# Patient Record
Sex: Female | Born: 1965
Health system: Southern US, Community
[De-identification: ages and names within clinical notes are randomized; demographics above are authoritative.]

## PROBLEM LIST (undated history)

## (undated) DIAGNOSIS — F419 Anxiety disorder, unspecified: Secondary | ICD-10-CM

## (undated) DIAGNOSIS — G4710 Hypersomnia, unspecified: Secondary | ICD-10-CM

## (undated) DIAGNOSIS — M1990 Unspecified osteoarthritis, unspecified site: Secondary | ICD-10-CM

## (undated) DIAGNOSIS — G4733 Obstructive sleep apnea (adult) (pediatric): Secondary | ICD-10-CM

## (undated) DIAGNOSIS — A6920 Lyme disease, unspecified: Secondary | ICD-10-CM

## (undated) DIAGNOSIS — E66813 Obesity, class 3: Secondary | ICD-10-CM

## (undated) DIAGNOSIS — G43909 Migraine, unspecified, not intractable, without status migrainosus: Secondary | ICD-10-CM

## (undated) DIAGNOSIS — I10 Essential (primary) hypertension: Secondary | ICD-10-CM

## (undated) DIAGNOSIS — N289 Disorder of kidney and ureter, unspecified: Secondary | ICD-10-CM

## (undated) HISTORY — DX: Lyme disease, unspecified: A69.20

## (undated) HISTORY — DX: Obstructive sleep apnea (adult) (pediatric): G47.33

## (undated) HISTORY — PX: CHOLECYSTECTOMY: SHX55

## (undated) HISTORY — PX: KNEE SURGERY: SHX244

## (undated) HISTORY — PX: ABLATION ON ENDOMETRIOSIS: SHX5787

## (undated) HISTORY — DX: Hypersomnia, unspecified: G47.10

## (undated) HISTORY — DX: Morbid (severe) obesity due to excess calories: E66.01

## (undated) HISTORY — DX: Unspecified osteoarthritis, unspecified site: M19.90

## (undated) HISTORY — PX: TUBAL LIGATION: SHX77

## (undated) HISTORY — PX: KNEE ARTHROCENTESIS: SUR44

## (undated) HISTORY — DX: Anxiety disorder, unspecified: F41.9

## (undated) HISTORY — DX: Obesity, class 3: E66.813

---

## 2000-01-18 ENCOUNTER — Encounter: Payer: Self-pay | Admitting: Emergency Medicine

## 2000-01-18 ENCOUNTER — Encounter: Payer: Self-pay | Admitting: General Surgery

## 2000-01-18 ENCOUNTER — Encounter (INDEPENDENT_AMBULATORY_CARE_PROVIDER_SITE_OTHER): Payer: Self-pay

## 2000-01-18 ENCOUNTER — Observation Stay (HOSPITAL_COMMUNITY): Admission: EM | Admit: 2000-01-18 | Discharge: 2000-01-19 | Payer: Self-pay | Admitting: Emergency Medicine

## 2000-02-19 ENCOUNTER — Encounter: Payer: Self-pay | Admitting: Emergency Medicine

## 2000-02-19 ENCOUNTER — Emergency Department (HOSPITAL_COMMUNITY): Admission: EM | Admit: 2000-02-19 | Discharge: 2000-02-19 | Payer: Self-pay | Admitting: Emergency Medicine

## 2000-02-24 ENCOUNTER — Encounter: Payer: Self-pay | Admitting: *Deleted

## 2000-02-24 ENCOUNTER — Ambulatory Visit (HOSPITAL_COMMUNITY): Admission: RE | Admit: 2000-02-24 | Discharge: 2000-02-24 | Payer: Self-pay | Admitting: *Deleted

## 2000-07-22 ENCOUNTER — Encounter: Payer: Self-pay | Admitting: Family Medicine

## 2000-07-22 ENCOUNTER — Ambulatory Visit (HOSPITAL_COMMUNITY): Admission: RE | Admit: 2000-07-22 | Discharge: 2000-07-22 | Payer: Self-pay | Admitting: Family Medicine

## 2000-08-17 ENCOUNTER — Emergency Department (HOSPITAL_COMMUNITY): Admission: EM | Admit: 2000-08-17 | Discharge: 2000-08-18 | Payer: Self-pay | Admitting: Emergency Medicine

## 2000-10-21 ENCOUNTER — Encounter: Payer: Self-pay | Admitting: Family Medicine

## 2000-10-21 ENCOUNTER — Ambulatory Visit (HOSPITAL_COMMUNITY): Admission: RE | Admit: 2000-10-21 | Discharge: 2000-10-21 | Payer: Self-pay | Admitting: Family Medicine

## 2001-10-27 ENCOUNTER — Emergency Department (HOSPITAL_COMMUNITY): Admission: EM | Admit: 2001-10-27 | Discharge: 2001-10-28 | Payer: Self-pay | Admitting: Emergency Medicine

## 2002-04-17 ENCOUNTER — Encounter: Payer: Self-pay | Admitting: Emergency Medicine

## 2002-04-17 ENCOUNTER — Emergency Department (HOSPITAL_COMMUNITY): Admission: EM | Admit: 2002-04-17 | Discharge: 2002-04-17 | Payer: Self-pay | Admitting: Emergency Medicine

## 2002-04-24 ENCOUNTER — Encounter: Payer: Self-pay | Admitting: Emergency Medicine

## 2002-04-24 ENCOUNTER — Inpatient Hospital Stay (HOSPITAL_COMMUNITY): Admission: EM | Admit: 2002-04-24 | Discharge: 2002-04-27 | Payer: Self-pay | Admitting: Emergency Medicine

## 2005-12-22 ENCOUNTER — Encounter: Admission: RE | Admit: 2005-12-22 | Discharge: 2005-12-22 | Payer: Self-pay | Admitting: Family Medicine

## 2006-06-18 ENCOUNTER — Emergency Department (HOSPITAL_COMMUNITY): Admission: EM | Admit: 2006-06-18 | Discharge: 2006-06-18 | Payer: Self-pay | Admitting: Emergency Medicine

## 2006-07-11 ENCOUNTER — Ambulatory Visit (HOSPITAL_COMMUNITY): Admission: EM | Admit: 2006-07-11 | Discharge: 2006-07-12 | Payer: Self-pay | Admitting: Obstetrics and Gynecology

## 2007-02-02 ENCOUNTER — Emergency Department (HOSPITAL_COMMUNITY): Admission: EM | Admit: 2007-02-02 | Discharge: 2007-02-02 | Payer: Self-pay | Admitting: Family Medicine

## 2007-02-13 ENCOUNTER — Emergency Department (HOSPITAL_COMMUNITY): Admission: EM | Admit: 2007-02-13 | Discharge: 2007-02-13 | Payer: Self-pay | Admitting: Family Medicine

## 2007-09-24 ENCOUNTER — Emergency Department (HOSPITAL_COMMUNITY): Admission: EM | Admit: 2007-09-24 | Discharge: 2007-09-24 | Payer: Self-pay | Admitting: Emergency Medicine

## 2007-11-04 ENCOUNTER — Emergency Department (HOSPITAL_COMMUNITY): Admission: EM | Admit: 2007-11-04 | Discharge: 2007-11-04 | Payer: Self-pay | Admitting: Family Medicine

## 2007-12-28 ENCOUNTER — Emergency Department (HOSPITAL_COMMUNITY): Admission: EM | Admit: 2007-12-28 | Discharge: 2007-12-28 | Payer: Self-pay | Admitting: Emergency Medicine

## 2008-03-06 ENCOUNTER — Emergency Department (HOSPITAL_COMMUNITY): Admission: EM | Admit: 2008-03-06 | Discharge: 2008-03-06 | Payer: Self-pay | Admitting: Emergency Medicine

## 2008-05-08 ENCOUNTER — Emergency Department (HOSPITAL_COMMUNITY): Admission: EM | Admit: 2008-05-08 | Discharge: 2008-05-08 | Payer: Self-pay | Admitting: Emergency Medicine

## 2008-07-01 ENCOUNTER — Emergency Department (HOSPITAL_COMMUNITY): Admission: EM | Admit: 2008-07-01 | Discharge: 2008-07-01 | Payer: Self-pay | Admitting: Emergency Medicine

## 2008-10-13 ENCOUNTER — Emergency Department (HOSPITAL_COMMUNITY): Admission: EM | Admit: 2008-10-13 | Discharge: 2008-10-13 | Payer: Self-pay | Admitting: Emergency Medicine

## 2008-12-14 ENCOUNTER — Emergency Department (HOSPITAL_COMMUNITY): Admission: EM | Admit: 2008-12-14 | Discharge: 2008-12-14 | Payer: Self-pay | Admitting: Emergency Medicine

## 2009-01-05 ENCOUNTER — Emergency Department (HOSPITAL_COMMUNITY): Admission: EM | Admit: 2009-01-05 | Discharge: 2009-01-06 | Payer: Self-pay | Admitting: Emergency Medicine

## 2009-06-03 ENCOUNTER — Emergency Department (HOSPITAL_BASED_OUTPATIENT_CLINIC_OR_DEPARTMENT_OTHER): Admission: EM | Admit: 2009-06-03 | Discharge: 2009-06-03 | Payer: Self-pay | Admitting: Emergency Medicine

## 2009-06-03 ENCOUNTER — Ambulatory Visit: Payer: Self-pay | Admitting: Diagnostic Radiology

## 2009-07-19 ENCOUNTER — Emergency Department (HOSPITAL_BASED_OUTPATIENT_CLINIC_OR_DEPARTMENT_OTHER): Admission: EM | Admit: 2009-07-19 | Discharge: 2009-07-19 | Payer: Self-pay | Admitting: Emergency Medicine

## 2009-08-05 ENCOUNTER — Emergency Department (HOSPITAL_COMMUNITY): Admission: EM | Admit: 2009-08-05 | Discharge: 2009-08-05 | Payer: Self-pay | Admitting: Emergency Medicine

## 2009-11-05 ENCOUNTER — Emergency Department (HOSPITAL_COMMUNITY): Admission: EM | Admit: 2009-11-05 | Discharge: 2009-11-05 | Payer: Self-pay | Admitting: Emergency Medicine

## 2010-03-28 ENCOUNTER — Emergency Department (HOSPITAL_COMMUNITY): Admission: EM | Admit: 2010-03-28 | Discharge: 2010-03-28 | Payer: Self-pay | Admitting: Emergency Medicine

## 2010-09-01 ENCOUNTER — Emergency Department (HOSPITAL_COMMUNITY)
Admission: EM | Admit: 2010-09-01 | Discharge: 2010-09-01 | Payer: Self-pay | Source: Home / Self Care | Admitting: Family Medicine

## 2010-10-18 ENCOUNTER — Encounter: Payer: Self-pay | Admitting: Family Medicine

## 2010-11-10 ENCOUNTER — Inpatient Hospital Stay (INDEPENDENT_AMBULATORY_CARE_PROVIDER_SITE_OTHER)
Admission: RE | Admit: 2010-11-10 | Discharge: 2010-11-10 | Disposition: A | Discharge status: Home / Self Care | Payer: Self-pay | Source: Ambulatory Visit | Attending: Emergency Medicine | Admitting: Emergency Medicine

## 2010-11-10 DIAGNOSIS — J039 Acute tonsillitis, unspecified: Secondary | ICD-10-CM

## 2010-11-13 ENCOUNTER — Emergency Department (HOSPITAL_COMMUNITY): Payer: Self-pay

## 2010-11-13 ENCOUNTER — Emergency Department (HOSPITAL_COMMUNITY)
Admission: EM | Admit: 2010-11-13 | Discharge: 2010-11-13 | Disposition: A | Discharge status: Home / Self Care | Payer: Self-pay | Attending: Emergency Medicine | Admitting: Emergency Medicine

## 2010-11-13 DIAGNOSIS — R599 Enlarged lymph nodes, unspecified: Secondary | ICD-10-CM | POA: Insufficient documentation

## 2010-11-13 DIAGNOSIS — F3289 Other specified depressive episodes: Secondary | ICD-10-CM | POA: Insufficient documentation

## 2010-11-13 DIAGNOSIS — R1310 Dysphagia, unspecified: Secondary | ICD-10-CM | POA: Insufficient documentation

## 2010-11-13 DIAGNOSIS — J45909 Unspecified asthma, uncomplicated: Secondary | ICD-10-CM | POA: Insufficient documentation

## 2010-11-13 DIAGNOSIS — Z8639 Personal history of other endocrine, nutritional and metabolic disease: Secondary | ICD-10-CM | POA: Insufficient documentation

## 2010-11-13 DIAGNOSIS — E119 Type 2 diabetes mellitus without complications: Secondary | ICD-10-CM | POA: Insufficient documentation

## 2010-11-13 DIAGNOSIS — Z79899 Other long term (current) drug therapy: Secondary | ICD-10-CM | POA: Insufficient documentation

## 2010-11-13 DIAGNOSIS — Z862 Personal history of diseases of the blood and blood-forming organs and certain disorders involving the immune mechanism: Secondary | ICD-10-CM | POA: Insufficient documentation

## 2010-11-13 DIAGNOSIS — M542 Cervicalgia: Secondary | ICD-10-CM | POA: Insufficient documentation

## 2010-11-13 DIAGNOSIS — J029 Acute pharyngitis, unspecified: Secondary | ICD-10-CM | POA: Insufficient documentation

## 2010-11-13 DIAGNOSIS — Z9889 Other specified postprocedural states: Secondary | ICD-10-CM | POA: Insufficient documentation

## 2010-11-13 DIAGNOSIS — I10 Essential (primary) hypertension: Secondary | ICD-10-CM | POA: Insufficient documentation

## 2010-11-13 DIAGNOSIS — F329 Major depressive disorder, single episode, unspecified: Secondary | ICD-10-CM | POA: Insufficient documentation

## 2010-11-13 LAB — BASIC METABOLIC PANEL
CO2: 26 mEq/L (ref 19–32)
Calcium: 9.3 mg/dL (ref 8.4–10.5)
Chloride: 102 mEq/L (ref 96–112)
Creatinine, Ser: 0.81 mg/dL (ref 0.4–1.2)
GFR calc Af Amer: >60 mL/min (ref >60)
Glucose, Bld: 156 mg/dL — ABNORMAL HIGH (ref 70–99)

## 2010-11-13 LAB — DIFFERENTIAL
Basophils Relative: 0 % (ref 0–1)
Lymphocytes Relative: 25 % (ref 12–46)
Lymphs Abs: 3.3 10*3/uL (ref 0.7–4.0)
Monocytes Absolute: 0.9 10*3/uL (ref 0.1–1.0)
Monocytes Relative: 7 % (ref 3–12)
Neutro Abs: 8.4 10*3/uL — ABNORMAL HIGH (ref 1.7–7.7)
Neutrophils Relative %: 65 % (ref 43–77)

## 2010-11-13 LAB — CBC
HCT: 41.6 % (ref 36.0–46.0)
Hemoglobin: 13.7 g/dL (ref 12.0–15.0)
MCH: 29.8 pg (ref 26.0–34.0)
MCHC: 32.9 g/dL (ref 30.0–36.0)
MCV: 90.4 fL (ref 78.0–100.0)
RBC: 4.6 MIL/uL (ref 3.87–5.11)

## 2010-11-13 MED ORDER — IOHEXOL 300 MG/ML  SOLN
100.0000 mL | Freq: Once | INTRAMUSCULAR | Status: AC | PRN
  Administered 2010-11-13: 100 mL via INTRAVENOUS

## 2010-12-11 ENCOUNTER — Inpatient Hospital Stay (INDEPENDENT_AMBULATORY_CARE_PROVIDER_SITE_OTHER)
Admission: RE | Admit: 2010-12-11 | Discharge: 2010-12-11 | Disposition: A | Discharge status: Home / Self Care | Payer: Self-pay | Source: Ambulatory Visit | Attending: Family Medicine | Admitting: Family Medicine

## 2010-12-11 DIAGNOSIS — E119 Type 2 diabetes mellitus without complications: Secondary | ICD-10-CM

## 2010-12-11 DIAGNOSIS — J309 Allergic rhinitis, unspecified: Secondary | ICD-10-CM

## 2010-12-11 LAB — GLUCOSE, CAPILLARY

## 2010-12-13 LAB — POCT I-STAT, CHEM 8
BUN: 14 mg/dL (ref 6–23)
Creatinine, Ser: 0.9 mg/dL (ref 0.4–1.2)
Glucose, Bld: 108 mg/dL — ABNORMAL HIGH (ref 70–99)
Potassium: 4 mEq/L (ref 3.5–5.1)
Sodium: 140 mEq/L (ref 135–145)

## 2010-12-13 LAB — POCT URINALYSIS DIP (DEVICE)
Hgb urine dipstick: NEGATIVE
Ketones, ur: NEGATIVE mg/dL
Protein, ur: NEGATIVE mg/dL
Specific Gravity, Urine: 1.02 (ref 1.005–1.030)
Urobilinogen, UA: 0.2 mg/dL (ref 0.0–1.0)

## 2011-01-01 LAB — CBC
Hemoglobin: 13.2 g/dL (ref 12.0–15.0)
RBC: 4.21 MIL/uL (ref 3.87–5.11)
WBC: 12.9 10*3/uL — ABNORMAL HIGH (ref 4.0–10.5)

## 2011-01-01 LAB — DIFFERENTIAL
Lymphs Abs: 3.7 10*3/uL (ref 0.7–4.0)
Monocytes Relative: 5 % (ref 3–12)
Neutro Abs: 7.8 10*3/uL — ABNORMAL HIGH (ref 1.7–7.7)
Neutrophils Relative %: 61 % (ref 43–77)

## 2011-01-01 LAB — URINALYSIS, ROUTINE W REFLEX MICROSCOPIC
Bilirubin Urine: NEGATIVE
Glucose, UA: NEGATIVE mg/dL
Hgb urine dipstick: NEGATIVE
Specific Gravity, Urine: 1.025 (ref 1.005–1.030)

## 2011-01-01 LAB — BASIC METABOLIC PANEL
CO2: 28 mEq/L (ref 19–32)
Chloride: 103 mEq/L (ref 96–112)
GFR calc Af Amer: >60 mL/min (ref >60)
Potassium: 3.6 mEq/L (ref 3.5–5.1)
Sodium: 140 mEq/L (ref 135–145)

## 2011-01-01 LAB — PREGNANCY, URINE: Preg Test, Ur: NEGATIVE

## 2011-01-11 LAB — GLUCOSE, CAPILLARY: Glucose-Capillary: 134 mg/dL — ABNORMAL HIGH (ref 70–99)

## 2011-02-12 NOTE — Consult Note (Signed)
[name], [name]                    ACCOUNT NO.:  [id number]   MEDICAL RECORD NO.:  [id number]                   PATIENT TYPE:  OUT   LOCATION:  CATH                                 FACILITY:  [facility]   PHYSICIAN:  Clovis Pu. Patty Sermons, M.D.           DATE OF BIRTH:  [birth year]   DATE OF CONSULTATION:  04/25/2002  DATE OF DISCHARGE:                              CARDIOLOGY CONSULTATION   HISTORY:  This is a pleasant 45 year old married Caucasian woman, who is  admitted with left chest pain radiating down the left arm.  She came into  the emergency room  yesterday afternoon.  She does not have any known  coronary disease, but does have multiple risk factors.  She has been on high  blood pressure medicine from Meredith Staggers, M.D. and Velna Hatchet,  M.D.  At the present time, she is on atenolol 50 mg daily, Lasix 20 mg  daily, K-Dur 10 mEq daily, and is also on Prozac 20 mg b.i.d.  A week ago on  July 25 initially seen in the emergency room for chest pain and her  electrocardiogram was normal and she was discharged home.  She felt too bad  to return to work and has remained resting at home this past week.  She came  back yesterday because of worsening chest pain and left arm pain.  She had  mild diaphoresis and nausea, but no vomiting.  Pain radiates to the left  arm.  In the emergency room, she was given a GI cocktail with no response,  but she states, that when given a sublingual nitroglycerine, there was  partial improvement of the pain.   FAMILY HISTORY:  Strongly positive for premature coronary disease.  She has  a sister, who had her first bypass at age 63 and a redo at age 71.  Her  father had a heart attack at age 32 and died at 36.  A brother had a MI and  coronary bypass grafting at age 79.  There is diabetes on both sides of the  family.   PAST MEDICAL HISTORY:  Reveals a history of chronic obesity and she has lost  from 250 to 220 pounds through dieting and  increased exercise.   SOCIAL HISTORY:  Revealed that she has worked at [employer]  for more than 20 years.  She washes cars and moves cars around, and also  does occasional sales work.  She smokes about three cigarettes a day.  She  rarely drinks any alcohol and she is married.   REVIEW OF SYSTEMS:  Negative, except for present illness and as above.   PHYSICAL EXAMINATION:  VITAL SIGNS:  Blood pressure is 126/80, pulse is 50  and regular, respirations are normal.  Weight is 219.  HEENT:  Pupils equal, round, and reactive to light and accommodation.  Funduscopic not examined. Jugular venous pressure normal, carotids normal.  SKIN:  Clear.  LUNGS:  Clear.  HEART:  No murmur, gallop, rub or click.  No chest wall tenderness.  ABDOMEN:  Soft, nontender, no mass.  EXTREMITIES:  Reveal good peripheral pulses, no edema.   CHEST X-RAY:  Normal.   ELECTROCARDIOGRAM:  Normal.  Cardiac enzymes are negative x 3.   IMPRESSION:  1. Chest pain suggestive of angina pectoris without any evidence for     myocardial damage yet.  2. Multiple risk factors include hypertension, strong family history of     premature coronary disease, questionable elevated lipid status, smoking,     and obesity.   RECOMMENDATIONS:  I do not believe that noninvasive imaging, such as  Cardiolite would be as accurate in her situation because of her body  habitus.  I have recommended pursuing with cardiac catheterization and will  try to get her on the catheterization schedule tomorrow with Dr. Swaziland or  Tresa Endo.                                               Thomas A. Patty Sermons, M.D.    TAB/MEDQ  D:  04/26/2002  T:  04/27/2002  Job:  [id number]   cc:   Gracelyn Nurse, M.D.

## 2011-02-12 NOTE — H&P (Signed)
[name], [name]              ACCOUNT NO.:  [id number]   MEDICAL RECORD NO.:  [id number]          PATIENT TYPE:  INP   LOCATION:  1404                         FACILITY:  [facility]   PHYSICIAN:  Corinna L. Lendell Caprice, MDDATE OF BIRTH:  March 05, 1966   DATE OF ADMISSION:  07/11/2006  DATE OF DISCHARGE:  07/12/2006                                HISTORY & PHYSICAL   CHIEF COMPLAINT:  Right flank and right lower quadrant pain.   HISTORY OF PRESENT ILLNESS:  [name] is a 45 year old unassigned white  female who presents to the emergency room earlier this morning with  complaints of waking up with severe right-sided back, flank and right lower  quadrant pain.  She reports having had a kidney stone in the past.  She had  a noncontrast CT of the abdomen and pelvis and a subsequent contrast CT of  the abdomen and pelvis which were normal, but continues to have severe right  lower quadrant pain.  Her back pain is improved.  She has since developed  nausea, vomiting and a migraine headache.  She has received multiple doses  of Dilaudid here with minimal relief.  She reports no history of previous  similar episodes.  Prior to getting the Dilaudid, she had no problems with  nausea, but she reports hallucinations and/or nightmares with morphine.   PAST MEDICAL HISTORY:  1. Cervical cancer.  2. Endometriosis.  3. Morbid obesity.  4. History of migraines.  5. Depression.  6. Panic attacks.   MEDICATIONS:  1. Lasix 20 mg a day.  2. KCl 20 mEq a day.  3. Ativan as needed for panic attacks.  4. Prozac 20 mg a day.  5. Vicodin as needed for migraine.   ALLERGIES:  DARVOCET.   SOCIAL HISTORY:  She does not drink, smoke or use drugs.   FAMILY HISTORY:  Her sister had early coronary artery disease, and her  father died in his 24s of an MI.  Her sister has ovarian cancer.   REVIEW OF SYSTEMS:  Otherwise negative.   PHYSICAL EXAMINATION:  VITAL SIGNS:  Her temperature is 97.4, blood  pressure  147/90, pulse 82, respiratory rate 22, oxygen saturation 97% on room air.  GENERAL:  The patient is an uncomfortable-appearing white female with a cold  compress over her face in a darkened room.  HEENT:  Pupils equal, round, reactive to light.  Moist mucous membranes.  NECK:  Supple.  No thyromegaly.  LUNGS:  Clear to auscultation bilaterally without wheezes, rhonchi or rales.  ABDOMEN:  Soft, minimal lower abdominal tenderness.  No rebound tenderness.  BACK:  She has no CVA tenderness, no point tenderness along her spine.  EXTREMITIES:  No clubbing, cyanosis or edema.  SKIN:  No rash.  PSYCHIATRIC:  The patient is comfortable but calm and cooperative.  NEUROLOGIC:  Alert and oriented.  Cranial nerves and sensorimotor exam are  intact.   LABORATORY DATA:  Her white blood cell count is 11.9.  CBC otherwise  unremarkable.  Complete metabolic panel significant for a glucose of 168;  otherwise unremarkable.  Lipase 34.  Urine pregnancy  negative.  UA shows  negative blood, negative nitrite, negative leukocyte esterase.  CT of the  abdomen and pelvis showed diffuse fatty infiltration of the liver, small  umbilical hernia containing fat, bilateral ovarian cysts.  Left ovarian cyst  is likely hemorrhagic.  Approximately 2 cm right urine fundal fibroid.   ASSESSMENT AND PLAN:  1. Right back and flank pain.  The etiology is uncertain.  This may be a      ruptured cyst or musculoskeletal.  She will be placed on observation      for pain control.  She will get Dilaudid as needed.  2. Nausea, most likely secondary to pain medication.  3. Migraine.  The patient reports that triptans have not helped in the      past.  For right now, she will just get supportive care.      Corinna L. Lendell Caprice, MD  Electronically Signed     CLS/MEDQ  D:  07/11/2006  T:  07/12/2006  Job:  [id number]

## 2011-02-12 NOTE — Cardiovascular Report (Signed)
   NAMELORENNA, [name] NO.:  [id number]   MEDICAL RECORD NO.:  [id number]                    PATIENT TYPE:   LOCATION:                                       FACILITY:   PHYSICIAN:  Peter M. Swaziland, M.D.               DATE OF BIRTH:   DATE OF PROCEDURE:  DATE OF DISCHARGE:                              CARDIAC CATHETERIZATION   INDICATIONS FOR PROCEDURE:  The patient is a 45 year old white female with a  history of morbid obesity, tobacco abuse, hypertension, and hyperlipidemia.  She presents with symptoms of angina pectoris.   ACCESS:  Access was via the right femoral artery using standard Seldinger  technique.   EQUIPMENT:  6 French 4 cm right and left Judkins catheters, 6 French pigtail  catheter, and 6 French arterial sheath.   MEDICATIONS:  Local anesthesia with 1% Xylocaine.   CONTRAST:  120 cc of Omnipaque.   HEMODYNAMIC DATA:  The aortic pressure was 113/77 with a mean of 93.  The  left ventricular pressure was 113 with an EDP of 23 mmHg.   ANGIOGRAPHIC DATA:  The left coronary artery arises and distributes  normally.  The left main coronary artery is normal.   The left anterior descending artery is normal.  It gives rise to a large  first diagonal branch which is also normal.   The left circumflex coronary artery gives rise to a very large branching  marginal vessel and is normal.   The right coronary artery arises and distributes normally and is a normal  vessel.   Left ventricular angiography performed in the RAO view demonstrates normal  left ventricular size and contractility with normal systolic function.  The  ejection fraction is estimated at 65-70%.  There is a small left ventricular  diverticulum on the anterior wall.  There is no mitral regurgitation or  prolapse.    FINAL INTERPRETATION:  1. Normal coronary anatomy.  2. Normal left ventricular function.                                               Peter M.  Swaziland, M.D.    PMJ/MEDQ  D:  04/29/2002  T:  05/04/2002  Job:  [id number]   cc:   Thomas A. Patty Sermons, M.D.   Gracelyn Nurse, M.D.

## 2011-02-12 NOTE — Discharge Summary (Signed)
[name], [name]                    ACCOUNT NO.:  [id number]   MEDICAL RECORD NO.:  [id number]                   PATIENT TYPE:  INP   LOCATION:  0356                                 [facility]ACILITY[facility]   PHYSICIAN:  Jackie Plum, M.D.             DATE OF BIRTH:  26-Sep-1966   DATE OF ADMISSION:  04/24/2002  DATE OF DISCHARGE:  04/27/2002                                 DISCHARGE SUMMARY   DISCHARGE DIAGNOSES:  1. Chest pain, resolved.  No evidence of cardiac etiology.  Probably     gastrointestinal in etiology.     A. Cardiac catheterization done by Dr. Swaziland on 04/26/02, notable for        normal coronaries with normal left ventricular function.     B. Chest CT done on 04/17/02, notable for absence of any pulmonary        embolus, no evidence of axillary, mediastinal, or hilar        lymphadenopathy.  Heart size within normal limits.  No evidence of        pericardial or pleural effusions.  Lung windows revealed no evidence        for focal consolidation, parenchyma, mass, or empyema.  2. History of hypertension.  3. History of depression.  4. Hypercholesterolemia.     A. Total cholesterol 272 with decreased HDL of 31, and LDL increased at        160.  5. Obesity.  6. History of cigarette smoking.  7. Status post bilateral tubal ligation and appendectomy.  8. History of noncompliance to office appointments leading to discharge from     all [facility] practices.  9. Drug abuse.     A. Drug screen was positive for cannabinoids.   DISCHARGE MEDICATIONS:  The patient is to resume all her preadmission  medications, which include Lasix 20 mg p.o. q.d., K-Dur 10 mEq p.o. q.d.,  Ambien p.r.n., Prozac 20 mg b.i.d.  The following adjustments have been made  in the patient's medications:  Atenolol has been decreased from 15 mg p.o.  q.d. to 25 mg p.o. q.d. on account of bradycardia during hospitalization.  The following medications are new:   Zocor 20 mg p.o. q.d., Protonix 40 mg  p.o. q.d., and Vicodin one tablet q.6 h.  p.r.n. for pain.   ACTIVITY:  As tolerated.   DIET:  Low-salt, low-fat diet.   DISCHARGE INSTRUCTIONS:  The patient is advised to stop smoking.  She is to  report to M.D. or emergency room if she experiences any chest pain,  shortness of breath, or dizziness.   FOLLOWUP:  The patient is to register for further evaluation and followup at  [facility].  She is to go there with her pay stub and a picture  ID as soon as possible within two weeks.   CONSULTATIONS:  Peter M. Swaziland, M.D. of cardiology.   PROCEDURE:  Cardiac catheterization on 04/26/02,  with results as noted above.   CONDITION ON DISCHARGE:  Improved and stable.   LABORATORY DATA:  WBC count 8.2, hemoglobin 12.2, hematocrit 34.3, MCV 27.6,  platelet count 290.  Sodium 139, potassium 3.9, chloride 106, CO2 30,  glucose 102, BUN 30, creatinine 1.1, calcium 8.7.  Drug abuse screen was  positive for cannabinoids by amino acids.   HISTORY OF PRESENT ILLNESS:  The patient was admitted on 04/24/02, after  presenting with left-sided chest pain radiating down her left arm.  The  patient had presented to the ED about one week prior to this admission with  similar complaints, for which a chest CT was done and was negative for PE or  any acute thoracic or pulmonary process.  The patient's chest pain did not  improve with GI cocktail, but did improve with sublingual nitroglycerin.  The patient was hemodynamically stable on admission, and her cardiopulmonary  exam was unremarkable.  There was no reproducible pain on palpation of the  chest.  On account of this, the patient was admitted to the hospitalist  service for further evaluation.   HOSPITAL COURSE:  Problem #1 - Chest pain:  The patient was admitted to a  telemetry bed, and myocardial infarction was ruled out by serial cardiac  enzymes and EKGs.  Telemetry monitoring was unremarkable  except for  bradycardia in the 40s, which we believe was due to her medication  (atenolol), which was subsequently decreased to half the dose of 25 mg p.o.  q.d.  Lipid studies were obtained, and the results are as noted above.  On  account of a strong family history and cardiac risk factors, cardiology was  consulted for further evaluation.  The patient gives a history of premature  CAD in family.  Her sister had CABG at age 34, her father had MI at age 76  and died at age 62, brother had MI and CABG at age 83, and there is a strong  family history of diabetes on both sides of the family.  On account of her  chest pain which was suggestive of angina pectoris, and multiple risk  factors for CAD, including hypertension, strong family history, morbid  obesity, and hypercholesterolemia, it was thought prudent to perform cardiac  cath in view of noninvasive imaging (Cardiolite, which may be less accurate  in view of her body habitus).  Cardiac cath results were unremarkable, as  noted above, the patient is currently chest pain free.  She has been started  on Zocor in view of her low HDL with hypercholesterolemia, and Protonix for  probable GI source of her symptomatology.   The patient has had cardiac cath as mentioned above.  Chest CT was  unremarkable.  The cause of the patient's chest pain is clearly noncardiac.  The patient experienced significant chest pains, noncardiac causes including  GI should be looked at and will not need any further cardiac evaluation.   Problem #2 - Hypercholesterolemia:  The patient's lipid panel is as noted  above.  We treated her with Zocor in view of her low HDL, and  hypercholesterolemia with a LDL of 160.  The patient will need outpatient  checks of her total CPK and liver function.   Problem #3 - Hypertension:  The patient was taking atenolol 50 mg p.o. q.d. for hypertension.  During hospitalization her heart rates were in the 40s  and, therefore, we  decreased the dose to 25 mg p.o. q.d.  Blood pressures,  however, still were  controlled on discharge.   Problem #4 - Cigarette smoking and Marijuana smoking:  The patient denies  history of using Marijuana; however, her drug screen was positive for  cannabinoids.  We have counseled her strongly regarding this drug use, and  she expresses understanding.  We will commend outpatient reinforce  counseling in this regard.   As noted above, the patient is noncompliant to her office appointments, and  has been discharged from all [facility] practices.  She  is, therefore, going to follow up with [facility].                                               Jackie Plum, M.D.    GO/MEDQ  D:  04/27/2002  T:  05/03/2002  Job:  [id number]   cc:   [facility]   Dr. [facility]

## 2011-05-24 ENCOUNTER — Inpatient Hospital Stay (INDEPENDENT_AMBULATORY_CARE_PROVIDER_SITE_OTHER)
Admission: RE | Admit: 2011-05-24 | Discharge: 2011-05-24 | Disposition: A | Discharge status: Home / Self Care | Payer: Self-pay | Source: Ambulatory Visit | Attending: Family Medicine | Admitting: Family Medicine

## 2011-05-24 DIAGNOSIS — D179 Benign lipomatous neoplasm, unspecified: Secondary | ICD-10-CM

## 2011-05-24 DIAGNOSIS — R51 Headache: Secondary | ICD-10-CM

## 2011-05-24 DIAGNOSIS — L723 Sebaceous cyst: Secondary | ICD-10-CM

## 2011-05-27 ENCOUNTER — Emergency Department (HOSPITAL_COMMUNITY)
Admission: EM | Admit: 2011-05-27 | Discharge: 2011-05-27 | Disposition: A | Discharge status: Home / Self Care | Payer: Self-pay | Attending: Emergency Medicine | Admitting: Emergency Medicine

## 2011-05-27 DIAGNOSIS — L0211 Cutaneous abscess of neck: Secondary | ICD-10-CM | POA: Insufficient documentation

## 2011-05-27 DIAGNOSIS — R509 Fever, unspecified: Secondary | ICD-10-CM | POA: Insufficient documentation

## 2011-05-27 DIAGNOSIS — I10 Essential (primary) hypertension: Secondary | ICD-10-CM | POA: Insufficient documentation

## 2011-05-27 DIAGNOSIS — E119 Type 2 diabetes mellitus without complications: Secondary | ICD-10-CM | POA: Insufficient documentation

## 2011-05-27 DIAGNOSIS — R229 Localized swelling, mass and lump, unspecified: Secondary | ICD-10-CM | POA: Insufficient documentation

## 2011-05-29 ENCOUNTER — Encounter: Payer: Self-pay | Admitting: *Deleted

## 2011-05-29 ENCOUNTER — Emergency Department (HOSPITAL_BASED_OUTPATIENT_CLINIC_OR_DEPARTMENT_OTHER)
Admission: EM | Admit: 2011-05-29 | Discharge: 2011-05-29 | Disposition: A | Discharge status: Home / Self Care | Payer: Self-pay | Attending: Emergency Medicine | Admitting: Emergency Medicine

## 2011-05-29 DIAGNOSIS — E119 Type 2 diabetes mellitus without complications: Secondary | ICD-10-CM | POA: Insufficient documentation

## 2011-05-29 DIAGNOSIS — Z4800 Encounter for change or removal of nonsurgical wound dressing: Secondary | ICD-10-CM | POA: Insufficient documentation

## 2011-05-29 DIAGNOSIS — L0291 Cutaneous abscess, unspecified: Secondary | ICD-10-CM

## 2011-05-29 DIAGNOSIS — F17200 Nicotine dependence, unspecified, uncomplicated: Secondary | ICD-10-CM | POA: Insufficient documentation

## 2011-05-29 DIAGNOSIS — L0390 Cellulitis, unspecified: Secondary | ICD-10-CM

## 2011-05-29 LAB — URINALYSIS, ROUTINE W REFLEX MICROSCOPIC
Leukocytes, UA: NEGATIVE
Nitrite: NEGATIVE
Specific Gravity, Urine: 1.017 (ref 1.005–1.030)
Urobilinogen, UA: 1 mg/dL (ref 0.0–1.0)

## 2011-05-29 MED ORDER — SULFAMETHOXAZOLE-TRIMETHOPRIM 800-160 MG PO TABS: 1.0000 | ORAL_TABLET | Freq: Two times a day (BID) | ORAL | Status: AC

## 2011-05-29 MED ORDER — HYDROCODONE-ACETAMINOPHEN 5-500 MG PO TABS: 1.0000 | ORAL_TABLET | Freq: Four times a day (QID) | ORAL | Status: DC | PRN

## 2011-05-29 NOTE — ED Notes (Signed)
Pt states she was seen at [facility] on Thursday. Here for repacking today. Still having pain. No mention of MRSA

## 2011-05-29 NOTE — ED Provider Notes (Signed)
Scribed for Dr. Judd Lien, the patient was seen in room 08. This chart was scribed by Hillery Hunter. This patient's care was started at 17:40.   History   CSN: [id number] Arrival date & time: 05/29/2011  5:25 PM  Chief Complaint  Patient presents with  . Wound Check   The history is provided by the patient.    [name] is a 45 y.o. female who presents to the Emergency Department for I&D packing removal / wound recheck. She reports that she has had a cyst at her posterior neck for about one year that became inflamed and painful about one week ago. She went to an urgent care at that time and was given Doxycycline which she continues to take as prescribed. Two days ago she went to [place] and had the abscess drained. She is following up now for scheduled 48 hour packing removal. She also reports some right low back pain and a U/A was ordered in triage.    Past Medical History  Diagnosis Date  . Diabetes mellitus   . Endometriosis     Past Surgical History  Procedure Date  . Cholecystectomy   . Knee surgery     History reviewed. No pertinent family history.  History  Substance Use Topics  . Smoking status: Current Everyday Smoker  . Smokeless tobacco: Not on file  . Alcohol Use: Yes    Review of Systems  Constitutional: Negative for fever.  Respiratory: Negative for shortness of breath.   Cardiovascular: Negative for chest pain.  Genitourinary: Negative for dysuria and frequency.  Musculoskeletal: Positive for back pain.  All other systems reviewed and are negative.    Physical Exam  BP 171/99  Pulse 92  Temp(Src) 98.5 F (36.9 C) (Oral)  Resp 18  Ht 5' 1.5" (1.562 m)  Wt 275 lb (124.739 kg)  BMI 51.12 kg/m2  SpO2 99%  LMP 04/28/2011  Physical Exam  Nursing note and vitals reviewed. Constitutional: She is oriented to person, place, and time. She appears well-developed and well-nourished.  HENT:  Head: Normocephalic.  Eyes: EOM are normal.  Neck:  Neck supple.       Posterior neck: Small 0.5cm incision with packing in place with mild purulent drainage on packing, no significant cellulitis of the surrounding skin  Pulmonary/Chest: Effort normal.  Musculoskeletal: Normal range of motion.  Neurological: She is alert and oriented to person, place, and time.  Skin: Skin is warm and dry.  Psychiatric: She has a normal mood and affect.    ED Course  Procedures  OTHER DATA REVIEWED: Nursing notes, vital signs, and past medical records reviewed.   DIAGNOSTIC STUDIES: Oxygen Saturation is 99% on room air, normal by my interpretation.    LABS / RADIOLOGY: Results for orders placed during the hospital encounter of 05/29/11  URINALYSIS, ROUTINE W REFLEX MICROSCOPIC      Component Value Range   Color, Urine YELLOW  YELLOW    Appearance CLEAR  CLEAR    Specific Gravity, Urine 1.017  1.005 - 1.030    pH 7.5  5.0 - 8.0    Glucose, UA NEGATIVE  NEGATIVE (mg/dL)   Hgb urine dipstick NEGATIVE  NEGATIVE    Bilirubin Urine NEGATIVE  NEGATIVE    Ketones, ur NEGATIVE  NEGATIVE (mg/dL)   Protein, ur NEGATIVE  NEGATIVE (mg/dL)   Urobilinogen, UA 1.0  0.0 - 1.0 (mg/dL)   Nitrite NEGATIVE  NEGATIVE    Leukocytes, UA NEGATIVE  NEGATIVE      ED  COURSE / COORDINATION OF CARE: 17:50. Packing removed, patient tolerated well. Added Bactrim to ABX and discharged patient.  MDM: Will add coverage for mrsa.  Needs more pain meds.  IMPRESSION: Diagnoses that have been ruled out:  Diagnoses that are still under consideration:  Final diagnoses:  Cellulitis and abscess of unspecified site    PLAN:  Discharge home The patient is to return the emergency department if there is any worsening of symptoms. I have reviewed the discharge instructions with the patient  CONDITION ON DISCHARGE: Stable  DISCHARGE MEDICATIONS: New Prescriptions   HYDROCODONE-ACETAMINOPHEN (VICODIN) 5-500 MG PER TABLET    Take 1-2 tablets by mouth every 6 (six) hours as  needed for pain.   SULFAMETHOXAZOLE-TRIMETHOPRIM (SEPTRA DS) 800-160 MG PER TABLET    Take 1 tablet by mouth 2 (two) times daily.    SCRIBE ATTESTATION: I personally performed the services described in this documentation, which was scribed in my presence. The recorded information has been reviewed and considered. No att. providers found      Geoffery Lyons, MD 05/29/11 2013

## 2011-05-29 NOTE — ED Notes (Signed)
Pt has appropraite wound healing to back of neck drsg intact with wicking/also reports flank pain x 1 week

## 2011-06-03 ENCOUNTER — Ambulatory Visit (INDEPENDENT_AMBULATORY_CARE_PROVIDER_SITE_OTHER): Payer: Self-pay | Admitting: Surgery

## 2011-06-22 LAB — BASIC METABOLIC PANEL
BUN: 20
Chloride: 108
Potassium: 3.6

## 2011-06-22 LAB — URINALYSIS, ROUTINE W REFLEX MICROSCOPIC
Bilirubin Urine: NEGATIVE
Glucose, UA: NEGATIVE
Ketones, ur: NEGATIVE
Leukocytes, UA: NEGATIVE
Nitrite: NEGATIVE
Protein, ur: NEGATIVE
Specific Gravity, Urine: 1.036 — ABNORMAL HIGH
Urobilinogen, UA: 0.2
pH: 6

## 2011-06-22 LAB — URINE MICROSCOPIC-ADD ON

## 2011-06-22 LAB — PREGNANCY, URINE: Preg Test, Ur: NEGATIVE

## 2011-06-24 LAB — D-DIMER, QUANTITATIVE: D-Dimer, Quant: 0.36

## 2011-06-25 LAB — POCT URINALYSIS DIP (DEVICE)
Glucose, UA: NEGATIVE
Hgb urine dipstick: NEGATIVE
Nitrite: NEGATIVE

## 2011-06-28 LAB — POCT I-STAT, CHEM 8
BUN: 14
Calcium, Ion: 1.27
Chloride: 106
Creatinine, Ser: 0.9
Glucose, Bld: 117 — ABNORMAL HIGH
HCT: 41
Hemoglobin: 13.9
Potassium: 3.7
Sodium: 141
TCO2: 28

## 2011-06-28 LAB — URINALYSIS, ROUTINE W REFLEX MICROSCOPIC
Nitrite: NEGATIVE
Specific Gravity, Urine: 1.023
Urobilinogen, UA: 1

## 2011-07-02 LAB — I-STAT 8, (EC8 V) (CONVERTED LAB)
Acid-Base Excess: 3 — ABNORMAL HIGH
Bicarbonate: 26 — ABNORMAL HIGH
Hemoglobin: 15.3 — ABNORMAL HIGH
Potassium: 3.9
Sodium: 136
TCO2: 27

## 2011-11-14 ENCOUNTER — Emergency Department (HOSPITAL_BASED_OUTPATIENT_CLINIC_OR_DEPARTMENT_OTHER)
Admission: EM | Admit: 2011-11-14 | Discharge: 2011-11-14 | Disposition: A | Discharge status: Home / Self Care | Payer: Self-pay | Attending: Emergency Medicine | Admitting: Emergency Medicine

## 2011-11-14 ENCOUNTER — Encounter (HOSPITAL_BASED_OUTPATIENT_CLINIC_OR_DEPARTMENT_OTHER): Payer: Self-pay | Admitting: *Deleted

## 2011-11-14 DIAGNOSIS — I10 Essential (primary) hypertension: Secondary | ICD-10-CM | POA: Insufficient documentation

## 2011-11-14 DIAGNOSIS — J019 Acute sinusitis, unspecified: Secondary | ICD-10-CM | POA: Insufficient documentation

## 2011-11-14 DIAGNOSIS — E119 Type 2 diabetes mellitus without complications: Secondary | ICD-10-CM | POA: Insufficient documentation

## 2011-11-14 DIAGNOSIS — R51 Headache: Secondary | ICD-10-CM | POA: Insufficient documentation

## 2011-11-14 HISTORY — DX: Essential (primary) hypertension: I10

## 2011-11-14 MED ORDER — FEXOFENADINE-PSEUDOEPHED ER 60-120 MG PO TB12: 1.0000 | ORAL_TABLET | Freq: Two times a day (BID) | ORAL | Status: DC

## 2011-11-14 MED ORDER — AZITHROMYCIN 250 MG PO TABS: 250.0000 mg | ORAL_TABLET | Freq: Every day | ORAL | Status: AC

## 2011-11-14 MED ORDER — OXYCODONE-ACETAMINOPHEN 5-325 MG PO TABS: 1.0000 | ORAL_TABLET | ORAL | Status: AC | PRN

## 2011-11-14 MED ORDER — HYDROMORPHONE HCL PF 1 MG/ML IJ SOLN
1.0000 mg | Freq: Once | INTRAMUSCULAR | Status: AC
  Administered 2011-11-14: 1 mg via INTRAMUSCULAR
  Filled 2011-11-14: qty 1

## 2011-11-14 MED ORDER — IBUPROFEN 400 MG PO TABS
400.0000 mg | ORAL_TABLET | Freq: Once | ORAL | Status: AC
  Administered 2011-11-14: 400 mg via ORAL
  Filled 2011-11-14: qty 1

## 2011-11-14 NOTE — ED Notes (Signed)
Pt states she has problems with her sinuses and this feels like one. Draining down throat. Coughing at times. Taking Zyrtec and Alka-Seltzer Plus without relief.

## 2011-11-14 NOTE — Discharge Instructions (Signed)
Sinusitis Sinuses are air pockets within the bones of your face. The growth of bacteria within a sinus leads to infection. The infection prevents the sinuses from draining. This infection is called sinusitis. SYMPTOMS  There will be different areas of pain depending on which sinuses have become infected.  The maxillary sinuses often produce pain beneath the eyes.   Frontal sinusitis may cause pain in the middle of the forehead and above the eyes.  Other problems (symptoms) include:  Toothaches.   Colored, pus-like (purulent) drainage from the nose.   Swelling, warmth, and tenderness over the sinus areas may be signs of infection.  TREATMENT  Sinusitis is most often determined by an exam.X-rays may be taken. If x-rays have been taken, make sure you obtain your results or find out how you are to obtain them. Your caregiver may give you medications (antibiotics). These are medications that will help kill the bacteria causing the infection. You may also be given a medication (decongestant) that helps to reduce sinus swelling.  HOME CARE INSTRUCTIONS   Only take over-the-counter or prescription medicines for pain, discomfort, or fever as directed by your caregiver.   Drink extra fluids. Fluids help thin the mucus so your sinuses can drain more easily.   Applying either moist heat or ice packs to the sinus areas may help relieve discomfort.   Use saline nasal sprays to help moisten your sinuses. The sprays can be found at your local drugstore.  SEEK IMMEDIATE MEDICAL CARE IF:  You have a fever.   You have increasing pain, severe headaches, or toothache.   You have nausea, vomiting, or drowsiness.   You develop unusual swelling around the face or trouble seeing.  MAKE SURE YOU:   Understand these instructions.   Will watch your condition.   Will get help right away if you are not doing well or get worse.  Document Released: 09/13/2005 Document Revised: 05/26/2011 Document Reviewed:  04/12/2007 [facility] Patient Information 2012 [place], [place].  RESOURCE GUIDE  Dental Problems  Patients with Medicaid: [facility] [phone] [address]                                           [phone] [address] Phone:  [phone]                                                  Phone:  [phone]  If unable to pay or uninsured, contact:  [facility] or [facility]. to become qualified for the adult dental clinic.  Chronic Pain Problems Contact [facility]  [phone] Patients need to be referred by their primary care doctor.  Insufficient Money for Medicine Contact [employer]:  call "211" or [facility] [phone].  No Primary Care Doctor Call [facility]  [phone] Other agencies that provide inexpensive medical care    [facility]  [phone]    [facility]  [phone]    [facility]  [phone]    [facility]  [phone]    [facility]  [phone]  [facility]  [phone]  Psychological Services [facility]  [phone] [facility]  [phone] [facility]   [phone] (emergency services [phone])  Substance Abuse Resources [facility]  [phone] [facility] [phone] [facility] [phone] Floydene Flock [phone] Residential & Outpatient Substance Abuse Program  [phone]  Abuse/Neglect [facility] Child Abuse Hotline [phone] [facility] Child Abuse Hotline [phone] (After Hours)  Emergency Shelter [facility] [phone]  Maternity Homes Room at the [place] [facility] [phone] [facility] [phone]  MRSA Hotline #:   (870)376-9387    [facility] Resources  [facility]     [employer]                          [facility]  Dept. [address] [place]                       [address]      [address]  Blondell Reveal Phone:  [phone]                                   Phone:  [phone]                 Phone:  [phone]  [facility] Phone:  [phone]  [facility] Child Abuse Hotline [phone] [phone] (After Hours)

## 2011-11-21 NOTE — ED Provider Notes (Signed)
History    46 year old female with frontal headache and facial pain. Symptom onset days ago. Gradual onset. Symptoms are constant. Feels congested the 67 drainage of her throat. Has been taking meds over the counter without much relief. No fevers or chills. No neck pain next at this. No acute visual complaints. Denies trauma. CSN: [id number]  Arrival date & time 11/14/11  1309   First MD Initiated Contact with Patient 11/14/11 1353      Chief Complaint  Patient presents with  . Facial Pain    (Consider location/radiation/quality/duration/timing/severity/associated sxs/prior treatment) HPI  Past Medical History  Diagnosis Date  . Diabetes mellitus   . Endometriosis   . Hypertension     Past Surgical History  Procedure Date  . Cholecystectomy   . Knee surgery     History reviewed. No pertinent family history.  History  Substance Use Topics  . Smoking status: Current Everyday Smoker  . Smokeless tobacco: Not on file  . Alcohol Use: Yes    OB History    Grav Para Term Preterm Abortions TAB SAB Ect Mult Living                  Review of Systems   Review of symptoms negative unless otherwise noted in HPI.   Allergies  Darvocet; Morphine and related; and Wellbutrin  Home Medications   Current Outpatient Rx  Name Route Sig Dispense Refill  . CELECOXIB 200 MG PO CAPS Oral Take 200 mg by mouth daily.    Marland Kitchen FEXOFENADINE-PSEUDOEPHED ER 60-120 MG PO TB12 Oral Take 1 tablet by mouth every 12 (twelve) hours. 30 tablet 0  . FLUOXETINE HCL 20 MG PO CAPS Oral Take 20 mg by mouth daily.      . FUROSEMIDE 40 MG PO TABS Oral Take 40 mg by mouth daily.      Marland Kitchen GLIPIZIDE 10 MG PO TABS Oral Take 10 mg by mouth 2 (two) times daily before a meal.      . HYDROCODONE-ACETAMINOPHEN 5-325 MG PO TABS Oral Take 1-2 tablets by mouth every 6 (six) hours as needed. pain     . MELOXICAM 15 MG PO TABS Oral Take 15 mg by mouth daily.      . OXYCODONE-ACETAMINOPHEN 5-325 MG PO TABS Oral Take 1  tablet by mouth every 4 (four) hours as needed for pain. 10 tablet 0  . POTASSIUM CHLORIDE CRYS ER 20 MEQ PO TBCR Oral Take 20 mEq by mouth daily.        BP 115/95  Pulse 100  Temp(Src) 98.7 F (37.1 C) (Oral)  Resp 20  Ht 5\' 1"  (1.549 m)  Wt 267 lb (121.11 kg)  BMI 50.45 kg/m2  SpO2 96%  LMP 10/14/2011  Physical Exam  Nursing note and vitals reviewed. Constitutional: She is oriented to person, place, and time. She appears well-developed and well-nourished. No distress.  HENT:  Head: Normocephalic and atraumatic.  Right Ear: External ear normal.  Left Ear: External ear normal.       Significant tenderness over the frontal maxillary sinuses. Boggy nasal mucosa. Posterior pharynx is grossly normal. Uvula is midline. Patient is handling her secretions. There is no midline spinal tenderness. Neck is supple and without adenopathy.  Eyes: Conjunctivae are normal. Pupils are equal, round, and reactive to light. Right eye exhibits no discharge. Left eye exhibits no discharge.  Neck: Normal range of motion. Neck supple.  Cardiovascular: Normal rate, regular rhythm and normal heart sounds.  Exam reveals no gallop and  no friction rub.   No murmur heard. Pulmonary/Chest: Effort normal and breath sounds normal. No respiratory distress.  Abdominal: Soft. She exhibits no distension. There is no tenderness.  Musculoskeletal: She exhibits no edema and no tenderness.  Lymphadenopathy:    She has no cervical adenopathy.  Neurological: She is alert and oriented to person, place, and time. No cranial nerve deficit. She exhibits normal muscle tone. Coordination normal.  Skin: Skin is warm and dry.  Psychiatric: She has a normal mood and affect. Her behavior is normal. Thought content normal.    ED Course  Procedures (including critical care time)  Labs Reviewed - No data to display No results found.   1. Sinusitis acute       MDM  46 year old female with facial pain and headache. Exam is  consistent with sinusitis. Will give course of antibiotics and pain medication as needed. Return precautions discussed. Outpatient followup as needed.        Raeford Razor, MD 11/21/11 [phone]

## 2011-12-29 ENCOUNTER — Encounter (HOSPITAL_BASED_OUTPATIENT_CLINIC_OR_DEPARTMENT_OTHER): Payer: Self-pay | Admitting: *Deleted

## 2011-12-29 ENCOUNTER — Emergency Department (HOSPITAL_BASED_OUTPATIENT_CLINIC_OR_DEPARTMENT_OTHER)
Admission: EM | Admit: 2011-12-29 | Discharge: 2011-12-30 | Disposition: A | Discharge status: Home / Self Care | Payer: Self-pay | Attending: Emergency Medicine | Admitting: Emergency Medicine

## 2011-12-29 DIAGNOSIS — M545 Low back pain, unspecified: Secondary | ICD-10-CM | POA: Insufficient documentation

## 2011-12-29 DIAGNOSIS — M79609 Pain in unspecified limb: Secondary | ICD-10-CM | POA: Insufficient documentation

## 2011-12-29 DIAGNOSIS — E119 Type 2 diabetes mellitus without complications: Secondary | ICD-10-CM | POA: Insufficient documentation

## 2011-12-29 DIAGNOSIS — M5431 Sciatica, right side: Secondary | ICD-10-CM

## 2011-12-29 DIAGNOSIS — M5430 Sciatica, unspecified side: Secondary | ICD-10-CM | POA: Insufficient documentation

## 2011-12-29 DIAGNOSIS — I10 Essential (primary) hypertension: Secondary | ICD-10-CM | POA: Insufficient documentation

## 2011-12-29 DIAGNOSIS — F17200 Nicotine dependence, unspecified, uncomplicated: Secondary | ICD-10-CM | POA: Insufficient documentation

## 2011-12-29 MED ORDER — HYDROMORPHONE HCL PF 1 MG/ML IJ SOLN
1.0000 mg | Freq: Once | INTRAMUSCULAR | Status: AC
  Administered 2011-12-29: 1 mg via INTRAVENOUS
  Filled 2011-12-29: qty 1

## 2011-12-29 MED ORDER — ONDANSETRON HCL 4 MG/2ML IJ SOLN
4.0000 mg | Freq: Once | INTRAMUSCULAR | Status: AC
  Administered 2011-12-29: 4 mg via INTRAVENOUS
  Filled 2011-12-29: qty 2

## 2011-12-29 NOTE — ED Notes (Signed)
Pt c/o lower back pain which radiates into right hip, also c/o h/a

## 2011-12-30 MED ORDER — HYDROCODONE-ACETAMINOPHEN 5-325 MG PO TABS: 1.0000 | ORAL_TABLET | Freq: Four times a day (QID) | ORAL | Status: AC | PRN

## 2011-12-30 MED ORDER — CYCLOBENZAPRINE HCL 10 MG PO TABS: 10.0000 mg | ORAL_TABLET | Freq: Two times a day (BID) | ORAL | Status: AC | PRN

## 2011-12-30 MED ORDER — HYDROMORPHONE HCL PF 1 MG/ML IJ SOLN
1.0000 mg | Freq: Once | INTRAMUSCULAR | Status: AC
  Administered 2011-12-30: 1 mg via INTRAVENOUS
  Filled 2011-12-30: qty 1

## 2011-12-30 NOTE — ED Provider Notes (Signed)
History     CSN: [id number]  Arrival date & time 12/29/11  2213   First MD Initiated Contact with Patient 12/29/11 2301      Chief Complaint  Patient presents with  . Back Pain    (Consider location/radiation/quality/duration/timing/severity/associated sxs/prior treatment) Patient is a 46 y.o. female presenting with back pain.  Back Pain  This is a new problem. The current episode started 2 days ago. The problem occurs constantly. The problem has not changed since onset.The pain is associated with no known injury. The pain is present in the lumbar spine. The quality of the pain is described as stabbing, shooting and aching. The pain radiates to the right thigh. The pain is at a severity of 10/10. The pain is moderate. The symptoms are aggravated by bending and twisting. Associated symptoms include leg pain. Pertinent negatives include no chest pain, no fever, no numbness, no abdominal pain, no bowel incontinence, no perianal numbness, no bladder incontinence, no dysuria, no pelvic pain, no paresthesias, no paresis, no tingling and no weakness. She has tried NSAIDs for the symptoms. The treatment provided mild relief.    Past Medical History  Diagnosis Date  . Diabetes mellitus   . Endometriosis   . Hypertension     Past Surgical History  Procedure Date  . Cholecystectomy   . Knee surgery     History reviewed. No pertinent family history.  History  Substance Use Topics  . Smoking status: Current Everyday Smoker  . Smokeless tobacco: Not on file  . Alcohol Use: Yes    OB History    Grav Para Term Preterm Abortions TAB SAB Ect Mult Living                  Review of Systems  Constitutional: Negative for fever.  HENT: Negative for congestion, neck pain and neck stiffness.   Eyes: Negative for visual disturbance.  Respiratory: Negative for shortness of breath.   Cardiovascular: Negative for chest pain.  Gastrointestinal: Negative for abdominal pain and bowel  incontinence.  Genitourinary: Negative for bladder incontinence, dysuria and pelvic pain.  Musculoskeletal: Positive for back pain.  Skin: Negative for rash.  Neurological: Negative for tingling, weakness, numbness and paresthesias.  Hematological: Does not bruise/bleed easily.    Allergies  Darvocet; Morphine and related; Percocet; and Wellbutrin  Home Medications   Current Outpatient Rx  Name Route Sig Dispense Refill  . CELECOXIB 200 MG PO CAPS Oral Take 200 mg by mouth daily.    Marland Kitchen ZYRTEC PO Oral Take 1 tablet by mouth daily.    Marland Kitchen FLUOXETINE HCL 20 MG PO CAPS Oral Take 20 mg by mouth daily.      . FUROSEMIDE 40 MG PO TABS Oral Take 40 mg by mouth daily.      Marland Kitchen GLIPIZIDE 10 MG PO TABS Oral Take 10 mg by mouth 2 (two) times daily before a meal.      . POTASSIUM CHLORIDE CRYS ER 20 MEQ PO TBCR Oral Take 20 mEq by mouth daily.      Marland Kitchen ZANTAC PO Oral Take 1 tablet by mouth 2 (two) times daily. Patient uses this medication for heartburn.    . CYCLOBENZAPRINE HCL 10 MG PO TABS Oral Take 1 tablet (10 mg total) by mouth 2 (two) times daily as needed for muscle spasms. 20 tablet 0  . HYDROCODONE-ACETAMINOPHEN 5-325 MG PO TABS Oral Take 1-2 tablets by mouth every 6 (six) hours as needed. pain     . HYDROCODONE-ACETAMINOPHEN 5-325  MG PO TABS Oral Take 1-2 tablets by mouth every 6 (six) hours as needed for pain. 14 tablet 0    BP 166/93  Pulse 87  Temp(Src) 99 F (37.2 C) (Oral)  Resp 16  Ht 5\' 1"  (1.549 m)  Wt 278 lb (126.1 kg)  BMI 52.53 kg/m2  SpO2 98%  LMP 11/26/2011  Physical Exam  Nursing note and vitals reviewed. Constitutional: She is oriented to person, place, and time. She appears well-developed and well-nourished.  HENT:  Head: Normocephalic and atraumatic.  Eyes: Conjunctivae and EOM are normal. Pupils are equal, round, and reactive to light.  Neck: Normal range of motion. Neck supple.  Cardiovascular: Normal rate, regular rhythm and normal heart sounds.   No murmur  heard. Pulmonary/Chest: Effort normal and breath sounds normal. No respiratory distress.  Abdominal: Soft. Bowel sounds are normal. There is no tenderness.  Musculoskeletal: Normal range of motion. She exhibits no edema and no tenderness.       No tenderness to palpation to lumbar spine or paraspinous muscle tenderness. Right lower extremity distally 2+ dorsalis pedis pulse motor strength and sensory is intact no focal deficit.  Neurological: She is alert and oriented to person, place, and time. No cranial nerve deficit. Coordination normal.  Skin: Skin is warm. No rash noted.    ED Course  Procedures (including critical care time)  Labs Reviewed - No data to display No results found.   1. Sciatica of right side       MDM  Patient without injury to the low back onset of low back pain which radiates into the right hip and right leg no focal neuro deficit to the right lower extremity. As stated above no injury suggestive of back fracture. Patient has had back problems in the past but this time it's worse. Symptoms started 2 days ago. Not improving with Motrin. In emergency department given IV Dilaudid 2 mg total and Zofran. With improvement he was patient able to move better right lower trimming without any neuro deficit all. We'll send patient home on the anti-inflammatory Flexeril and hydrocortisone. If not improving by Monday will return for followup.        Shelda Jakes, MD 12/30/11 [phone]

## 2011-12-30 NOTE — Discharge Instructions (Signed)
Take Flexeril as directed take hydrocodone as needed for more severe pain recommend he continue to take Motrin Maxivate 100 mg every 8 hours for the next 7 days. Work note given for the next several days. By Monday if not read return to work return here for evaluation return here for new worse symptoms.

## 2012-04-10 ENCOUNTER — Encounter (HOSPITAL_BASED_OUTPATIENT_CLINIC_OR_DEPARTMENT_OTHER): Payer: Self-pay | Admitting: Emergency Medicine

## 2012-04-10 ENCOUNTER — Emergency Department (HOSPITAL_BASED_OUTPATIENT_CLINIC_OR_DEPARTMENT_OTHER)
Admission: EM | Admit: 2012-04-10 | Discharge: 2012-04-11 | Disposition: A | Discharge status: Home / Self Care | Payer: Self-pay | Attending: Emergency Medicine | Admitting: Emergency Medicine

## 2012-04-10 DIAGNOSIS — Z79899 Other long term (current) drug therapy: Secondary | ICD-10-CM | POA: Insufficient documentation

## 2012-04-10 DIAGNOSIS — R52 Pain, unspecified: Secondary | ICD-10-CM | POA: Insufficient documentation

## 2012-04-10 DIAGNOSIS — E119 Type 2 diabetes mellitus without complications: Secondary | ICD-10-CM | POA: Insufficient documentation

## 2012-04-10 DIAGNOSIS — I10 Essential (primary) hypertension: Secondary | ICD-10-CM | POA: Insufficient documentation

## 2012-04-10 DIAGNOSIS — Z9089 Acquired absence of other organs: Secondary | ICD-10-CM | POA: Insufficient documentation

## 2012-04-10 DIAGNOSIS — F17200 Nicotine dependence, unspecified, uncomplicated: Secondary | ICD-10-CM | POA: Insufficient documentation

## 2012-04-10 DIAGNOSIS — R350 Frequency of micturition: Secondary | ICD-10-CM | POA: Insufficient documentation

## 2012-04-10 HISTORY — DX: Morbid (severe) obesity due to excess calories: E66.01

## 2012-04-10 LAB — COMPREHENSIVE METABOLIC PANEL
Albumin: 3.8 g/dL (ref 3.5–5.2)
BUN: 20 mg/dL (ref 6–23)
Calcium: 9.8 mg/dL (ref 8.4–10.5)
Creatinine, Ser: 0.7 mg/dL (ref 0.50–1.10)
Potassium: 3.4 mEq/L — ABNORMAL LOW (ref 3.5–5.1)
Total Protein: 6.9 g/dL (ref 6.0–8.3)

## 2012-04-10 LAB — CBC WITH DIFFERENTIAL/PLATELET
Basophils Relative: 1 % (ref 0–1)
Eosinophils Absolute: 0.2 10*3/uL (ref 0.0–0.7)
Eosinophils Relative: 2 % (ref 0–5)
Hemoglobin: 13.3 g/dL (ref 12.0–15.0)
MCH: 30.6 pg (ref 26.0–34.0)
MCHC: 35.1 g/dL (ref 30.0–36.0)
Monocytes Absolute: 0.7 10*3/uL (ref 0.1–1.0)
Monocytes Relative: 7 % (ref 3–12)
Neutrophils Relative %: 55 % (ref 43–77)

## 2012-04-10 LAB — URINALYSIS, ROUTINE W REFLEX MICROSCOPIC
Ketones, ur: NEGATIVE mg/dL
Leukocytes, UA: NEGATIVE
Nitrite: NEGATIVE
Specific Gravity, Urine: 1.025 (ref 1.005–1.030)
pH: 6 (ref 5.0–8.0)

## 2012-04-10 LAB — CK: Total CK: 81 U/L (ref 7–177)

## 2012-04-10 LAB — URINE MICROSCOPIC-ADD ON

## 2012-04-10 MED ORDER — FENTANYL CITRATE 0.05 MG/ML IJ SOLN
100.0000 ug | Freq: Once | INTRAMUSCULAR | Status: AC
  Administered 2012-04-10: 100 ug via INTRAVENOUS
  Filled 2012-04-10: qty 2

## 2012-04-10 MED ORDER — SODIUM CHLORIDE 0.9 % IV SOLN
INTRAVENOUS | Status: DC
  Administered 2012-04-10: via INTRAVENOUS

## 2012-04-10 NOTE — ED Notes (Signed)
MD at bedside. 

## 2012-04-10 NOTE — ED Notes (Signed)
Pt co generalized body aches and headache x 4 days. Pt reports urinary urgency.

## 2012-04-10 NOTE — ED Provider Notes (Signed)
History  This chart was scribed for Hanley Seamen, MD by Bennett Scrape. This patient was seen in room MH10/MH10 and the patient's care was started at 11:11PM.  CSN: [id number]  Arrival date & time 04/10/12  2224   First MD Initiated Contact with Patient 04/10/12 2311      Chief Complaint  Patient presents with  . Generalized Body Aches     The history is provided by the patient. No language interpreter was used.    [name] is a 46 y.o. female who presents to the Emergency Department complaining of 4 days of gradual onset, gradually worsening, constant entire body myalgias with associated urgency and frquency. She experiences elevated pain when moving joints. She denies taking OTC medications at home to improve symptoms. She denies having prior episodes of similar symptoms. She states that she recently started working at a new location with increased mosquito activity and believes that this could be the cause. She denies any recent foreign traveling or strenuous activity. She denies dysuria, hematuria, nausea, vomiting, chest pain, SOB and diarrhea as associated symptoms. She has a h/o DM, HTN, and endometriosis. She reports alcohol use and is a current everyday tobacco user.  Past Medical History  Diagnosis Date  . Diabetes mellitus   . Endometriosis   . Hypertension   . Morbid obesity     Past Surgical History  Procedure Date  . Cholecystectomy   . Knee surgery     No family history on file.  History  Substance Use Topics  . Smoking status: Current Everyday Smoker  . Smokeless tobacco: Not on file  . Alcohol Use: Yes    No OB history provided.  Review of Systems  Constitutional: Negative for fever and chills.  Respiratory: Negative for shortness of breath and wheezing.   Cardiovascular: Negative for chest pain.  Gastrointestinal: Negative for nausea, vomiting, abdominal pain and diarrhea.  Genitourinary: Positive for urgency and frequency. Negative for  dysuria and hematuria.  Musculoskeletal: Positive for myalgias. Negative for back pain.  Neurological: Negative for dizziness and headaches.    Allergies  Darvocet; Morphine and related; Percocet; and Wellbutrin  Home Medications   Current Outpatient Rx  Name Route Sig Dispense Refill  . CELECOXIB 200 MG PO CAPS Oral Take 200 mg by mouth daily.    Marland Kitchen ZYRTEC PO Oral Take 1 tablet by mouth daily.    Marland Kitchen FLUOXETINE HCL 20 MG PO CAPS Oral Take 20 mg by mouth daily.      . FUROSEMIDE 40 MG PO TABS Oral Take 40 mg by mouth daily.      Marland Kitchen GLIPIZIDE 10 MG PO TABS Oral Take 10 mg by mouth 2 (two) times daily before a meal.      . POTASSIUM CHLORIDE CRYS ER 20 MEQ PO TBCR Oral Take 20 mEq by mouth daily.      Marland Kitchen ZANTAC PO Oral Take 1 tablet by mouth 2 (two) times daily. Patient uses this medication for heartburn.      Triage vitals: BP 175/106  Pulse 93  Temp 97.8 F (36.6 C) (Oral)  Resp 18  Ht 5\' 3"  (1.6 m)  Wt 278 lb (126.1 kg)  BMI 49.25 kg/m2  SpO2 98%  Physical Exam  Nursing note and vitals reviewed. Constitutional: She is oriented to person, place, and time. She appears well-developed and well-nourished. No distress.  HENT:  Head: Normocephalic and atraumatic.  Eyes: Conjunctivae and EOM are normal. Pupils are equal, round, and reactive to light.  Neck: Normal range of motion. Neck supple. No tracheal deviation present.  Cardiovascular: Normal rate, regular rhythm and normal heart sounds.   Pulmonary/Chest: Effort normal and breath sounds normal. No respiratory distress. She has no wheezes. She has no rales. She exhibits no tenderness.  Abdominal: Soft. Bowel sounds are normal. There is no tenderness.  Musculoskeletal: Normal range of motion. She exhibits tenderness.       Tenderness to palpation of upper extremities, pain in passive movements of joints, no tenderness to palpation of lower extremities, pain in passive movements of joints  Neurological: She is alert and oriented to  person, place, and time.  Skin: Skin is warm and dry.  Psychiatric: She has a normal mood and affect. Her behavior is normal.    ED Course  Procedures (including critical care time)  DIAGNOSTIC STUDIES: Oxygen Saturation is 98% on room air, normal by my interpretation.    COORDINATION OF CARE: 11:18PM- Informed pt that blood work was necessary to determine cause of symptoms.     MDM   Nursing notes and vitals signs, including pulse oximetry, reviewed.  Summary of this visit's results, reviewed by myself:  Labs:  Results for orders placed during the hospital encounter of 04/10/12  URINALYSIS, ROUTINE W REFLEX MICROSCOPIC      Component Value Range   Color, Urine YELLOW  YELLOW   APPearance CLEAR  CLEAR   Specific Gravity, Urine 1.025  1.005 - 1.030   pH 6.0  5.0 - 8.0   Glucose, UA NEGATIVE  NEGATIVE mg/dL   Hgb urine dipstick TRACE (*) NEGATIVE   Bilirubin Urine NEGATIVE  NEGATIVE   Ketones, ur NEGATIVE  NEGATIVE mg/dL   Protein, ur NEGATIVE  NEGATIVE mg/dL   Urobilinogen, UA 0.2  0.0 - 1.0 mg/dL   Nitrite NEGATIVE  NEGATIVE   Leukocytes, UA NEGATIVE  NEGATIVE  PREGNANCY, URINE      Component Value Range   Preg Test, Ur NEGATIVE  NEGATIVE  URINE MICROSCOPIC-ADD ON      Component Value Range   Squamous Epithelial / LPF RARE  RARE   RBC / HPF 0-2  <3 RBC/hpf   Bacteria, UA RARE  RARE  CBC WITH DIFFERENTIAL      Component Value Range   WBC 10.5  4.0 - 10.5 K/uL   RBC 4.34  3.87 - 5.11 MIL/uL   Hemoglobin 13.3  12.0 - 15.0 g/dL   HCT 16.1  09.6 - 04.5 %   MCV 87.3  78.0 - 100.0 fL   MCH 30.6  26.0 - 34.0 pg   MCHC 35.1  30.0 - 36.0 g/dL   RDW 40.9  81.1 - 91.4 %   Platelets 261  150 - 400 K/uL   Neutrophils Relative 55  43 - 77 %   Neutro Abs 5.8  1.7 - 7.7 K/uL   Lymphocytes Relative 36  12 - 46 %   Lymphs Abs 3.8  0.7 - 4.0 K/uL   Monocytes Relative 7  3 - 12 %   Monocytes Absolute 0.7  0.1 - 1.0 K/uL   Eosinophils Relative 2  0 - 5 %   Eosinophils  Absolute 0.2  0.0 - 0.7 K/uL   Basophils Relative 1  0 - 1 %   Basophils Absolute 0.1  0.0 - 0.1 K/uL  COMPREHENSIVE METABOLIC PANEL      Component Value Range   Sodium 137  135 - 145 mEq/L   Potassium 3.4 (*) 3.5 - 5.1 mEq/L  Chloride 100  96 - 112 mEq/L   CO2 25  19 - 32 mEq/L   Glucose, Bld 145 (*) 70 - 99 mg/dL   BUN 20  6 - 23 mg/dL   Creatinine, Ser 6.44  0.50 - 1.10 mg/dL   Calcium 9.8  8.4 - 03.4 mg/dL   Total Protein 6.9  6.0 - 8.3 g/dL   Albumin 3.8  3.5 - 5.2 g/dL   AST 14  0 - 37 U/L   ALT 21  0 - 35 U/L   Alkaline Phosphatase 94  39 - 117 U/L   Total Bilirubin 0.2 (*) 0.3 - 1.2 mg/dL   GFR calc non Af Amer >90  >90 mL/min   GFR calc Af Amer >90  >90 mL/min  SEDIMENTATION RATE      Component Value Range   Sed Rate 5  0 - 22 mm/hr  CK      Component Value Range   Total CK 81  7 - 177 U/L    1:15 AM Patient advised of lab findings are non-specific nature of diagnosis. She was advised of the need for followup.      I personally performed the services described in this documentation, which was scribed in my presence.  The recorded information has been reviewed and considered.    Hanley Seamen, MD 04/11/12 [phone]

## 2012-04-11 LAB — SEDIMENTATION RATE: Sed Rate: 5 mm/hr (ref 0–22)

## 2012-04-11 MED ORDER — NAPROXEN SODIUM 220 MG PO TABS: ORAL_TABLET | ORAL | Status: DC

## 2012-04-11 MED ORDER — FENTANYL CITRATE 0.05 MG/ML IJ SOLN
100.0000 ug | Freq: Once | INTRAMUSCULAR | Status: AC
  Administered 2012-04-11: 100 ug via INTRAVENOUS
  Filled 2012-04-11: qty 2

## 2012-04-11 MED ORDER — HYDROCODONE-ACETAMINOPHEN 5-325 MG PO TABS: 1.0000 | ORAL_TABLET | Freq: Four times a day (QID) | ORAL | Status: AC | PRN

## 2012-04-11 NOTE — ED Notes (Signed)
MD at bedside. 

## 2012-07-25 ENCOUNTER — Emergency Department (HOSPITAL_BASED_OUTPATIENT_CLINIC_OR_DEPARTMENT_OTHER)
Admission: EM | Admit: 2012-07-25 | Discharge: 2012-07-25 | Disposition: A | Discharge status: Home / Self Care | Payer: Self-pay | Attending: Emergency Medicine | Admitting: Emergency Medicine

## 2012-07-25 ENCOUNTER — Emergency Department (HOSPITAL_BASED_OUTPATIENT_CLINIC_OR_DEPARTMENT_OTHER): Payer: Self-pay

## 2012-07-25 ENCOUNTER — Encounter (HOSPITAL_BASED_OUTPATIENT_CLINIC_OR_DEPARTMENT_OTHER): Payer: Self-pay | Admitting: *Deleted

## 2012-07-25 DIAGNOSIS — E119 Type 2 diabetes mellitus without complications: Secondary | ICD-10-CM | POA: Insufficient documentation

## 2012-07-25 DIAGNOSIS — M25579 Pain in unspecified ankle and joints of unspecified foot: Secondary | ICD-10-CM | POA: Insufficient documentation

## 2012-07-25 DIAGNOSIS — N809 Endometriosis, unspecified: Secondary | ICD-10-CM | POA: Insufficient documentation

## 2012-07-25 DIAGNOSIS — F17200 Nicotine dependence, unspecified, uncomplicated: Secondary | ICD-10-CM | POA: Insufficient documentation

## 2012-07-25 DIAGNOSIS — Z79899 Other long term (current) drug therapy: Secondary | ICD-10-CM | POA: Insufficient documentation

## 2012-07-25 DIAGNOSIS — M25571 Pain in right ankle and joints of right foot: Secondary | ICD-10-CM

## 2012-07-25 DIAGNOSIS — I10 Essential (primary) hypertension: Secondary | ICD-10-CM | POA: Insufficient documentation

## 2012-07-25 MED ORDER — HYDROCODONE-ACETAMINOPHEN 5-325 MG PO TABS
1.0000 | ORAL_TABLET | Freq: Once | ORAL | Status: AC
  Administered 2012-07-25: 1 via ORAL
  Filled 2012-07-25 (×2): qty 1

## 2012-07-25 MED ORDER — HYDROCODONE-ACETAMINOPHEN 5-325 MG PO TABS: 1.0000 | ORAL_TABLET | ORAL | Status: DC | PRN

## 2012-07-25 NOTE — ED Notes (Signed)
Pt c/o of right foot and ankle pain that began 2 days ago. She sts she can bear weight but it is very painful. She sts she has had this pain before and was treated with a cortisone shot.

## 2012-07-25 NOTE — ED Provider Notes (Signed)
History     CSN: [id number]  Arrival date & time 07/25/12  1914   First MD Initiated Contact with Patient 07/25/12 2012      Chief Complaint  Patient presents with  . Foot Pain    (Consider location/radiation/quality/duration/timing/severity/associated sxs/prior treatment) HPI  Patient complaining of right heel pain that began on Sunday. This began while she was walking on it but she denies any acute injury. She states that she did fall several months ago. She has had pain some level since that time. This pain began acutely however on Sunday without known direct trauma. She has not noted any swelling, discoloration, or crepitus. She is diabetic but has not noted any foot ulcers.  Past Medical History  Diagnosis Date  . Diabetes mellitus   . Endometriosis   . Hypertension   . Morbid obesity     Past Surgical History  Procedure Date  . Cholecystectomy   . Knee surgery     No family history on file.  History  Substance Use Topics  . Smoking status: Current Every Day Smoker  . Smokeless tobacco: Not on file  . Alcohol Use: Yes    OB History    Grav Para Term Preterm Abortions TAB SAB Ect Mult Living                  Review of Systems  Constitutional: Negative for fever, chills, activity change, appetite change and unexpected weight change.  HENT: Negative for sore throat, rhinorrhea, neck pain, neck stiffness and sinus pressure.   Eyes: Negative for visual disturbance.  Respiratory: Negative for cough and shortness of breath.   Cardiovascular: Negative for chest pain and leg swelling.  Gastrointestinal: Negative for vomiting, abdominal pain, diarrhea and blood in stool.  Genitourinary: Negative for dysuria, urgency, frequency, vaginal discharge and difficulty urinating.  Musculoskeletal: Positive for arthralgias and gait problem. Negative for myalgias.  Skin: Negative for color change and rash.  Neurological: Negative for weakness, light-headedness and  headaches.  Hematological: Does not bruise/bleed easily.  Psychiatric/Behavioral: Negative for dysphoric mood.    Allergies  Darvocet; Morphine and related; Percocet; and Wellbutrin  Home Medications   Current Outpatient Rx  Name Route Sig Dispense Refill  . CELECOXIB 200 MG PO CAPS Oral Take 200 mg by mouth daily.    Marland Kitchen ZYRTEC PO Oral Take 1 tablet by mouth daily.    Marland Kitchen FLUOXETINE HCL 20 MG PO CAPS Oral Take 20 mg by mouth daily.      . FUROSEMIDE 40 MG PO TABS Oral Take 40 mg by mouth daily.      Marland Kitchen GLIPIZIDE 10 MG PO TABS Oral Take 10 mg by mouth 2 (two) times daily before a meal.      . NAPROXEN SODIUM 220 MG PO TABS  Take 2 tablets twice daily as needed for pain. Best taken on a full stomach.    Marland Kitchen POTASSIUM CHLORIDE CRYS ER 20 MEQ PO TBCR Oral Take 20 mEq by mouth daily.      Marland Kitchen ZANTAC PO Oral Take 1 tablet by mouth 2 (two) times daily. Patient uses this medication for heartburn.      BP 151/82  Pulse 88  Temp 98.5 F (36.9 C) (Oral)  Resp 18  SpO2 100%  LMP 07/18/2012  Physical Exam  Vitals reviewed. Constitutional: She is oriented to person, place, and time. She appears well-developed.       Morbidly obese female  HENT:  Head: Normocephalic and atraumatic.  Eyes: Pupils  are equal, round, and reactive to light.  Cardiovascular: Normal rate.   Pulmonary/Chest: Effort normal.  Musculoskeletal:       Diffuse tenderness posterior lateral aspect of right ankle. No swelling is noted. No wounds or ulcers are noted. Dorsal pedalis pulse is intact and equal to left side. Toes are pink capillary refill is less than 2 seconds.  Neurological: She is alert and oriented to person, place, and time.  Skin: Skin is warm and dry.  Psychiatric: She has a normal mood and affect.    ED Course  Procedures (including critical care time)  Labs Reviewed - No data to display Dg Ankle Complete Right  07/25/2012  *RADIOLOGY REPORT*  Clinical Data: Right ankle pain.  RIGHT ANKLE - COMPLETE  3+ VIEW  Comparison: Right ankle films 03/06/2008.  Findings: The ankle is located.  No acute bone or soft tissue abnormalities present.  There is no significant joint effusion.  IMPRESSION: Negative right ankle.   Original Report Authenticated By: Jamesetta Orleans. MATTERN, M.D.    Dg Foot Complete Right  07/25/2012  *RADIOLOGY REPORT*  Clinical Data: Right foot pain.  RIGHT FOOT COMPLETE - 3+ VIEW  Comparison: None.  Findings: No acute bone or soft tissue abnormalities are present. The MCP joints are extended.  The hind foot is unremarkable.  IMPRESSION: No acute abnormality.   Original Report Authenticated By: Jamesetta Orleans. MATTERN, M.D.      No diagnosis found.    MDM  No acute abnormality is seen on x-Renley Banwart. Patient is placed in CAM walker and referred for orthopedic followup.        Hilario Quarry, MD 07/25/12 2131

## 2012-07-30 ENCOUNTER — Ambulatory Visit (INDEPENDENT_AMBULATORY_CARE_PROVIDER_SITE_OTHER): Payer: Self-pay | Admitting: Family Medicine

## 2012-07-30 VITALS — BP 162/122 | HR 122 | Temp 97.9°F | Resp 18 | Ht 62.75 in | Wt 272.0 lb

## 2012-07-30 DIAGNOSIS — G4700 Insomnia, unspecified: Secondary | ICD-10-CM

## 2012-07-30 DIAGNOSIS — I10 Essential (primary) hypertension: Secondary | ICD-10-CM

## 2012-07-30 DIAGNOSIS — M6610 Rupture of synovium, unspecified joint: Secondary | ICD-10-CM

## 2012-07-30 DIAGNOSIS — F419 Anxiety disorder, unspecified: Secondary | ICD-10-CM

## 2012-07-30 DIAGNOSIS — M79673 Pain in unspecified foot: Secondary | ICD-10-CM

## 2012-07-30 DIAGNOSIS — E119 Type 2 diabetes mellitus without complications: Secondary | ICD-10-CM

## 2012-07-30 DIAGNOSIS — F411 Generalized anxiety disorder: Secondary | ICD-10-CM

## 2012-07-30 LAB — COMPREHENSIVE METABOLIC PANEL
Albumin: 4 g/dL (ref 3.5–5.2)
Alkaline Phosphatase: 91 U/L (ref 39–117)
BUN: 15 mg/dL (ref 6–23)
CO2: 24 mEq/L (ref 19–32)
Calcium: 9.6 mg/dL (ref 8.4–10.5)
Glucose, Bld: 142 mg/dL — ABNORMAL HIGH (ref 70–99)
Potassium: 4 mEq/L (ref 3.5–5.3)
Sodium: 136 mEq/L (ref 135–145)
Total Protein: 6.6 g/dL (ref 6.0–8.3)

## 2012-07-30 LAB — POCT URINALYSIS DIPSTICK
Bilirubin, UA: NEGATIVE
Glucose, UA: NEGATIVE
Nitrite, UA: NEGATIVE
Spec Grav, UA: 1.02

## 2012-07-30 LAB — POCT UA - MICROSCOPIC ONLY: Crystals, Ur, HPF, POC: NEGATIVE

## 2012-07-30 LAB — POCT CBC
HCT, POC: 46.8 % (ref 37.7–47.9)
Hemoglobin: 14.5 g/dL (ref 12.2–16.2)
Lymph, poc: 2.8 (ref 0.6–3.4)
MCHC: 31 g/dL — AB (ref 31.8–35.4)
MCV: 93.1 fL (ref 80–97)
POC LYMPH PERCENT: 34.9 %L (ref 10–50)
RDW, POC: 14.8 %
WBC: 8 10*3/uL (ref 4.6–10.2)

## 2012-07-30 MED ORDER — HYDROCHLOROTHIAZIDE 12.5 MG PO TABS: ORAL_TABLET | ORAL | Status: DC

## 2012-07-30 MED ORDER — GLIPIZIDE 10 MG PO TABS: 10.0000 mg | ORAL_TABLET | Freq: Two times a day (BID) | ORAL | Status: DC

## 2012-07-30 MED ORDER — POTASSIUM CHLORIDE CRYS ER 20 MEQ PO TBCR: 20.0000 meq | EXTENDED_RELEASE_TABLET | Freq: Every day | ORAL | Status: DC

## 2012-07-30 MED ORDER — FLUOXETINE HCL 20 MG PO CAPS: 20.0000 mg | ORAL_CAPSULE | Freq: Every day | ORAL | Status: DC

## 2012-07-30 MED ORDER — HYDROCODONE-ACETAMINOPHEN 5-325 MG PO TABS: 1.0000 | ORAL_TABLET | Freq: Four times a day (QID) | ORAL | Status: DC | PRN

## 2012-07-30 MED ORDER — METOPROLOL TARTRATE 25 MG PO TABS: 25.0000 mg | ORAL_TABLET | Freq: Two times a day (BID) | ORAL | Status: DC

## 2012-07-30 MED ORDER — MELOXICAM 15 MG PO TABS: 15.0000 mg | ORAL_TABLET | Freq: Every day | ORAL | Status: DC

## 2012-07-30 NOTE — Progress Notes (Signed)
[address]   [place], [place]  [address]   [phone]  Subjective:    Patient ID: [name], female    DOB: 09-29-1965, 46 y.o.   MRN: [id number]  HPIThis 46 y.o. female presents for evaluation of the following:  1.  R foot pain:  History of plantar fasciitis; s/p injections in past with improvement.  Evaluated by [facility]; s/p xrays of foot and ankle negative; placed in CAM walker .  Appointment with Dr. Selena Batten on Monday but unable to get off tomorrow.  Onset of foot pain one week ago.  No injury.  Able to ambulate with CAM walker.  Pain hurts in R heel and radiates into posterior ankle/Achilles region.  Slept in CAM walker last night with mild improvement.  Before CAM walker, unable to walk hardly.  Taking Celebrex but so expensive.  Has taken Mobic 15mg  in past.  Icing ankle/foot.  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    2.  DMII:  Onset since 2008; previously [facility].  Works in [place]; would like to establish her.  Initially started on Metformin which caused chest pain, arm pain.  Tolerates Glipizide well.  Sugars running 150-300.  Last labs one year ago.  No flu vaccine.  Pneumovax in 2008.  Last eye exam 2012; +glasses.    3.  HTN:  Years; previously took BP medication; stopped medication because reading was always good. Previously took Micardis but expensive.  Denies CP/palp/SOB/leg swelling.  4.  Anxiety: previously took Prozac; no Prozac in six months.  Sister with paranoid schizophrenia and not doing well.  Major stressors.  Tearful.  Not sleeping well due to stressors; would like something for sleep.  5.  Arthritis: B knees.  Intermittent B Knee pain with swelling.   Review of Systems  Constitutional: Negative for fever, chills, diaphoresis and fatigue.  Respiratory: Negative for shortness of breath, wheezing and stridor.   Cardiovascular: Negative for chest pain, palpitations and leg swelling.  Musculoskeletal:  Positive for joint swelling, arthralgias and gait problem.  Neurological: Negative for dizziness, tremors, seizures, syncope, facial asymmetry, speech difficulty, weakness, light-headedness, numbness and headaches.  Psychiatric/Behavioral: Positive for sleep disturbance. Negative for suicidal ideas, self-injury and dysphoric mood. The patient is nervous/anxious.         Past Medical History  Diagnosis Date  . Diabetes mellitus   . Endometriosis   . Hypertension   . Morbid obesity   . Arthritis   . Anxiety   . Obstructive sleep apnea     CPAP NIGHTLY; sleep study 2007.    Past Surgical History  Procedure Date  . Cholecystectomy   . Knee surgery   . Knee arthrocentesis   . Tubal ligation     Prior to Admission medications   Medication Sig Start Date End Date Taking? Authorizing Provider  celecoxib (CELEBREX) 200 MG capsule Take 200 mg by mouth daily.   Yes Historical Provider, MD  Cetirizine HCl (ZYRTEC PO) Take 1 tablet by mouth daily.   Yes Historical Provider, MD  FLUoxetine (PROZAC) 20 MG capsule Take 1 capsule (20 mg total) by mouth daily. 07/30/12  Yes Ethelda Chick, MD  furosemide (LASIX) 40 MG tablet Take 40 mg by mouth daily.     Yes Historical Provider, MD  glipiZIDE (GLUCOTROL) 10 MG tablet Take 1 tablet (10 mg total) by mouth 2 (two) times daily before a meal. 07/30/12  Yes Ethelda Chick, MD  meloxicam [facility]) 15  MG tablet Take 1 tablet (15 mg total) by mouth daily. 07/30/12  Yes Ethelda Chick, MD  potassium chloride SA (K-DUR,KLOR-CON) 20 MEQ tablet Take 1 tablet (20 mEq total) by mouth daily. 07/30/12  Yes Ethelda Chick, MD  Ranitidine HCl (ZANTAC PO) Take 1 tablet by mouth 2 (two) times daily. Patient uses this medication for heartburn.   Yes Historical Provider, MD  azithromycin (ZITHROMAX) 250 MG tablet Take 1 tablet (250 mg total) by mouth daily. Take first 2 tablets together, then 1 every day until finished. 10/30/12   Loren Racer, MD  benzonatate (TESSALON)  100 MG capsule Take 1 capsule (100 mg total) by mouth every 8 (eight) hours. 10/30/12   Loren Racer, MD  fluticasone (FLONASE) 50 MCG/ACT nasal spray Place 2 sprays into the nose daily. 10/30/12   Loren Racer, MD  hydrochlorothiazide (HYDRODIURIL) 25 MG tablet Take 1 tablet (25 mg total) by mouth daily. 10/08/12   Chelle Tessa Lerner, PA-C  HYDROcodone-acetaminophen (NORCO/VICODIN) 5-325 MG per tablet Take 1 tablet by mouth every 6 (six) hours as needed for pain. 10/06/12   Ethelda Chick, MD  ibuprofen (ADVIL,MOTRIN) 600 MG tablet Take 1 tablet (600 mg total) by mouth every 6 (six) hours as needed for pain. 10/30/12   Loren Racer, MD  metoprolol tartrate (LOPRESSOR) 25 MG tablet Take 1 tablet (25 mg total) by mouth 2 (two) times daily. 10/08/12   Chelle S Jeffery, PA-C  naproxen sodium (ALEVE) 220 MG tablet Take 2 tablets twice daily as needed for pain. Best taken on a full stomach. 04/11/12   Carlisle Beers Molpus, MD  oxymetazoline (AFRIN NASAL SPRAY) 0.05 % nasal spray Place 2 sprays into the nose 2 (two) times daily. 10/30/12   Loren Racer, MD    Allergies  Allergen Reactions  . Darvocet (Propoxyphene-Acetaminophen) Hives and Itching  . Lisinopril Cough  . Morphine And Related Nausea And Vomiting  . Percocet (Oxycodone-Acetaminophen) Itching    And headache  . Wellbutrin (Bupropion Hcl)     Hears voices    History   Social History  . Marital Status: Married    Spouse Name: N/A    Number of Children: N/A  . Years of Education: N/A   Occupational History  . Not on file.   Social History Main Topics  . Smoking status: Current Every Day Smoker -- 0.5 packs/day for 15 years    Types: Cigarettes  . Smokeless tobacco: Never Used  . Alcohol Use: Yes  . Drug Use: No  . Sexually Active: Yes   Other Topics Concern  . Not on file   Social History Narrative   Marital status: married x 25 years.   Children:  One son (30), no grandchildren, 6 dogs   Lives: with husband, son.   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     Tobacco: 1/2 ppd x 15 years.   Alcohol: socially; special ocassions.   Drugs: none   Exercise: none    Family History  Problem Relation Age of Onset  . Diabetes Mother   . Thyroid disease Mother   . Depression Mother     with anxiety  . Stroke Mother   . Diabetes Father   . Hypertension Father   . Diabetes Sister   . Hypertension Sister   . Hypertension Brother     Objective:   Physical Exam  Nursing note and vitals reviewed. Constitutional: She is oriented to person, place, and time. She appears well-developed and well-nourished. No distress.  HENT:  Mouth/Throat: Oropharynx is clear and moist.  Eyes: Conjunctivae normal and EOM are normal. Pupils are equal, round, and reactive to light.  Neck: Normal range of motion. Neck supple. No JVD present. No thyromegaly present.  Cardiovascular: Normal rate, regular rhythm, normal heart sounds and intact distal pulses.  Exam reveals no gallop and no friction rub.   No murmur heard. Pulmonary/Chest: Effort normal and breath sounds normal. She has no wheezes. She has no rales.  Abdominal: Soft. Bowel sounds are normal. There is no tenderness. There is no rebound and no guarding.  Musculoskeletal:       Right ankle: Normal.       Right foot: She exhibits tenderness and bony tenderness. She exhibits normal range of motion, no swelling, normal capillary refill, no crepitus, no deformity and no laceration.       R foot:  No swelling; +TTP heel of R foot; non-tender along metatarsals.  Lymphadenopathy:    She has no cervical adenopathy.  Neurological: She is alert and oriented to person, place, and time. No cranial nerve deficit. She exhibits normal muscle tone. Coordination normal.  Skin: Skin is warm and dry. No rash noted. She is not diaphoretic. No erythema. No pallor.  Psychiatric: She has a normal mood and affect. Her behavior is normal. Judgment and thought content normal.          Results for  orders placed in visit on 07/30/12  POCT CBC      Component Value Range   WBC 8.0  4.6 - 10.2 K/uL   Lymph, poc 2.8  0.6 - 3.4   POC LYMPH PERCENT 34.9  10 - 50 %L   MID (cbc) 0.6  0 - 0.9   POC MID % 7.8  0 - 12 %M   POC Granulocyte 4.6  2 - 6.9   Granulocyte percent 57.3  37 - 80 %G   RBC 5.03  4.04 - 5.48 M/uL   Hemoglobin 14.5  12.2 - 16.2 g/dL   HCT, POC 78.2  95.6 - 47.9 %   MCV 93.1  80 - 97 fL   MCH, POC 28.8  27 - 31.2 pg   MCHC 31.0 (*) 31.8 - 35.4 g/dL   RDW, POC 21.3     Platelet Count, POC 413  142 - 424 K/uL   MPV 8.0  0 - 99.8 fL  GLUCOSE, POCT (MANUAL RESULT ENTRY)      Component Value Range   POC Glucose 130 (*) 70 - 99 mg/dl  POCT GLYCOSYLATED HEMOGLOBIN (HGB A1C)      Component Value Range   Hemoglobin A1C 8.9    POCT URINALYSIS DIPSTICK      Component Value Range   Color, UA yellow     Clarity, UA clear     Glucose, UA neg     Bilirubin, UA neg     Ketones, UA trace     Spec Grav, UA 1.020     Blood, UA trace     pH, UA 6.0     Protein, UA neg     Urobilinogen, UA 0.2     Nitrite, UA neg     Leukocytes, UA Negative     EKG: SINUS TACHYCARDIA AT 102. Assessment & Plan:   1. Type II or unspecified type diabetes mellitus without mention of complication, not stated as uncontrolled  POCT glucose (manual entry), POCT glycosylated hemoglobin (Hb A1C), POCT urinalysis dipstick, POCT UA - Microscopic Only  2. Essential  hypertension, benign  POCT CBC, Comprehensive metabolic panel, EKG 12-Lead, potassium chloride SA (K-DUR,KLOR-CON) 20 MEQ tablet  3. Anxiety    4. Insomnia    5. Pain, heel      1.  DMII: uncontrolled; obtain labs; rx provided.  Follow up in three months.   2.  HTN: uncontrolled; rx provided; obtain labs.  To call in one month with BP readings.   3.  Anxiety: uncontrolled; rx provided.  4.  Insomnia: uncontrolled; secondary to uncontrolled anxiety. 5.  Heel Pain R: new.  Refused to administer heel injection; recommend conservative  treatment with Mobic, Vicodin, CAM walker. If no improvement in 2-4 weeks, follow-up with podiatry. Meds ordered this encounter  Medications  . DISCONTD: meloxicam (MOBIC) 15 MG tablet    Sig: Take 15 mg by mouth daily.  Marland Kitchen DISCONTD: metoprolol tartrate (LOPRESSOR) 25 MG tablet    Sig: Take 1 tablet (25 mg total) by mouth 2 (two) times daily.    Dispense:  60 tablet    Refill:  3  . DISCONTD: hydrochlorothiazide (HYDRODIURIL) 12.5 MG tablet    Sig: 1/2 tablet daily    Dispense:  30 tablet    Refill:  5  . meloxicam (MOBIC) 15 MG tablet    Sig: Take 1 tablet (15 mg total) by mouth daily.    Dispense:  30 tablet    Refill:  5  . FLUoxetine (PROZAC) 20 MG capsule    Sig: Take 1 capsule (20 mg total) by mouth daily.    Dispense:  30 capsule    Refill:  5  . glipiZIDE (GLUCOTROL) 10 MG tablet    Sig: Take 1 tablet (10 mg total) by mouth 2 (two) times daily before a meal.    Dispense:  60 tablet    Refill:  5  . DISCONTD: HYDROcodone-acetaminophen (NORCO/VICODIN) 5-325 MG per tablet    Sig: Take 1 tablet by mouth every 6 (six) hours as needed for pain.    Dispense:  30 tablet    Refill:  0  . potassium chloride SA (K-DUR,KLOR-CON) 20 MEQ tablet    Sig: Take 1 tablet (20 mEq total) by mouth daily.    Dispense:  30 tablet    Refill:  5

## 2012-07-30 NOTE — Patient Instructions (Addendum)
1. Type II or unspecified type diabetes mellitus without mention of complication, not stated as uncontrolled  POCT glucose (manual entry), POCT glycosylated hemoglobin (Hb A1C), POCT urinalysis dipstick, POCT UA - Microscopic Only  2. Essential hypertension, benign  POCT CBC, Comprehensive metabolic panel, EKG 12-Lead  3. Anxiety    4. Insomnia    5. Pain, heel

## 2012-08-11 ENCOUNTER — Telehealth: Payer: Self-pay

## 2012-08-11 MED ORDER — HYDROCHLOROTHIAZIDE 25 MG PO TABS: 25.0000 mg | ORAL_TABLET | Freq: Every day | ORAL | Status: DC

## 2012-08-11 NOTE — Telephone Encounter (Signed)
Pt agreed to new dosages of medications. She stated she has enough metoprolol to take 1 1/2 BID for a while and will CB if it is working and needs a new Rx for new dose. Pt DOES need a new Rx for the HCTZ 25 mg. Sending in a Rx to [facility] w/readback to Maralyn Sago.

## 2012-08-11 NOTE — Telephone Encounter (Signed)
PT SAW DR. Katrinka Blazing AND WAS PLACED ON NEW BP MEDICINE.  SAYS HER BP IS STILL HIGH.  SAYS SHE LOADED AN APP ON HER PHONE SO THAT SHE COULD MONITOR IT. WANTS DR. Katrinka Blazing TO PLEASE CALL [phone]

## 2012-08-11 NOTE — Telephone Encounter (Signed)
Dr. Katrinka Blazing, Please advise. Reply back to the clinical message pool. Thanks!

## 2012-08-11 NOTE — Telephone Encounter (Signed)
Called patient to check what her readings have been this am it was 169/97 , has been this or higher. Sunday was 187/97. Pulse rate between 70 and 80  Taking metoprolol 25 bid and HCTZ 12.5, states okay to leave mssg if we call back.

## 2012-08-11 NOTE — Telephone Encounter (Signed)
Please call back ---- have her increase her blood pressure medications to the following:  1.  Metoprolol 25mg    1 1/2 tablets twice daily.  2.  HCTZ 25mg  total daily (does she have 12.5mg  tablets or 25mg  tablets?)  KMS

## 2012-08-27 ENCOUNTER — Other Ambulatory Visit: Payer: Self-pay | Admitting: Physician Assistant

## 2012-10-06 ENCOUNTER — Telehealth: Payer: Self-pay

## 2012-10-06 MED ORDER — HYDROCODONE-ACETAMINOPHEN 5-325 MG PO TABS: 1.0000 | ORAL_TABLET | Freq: Four times a day (QID) | ORAL | Status: DC | PRN

## 2012-10-06 NOTE — Telephone Encounter (Signed)
Called pt and she is requesting Hydrocodone please advise.

## 2012-10-06 NOTE — Telephone Encounter (Signed)
OK to refill hydrocodone one time only.  Confirm that patient also taking Meloxicam/Mobic for heel pain.  KMS

## 2012-10-06 NOTE — Telephone Encounter (Signed)
Pt is experiencing the same problem with her foot as before and would like to know if more pain meds can be called in   Best number [phone]

## 2012-10-08 MED ORDER — METOPROLOL TARTRATE 25 MG PO TABS: 25.0000 mg | ORAL_TABLET | Freq: Two times a day (BID) | ORAL | Status: DC

## 2012-10-08 MED ORDER — HYDROCHLOROTHIAZIDE 25 MG PO TABS: 25.0000 mg | ORAL_TABLET | Freq: Every day | ORAL | Status: DC

## 2012-10-08 NOTE — Telephone Encounter (Signed)
Pt notified and she is still taking meloxicam for heel pain

## 2012-10-30 ENCOUNTER — Encounter (HOSPITAL_BASED_OUTPATIENT_CLINIC_OR_DEPARTMENT_OTHER): Payer: Self-pay | Admitting: *Deleted

## 2012-10-30 ENCOUNTER — Emergency Department (HOSPITAL_BASED_OUTPATIENT_CLINIC_OR_DEPARTMENT_OTHER)
Admission: EM | Admit: 2012-10-30 | Discharge: 2012-10-30 | Disposition: A | Discharge status: Home / Self Care | Payer: Self-pay | Attending: Emergency Medicine | Admitting: Emergency Medicine

## 2012-10-30 ENCOUNTER — Emergency Department (HOSPITAL_BASED_OUTPATIENT_CLINIC_OR_DEPARTMENT_OTHER): Payer: Self-pay

## 2012-10-30 DIAGNOSIS — E119 Type 2 diabetes mellitus without complications: Secondary | ICD-10-CM | POA: Insufficient documentation

## 2012-10-30 DIAGNOSIS — Z791 Long term (current) use of non-steroidal anti-inflammatories (NSAID): Secondary | ICD-10-CM | POA: Insufficient documentation

## 2012-10-30 DIAGNOSIS — F411 Generalized anxiety disorder: Secondary | ICD-10-CM | POA: Insufficient documentation

## 2012-10-30 DIAGNOSIS — J329 Chronic sinusitis, unspecified: Secondary | ICD-10-CM | POA: Insufficient documentation

## 2012-10-30 DIAGNOSIS — J209 Acute bronchitis, unspecified: Secondary | ICD-10-CM | POA: Insufficient documentation

## 2012-10-30 DIAGNOSIS — J40 Bronchitis, not specified as acute or chronic: Secondary | ICD-10-CM

## 2012-10-30 DIAGNOSIS — M129 Arthropathy, unspecified: Secondary | ICD-10-CM | POA: Insufficient documentation

## 2012-10-30 DIAGNOSIS — G4733 Obstructive sleep apnea (adult) (pediatric): Secondary | ICD-10-CM | POA: Insufficient documentation

## 2012-10-30 DIAGNOSIS — J3489 Other specified disorders of nose and nasal sinuses: Secondary | ICD-10-CM | POA: Insufficient documentation

## 2012-10-30 DIAGNOSIS — I10 Essential (primary) hypertension: Secondary | ICD-10-CM | POA: Insufficient documentation

## 2012-10-30 DIAGNOSIS — Z79899 Other long term (current) drug therapy: Secondary | ICD-10-CM | POA: Insufficient documentation

## 2012-10-30 DIAGNOSIS — R0982 Postnasal drip: Secondary | ICD-10-CM | POA: Insufficient documentation

## 2012-10-30 DIAGNOSIS — IMO0002 Reserved for concepts with insufficient information to code with codable children: Secondary | ICD-10-CM | POA: Insufficient documentation

## 2012-10-30 DIAGNOSIS — R51 Headache: Secondary | ICD-10-CM | POA: Insufficient documentation

## 2012-10-30 DIAGNOSIS — F17200 Nicotine dependence, unspecified, uncomplicated: Secondary | ICD-10-CM | POA: Insufficient documentation

## 2012-10-30 DIAGNOSIS — Z8742 Personal history of other diseases of the female genital tract: Secondary | ICD-10-CM | POA: Insufficient documentation

## 2012-10-30 DIAGNOSIS — R509 Fever, unspecified: Secondary | ICD-10-CM | POA: Insufficient documentation

## 2012-10-30 MED ORDER — IBUPROFEN 400 MG PO TABS
600.0000 mg | ORAL_TABLET | Freq: Once | ORAL | Status: AC
  Administered 2012-10-30: 600 mg via ORAL
  Filled 2012-10-30: qty 1

## 2012-10-30 MED ORDER — IBUPROFEN 600 MG PO TABS: 600.0000 mg | ORAL_TABLET | Freq: Four times a day (QID) | ORAL | Status: DC | PRN

## 2012-10-30 MED ORDER — BENZONATATE 100 MG PO CAPS: 100.0000 mg | ORAL_CAPSULE | Freq: Three times a day (TID) | ORAL | Status: DC

## 2012-10-30 MED ORDER — AZITHROMYCIN 250 MG PO TABS: 250.0000 mg | ORAL_TABLET | Freq: Every day | ORAL | Status: DC

## 2012-10-30 MED ORDER — OXYMETAZOLINE HCL 0.05 % NA SOLN: 2.0000 | Freq: Two times a day (BID) | NASAL | Status: DC

## 2012-10-30 MED ORDER — FLUTICASONE PROPIONATE 50 MCG/ACT NA SUSP: 2.0000 | Freq: Every day | NASAL | Status: DC

## 2012-10-30 MED ORDER — BENZONATATE 100 MG PO CAPS
100.0000 mg | ORAL_CAPSULE | Freq: Once | ORAL | Status: AC
  Administered 2012-10-30: 100 mg via ORAL
  Filled 2012-10-30: qty 1

## 2012-10-30 NOTE — ED Notes (Signed)
Family at bedside. 

## 2012-10-30 NOTE — ED Notes (Signed)
Pt. Has noted dry cough with reports of headache.  Pt. Reports this started yesterday.

## 2012-10-30 NOTE — ED Notes (Signed)
Cough, fever, headache and post nasal drainage since yesterday.

## 2012-10-30 NOTE — ED Notes (Signed)
Encouraged Pt. To leave her mask on during assessment and when she coughs.  Pt. Reports to RN she feels like she has a sinus infection.

## 2012-10-30 NOTE — ED Notes (Signed)
Pt was given discharge instructions and explained how to use all of her prescribed medications.  Pt voiced understanding and was directed to the discharge window.

## 2012-10-30 NOTE — ED Provider Notes (Signed)
History  This chart was scribed for Jamie Racer, MD by Shari Heritage, ED Scribe. The patient was seen in room MH01/MH01. Patient's care was started at 2007.  CSN: [id number]  Arrival date & time 10/30/12  1845   First MD Initiated Contact with Patient 10/30/12 2007      Chief Complaint  Patient presents with  . Cough    Patient is a 47 y.o. female presenting with cough and headaches. The history is provided by the patient. No language interpreter was used.  Cough This is a new problem. The current episode started yesterday. The problem occurs constantly. The problem has not changed since onset.The maximum temperature recorded prior to her arrival was 100 to 100.9 F. Associated symptoms include headaches. Pertinent negatives include no ear congestion and no ear pain. Her past medical history is significant for bronchitis.  Headache  This is a new problem. The problem occurs constantly. The problem has not changed since onset.The quality of the pain is described as dull. The pain is moderate. Associated symptoms include a fever.     HPI Comments: [name] is a 47 y.o. female who presents to the Emergency Department complaining of cough and frontal-maxillary facial pain onset yesterday. Patient states that facial pain is worse on the right. There is associated nasal congestion, post nasal drip and fever. Patient denies ear pain or pressure, Patient states that she was diagnosed with bronchitis a few weeks ago and that cough seemed to resolve for a few days until yesterday. She also had a history of sinus infections. Other medical history includes diabetes and HTN.  Past Medical History  Diagnosis Date  . Diabetes mellitus   . Endometriosis   . Hypertension   . Morbid obesity   . Arthritis   . Anxiety   . Obstructive sleep apnea     CPAP NIGHTLY; sleep study 2007.    Past Surgical History  Procedure Date  . Cholecystectomy   . Knee surgery   . Knee arthrocentesis   . Tubal  ligation     Family History  Problem Relation Age of Onset  . Diabetes Mother   . Thyroid disease Mother   . Depression Mother     with anxiety  . Stroke Mother   . Diabetes Father   . Hypertension Father   . Diabetes Sister   . Hypertension Sister   . Hypertension Brother     History  Substance Use Topics  . Smoking status: Current Every Day Smoker -- 0.5 packs/day for 15 years    Types: Cigarettes  . Smokeless tobacco: Never Used  . Alcohol Use: Yes    OB History    Grav Para Term Preterm Abortions TAB SAB Ect Mult Living                  Review of Systems  Constitutional: Positive for fever.  HENT: Positive for congestion and postnasal drip. Negative for ear pain.   Respiratory: Positive for cough.   Neurological: Positive for headaches.  All other systems reviewed and are negative.    Allergies  Darvocet; Lisinopril; Morphine and related; Percocet; and Wellbutrin  Home Medications   Current Outpatient Rx  Name  Route  Sig  Dispense  Refill  . AZITHROMYCIN 250 MG PO TABS   Oral   Take 1 tablet (250 mg total) by mouth daily. Take first 2 tablets together, then 1 every day until finished.   6 tablet   0   . BENZONATATE  100 MG PO CAPS   Oral   Take 1 capsule (100 mg total) by mouth every 8 (eight) hours.   21 capsule   0   . CELECOXIB 200 MG PO CAPS   Oral   Take 200 mg by mouth daily.         Marland Kitchen ZYRTEC PO   Oral   Take 1 tablet by mouth daily.         Marland Kitchen FLUOXETINE HCL 20 MG PO CAPS   Oral   Take 1 capsule (20 mg total) by mouth daily.   30 capsule   5   . FLUTICASONE PROPIONATE 50 MCG/ACT NA SUSP   Nasal   Place 2 sprays into the nose daily.   16 g   0   . FUROSEMIDE 40 MG PO TABS   Oral   Take 40 mg by mouth daily.           Marland Kitchen GLIPIZIDE 10 MG PO TABS   Oral   Take 1 tablet (10 mg total) by mouth 2 (two) times daily before a meal.   60 tablet   5   . HYDROCHLOROTHIAZIDE 25 MG PO TABS   Oral   Take 1 tablet (25 mg total)  by mouth daily.   30 tablet   0   . HYDROCODONE-ACETAMINOPHEN 5-325 MG PO TABS   Oral   Take 1 tablet by mouth every 6 (six) hours as needed for pain.   30 tablet   0   . IBUPROFEN 600 MG PO TABS   Oral   Take 1 tablet (600 mg total) by mouth every 6 (six) hours as needed for pain.   30 tablet   0   . MELOXICAM 15 MG PO TABS   Oral   Take 1 tablet (15 mg total) by mouth daily.   30 tablet   5   . METOPROLOL TARTRATE 25 MG PO TABS   Oral   Take 1 tablet (25 mg total) by mouth 2 (two) times daily.   60 tablet   0   . NAPROXEN SODIUM 220 MG PO TABS      Take 2 tablets twice daily as needed for pain. Best taken on a full stomach.         . OXYMETAZOLINE HCL 0.05 % NA SOLN   Nasal   Place 2 sprays into the nose 2 (two) times daily.   30 mL   0   . POTASSIUM CHLORIDE CRYS ER 20 MEQ PO TBCR   Oral   Take 1 tablet (20 mEq total) by mouth daily.   30 tablet   5   . ZANTAC PO   Oral   Take 1 tablet by mouth 2 (two) times daily. Patient uses this medication for heartburn.           Triage Vitals: BP 150/83  Pulse 109  Temp 100.1 F (37.8 C) (Oral)  Resp 20  SpO2 98%  Physical Exam  Constitutional: She is oriented to person, place, and time. She appears well-developed and well-nourished. No distress.  HENT:  Head: Normocephalic and atraumatic.  Nose: Mucosal edema present. Right sinus exhibits maxillary sinus tenderness.  Mouth/Throat: Oropharynx is clear and moist.       Right maxillary tenderness to percussion. Bilateral nasal mucosa swelling.  Eyes: Conjunctivae normal and EOM are normal. Pupils are equal, round, and reactive to light.  Neck: Normal range of motion. Neck supple.       No  meningismus.  Cardiovascular: Normal rate, regular rhythm and normal heart sounds.   Pulmonary/Chest: Effort normal and breath sounds normal. No respiratory distress. She has no wheezes. She has no rales.  Abdominal: Soft. Bowel sounds are normal.  Musculoskeletal:  Normal range of motion.  Neurological: She is alert and oriented to person, place, and time.       No focal neurological deficits.  Skin: Skin is warm and dry.  Psychiatric: She has a normal mood and affect. Her behavior is normal.    ED Course  Procedures (including critical care time) DIAGNOSTIC STUDIES: Oxygen Saturation is 98% on room air, normal by my interpretation.    COORDINATION OF CARE: 8:23 PM- X-ray shows bronchitis. Symptoms and exam are also consistent with sinusitis. Will give Tessalon 100 mg and ibuprofen 600 mg in the ED. Will prescribe Tessalon, Afrin, Flonase and Zithromax for home treatment. Patient informed of current plan for treatment and agrees with plan at this time.    Dg Chest 2 View  10/30/2012  *RADIOLOGY REPORT*  Clinical Data: Cough  CHEST - 2 VIEW  Comparison: 11/05/2009  Findings: Upper-normal size of cardiac silhouette. Mediastinal contours and pulmonary vascularity normal. Mild chronic peribronchial thickening. No pulmonary infiltrate, pleural effusion or pneumothorax. Bones unremarkable.  IMPRESSION: Mild chronic bronchitic changes.   Original Report Authenticated By: Ulyses Southward, M.D.      1. Sinusitis   2. Bronchitis       MDM  I personally performed the services described in this documentation, which was scribed in my presence. The recorded information has been reviewed and is accurate.    Jamie Racer, MD 10/30/12 [phone]

## 2012-10-30 NOTE — ED Notes (Signed)
MD at bedside. 

## 2012-12-09 ENCOUNTER — Emergency Department (HOSPITAL_COMMUNITY)
Admission: EM | Admit: 2012-12-09 | Discharge: 2012-12-10 | Disposition: A | Discharge status: Home / Self Care | Payer: Self-pay | Attending: Emergency Medicine | Admitting: Emergency Medicine

## 2012-12-09 ENCOUNTER — Encounter (HOSPITAL_COMMUNITY): Payer: Self-pay | Admitting: Emergency Medicine

## 2012-12-09 ENCOUNTER — Emergency Department (HOSPITAL_COMMUNITY): Payer: Self-pay

## 2012-12-09 DIAGNOSIS — Z9851 Tubal ligation status: Secondary | ICD-10-CM | POA: Insufficient documentation

## 2012-12-09 DIAGNOSIS — Z8742 Personal history of other diseases of the female genital tract: Secondary | ICD-10-CM | POA: Insufficient documentation

## 2012-12-09 DIAGNOSIS — F17200 Nicotine dependence, unspecified, uncomplicated: Secondary | ICD-10-CM | POA: Insufficient documentation

## 2012-12-09 DIAGNOSIS — M129 Arthropathy, unspecified: Secondary | ICD-10-CM | POA: Insufficient documentation

## 2012-12-09 DIAGNOSIS — Z87442 Personal history of urinary calculi: Secondary | ICD-10-CM | POA: Insufficient documentation

## 2012-12-09 DIAGNOSIS — I10 Essential (primary) hypertension: Secondary | ICD-10-CM | POA: Insufficient documentation

## 2012-12-09 DIAGNOSIS — Z9089 Acquired absence of other organs: Secondary | ICD-10-CM | POA: Insufficient documentation

## 2012-12-09 DIAGNOSIS — G4733 Obstructive sleep apnea (adult) (pediatric): Secondary | ICD-10-CM | POA: Insufficient documentation

## 2012-12-09 DIAGNOSIS — E119 Type 2 diabetes mellitus without complications: Secondary | ICD-10-CM | POA: Insufficient documentation

## 2012-12-09 DIAGNOSIS — N12 Tubulo-interstitial nephritis, not specified as acute or chronic: Secondary | ICD-10-CM | POA: Insufficient documentation

## 2012-12-09 DIAGNOSIS — F411 Generalized anxiety disorder: Secondary | ICD-10-CM | POA: Insufficient documentation

## 2012-12-09 DIAGNOSIS — R3915 Urgency of urination: Secondary | ICD-10-CM | POA: Insufficient documentation

## 2012-12-09 DIAGNOSIS — Z79899 Other long term (current) drug therapy: Secondary | ICD-10-CM | POA: Insufficient documentation

## 2012-12-09 DIAGNOSIS — R300 Dysuria: Secondary | ICD-10-CM | POA: Insufficient documentation

## 2012-12-09 DIAGNOSIS — R509 Fever, unspecified: Secondary | ICD-10-CM | POA: Insufficient documentation

## 2012-12-09 DIAGNOSIS — R110 Nausea: Secondary | ICD-10-CM | POA: Insufficient documentation

## 2012-12-09 HISTORY — DX: Disorder of kidney and ureter, unspecified: N28.9

## 2012-12-09 LAB — COMPREHENSIVE METABOLIC PANEL
ALT: 23 U/L (ref 0–35)
Alkaline Phosphatase: 99 U/L (ref 39–117)
CO2: 25 mEq/L (ref 19–32)
Calcium: 10 mg/dL (ref 8.4–10.5)
Chloride: 99 mEq/L (ref 96–112)
GFR calc Af Amer: 78 mL/min — ABNORMAL LOW (ref >90)
GFR calc non Af Amer: 67 mL/min — ABNORMAL LOW (ref >90)
Glucose, Bld: 176 mg/dL — ABNORMAL HIGH (ref 70–99)
Sodium: 135 mEq/L (ref 135–145)
Total Bilirubin: 0.3 mg/dL (ref 0.3–1.2)

## 2012-12-09 LAB — URINE MICROSCOPIC-ADD ON

## 2012-12-09 LAB — CBC WITH DIFFERENTIAL/PLATELET
Eosinophils Relative: 1 % (ref 0–5)
HCT: 42.6 % (ref 36.0–46.0)
Lymphocytes Relative: 29 % (ref 12–46)
Lymphs Abs: 4.4 10*3/uL — ABNORMAL HIGH (ref 0.7–4.0)
MCV: 90.4 fL (ref 78.0–100.0)
Platelets: 325 10*3/uL (ref 150–400)
RBC: 4.71 MIL/uL (ref 3.87–5.11)
WBC: 15.4 10*3/uL — ABNORMAL HIGH (ref 4.0–10.5)

## 2012-12-09 LAB — URINALYSIS, ROUTINE W REFLEX MICROSCOPIC
Bilirubin Urine: NEGATIVE
Glucose, UA: NEGATIVE mg/dL
Specific Gravity, Urine: 1.018 (ref 1.005–1.030)
pH: 6.5 (ref 5.0–8.0)

## 2012-12-09 MED ORDER — HYDROMORPHONE HCL PF 1 MG/ML IJ SOLN
1.0000 mg | Freq: Once | INTRAMUSCULAR | Status: AC
  Administered 2012-12-10: 1 mg via INTRAVENOUS
  Filled 2012-12-09: qty 1

## 2012-12-09 MED ORDER — ONDANSETRON HCL 4 MG/2ML IJ SOLN
4.0000 mg | Freq: Once | INTRAMUSCULAR | Status: AC
  Administered 2012-12-10: 4 mg via INTRAVENOUS
  Filled 2012-12-09: qty 2

## 2012-12-09 MED ORDER — DEXTROSE 5 % IV SOLN
1.0000 g | Freq: Once | INTRAVENOUS | Status: AC
  Administered 2012-12-10: 1 g via INTRAVENOUS
  Filled 2012-12-09: qty 10

## 2012-12-09 MED ORDER — KETOROLAC TROMETHAMINE 30 MG/ML IJ SOLN
30.0000 mg | Freq: Once | INTRAMUSCULAR | Status: AC
  Administered 2012-12-10: 30 mg via INTRAVENOUS
  Filled 2012-12-09: qty 1

## 2012-12-09 NOTE — ED Notes (Signed)
Pt states she was dx with a kidney stone several months ago, which has never passed. Today began having severe flank pain. Also painful and frequent urination. Believes she may also have a UTI.

## 2012-12-10 MED ORDER — HYDROCODONE-ACETAMINOPHEN 5-325 MG PO TABS
2.0000 | ORAL_TABLET | Freq: Once | ORAL | Status: AC
  Administered 2012-12-10: 2 via ORAL
  Filled 2012-12-10: qty 2

## 2012-12-10 MED ORDER — CEPHALEXIN 500 MG PO CAPS: 1000.0000 mg | ORAL_CAPSULE | Freq: Two times a day (BID) | ORAL | Status: DC

## 2012-12-10 MED ORDER — HYDROCODONE-ACETAMINOPHEN 5-325 MG PO TABS: 1.0000 | ORAL_TABLET | ORAL | Status: DC | PRN

## 2012-12-10 MED ORDER — PROMETHAZINE HCL 25 MG PO TABS: 25.0000 mg | ORAL_TABLET | Freq: Four times a day (QID) | ORAL | Status: DC | PRN

## 2012-12-10 NOTE — ED Provider Notes (Signed)
History     CSN: [id number]  Arrival date & time 12/09/12  2124   First MD Initiated Contact with Patient 12/09/12 2219      Chief Complaint  Patient presents with  . Urinary Tract Infection  . Flank Pain    (Consider location/radiation/quality/duration/timing/severity/associated sxs/prior treatment) HPI History provided by pt.   Pt presents w/ 2 days of severe R flank pain that radiates to right abd and groin.  Associated w/ low grade fever, max temp 100.6, as well as nausea, dysuria and urgency.   Denies change in bowels and other GU sx.  No relief w/ OTC Azo.  H/o kidney stones and this pain is typical.  Denies recent trauma and heavy lifting.  Does not have a urologist.   Past Medical History  Diagnosis Date  . Diabetes mellitus   . Endometriosis   . Hypertension   . Morbid obesity   . Arthritis   . Anxiety   . Obstructive sleep apnea     CPAP NIGHTLY; sleep study 2007.  Marland Kitchen Renal disorder     kidney stone    Past Surgical History  Procedure Laterality Date  . Cholecystectomy    . Knee surgery    . Knee arthrocentesis    . Tubal ligation      Family History  Problem Relation Age of Onset  . Diabetes Mother   . Thyroid disease Mother   . Depression Mother     with anxiety  . Stroke Mother   . Diabetes Father   . Hypertension Father   . Diabetes Sister   . Hypertension Sister   . Hypertension Brother     History  Substance Use Topics  . Smoking status: Current Every Day Smoker -- 0.50 packs/day for 15 years    Types: Cigarettes  . Smokeless tobacco: Never Used  . Alcohol Use: No    OB History   Grav Para Term Preterm Abortions TAB SAB Ect Mult Living                  Review of Systems  All other systems reviewed and are negative.    Allergies  Darvocet; Lisinopril; Morphine and related; Percocet; and Wellbutrin  Home Medications   Current Outpatient Rx  Name  Route  Sig  Dispense  Refill  . FLUoxetine (PROZAC) 20 MG capsule   Oral  Take 1 capsule (20 mg total) by mouth daily.   30 capsule   5   . glipiZIDE (GLUCOTROL) 10 MG tablet   Oral   Take 1 tablet (10 mg total) by mouth 2 (two) times daily before a meal.   60 tablet   5   . hydrochlorothiazide (HYDRODIURIL) 25 MG tablet   Oral   Take 1 tablet (25 mg total) by mouth daily.   30 tablet   0   . ibuprofen (ADVIL,MOTRIN) 600 MG tablet   Oral   Take 1 tablet (600 mg total) by mouth every 6 (six) hours as needed for pain.   30 tablet   0   . meloxicam (MOBIC) 15 MG tablet   Oral   Take 1 tablet (15 mg total) by mouth daily.   30 tablet   5   . metoprolol tartrate (LOPRESSOR) 25 MG tablet   Oral   Take 1 tablet (25 mg total) by mouth 2 (two) times daily.   60 tablet   0   . oxymetazoline (AFRIN NASAL SPRAY) 0.05 % nasal spray   Nasal  Place 2 sprays into the nose 2 (two) times daily.   30 mL   0   . potassium chloride SA (K-DUR,KLOR-CON) 20 MEQ tablet   Oral   Take 1 tablet (20 mEq total) by mouth daily.   30 tablet   5   . Ranitidine HCl (ZANTAC PO)   Oral   Take 1 tablet by mouth 2 (two) times daily. Patient uses this medication for heartburn.           BP 166/104  Pulse 94  Temp(Src) 100.2 F (37.9 C) (Oral)  Resp 16  SpO2 98%  LMP 11/30/2012  Physical Exam  Nursing note and vitals reviewed. Constitutional: She is oriented to person, place, and time. She appears well-developed and well-nourished. No distress.  Pt sitting on edge of bed; appears uncomfortable.  Morbidly obese.   HENT:  Head: Normocephalic and atraumatic.  Eyes:  Normal appearance  Neck: Normal range of motion.  Cardiovascular: Normal rate and regular rhythm.   Pulmonary/Chest: Effort normal and breath sounds normal. No respiratory distress.  Abdominal: Soft. Bowel sounds are normal. She exhibits no distension and no mass. There is no tenderness. There is no rebound and no guarding.  Genitourinary:  R CVA tenderness  Musculoskeletal: Normal range of  motion.  Neurological: She is alert and oriented to person, place, and time.  Skin: Skin is warm and dry. No rash noted.  Psychiatric: She has a normal mood and affect. Her behavior is normal.    ED Course  Procedures (including critical care time)  Labs Reviewed  URINALYSIS, ROUTINE W REFLEX MICROSCOPIC - Abnormal; Notable for the following:    APPearance CLOUDY (*)    Hgb urine dipstick LARGE (*)    Protein, ur 100 (*)    Nitrite POSITIVE (*)    Leukocytes, UA LARGE (*)    All other components within normal limits  URINE MICROSCOPIC-ADD ON - Abnormal; Notable for the following:    Bacteria, UA FEW (*)    All other components within normal limits  CBC WITH DIFFERENTIAL - Abnormal; Notable for the following:    WBC 15.4 (*)    Neutro Abs 9.6 (*)    Lymphs Abs 4.4 (*)    Monocytes Absolute 1.2 (*)    All other components within normal limits  COMPREHENSIVE METABOLIC PANEL - Abnormal; Notable for the following:    Glucose, Bld 176 (*)    GFR calc non Af Amer 67 (*)    GFR calc Af Amer 78 (*)    All other components within normal limits  LIPASE, BLOOD - Abnormal; Notable for the following:    Lipase 69 (*)    All other components within normal limits  URINE CULTURE   US Renal  12/10/2012  *RADIOLOGY REPORT*  Clinical Data: Bilateral lower back pain.  Pain is worse on the right.  History of kidney stones and cervical cancer.  RENAL/URINARY TRACT ULTRASOUND COMPLETE  Comparison:  CT of the abdomen and pelvis on 06/03/2009  Findings:  Right Kidney:  11.7 cm in length.  No focal mass or hydronephrosis. No definite stones.  Left Kidney:  11.4 cm in length.  No focal mass or hydronephrosis. No definite stones.  Bladder:  Incompletely distended but otherwise normal.  IMPRESSION: Normal exam.   Original Report Authenticated By: Norva Pavlov, M.D.      1. Pyelonephritis       MDM  [phone] F w/ h/o nephrolithiasis presents w/ 2 days of severe, non-traumatic R flank  pain.  Pain  typical of past stones and associated w/ fever, nausea, dysuria and urgency.  On exam, NAD, elevated temp, abd benign, R CVA ttp.  U/A positive for infection and labs otherwise sig for elevated lipase.  No prior history of pancreatitis. Pt is concerned about cost of CT so renal US ordered to look for evidence of kidney stone.  Pt received IVF, rocephin, dilaudid, toradol and zofran.  12:18 AM   Nml renal US.  Results discussed w/ pt.  Pain has improved minimally but nausea resolved and she is tolerating pos.  On re-examination of abdomen, mild suprapubic but no LUQ ttp and abd otherwise benign.  Will give her two vicodin and d/c home w/ same + promethazine and keflex.  Recommended clear fluids x 24 hours in case of mild pancreatitis.  Return precautions discussed.         Otilio Miu, PA-C 12/10/12 [phone]

## 2012-12-10 NOTE — ED Notes (Signed)
Patient is alert and oriented x3.  She was given DC instructions and follow up visit instructions.  Patient gave verbal understanding. She was DC ambulatory under her own power to home.  V/S stable.  He was not showing any signs of distress on DC 

## 2012-12-11 NOTE — ED Provider Notes (Signed)
Medical screening examination/treatment/procedure(s) were performed by non-physician practitioner and as supervising physician I was immediately available for consultation/collaboration.  Lyanne Co, MD 12/11/12 [phone]

## 2012-12-12 LAB — URINE CULTURE

## 2012-12-13 NOTE — ED Notes (Signed)
+   Urine Patient treated with Keflex-sensitive to same-chart appended per protocol MD. 

## 2013-02-03 ENCOUNTER — Ambulatory Visit (INDEPENDENT_AMBULATORY_CARE_PROVIDER_SITE_OTHER): Payer: BC Managed Care – PPO | Admitting: Physician Assistant

## 2013-02-03 VITALS — BP 149/84 | HR 71 | Temp 97.9°F | Resp 18 | Ht 61.25 in | Wt 271.0 lb

## 2013-02-03 DIAGNOSIS — R109 Unspecified abdominal pain: Secondary | ICD-10-CM

## 2013-02-03 DIAGNOSIS — Z760 Encounter for issue of repeat prescription: Secondary | ICD-10-CM

## 2013-02-03 DIAGNOSIS — G4733 Obstructive sleep apnea (adult) (pediatric): Secondary | ICD-10-CM

## 2013-02-03 DIAGNOSIS — N390 Urinary tract infection, site not specified: Secondary | ICD-10-CM

## 2013-02-03 DIAGNOSIS — I10 Essential (primary) hypertension: Secondary | ICD-10-CM

## 2013-02-03 DIAGNOSIS — E119 Type 2 diabetes mellitus without complications: Secondary | ICD-10-CM

## 2013-02-03 DIAGNOSIS — N920 Excessive and frequent menstruation with regular cycle: Secondary | ICD-10-CM

## 2013-02-03 DIAGNOSIS — F411 Generalized anxiety disorder: Secondary | ICD-10-CM

## 2013-02-03 DIAGNOSIS — N946 Dysmenorrhea, unspecified: Secondary | ICD-10-CM

## 2013-02-03 LAB — POCT UA - MICROSCOPIC ONLY

## 2013-02-03 LAB — POCT URINALYSIS DIPSTICK
Protein, UA: >=300
Urobilinogen, UA: 2

## 2013-02-03 MED ORDER — GLIPIZIDE 10 MG PO TABS: 10.0000 mg | ORAL_TABLET | Freq: Two times a day (BID) | ORAL | Status: DC

## 2013-02-03 MED ORDER — KETOROLAC TROMETHAMINE 60 MG/2ML IM SOLN
60.0000 mg | Freq: Once | INTRAMUSCULAR | Status: AC
  Administered 2013-02-03: 60 mg via INTRAMUSCULAR

## 2013-02-03 MED ORDER — MELOXICAM 15 MG PO TABS: 15.0000 mg | ORAL_TABLET | Freq: Every day | ORAL | Status: DC

## 2013-02-03 MED ORDER — HYDROCHLOROTHIAZIDE 25 MG PO TABS: 25.0000 mg | ORAL_TABLET | Freq: Every day | ORAL | Status: DC

## 2013-02-03 MED ORDER — HYDROCODONE-ACETAMINOPHEN 5-325 MG PO TABS: 1.0000 | ORAL_TABLET | ORAL | Status: DC | PRN

## 2013-02-03 MED ORDER — METOPROLOL TARTRATE 25 MG PO TABS: 25.0000 mg | ORAL_TABLET | Freq: Two times a day (BID) | ORAL | Status: DC

## 2013-02-03 MED ORDER — FLUOXETINE HCL 20 MG PO CAPS: 20.0000 mg | ORAL_CAPSULE | Freq: Every day | ORAL | Status: DC

## 2013-02-03 NOTE — Progress Notes (Signed)
[address], [place] [address]   Phone [phone]  Subjective:    Patient ID: [name], female    DOB: 1966/07/17, 47 y.o.   MRN: [id number]  HPI Pt presents to clinic with severe menstrual cramps.  She has h/o dysmenorrhea but her periods have starting to get irregular - mainly she is missing menses then the next month it is much heavier and cramps are much worse.  She has h/o endometriosis so her cramps are normally bad but when she misses a period the pain is so much worse.  She takes either Mobic or Motrin but neither is helping with her current cramping -- She is also having a very heavy flow for the last 2 days passing large clots -- she went through 21 kotex last pm (they were soaked). She is ok with the heavy flow and clots but the pain is why she is here today.  She had to leave work because of the pain.  She smokes about 4 cigs per day.  She has no h/o strokes.  She wants to wait out the bleeding but really cannot deal with the pain.  She knows that her bleeding is worse because she missed it last month.  Her CPAP broke and she would like to have another sleep study.  Before her CPAP broke she felt like she was not rested in the am and she wonders if she needs a titration.  Her sleep study was 2007.    She needs to recheck with Dr Katrinka Blazing and needs a refill of her meds until she can get in with Dr Katrinka Blazing.   Review of Systems  Genitourinary: Positive for vaginal bleeding, menstrual problem and pelvic pain.       Objective:   Physical Exam  Vitals reviewed. Constitutional: She is oriented to person, place, and time. She appears well-developed and well-nourished.  Pt appears uncomfortable has periods of time with grimacing due to cramping.  HENT:  Head: Normocephalic and atraumatic.  Right Ear: External ear normal.  Left Ear: External ear normal.  Pulmonary/Chest: Effort normal.  Neurological: She is alert and oriented to person, place, and time.  Skin: Skin is warm  and dry.  Psychiatric: She has a normal mood and affect. Her behavior is normal. Judgment and thought content normal.       Assessment & Plan:  Dysmenorrhea - Due to her age and significant bleeding will send to gyn.  I d/w her options of -- BCP but due to smoking probably only consider progesterone only pills, ablation or hyst.  Will treat her pain for now but she does not want to stop the bleeding because she knows that "it has to come out"  Plan: HYDROcodone-acetaminophen (NORCO/VICODIN) 5-325 MG per tablet, Ambulatory referral to Gynecology, ketorolac (TORADOL) injection 60 mg, meloxicam (MOBIC) 15 MG tablet  Menorrhagia - Plan: Ambulatory referral to Gynecology  Obstructive sleep apnea - Plan: Ambulatory referral to Sleep Studies  Flank pain - urine micro is impossible to read due to blood so will send for culture because pt is concerned she might have a UTI - Plan: POCT UA - Microscopic Only, POCT urinalysis dipstick  HTN (hypertension) - significant improvement will refill meds and pt will make an appt with Dr Katrinka Blazing for f/u- Plan: metoprolol tartrate (LOPRESSOR) 25 MG tablet, hydrochlorothiazide (HYDRODIURIL) 25 MG tablet  DM (diabetes mellitus), type 2- pt will make an appt with Dr Katrinka Blazing for f/u - Plan: glipiZIDE (GLUCOTROL) 10 MG tablet  Anxiety  state, unspecified - Plan: FLUoxetine (PROZAC) 20 MG capsule   Benny Lennert PA-C 02/03/2013 2:06 PM

## 2013-02-05 LAB — URINE CULTURE

## 2013-02-05 MED ORDER — NITROFURANTOIN MONOHYD MACRO 100 MG PO CAPS: 100.0000 mg | ORAL_CAPSULE | Freq: Two times a day (BID) | ORAL | Status: DC

## 2013-02-05 NOTE — Addendum Note (Signed)
Addended by: Morrell Riddle on: 02/05/2013 09:10 PM   Modules accepted: Orders

## 2013-02-08 ENCOUNTER — Ambulatory Visit: Payer: BC Managed Care – PPO | Admitting: Women's Health

## 2013-02-15 ENCOUNTER — Encounter: Payer: Self-pay | Admitting: Neurology

## 2013-02-15 ENCOUNTER — Institutional Professional Consult (permissible substitution): Payer: BC Managed Care – PPO | Admitting: Neurology

## 2013-02-15 ENCOUNTER — Ambulatory Visit (INDEPENDENT_AMBULATORY_CARE_PROVIDER_SITE_OTHER): Payer: BC Managed Care – PPO | Admitting: Neurology

## 2013-02-15 VITALS — BP 143/85 | HR 73 | Temp 98.3°F | Ht 61.5 in | Wt 258.0 lb

## 2013-02-15 DIAGNOSIS — G4710 Hypersomnia, unspecified: Secondary | ICD-10-CM | POA: Insufficient documentation

## 2013-02-15 DIAGNOSIS — G4733 Obstructive sleep apnea (adult) (pediatric): Secondary | ICD-10-CM

## 2013-02-15 DIAGNOSIS — R519 Headache, unspecified: Secondary | ICD-10-CM

## 2013-02-15 DIAGNOSIS — R51 Headache: Secondary | ICD-10-CM

## 2013-02-15 NOTE — Progress Notes (Signed)
[facility]  Provider:  Dr Vickey Huger Referring Provider: No ref. provider found Primary Care Physician:  Allean Found, MD  Chief Complaint  Patient presents with  . New Evaluation    [place],  [facility],rm 11    HPI:  [name] is a 47 y.o. female here as a referral from Dr. Benny Lennert PA at urgent care. This patient reports that she was diagnosed with sleep apnea probably in the year 2006, and titrated to a CPAP machine. She still on cecal regional machine but it seems not longer to work for her. The patient reports that problems with her machine have been evident for about a year, that she has been unable to use it regularly, as well as gaining weight in that period and developing excessive daytime sleepiness. Today she endorsed the Epworth score at 22 points, and she reports her blood pressure has been increased since she cannot use CPAP. She's not sure about which pressure was set at the machine it maybe around 15 cm water. She used a fullface mask. She recalls vividly how well she slept with CPAP the very first night when she had her test and titration.   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 She falls asleep very frequently and easily, even at work, which has caused problems. She  falls asleep during meals, during conversation and cannot recall when she last watched a whole movie. She began snoring in  Childhood. She cannot really name a specific time she goes to the bedroom as her sleep time, since she takes all many involuntary naps. She has woken herself snoring, she has noted that when she wakes up at times her limbs are shaking she is trembling. She has also noticed that her heart beats out of rhythm, has noticed that her soft palate especially the uvula swells up from her heavy snoring .She has frequent  Nocturia. She wakes up with headaches in the morning but these resolve  over the day. Her family has noted  thunderous snoring, often not allowing another family member to watch a movie in the living room when she snores in her bedroom. The couple sleeps in separate rooms now. She has been told that his morning, groaning and her sleep or even talking gibberish and this has happened even doing naps  And not just a nocturnal sleep time.   The patient also reports that since her  sleep study ( originally in 2006), she had  Gained  30 pounds weight .  Patient's father was allowed snoring with apnea and 2 of her sisters as well. She also reports one brother with sleep apnea.        Review of Systems: Out of a complete 14 system review, the patient complains of only the following symptoms, and all other reviewed systems are negative. Snoring EDS witnessed apnea,   History   Social History  . Marital Status: Married    Spouse Name: N/A    Number of Children: 1  . Years of Education: 12   Occupational History  . general Administrator, [employer]   Social History Main Topics  . Smoking status: Current Every Day Smoker -- 0.50 packs/day for 15 years    Types: Cigarettes  . Smokeless tobacco: Never Used  . Alcohol Use: No  . Drug Use: No  . Sexually Active: Yes   Other Topics Concern  . Not on file  Social History Narrative   Marital status: married x 25 years.      Children:  One son (4), no grandchildren, 6 dogs      Lives: with husband, son.      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        Tobacco: 1/2 ppd x 15 years.      Alcohol: socially; special ocassions.      Drugs: none      Exercise: none    Family History  Problem Relation Age of Onset  . Diabetes Mother   . Thyroid disease Mother   . Depression Mother     with anxiety  . Stroke Mother   . Diabetes Father   . Hypertension Father   . Diabetes Sister   . Hypertension Sister   . Hypertension Brother     Past Medical History  Diagnosis Date  . Diabetes mellitus   . Endometriosis   . Hypertension   . Morbid  obesity   . Arthritis   . Anxiety   . Obstructive sleep apnea     CPAP NIGHTLY; sleep study 2007.  Marland Kitchen Renal disorder     kidney stone  . Hypersomnia, persistent     epworth 16     Past Surgical History  Procedure Laterality Date  . Cholecystectomy    . Knee surgery    . Knee arthrocentesis    . Tubal ligation    . Ablation on endometriosis    . Ablation on endometriosis      10 years ago    Current Outpatient Prescriptions  Medication Sig Dispense Refill  . FLUoxetine (PROZAC) 20 MG capsule Take 1 capsule (20 mg total) by mouth daily.  30 capsule  0  . glipiZIDE (GLUCOTROL) 10 MG tablet Take 1 tablet (10 mg total) by mouth 2 (two) times daily before a meal.  60 tablet  0  . hydrochlorothiazide (HYDRODIURIL) 25 MG tablet Take 1 tablet (25 mg total) by mouth daily.  30 tablet  0  . HYDROcodone-acetaminophen (NORCO/VICODIN) 5-325 MG per tablet Take 1 tablet by mouth every 4 (four) hours as needed for pain.  20 tablet  0  . meloxicam (MOBIC) 15 MG tablet Take 1 tablet (15 mg total) by mouth daily.  30 tablet  0  . metoprolol tartrate (LOPRESSOR) 25 MG tablet Take 1 tablet (25 mg total) by mouth 2 (two) times daily.  60 tablet  0  . potassium chloride SA (K-DUR,KLOR-CON) 20 MEQ tablet Take 1 tablet (20 mEq total) by mouth daily.  30 tablet  5  . Ranitidine HCl (ZANTAC PO) Take 1 tablet by mouth 2 (two) times daily. Patient uses this medication for heartburn.       No current facility-administered medications for this visit.    Allergies as of 02/15/2013 - Review Complete 02/03/2013  Allergen Reaction Noted  . Darvocet (propoxyphene-acetaminophen) Hives and Itching 05/29/2011  . Lisinopril Cough 07/30/2012  . Morphine and related Nausea And Vomiting 05/29/2011  . Percocet (oxycodone-acetaminophen) Itching 12/29/2011  . Wellbutrin (bupropion hcl)  05/29/2011  . Zofran (ondansetron hcl) Itching 02/03/2013    Vitals: BP 143/85  Pulse 73  Temp(Src) 98.3 F (36.8 C) (Oral)  Ht  5' 1.5" (1.562 m)  Wt 258 lb (117.028 kg)  BMI 47.97 kg/m2  LMP 02/02/2013 Last Weight:  Wt Readings from Last 1 Encounters:  02/15/13 258 lb (117.028 kg)   Last Height:   Ht Readings from Last 1 Encounters:  02/15/13 5' 1.5" (1.562  m)    Physical exam:  General: The patient is awake, alert and appears not in acute distress. The patient is well groomed. Head: Normocephalic, atraumatic. Neck is supple. Mallampati3 neck circumference:20 inches, no nasal deviation,  mild retrognathia. Cardiovascular:  Regular rate and rhythm at 16 /min. , without  murmurs or carotid bruit, and without distended neck veins. Respiratory: Lungs are clear to auscultation. Skin:  Without evidence of edema, or rash Trunk: BMI is 50 plus *morbidly obese.   Neurologic exam : The patient is awake and alert, oriented to place and time.  Memory subjective  described as intact. There is a normal attention span & concentration ability. Speech is fluent without  dysarthria, dysphonia or aphasia. Mood and affect are appropriate.  Cranial nerves: Pupils are equal and briskly reactive to light. Funduscopic exam without  evidence of pallor or edema. Extraocular movements  in vertical and horizontal planes intact and without nystagmus. Visual fields by finger perimetry are intact. Hearing to finger rub intact.  Facial sensation intact to fine touch. Facial motor strength is symmetric and tongue and uvula move midline.  Motor exam: Normal tone and normal muscle bulk and symmetric normal strength in all extremities.  Sensory:  Fine touch, pinprick and vibration were tested in all extremities. Proprioception is tested in the upper extremities only. This was  normal.  Coordination: Rapid alternating movements in the fingers/hands is tested and normal. Finger-to-nose maneuver tested and normal without evidence of ataxia, dysmetria or tremor.  Gait and station: Patient walks without assistive device and is able and assisted  stool climb up to the exam table. Strength within normal limits.     Mrs. Payer presents as an excessively daytime sleepy patient. Epworth score is 22 points. Her main risk factor is her body mass index, however she also has retrognathia which may have caused apnea and loud snoring even in childhood. We have no access to her previous sleep study, but her clinical symptomatic and her physical findings make an urgent split study necessary. This patient needs to be diagnosed with the degree of apnea she currently presents with, as well as titrated as quickly as possible to initiate home therapy again.  Mrs. Louischarles core morbidity such as high blood pressure, morning headaches and diabetes may all benefit from control of obstructive sleep apnea. We discussed the test and therapy as well as the risks of apnea in detail - I have printed some educational material that the patient can take home today I would like her to have her sleep study ASAP.

## 2013-02-15 NOTE — Patient Instructions (Signed)
Exercise to Lose Weight Exercise and a healthy diet may help you lose weight. Your doctor may suggest specific exercises. EXERCISE IDEAS AND TIPS  Choose low-cost things you enjoy doing, such as walking, bicycling, or exercising to workout videos.  Take stairs instead of the elevator.  Walk during your lunch break.  Park your car further away from work or school.  Go to a gym or an exercise class.  Start with 5 to 10 minutes of exercise each day. Build up to 30 minutes of exercise 4 to 6 days a week.  Wear shoes with good support and comfortable clothes.  Stretch before and after working out.  Work out until you breathe harder and your heart beats faster.  Drink extra water when you exercise.  Do not do so much that you hurt yourself, feel dizzy, or get very short of breath. Exercises that burn about 150 calories:  Running 1  miles in 15 minutes.  Playing volleyball for 45 to 60 minutes.  Washing and waxing a car for 45 to 60 minutes.  Playing touch football for 45 minutes.  Walking 1  miles in 35 minutes.  Pushing a stroller 1  miles in 30 minutes.  Playing basketball for 30 minutes.  Raking leaves for 30 minutes.  Bicycling 5 miles in 30 minutes.  Walking 2 miles in 30 minutes.  Dancing for 30 minutes.  Shoveling snow for 15 minutes.  Swimming laps for 20 minutes.  Walking up stairs for 15 minutes.  Bicycling 4 miles in 15 minutes.  Gardening for 30 to 45 minutes.  Jumping rope for 15 minutes.  Washing windows or floors for 45 to 60 minutes. Document Released: 10/16/2010 Document Revised: 12/06/2011 Document Reviewed: 10/16/2010 [facility] Patient Information 2014 [place], [place]. CPAP and BIPAP CPAP and BIPAP are methods of helping you breathe. CPAP stands for "continuous positive airway pressure." BIPAP stands for "bi-level positive airway pressure." Both CPAP and BIPAP are provided by a small machine with a flexible plastic tube that  attaches to a plastic mask that goes over your nose or mouth. Air is blown into your air passages through your nose or mouth. This helps to keep your airways open and helps to keep you breathing well. The amount of pressure that is used to blow the air into your air passages can be set on the machine. The pressure setting is based on your needs. With CPAP, the amount of pressure stays the same while you breathe in and out. With BIPAP, the amount of pressure changes when you inhale and exhale. Your caregiver will recommend whether CPAP or BIPAP would be more helpful for you.  CPAP and BIPAP can be helpful for both adults and children with:  Sleep apnea.  Chronic Obstructive Pulmonary Disease (COPD), a condition like emphysema.  Diseases which weaken the muscles of the chest such as muscular dystrophy or neurological diseases.  Other problems that cause breathing to be weak or difficult. USE OF CPAP OR BIPAP The respiratory therapist or technician will help you get used to wearing the mask. Some people feel claustrophobic (a trapped or closed in feeling) at first, because the mask needs to be fairly snug on your face.   It may help you to get used to the mask gradually, by first holding the mask loosely over your nose or mouth using a low pressure setting on the machine. Gradually the mask can be applied more snugly with increased pressure. You can also gradually increase the amount  of time the mask is used.  People with sleep apnea will use the mask and machine at night when they are sleeping. Others, like those with ALS or other breathing difficulties, may need the CPAP or BIPAP all the time.  If the first mask you try does not fit well, or is uncomfortable, there are other types and sizes that can be tried.  If you tend to breathe through your mouth, a chin strap may be applied to help keep your mouth closed (if you are using a nasal mask).  The CPAP and BIPAP machines have alarms that may  sound if the mask comes off or develops a leak.  You should not eat or drink while the CPAP or BIPAP is on. Food or fluids could get pushed into your lungs by the pressure of the CPAP or BIPAP. Sometimes CPAP or BIPAP machines are ordered for home use. If you are going to use the CPAP or BIPAP machine at home, follow these instructions  CPAP or BIPAP machines can be rented or purchased through home health care companies. There are many different brands of machines available. If you rent a machine before purchasing you may find which particular machine works well for you.  Ask questions if there is something you do not understand when picking out your machine.  Place your CPAP or BIPAP machine on a secure table or stand near an electrical outlet.  Know where the On/Off switch is.  Follow your doctor's instructions for how to set the pressure on your machine and when you should use it.  Do not smoke! Tobacco smoke residue can damage the machine. SEEK IMMEDIATE MEDICAL CARE IF:   You have redness or open areas around your nose or mouth.  You have trouble operating the CPAP or BIPAP machine.  You cannot tolerate wearing the CPAP or BIPAP mask.  You have any questions or concerns. Document Released: 06/11/2004 Document Revised: 12/06/2011 Document Reviewed: 09/10/2008 [employer] Patient Information 2014 [place], [place]. Sleep Apnea  Sleep apnea is a sleep disorder characterized by abnormal pauses in breathing while you sleep. When your breathing pauses, the level of oxygen in your blood decreases. This causes you to move out of deep sleep and into light sleep. As a result, your quality of sleep is poor, and the system that carries your blood throughout your body (cardiovascular system) experiences stress. If sleep apnea remains untreated, the following conditions can develop:  High blood pressure (hypertension).  Coronary artery disease.  Inability to achieve or maintain an erection  (impotence).  Impairment of your thought process (cognitive dysfunction). There are three types of sleep apnea: 1. Obstructive sleep apnea Pauses in breathing during sleep because of a blocked airway. 2. Central sleep apnea Pauses in breathing during sleep because the area of the brain that controls your breathing does not send the correct signals to the muscles that control breathing. 3. Mixed sleep apnea A combination of both obstructive and central sleep apnea. RISK FACTORS The following risk factors can increase your risk of developing sleep apnea:  Being overweight.  Smoking.  Having narrow passages in your nose and throat.  Being of older age.  Being female.  Alcohol use.  Sedative and tranquilizer use.  Ethnicity. Among individuals younger than 35 years, African Americans are at increased risk of sleep apnea. SYMPTOMS   Difficulty staying asleep.  Daytime sleepiness and fatigue.  Loss of energy.  Irritability.  Loud, heavy snoring.  Morning headaches.  Trouble concentrating.  Forgetfulness.  Decreased interest in sex. DIAGNOSIS  In order to diagnose sleep apnea, your caregiver will perform a physical examination. Your caregiver may suggest that you take a home sleep test. Your caregiver may also recommend that you spend the night in a sleep lab. In the sleep lab, several monitors record information about your heart, lungs, and brain while you sleep. Your leg and arm movements and blood oxygen level are also recorded. TREATMENT The following actions may help to resolve mild sleep apnea:  Sleeping on your side.   Using a decongestant if you have nasal congestion.   Avoiding the use of depressants, including alcohol, sedatives, and narcotics.   Losing weight and modifying your diet if you are overweight. There also are devices and treatments to help open your airway:  Oral appliances. These are custom-made mouthpieces that shift your lower jaw forward  and slightly open your bite. This opens your airway.  Devices that create positive airway pressure. This positive pressure "splints" your airway open to help you breathe better during sleep. The following devices create positive airway pressure:  Continuous positive airway pressure (CPAP) device. The CPAP device creates a continuous level of air pressure with an air pump. The air is delivered to your airway through a mask while you sleep. This continuous pressure keeps your airway open.  Nasal expiratory positive airway pressure (EPAP) device. The EPAP device creates positive air pressure as you exhale. The device consists of single-use valves, which are inserted into each nostril and held in place by adhesive. The valves create very little resistance when you inhale but create much more resistance when you exhale. That increased resistance creates the positive airway pressure. This positive pressure while you exhale keeps your airway open, making it easier to breath when you inhale again.  Bilevel positive airway pressure (BPAP) device. The BPAP device is used mainly in patients with central sleep apnea. This device is similar to the CPAP device because it also uses an air pump to deliver continuous air pressure through a mask. However, with the BPAP machine, the pressure is set at two different levels. The pressure when you exhale is lower than the pressure when you inhale.  Surgery. Typically, surgery is only done if you cannot comply with less invasive treatments or if the less invasive treatments do not improve your condition. Surgery involves removing excess tissue in your airway to create a wider passage way. Document Released: 09/03/2002 Document Revised: 03/14/2012 Document Reviewed: 01/20/2012 [facility] Patient Information 2014 [place], [place].

## 2013-02-20 ENCOUNTER — Ambulatory Visit: Payer: BC Managed Care – PPO | Admitting: Gynecology

## 2013-03-07 ENCOUNTER — Ambulatory Visit (INDEPENDENT_AMBULATORY_CARE_PROVIDER_SITE_OTHER): Payer: BC Managed Care – PPO | Admitting: Neurology

## 2013-03-07 DIAGNOSIS — G4733 Obstructive sleep apnea (adult) (pediatric): Secondary | ICD-10-CM

## 2013-03-07 DIAGNOSIS — R519 Headache, unspecified: Secondary | ICD-10-CM

## 2013-03-09 ENCOUNTER — Telehealth: Payer: Self-pay | Admitting: Neurology

## 2013-03-09 NOTE — Telephone Encounter (Signed)
States she had a sleep study on Wed. Night and slept amazing with the mask.   Also felt great on Thursday.   Wants to see if there could be a rush on getting her.

## 2013-03-14 ENCOUNTER — Telehealth: Payer: Self-pay | Admitting: *Deleted

## 2013-03-14 DIAGNOSIS — G4733 Obstructive sleep apnea (adult) (pediatric): Secondary | ICD-10-CM

## 2013-03-14 NOTE — Telephone Encounter (Signed)
Called patient to discuss sleep study results from  03/07/2013 .  Discussed findings, recommendations and follow up care.  Patient understood well and all questions were answered.  SHE FELT GREAT THE NEXT DAY AND WANTS TO GET STARTED RIGHT AWAY!  Orders will be placed to [facility] first thing tomorrow morning with STAT instructions.  Follow up letter and copy of report will be mailed to patient.  Copy or results will be mailed to referring MD and PCP.

## 2013-03-15 ENCOUNTER — Encounter: Payer: Self-pay | Admitting: *Deleted

## 2013-03-15 NOTE — Progress Notes (Signed)
See media tab for full report  

## 2013-03-20 ENCOUNTER — Ambulatory Visit: Payer: BC Managed Care – PPO | Admitting: Gynecology

## 2013-03-21 ENCOUNTER — Telehealth: Payer: Self-pay

## 2013-03-21 DIAGNOSIS — N946 Dysmenorrhea, unspecified: Secondary | ICD-10-CM

## 2013-03-21 NOTE — Telephone Encounter (Signed)
When she was here last month, she was to schedule follow up for her DM with Dr. Katrinka Blazing, but I don't see that she's done that.  I do see appointments with Dr. Lily Peer (OBGYN) and Dr. Vickey Huger (neurology).  What's her plan?

## 2013-03-21 NOTE — Telephone Encounter (Signed)
Pt would like pain medication called in for her painful periods, she has an appt set up with a OBGYN in July. [phone] Pharmacy: [address]

## 2013-03-22 NOTE — Telephone Encounter (Signed)
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 She has taken vicodin in the past and has ran out requesting refill.

## 2013-03-23 ENCOUNTER — Other Ambulatory Visit: Payer: Self-pay | Admitting: Radiology

## 2013-03-23 MED ORDER — HYDROCODONE-ACETAMINOPHEN 5-325 MG PO TABS: 1.0000 | ORAL_TABLET | ORAL | Status: DC | PRN

## 2013-03-23 NOTE — Telephone Encounter (Signed)
I can give her some more pain meds but her f/u with Dr Katrinka Blazing about her DM has nothing to do with her painful periods.

## 2013-03-23 NOTE — Telephone Encounter (Signed)
Pt advised Rx sent in 

## 2013-04-10 ENCOUNTER — Ambulatory Visit: Payer: BC Managed Care – PPO | Admitting: Gynecology

## 2013-04-19 ENCOUNTER — Emergency Department (HOSPITAL_COMMUNITY)
Admission: EM | Admit: 2013-04-19 | Discharge: 2013-04-19 | Disposition: A | Discharge status: Home / Self Care | Payer: BC Managed Care – PPO | Attending: Emergency Medicine | Admitting: Emergency Medicine

## 2013-04-19 ENCOUNTER — Encounter (HOSPITAL_COMMUNITY): Payer: Self-pay | Admitting: *Deleted

## 2013-04-19 ENCOUNTER — Emergency Department (HOSPITAL_COMMUNITY): Payer: BC Managed Care – PPO

## 2013-04-19 DIAGNOSIS — R109 Unspecified abdominal pain: Secondary | ICD-10-CM | POA: Insufficient documentation

## 2013-04-19 DIAGNOSIS — R350 Frequency of micturition: Secondary | ICD-10-CM | POA: Insufficient documentation

## 2013-04-19 DIAGNOSIS — F17200 Nicotine dependence, unspecified, uncomplicated: Secondary | ICD-10-CM | POA: Insufficient documentation

## 2013-04-19 DIAGNOSIS — I10 Essential (primary) hypertension: Secondary | ICD-10-CM | POA: Insufficient documentation

## 2013-04-19 DIAGNOSIS — Z87442 Personal history of urinary calculi: Secondary | ICD-10-CM | POA: Insufficient documentation

## 2013-04-19 DIAGNOSIS — Z9851 Tubal ligation status: Secondary | ICD-10-CM | POA: Insufficient documentation

## 2013-04-19 DIAGNOSIS — Z3202 Encounter for pregnancy test, result negative: Secondary | ICD-10-CM | POA: Insufficient documentation

## 2013-04-19 DIAGNOSIS — R509 Fever, unspecified: Secondary | ICD-10-CM | POA: Insufficient documentation

## 2013-04-19 DIAGNOSIS — Z8742 Personal history of other diseases of the female genital tract: Secondary | ICD-10-CM | POA: Insufficient documentation

## 2013-04-19 DIAGNOSIS — Z87448 Personal history of other diseases of urinary system: Secondary | ICD-10-CM | POA: Insufficient documentation

## 2013-04-19 DIAGNOSIS — G4733 Obstructive sleep apnea (adult) (pediatric): Secondary | ICD-10-CM | POA: Insufficient documentation

## 2013-04-19 DIAGNOSIS — Z791 Long term (current) use of non-steroidal anti-inflammatories (NSAID): Secondary | ICD-10-CM | POA: Insufficient documentation

## 2013-04-19 DIAGNOSIS — Z8659 Personal history of other mental and behavioral disorders: Secondary | ICD-10-CM | POA: Insufficient documentation

## 2013-04-19 DIAGNOSIS — Z8739 Personal history of other diseases of the musculoskeletal system and connective tissue: Secondary | ICD-10-CM | POA: Insufficient documentation

## 2013-04-19 DIAGNOSIS — F411 Generalized anxiety disorder: Secondary | ICD-10-CM | POA: Insufficient documentation

## 2013-04-19 DIAGNOSIS — R110 Nausea: Secondary | ICD-10-CM | POA: Insufficient documentation

## 2013-04-19 DIAGNOSIS — Z79899 Other long term (current) drug therapy: Secondary | ICD-10-CM | POA: Insufficient documentation

## 2013-04-19 DIAGNOSIS — Z9089 Acquired absence of other organs: Secondary | ICD-10-CM | POA: Insufficient documentation

## 2013-04-19 DIAGNOSIS — E119 Type 2 diabetes mellitus without complications: Secondary | ICD-10-CM | POA: Insufficient documentation

## 2013-04-19 LAB — COMPREHENSIVE METABOLIC PANEL
ALT: 28 U/L (ref 0–35)
AST: 32 U/L (ref 0–37)
Calcium: 9.3 mg/dL (ref 8.4–10.5)
Potassium: 4.2 mEq/L (ref 3.5–5.1)
Sodium: 133 mEq/L — ABNORMAL LOW (ref 135–145)
Total Protein: 6.7 g/dL (ref 6.0–8.3)

## 2013-04-19 LAB — CBC
MCH: 31.3 pg (ref 26.0–34.0)
MCHC: 34.5 g/dL (ref 30.0–36.0)
Platelets: 259 10*3/uL (ref 150–400)
RBC: 4.5 MIL/uL (ref 3.87–5.11)

## 2013-04-19 LAB — URINE MICROSCOPIC-ADD ON

## 2013-04-19 LAB — URINALYSIS, ROUTINE W REFLEX MICROSCOPIC
Glucose, UA: >1000 mg/dL — AB
Hgb urine dipstick: NEGATIVE
Ketones, ur: >80 mg/dL — AB
Leukocytes, UA: NEGATIVE
Nitrite: NEGATIVE
Protein, ur: NEGATIVE mg/dL
Urobilinogen, UA: 0.2 mg/dL (ref 0.0–1.0)
pH: 5.5 (ref 5.0–8.0)

## 2013-04-19 LAB — GLUCOSE, CAPILLARY: Glucose-Capillary: 259 mg/dL — ABNORMAL HIGH (ref 70–99)

## 2013-04-19 MED ORDER — PROMETHAZINE HCL 25 MG/ML IJ SOLN
25.0000 mg | Freq: Once | INTRAMUSCULAR | Status: AC
  Administered 2013-04-19: 25 mg via INTRAVENOUS
  Filled 2013-04-19: qty 1

## 2013-04-19 MED ORDER — INSULIN ASPART 100 UNIT/ML ~~LOC~~ SOLN
10.0000 [IU] | Freq: Once | SUBCUTANEOUS | Status: AC
  Administered 2013-04-19: 10 [IU] via SUBCUTANEOUS
  Filled 2013-04-19: qty 1

## 2013-04-19 MED ORDER — HYDROMORPHONE HCL PF 1 MG/ML IJ SOLN
1.0000 mg | Freq: Once | INTRAMUSCULAR | Status: AC
  Administered 2013-04-19: 1 mg via INTRAVENOUS
  Filled 2013-04-19: qty 1

## 2013-04-19 MED ORDER — PROMETHAZINE HCL 25 MG PO TABS: 25.0000 mg | ORAL_TABLET | Freq: Four times a day (QID) | ORAL | Status: DC | PRN

## 2013-04-19 MED ORDER — HYDROCODONE-ACETAMINOPHEN 5-325 MG PO TABS: 2.0000 | ORAL_TABLET | ORAL | Status: DC | PRN

## 2013-04-19 MED ORDER — SODIUM CHLORIDE 0.9 % IV BOLUS (SEPSIS)
1000.0000 mL | Freq: Once | INTRAVENOUS | Status: AC
  Administered 2013-04-19: 1000 mL via INTRAVENOUS

## 2013-04-19 NOTE — ED Notes (Signed)
Patient transported to CT 

## 2013-04-19 NOTE — ED Provider Notes (Signed)
History    CSN: [id number] Arrival date & time 04/19/13  4098  First MD Initiated Contact with Patient 04/19/13 [phone]     Chief Complaint  Patient presents with  . Flank Pain   (Consider location/radiation/quality/duration/timing/severity/associated sxs/prior Treatment) HPI Pt presenting with c/o right sided flank pain, radiates somewhat to right lower abdomen.  She also reports urinary frequency.  She has been treated over the past several months for pyelonephritis with abx- last approx 2 months ago.  She did have some fever this morning.  Also nausea, no vomiting.  She states this feels similar to prior kidney stones.  States she has not taken glipizide for the past 2 days.  Prior to that her blood glucose was under good control.  There are no other associated systemic symptoms, there are no other alleviating or modifying factors.  Past Medical History  Diagnosis Date  . Diabetes mellitus   . Endometriosis   . Hypertension   . Morbid obesity   . Arthritis   . Anxiety   . Obstructive sleep apnea     CPAP NIGHTLY; sleep study 2007.  Marland Kitchen Renal disorder     kidney stone  . Hypersomnia, persistent     epworth 16    Past Surgical History  Procedure Laterality Date  . Cholecystectomy    . Knee surgery    . Knee arthrocentesis    . Tubal ligation    . Ablation on endometriosis    . Ablation on endometriosis      10 years ago   Family History  Problem Relation Age of Onset  . Diabetes Mother   . Thyroid disease Mother   . Depression Mother     with anxiety  . Stroke Mother   . Diabetes Father   . Hypertension Father   . Diabetes Sister   . Hypertension Sister   . Hypertension Brother    History  Substance Use Topics  . Smoking status: Current Every Day Smoker -- 0.50 packs/day for 15 years    Types: Cigarettes  . Smokeless tobacco: Never Used  . Alcohol Use: No   OB History   Grav Para Term Preterm Abortions TAB SAB Ect Mult Living                 Review of  Systems ROS reviewed and all otherwise negative except for mentioned in HPI  Allergies  Darvocet; Lisinopril; Morphine and related; Percocet; Wellbutrin; and Zofran  Home Medications   Current Outpatient Rx  Name  Route  Sig  Dispense  Refill  . acetaminophen (TYLENOL) 500 MG tablet   Oral   Take 500 mg by mouth every 6 (six) hours as needed for pain.         Marland Kitchen FLUoxetine (PROZAC) 20 MG capsule   Oral   Take 1 capsule (20 mg total) by mouth daily.   30 capsule   0   . glipiZIDE (GLUCOTROL) 10 MG tablet   Oral   Take 1 tablet (10 mg total) by mouth 2 (two) times daily before a meal.   60 tablet   0   . hydrochlorothiazide (HYDRODIURIL) 25 MG tablet   Oral   Take 1 tablet (25 mg total) by mouth daily.   30 tablet   0   . HYDROcodone-acetaminophen (NORCO/VICODIN) 5-325 MG per tablet   Oral   Take 1 tablet by mouth every 4 (four) hours as needed for pain.   20 tablet   0   .  meloxicam (MOBIC) 15 MG tablet   Oral   Take 1 tablet (15 mg total) by mouth daily.   30 tablet   0   . metoprolol tartrate (LOPRESSOR) 25 MG tablet   Oral   Take 1 tablet (25 mg total) by mouth 2 (two) times daily.   60 tablet   0   . potassium chloride SA (K-DUR,KLOR-CON) 20 MEQ tablet   Oral   Take 1 tablet (20 mEq total) by mouth daily.   30 tablet   5   . Ranitidine HCl (ZANTAC PO)   Oral   Take 1 tablet by mouth 2 (two) times daily. Patient uses this medication for heartburn.         Marland Kitchen HYDROcodone-acetaminophen (NORCO/VICODIN) 5-325 MG per tablet   Oral   Take 2 tablets by mouth every 4 (four) hours as needed for pain.   15 tablet   0   . promethazine (PHENERGAN) 25 MG tablet   Oral   Take 1 tablet (25 mg total) by mouth every 6 (six) hours as needed for nausea.   30 tablet   0    BP 142/82  Pulse 80  Temp(Src) 98.9 F (37.2 C) (Oral)  Resp 16  SpO2 97%  LMP 02/17/2013 Vitals reviewed Physical Exam Physical Examination: General appearance - alert, well  appearing, and in no distress Mental status - alert, oriented to person, place, and time Eyes - no conjunctival injection, no scleral icterus Mouth - mucous membranes moist, pharynx normal without lesions Chest - clear to auscultation, no wheezes, rales or rhonchi, symmetric air entry Heart - normal rate, regular rhythm, normal S1, S2, no murmurs, rubs, clicks or gallops Abdomen - soft, mild tenderness to palpation, no gaurding or rebound tenderness, nondistended, no masses or organomegaly Back exam - full range of motion, no midline tenderness, mild right CVA tenderness Extremities - peripheral pulses normal, no pedal edema, no clubbing or cyanosis Skin - normal coloration and turgor, no rashes, warm and dry   ED Course  Procedures (including critical care time)  2:25 PM prior records reviewed- she has had ecoli positive urine cultures in March and May- both these had positive urinalysis results for infection as well.  Today UA is reassuring, except the glucose and ketones.  Pt does not have anion gap on BMP.  Glucose has decreased with fluids and insulin.  Urine culture is sent and she will be contacted if this grows bacteria requiring treatment.   Labs Reviewed  URINALYSIS, ROUTINE W REFLEX MICROSCOPIC - Abnormal; Notable for the following:    Color, Urine AMBER (*)    Specific Gravity, Urine 1.035 (*)    Glucose, UA >1000 (*)    Ketones, ur >80 (*)    All other components within normal limits  CBC - Abnormal; Notable for the following:    WBC 11.2 (*)    All other components within normal limits  COMPREHENSIVE METABOLIC PANEL - Abnormal; Notable for the following:    Sodium 133 (*)    Chloride 95 (*)    Glucose, Bld 356 (*)    Albumin 3.2 (*)    All other components within normal limits  GLUCOSE, CAPILLARY - Abnormal; Notable for the following:    Glucose-Capillary 259 (*)    All other components within normal limits  PREGNANCY, URINE  URINE MICROSCOPIC-ADD ON   Ct Abdomen  Pelvis Wo Contrast  04/19/2013   *RADIOLOGY REPORT*  Clinical Data: Right-sided flank pain, some nausea, history of prior  kidney stones  CT ABDOMEN AND PELVIS WITHOUT CONTRAST  Technique:  Multidetector CT imaging of the abdomen and pelvis was performed following the standard protocol without intravenous contrast.  Comparison: CT abdomen pelvis of 06/03/2009  Findings: The lung bases are clear.  As noted on the prior CT, there is diffuse fatty infiltration of the liver.  No focal hepatic lesion is seen.  Surgical clips are present from prior cholecystectomy.  The pancreas appears somewhat atrophic but the pancreatic duct is not dilated.  The adrenal glands and spleen are unremarkable.  The stomach is decompressed and cannot be well evaluated.  No definite renal calculi are noted and there is no evidence of hydronephrosis.  The ureters are normal in caliber. The abdominal aorta appears normal in caliber.  No adenopathy is seen.  The distal ureters are normal in caliber and no distal ureteral calculus is noted.  The urinary bladder is decompressed.  The uterus is anteverted and no adnexal lesion is seen.  The complex cyst on the left noted previously is no longer seen with only a small left ovarian cyst present of 2.3 cm in diameter.  No free fluid is seen.  There are scattered descending colon and rectosigmoid colon diverticula present.  There appears to be mild malrotation of bowel, with the SMA and SMV reversing positions, and the cecum being located in the midline.  However the duodenal loop does appear to cross midline to the ligament of Treitz. No bowel obstruction is seen.  A small umbilical hernia is present containing only fat.  Degenerative joint disease involves both hips.  The lumbar vertebrae are in normal alignment.  IMPRESSION:  1.  No hydronephrosis.  No renal or ureteral calculi are noted. 2.  Diffuse fatty infiltration of the liver. 3.  Small umbilical hernia containing only fat which is stable.    Original Report Authenticated By: Dwyane Dee, M.D.   1. Flank pain     MDM  Pt presenting with c/o pain in right low back and abdomen.  Labs reveal mild elevation in WBCs, abdominal CT scan obtained and shows no renal stones, no acute abnormality.  Urine culture pending.  Pt feels improved after pain and nausea meds.  All results d/w patient at the bedside.  She will arrange for close followup with her doctor. Discharged with strict return precautions.  Pt agreeable with plan.  Ethelda Chick, MD 04/19/13 [phone]

## 2013-04-19 NOTE — ED Notes (Signed)
Pt reports right sided flank pain, radiates to right side of lower back and abd. +nausea. Hx of multiple kidney infections and stones. Reports fever of 101.3 this morning, took 2 tylenol before coming to ED

## 2013-04-27 ENCOUNTER — Institutional Professional Consult (permissible substitution): Payer: BC Managed Care – PPO | Admitting: Neurology

## 2013-04-27 ENCOUNTER — Encounter: Payer: Self-pay | Admitting: Neurology

## 2013-05-24 ENCOUNTER — Emergency Department (HOSPITAL_COMMUNITY)
Admission: EM | Admit: 2013-05-24 | Discharge: 2013-05-24 | Disposition: A | Discharge status: Home / Self Care | Payer: BC Managed Care – PPO | Attending: Emergency Medicine | Admitting: Emergency Medicine

## 2013-05-24 ENCOUNTER — Encounter (HOSPITAL_COMMUNITY): Payer: Self-pay | Admitting: *Deleted

## 2013-05-24 DIAGNOSIS — F17200 Nicotine dependence, unspecified, uncomplicated: Secondary | ICD-10-CM | POA: Insufficient documentation

## 2013-05-24 DIAGNOSIS — R51 Headache: Secondary | ICD-10-CM | POA: Insufficient documentation

## 2013-05-24 DIAGNOSIS — Z792 Long term (current) use of antibiotics: Secondary | ICD-10-CM | POA: Insufficient documentation

## 2013-05-24 DIAGNOSIS — R21 Rash and other nonspecific skin eruption: Secondary | ICD-10-CM

## 2013-05-24 DIAGNOSIS — F411 Generalized anxiety disorder: Secondary | ICD-10-CM | POA: Insufficient documentation

## 2013-05-24 DIAGNOSIS — I10 Essential (primary) hypertension: Secondary | ICD-10-CM | POA: Insufficient documentation

## 2013-05-24 DIAGNOSIS — E119 Type 2 diabetes mellitus without complications: Secondary | ICD-10-CM | POA: Insufficient documentation

## 2013-05-24 DIAGNOSIS — G4733 Obstructive sleep apnea (adult) (pediatric): Secondary | ICD-10-CM | POA: Insufficient documentation

## 2013-05-24 DIAGNOSIS — R197 Diarrhea, unspecified: Secondary | ICD-10-CM | POA: Insufficient documentation

## 2013-05-24 DIAGNOSIS — M2550 Pain in unspecified joint: Secondary | ICD-10-CM | POA: Insufficient documentation

## 2013-05-24 DIAGNOSIS — Z8742 Personal history of other diseases of the female genital tract: Secondary | ICD-10-CM | POA: Insufficient documentation

## 2013-05-24 DIAGNOSIS — Z87442 Personal history of urinary calculi: Secondary | ICD-10-CM | POA: Insufficient documentation

## 2013-05-24 DIAGNOSIS — Z8739 Personal history of other diseases of the musculoskeletal system and connective tissue: Secondary | ICD-10-CM | POA: Insufficient documentation

## 2013-05-24 DIAGNOSIS — Z8659 Personal history of other mental and behavioral disorders: Secondary | ICD-10-CM | POA: Insufficient documentation

## 2013-05-24 DIAGNOSIS — R110 Nausea: Secondary | ICD-10-CM | POA: Insufficient documentation

## 2013-05-24 MED ORDER — SODIUM CHLORIDE 0.9 % IV BOLUS (SEPSIS)
1000.0000 mL | Freq: Once | INTRAVENOUS | Status: AC
  Administered 2013-05-24: 1000 mL via INTRAVENOUS

## 2013-05-24 MED ORDER — CETIRIZINE HCL 10 MG PO TABS: 10.0000 mg | ORAL_TABLET | Freq: Every day | ORAL | Status: DC

## 2013-05-24 MED ORDER — FAMOTIDINE IN NACL 20-0.9 MG/50ML-% IV SOLN
20.0000 mg | Freq: Once | INTRAVENOUS | Status: AC
  Administered 2013-05-24: 20 mg via INTRAVENOUS
  Filled 2013-05-24: qty 50

## 2013-05-24 MED ORDER — DIPHENHYDRAMINE HCL 50 MG/ML IJ SOLN
25.0000 mg | Freq: Once | INTRAMUSCULAR | Status: AC
  Administered 2013-05-24: 25 mg via INTRAVENOUS
  Filled 2013-05-24: qty 1

## 2013-05-24 MED ORDER — METOCLOPRAMIDE HCL 5 MG/ML IJ SOLN
10.0000 mg | Freq: Once | INTRAMUSCULAR | Status: AC
  Administered 2013-05-24: 10 mg via INTRAVENOUS
  Filled 2013-05-24: qty 2

## 2013-05-24 MED ORDER — DOXYCYCLINE HYCLATE 100 MG PO CAPS: 100.0000 mg | ORAL_CAPSULE | Freq: Two times a day (BID) | ORAL | Status: DC

## 2013-05-24 MED ORDER — DIPHENHYDRAMINE HCL 25 MG PO TABS: 25.0000 mg | ORAL_TABLET | Freq: Four times a day (QID) | ORAL | Status: DC | PRN

## 2013-05-24 MED ORDER — KETOROLAC TROMETHAMINE 30 MG/ML IJ SOLN
30.0000 mg | Freq: Once | INTRAMUSCULAR | Status: DC
  Filled 2013-05-24: qty 1

## 2013-05-24 MED ORDER — ACETAMINOPHEN 325 MG PO TABS
650.0000 mg | ORAL_TABLET | Freq: Once | ORAL | Status: AC
  Administered 2013-05-24: 650 mg via ORAL
  Filled 2013-05-24: qty 2

## 2013-05-24 NOTE — ED Provider Notes (Signed)
Medical screening examination/treatment/procedure(s) were conducted as a shared visit with non-physician practitioner(s) and myself.  I personally evaluated the patient during the encounter  I interviewed and examined the pt. Lungs CTAB. Abd soft. Diffuse mild pruritic macular papular lesions. Will cover for RMSF given recent tick exposure.   Junius Argyle, MD 05/24/13 2021

## 2013-05-24 NOTE — ED Provider Notes (Signed)
CSN: [id number]     Arrival date & time 05/24/13  0503 History   First MD Initiated Contact with Patient 05/24/13 0559     Chief Complaint  Patient presents with  . Allergic Reaction   (Consider location/radiation/quality/duration/timing/severity/associated sxs/prior Treatment) HPI Comments: Patient presents to the emergency department with chief complaint of headache, nausea, and diarrhea. Additionally, she states that she has had hives since last night. She also endorses arthralgias. She denies any fevers or chills. She states that 2 weeks ago she was but might take and is concerned about tickborne illness. She states that the hives are bothering her the most, and described as very itchy. She has tried taking Benadryl with no relief.  The history is provided by the patient. No language interpreter was used.    Past Medical History  Diagnosis Date  . Diabetes mellitus   . Endometriosis   . Hypertension   . Morbid obesity   . Arthritis   . Anxiety   . Obstructive sleep apnea     CPAP NIGHTLY; sleep study 2007.  Marland Kitchen Renal disorder     kidney stone  . Hypersomnia, persistent     epworth 16    Past Surgical History  Procedure Laterality Date  . Cholecystectomy    . Knee surgery    . Knee arthrocentesis    . Tubal ligation    . Ablation on endometriosis    . Ablation on endometriosis      10 years ago   Family History  Problem Relation Age of Onset  . Diabetes Mother   . Thyroid disease Mother   . Depression Mother     with anxiety  . Stroke Mother   . Diabetes Father   . Hypertension Father   . Diabetes Sister   . Hypertension Sister   . Hypertension Brother    History  Substance Use Topics  . Smoking status: Current Every Day Smoker -- 0.50 packs/day for 15 years    Types: Cigarettes  . Smokeless tobacco: Never Used  . Alcohol Use: No   OB History   Grav Para Term Preterm Abortions TAB SAB Ect Mult Living                 Review of Systems  All other systems  reviewed and are negative.    Allergies  Darvocet; Lisinopril; Morphine and related; Percocet; Wellbutrin; and Zofran  Home Medications   Current Outpatient Rx  Name  Route  Sig  Dispense  Refill  . acetaminophen (TYLENOL) 500 MG tablet   Oral   Take 500 mg by mouth every 6 (six) hours as needed for pain.         Marland Kitchen FLUoxetine (PROZAC) 20 MG capsule   Oral   Take 1 capsule (20 mg total) by mouth daily.   30 capsule   0   . glipiZIDE (GLUCOTROL) 10 MG tablet   Oral   Take 1 tablet (10 mg total) by mouth 2 (two) times daily before a meal.   60 tablet   0   . hydrochlorothiazide (HYDRODIURIL) 25 MG tablet   Oral   Take 1 tablet (25 mg total) by mouth daily.   30 tablet   0   . HYDROcodone-acetaminophen (NORCO/VICODIN) 5-325 MG per tablet   Oral   Take 1 tablet by mouth every 4 (four) hours as needed for pain.   20 tablet   0   . HYDROcodone-acetaminophen (NORCO/VICODIN) 5-325 MG per tablet   Oral  Take 2 tablets by mouth every 4 (four) hours as needed for pain.   15 tablet   0   . meloxicam (MOBIC) 15 MG tablet   Oral   Take 1 tablet (15 mg total) by mouth daily.   30 tablet   0   . metoprolol tartrate (LOPRESSOR) 25 MG tablet   Oral   Take 1 tablet (25 mg total) by mouth 2 (two) times daily.   60 tablet   0   . potassium chloride SA (K-DUR,KLOR-CON) 20 MEQ tablet   Oral   Take 1 tablet (20 mEq total) by mouth daily.   30 tablet   5   . promethazine (PHENERGAN) 25 MG tablet   Oral   Take 1 tablet (25 mg total) by mouth every 6 (six) hours as needed for nausea.   30 tablet   0   . Ranitidine HCl (ZANTAC PO)   Oral   Take 1 tablet by mouth 2 (two) times daily. Patient uses this medication for heartburn.          BP 182/94  Pulse 98  Temp(Src) 98.5 F (36.9 C) (Oral)  Resp 18  Ht 5\' 2"  (1.575 m)  Wt 260 lb (117.935 kg)  BMI 47.54 kg/m2  SpO2 96%  LMP 05/24/2013 Physical Exam  Nursing note and vitals reviewed. Constitutional: She is  oriented to person, place, and time. She appears well-developed and well-nourished.  HENT:  Head: Normocephalic and atraumatic.  Eyes: Conjunctivae and EOM are normal. Pupils are equal, round, and reactive to light.  Neck: Normal range of motion. Neck supple.  Cardiovascular: Normal rate and regular rhythm.  Exam reveals no gallop and no friction rub.   No murmur heard. Pulmonary/Chest: Effort normal and breath sounds normal. No respiratory distress. She has no wheezes. She has no rales. She exhibits no tenderness.  Abdominal: Soft. Bowel sounds are normal. She exhibits no distension and no mass. There is no tenderness. There is no rebound and no guarding.  No tenderness, no fluid wave, or signs of peritonitis  Musculoskeletal: Normal range of motion. She exhibits no edema and no tenderness.  Neurological: She is alert and oriented to person, place, and time.  Skin: Skin is warm and dry. Rash noted.  Scattered maculopapular rash  Psychiatric: She has a normal mood and affect. Her behavior is normal. Judgment and thought content normal.    ED Course  Procedures (including critical care time) Labs Review Labs Reviewed - No data to display Imaging Review No results found.  MDM   1. Rash    Patient with rash, arthralgias, and recent tick bite. Will treat with doxycycline. Will give Benadryl, and Zyrtec.  Suspect that the rash is urticarial, but we'll be cautious and treat.  7:57 AM Patient states the she needs to leave so that her husband can get work and time. She does not want to finish the IV Benadryl and Pepcid.  Roxy Horseman, PA-C 05/24/13 0753  Roxy Horseman, PA-C 05/24/13 [phone]

## 2013-05-24 NOTE — ED Notes (Signed)
Pt c/o breaking out in hives tonight; body aches; nausea; tick bite two wks ago; states bite is red

## 2013-05-25 ENCOUNTER — Ambulatory Visit: Payer: BC Managed Care – PPO | Admitting: Gynecology

## 2013-05-25 LAB — ROCKY MTN SPOTTED FVR AB, IGG-BLOOD: RMSF IgG: 0.09 IV

## 2013-05-25 LAB — ROCKY MTN SPOTTED FVR AB, IGM-BLOOD: RMSF IgM: 0.35 IV (ref 0.00–0.89)

## 2013-05-25 LAB — B. BURGDORFI ANTIBODIES: B burgdorferi Ab IgG+IgM: 0.29 {ISR}

## 2013-06-03 ENCOUNTER — Ambulatory Visit (INDEPENDENT_AMBULATORY_CARE_PROVIDER_SITE_OTHER): Payer: BC Managed Care – PPO | Admitting: Family Medicine

## 2013-06-03 VITALS — BP 132/80 | HR 76 | Temp 98.6°F | Resp 18 | Ht 61.0 in | Wt 265.0 lb

## 2013-06-03 DIAGNOSIS — R05 Cough: Secondary | ICD-10-CM

## 2013-06-03 DIAGNOSIS — IMO0001 Reserved for inherently not codable concepts without codable children: Secondary | ICD-10-CM

## 2013-06-03 DIAGNOSIS — R059 Cough, unspecified: Secondary | ICD-10-CM

## 2013-06-03 DIAGNOSIS — L519 Erythema multiforme, unspecified: Secondary | ICD-10-CM

## 2013-06-03 DIAGNOSIS — R21 Rash and other nonspecific skin eruption: Secondary | ICD-10-CM

## 2013-06-03 LAB — POCT SEDIMENTATION RATE: POCT SED RATE: 16 mm/hr (ref 0–22)

## 2013-06-03 LAB — POCT CBC
Granulocyte percent: 59.8 %G (ref 37–80)
HCT, POC: 45 % (ref 37.7–47.9)
Hemoglobin: 14.6 g/dL (ref 12.2–16.2)
MCV: 95.2 fL (ref 80–97)
Platelet Count, POC: 284 10*3/uL (ref 142–424)
RBC: 4.73 M/uL (ref 4.04–5.48)

## 2013-06-03 MED ORDER — DOXEPIN HCL 25 MG PO CAPS: 150.0000 mg | ORAL_CAPSULE | Freq: Every day | ORAL | Status: DC

## 2013-06-03 MED ORDER — SITAGLIPTIN PHOSPHATE 100 MG PO TABS: 100.0000 mg | ORAL_TABLET | Freq: Every day | ORAL | Status: DC

## 2013-06-03 MED ORDER — DIPHENHYDRAMINE HCL 50 MG/ML IJ SOLN
50.0000 mg | Freq: Once | INTRAMUSCULAR | Status: AC
  Administered 2013-06-03: 50 mg via INTRAMUSCULAR

## 2013-06-03 MED ORDER — HYDROXYZINE HCL 25 MG PO TABS: ORAL_TABLET | ORAL | Status: DC

## 2013-06-03 NOTE — Progress Notes (Signed)
Subjective: Patient is here for a rash that she's had for a couple of weeks. She initially went to the emergency room because she had tick bite a few weeks before that. They treated her with doxycycline in bed lying and RMSF titers which were negative. She has persisted in having the rest are breaking out other places. It itches terribly. She is a diabetic. She does not monitor her sugars. She apparently cannot take metformin, cause left arm pains and weakness. She has been here a couple of times for her diabetes and other problems. It appears that her last visit she was told to make an appointment to see Dr. Katrinka Blazing to followup on the diabetes, and I don't believe that that took place. The itching is driving her crazy. She has continued to work but he is very uncomfortable.  Objective: Obese female. Smokes cigarettes. Her throat was clear without any oral lesions. She has had little respiratory tract infection. She has slight cough. Chest is clear. Heart regular without murmurs gallops or arrhythmias. She has a indurated rash scattered across her body. Her hands are worse, but also her forearms, badly on the upper arms, some on the back, trunk, buttocks, and legs. These are variable size erythematous nodular areas. Some of the places, especially one on her left hand, have a almost vesicular appearance in the central portion of them.throat was clear. Chest clear. Photos were taken of the rash.   Results for orders placed in visit on 06/03/13  POCT CBC      Result Value Range   WBC 8.0  4.6 - 10.2 K/uL   Lymph, poc 2.6  0.6 - 3.4   POC LYMPH PERCENT 32.9  10 - 50 %L   MID (cbc) 0.6  0 - 0.9   POC MID % 7.3  0 - 12 %M   POC Granulocyte 4.8  2 - 6.9   Granulocyte percent 59.8  37 - 80 %G   RBC 4.73  4.04 - 5.48 M/uL   Hemoglobin 14.6  12.2 - 16.2 g/dL   HCT, POC 16.1  09.6 - 47.9 %   MCV 95.2  80 - 97 fL   MCH, POC 30.9  27 - 31.2 pg   MCHC 32.4  31.8 - 35.4 g/dL   RDW, POC 04.5     Platelet  Count, POC 284  142 - 424 K/uL   MPV 7.8  0 - 99.8 fL  GLUCOSE, POCT (MANUAL RESULT ENTRY)      Result Value Range   POC Glucose 283 (*) 70 - 99 mg/dl   Assessment: Probable erythema multi-formi Type 2 diabetes poorly controlled Viral URI and cough  Plan: Cannot use steroids due to the poorly controlled diabetes. And he Januvia. Gave Vistaril and a little doxepin at bedtime increase Zantac to 100 mg twice daily. She just uses OTC Zantac. Return if worse.  Give the Januvia a few weeks to be working on her sugars, as well as her need for excising weight loss, and followup with Dr. Katrinka Blazing in a month or so.

## 2013-06-03 NOTE — Patient Instructions (Addendum)
Work hard on diabetic control, taking special care on diet and exercise.  Begin Januvia 100 mg one daily for diabetes   Take Zantac (ranitidine) 150 mg twice daily  Take doxepin 25 mg one at bedtime for itching  Take hydroxyzine25 mg every 4-6 hours as needed for itching  Stopped the Zyrtec all of these medications  Call your dermatologist acquaintance and try to see him in the next few days. If unable to get an appointment contact us  Make an appointment to see Dr. Nilda Simmer at the [address] office in 1 month

## 2013-06-20 ENCOUNTER — Institutional Professional Consult (permissible substitution): Payer: BC Managed Care – PPO | Admitting: Neurology

## 2013-06-24 ENCOUNTER — Other Ambulatory Visit: Payer: Self-pay | Admitting: Physician Assistant

## 2013-06-24 ENCOUNTER — Ambulatory Visit (INDEPENDENT_AMBULATORY_CARE_PROVIDER_SITE_OTHER): Payer: BC Managed Care – PPO | Admitting: Family Medicine

## 2013-06-24 VITALS — BP 160/88 | HR 66 | Temp 98.4°F | Resp 16 | Ht 61.0 in | Wt 257.0 lb

## 2013-06-24 DIAGNOSIS — IMO0001 Reserved for inherently not codable concepts without codable children: Secondary | ICD-10-CM

## 2013-06-24 DIAGNOSIS — L299 Pruritus, unspecified: Secondary | ICD-10-CM

## 2013-06-24 DIAGNOSIS — E7800 Pure hypercholesterolemia, unspecified: Secondary | ICD-10-CM

## 2013-06-24 DIAGNOSIS — I10 Essential (primary) hypertension: Secondary | ICD-10-CM

## 2013-06-24 DIAGNOSIS — E1165 Type 2 diabetes mellitus with hyperglycemia: Secondary | ICD-10-CM

## 2013-06-24 DIAGNOSIS — R21 Rash and other nonspecific skin eruption: Secondary | ICD-10-CM

## 2013-06-24 LAB — LIPID PANEL
Cholesterol: 288 mg/dL — ABNORMAL HIGH (ref 0–200)
HDL: 36 mg/dL — ABNORMAL LOW (ref >39)
Total CHOL/HDL Ratio: 8 Ratio
Triglycerides: 544 mg/dL — ABNORMAL HIGH (ref <150)

## 2013-06-24 LAB — POCT GLYCOSYLATED HEMOGLOBIN (HGB A1C): Hemoglobin A1C: 11.1

## 2013-06-24 LAB — POCT CBC
Granulocyte percent: 68.5 %G (ref 37–80)
HCT, POC: 47.4 % (ref 37.7–47.9)
Hemoglobin: 15.2 g/dL (ref 12.2–16.2)
Lymph, poc: 2.8 (ref 0.6–3.4)
MCHC: 32.1 g/dL (ref 31.8–35.4)
MPV: 7.5 fL (ref 0–99.8)
POC Granulocyte: 7.6 — AB (ref 2–6.9)
POC LYMPH PERCENT: 25.5 %L (ref 10–50)
POC MID %: 6 %M (ref 0–12)
RBC: 4.94 M/uL (ref 4.04–5.48)

## 2013-06-24 LAB — COMPREHENSIVE METABOLIC PANEL
AST: 26 U/L (ref 0–37)
Albumin: 4 g/dL (ref 3.5–5.2)
Alkaline Phosphatase: 73 U/L (ref 39–117)
BUN: 12 mg/dL (ref 6–23)
Potassium: 4.5 mEq/L (ref 3.5–5.3)

## 2013-06-24 LAB — GLUCOSE, RANDOM

## 2013-06-24 LAB — GLUCOSE, POCT (MANUAL RESULT ENTRY): POC Glucose: 248 mg/dl — AB (ref 70–99)

## 2013-06-24 MED ORDER — DIPHENHYDRAMINE HCL 50 MG/ML IJ SOLN
50.0000 mg | Freq: Once | INTRAMUSCULAR | Status: AC
  Administered 2013-06-24: 50 mg via INTRAMUSCULAR

## 2013-06-24 MED ORDER — METOPROLOL TARTRATE 25 MG PO TABS: 25.0000 mg | ORAL_TABLET | Freq: Two times a day (BID) | ORAL | Status: DC

## 2013-06-24 MED ORDER — GLIPIZIDE 10 MG PO TABS
10.0000 mg | ORAL_TABLET | Freq: Two times a day (BID) | ORAL | Status: DC
Start: 2013-06-24 — End: 2013-07-09

## 2013-06-24 MED ORDER — ROSUVASTATIN CALCIUM 10 MG PO TABS: 10.0000 mg | ORAL_TABLET | Freq: Every day | ORAL | Status: DC

## 2013-06-24 MED ORDER — HYDROCHLOROTHIAZIDE 25 MG PO TABS: 25.0000 mg | ORAL_TABLET | Freq: Every day | ORAL | Status: DC

## 2013-06-24 MED ORDER — GLUCOSE BLOOD VI STRP: ORAL_STRIP | Status: DC

## 2013-06-24 NOTE — Patient Instructions (Addendum)
Victoza - start with 0.6 mg sq each day for 7 days and then increase to 1.2mg  daily. Stop Januvia   Check sugar at 3 times during the day Fasting in the am 2h after eating Before bed

## 2013-06-24 NOTE — Addendum Note (Signed)
Addended by: Morrell Riddle on: 06/24/2013 06:07 PM   Modules accepted: Orders

## 2013-06-24 NOTE — Progress Notes (Signed)
[address] [place] [address]   Phone [phone]  Subjective:    Patient ID: [name], female    DOB: June 20, 1966, 47 y.o.   MRN: [id number]  HPI Pt presents to clinic with several concerns 1- need med refills 2- glucose is running really high - between 350-400s.  She was on Prednisone last week and it was definitely higher while she was on it.  She has been using her Januvia daily but has noticed no improvement in her sugars.   3- rash has not improved.  It has now been going on for 3 weeks and not improving in fact she feels like it is getting worse.  She has been to see Dr Jorja Loa who did a biopsy 5 days ago.  He put her on prednisone but she got no relief from the itching and the rash did not improve and her glucose went really high.  She got no relief from the Doxepin or the Atarax.  The benadryl gives her the most relief but she gets sleepy when she takes it to prevent the itching - it seems to not make sure as sleepy when she is itching really badly.  She does not remember anything different when the rash occurred.  She has animals at home but they are not scratching and she does not think that they have fleas.  No one she is around has a similar rash.  Dr Jorja Loa did lab work on her and told her they were normal. 4- she did a slow taper from her Prozac and she has been doing really well off of it.   Review of Systems  Skin: Positive for rash.       Objective:   Physical Exam  Vitals reviewed. Constitutional: She is oriented to person, place, and time. She appears well-developed and well-nourished.  HENT:  Head: Normocephalic and atraumatic.  Right Ear: External ear normal.  Left Ear: External ear normal.  Eyes: Conjunctivae are normal.  Neck: Normal range of motion.  Cardiovascular: Normal rate, regular rhythm and normal heart sounds.   Pulmonary/Chest: Effort normal and breath sounds normal. No respiratory distress.  Lymphadenopathy:    She has cervical  adenopathy.  Neurological: She is alert and oriented to person, place, and time.  Skin: Skin is warm and dry. Rash noted. No ecchymosis noted. Rash is nodular and urticarial. There is erythema.  Pt scratches the entire visit until about 15 min after her Benadryl dose.  Erythematous nodular rash with overlying urticarial type rash.  Blanches.  Excoriated in places.  Mainly on her arms and mid back near her bra line.     Psychiatric: She has a normal mood and affect. Her behavior is normal. Judgment and thought content normal.   Unna boot drsgs place on bilateral forearms then covered with ACE wraps.     Assessment & Plan:  Diabetes type 2, uncontrolled - Uncontrolled.  Pt is hesitant to start on insulin due to weight gain so we will switch her to Victoza in hopes to improve her DM control.  She administered her 1st dose while she was in the clinic  She will continue to do healthy lifestyle changes for weight loss.  She has lost 8# in 3 weeks.  Spoke with patient that our plan will be to stop the glucotrol because we do not want to over work her pancreas.  She will monitor her glucose at home and bring in a copy when I see her in 2 wks.  Plan: POCT glucose (manual entry), POCT glycosylated hemoglobin (Hb A1C), Comprehensive metabolic panel, Lipid panel, glipiZIDE (GLUCOTROL) 10 MG tablet, glucose blood test strip  HTN (hypertension) - Uncontrolled today but pt is very uncomfortable due to rash and itching.  Will recheck in 2 weeks.  Plan: Comprehensive metabolic panel, hydrochlorothiazide (HYDRODIURIL) 25 MG tablet, metoprolol tartrate (LOPRESSOR) 25 MG tablet  Itching - Plan: Comprehensive metabolic panel, diphenhydrAMINE (BENADRYL) injection 50 mg  Rash and nonspecific skin eruption - Plan: POCT CBC  I am unsure what the rash is but looks like a rash with overlying hives which is definitely being aggravated by her constant scratching.  She should get the biopsy results back hopefully soon for a  definitive diagnosis. She has gotten no relief from her itching with oral medications so will try a barrier with an unna boot material to decrease her itching and see if her itching will improve and allow the rash to heal.  She will RTC in 7-14d for removal of the unna boot.  D/w Dr Alwyn Ren.  Benny Lennert PA-C 06/24/2013 1:50 PM

## 2013-06-24 NOTE — Progress Notes (Signed)
Discussed the patient with Benny Lennert PA, examined the patient with her, and agree with trying the Unna boots on her arms.

## 2013-06-25 ENCOUNTER — Ambulatory Visit: Payer: BC Managed Care – PPO | Admitting: Family Medicine

## 2013-06-27 ENCOUNTER — Encounter: Payer: Self-pay | Admitting: Neurology

## 2013-06-27 ENCOUNTER — Telehealth: Payer: Self-pay

## 2013-06-27 ENCOUNTER — Ambulatory Visit (INDEPENDENT_AMBULATORY_CARE_PROVIDER_SITE_OTHER): Payer: BC Managed Care – PPO | Admitting: Neurology

## 2013-06-27 ENCOUNTER — Other Ambulatory Visit: Payer: Self-pay | Admitting: Physician Assistant

## 2013-06-27 VITALS — BP 138/84 | HR 83 | Temp 99.0°F | Resp 18 | Ht 61.0 in | Wt 258.0 lb

## 2013-06-27 DIAGNOSIS — R351 Nocturia: Secondary | ICD-10-CM | POA: Insufficient documentation

## 2013-06-27 DIAGNOSIS — G4733 Obstructive sleep apnea (adult) (pediatric): Secondary | ICD-10-CM

## 2013-06-27 DIAGNOSIS — E669 Obesity, unspecified: Secondary | ICD-10-CM

## 2013-06-27 DIAGNOSIS — E1165 Type 2 diabetes mellitus with hyperglycemia: Secondary | ICD-10-CM

## 2013-06-27 DIAGNOSIS — G4730 Sleep apnea, unspecified: Secondary | ICD-10-CM | POA: Insufficient documentation

## 2013-06-27 DIAGNOSIS — I10 Essential (primary) hypertension: Secondary | ICD-10-CM

## 2013-06-27 MED ORDER — GLUCOSE BLOOD VI STRP: 1.0000 | ORAL_STRIP | Freq: Two times a day (BID) | Status: DC

## 2013-06-27 NOTE — Telephone Encounter (Signed)
Please add on TSH (ordered). Dx: HTN, obesity

## 2013-06-27 NOTE — Progress Notes (Signed)
[facility]  Provider:  Melvyn Novas, M D  Referring Provider: Allean Found, * Primary Care Physician:  Arletta Bale  Chief Complaint  Patient presents with  . Neurologic Problem    sleep    HPI:  [name] is a 47 y.o. female  Is seen here as a referral/ revisit  from Dr. Katrinka Blazing for follow up after a recent CPAP titration.  [name] return for a CPAP titration after her initial consultation on 02-15-13. The split night polysomnography took place on 03-07-13 the patient had endorsed the Epworth sleepiness score at 23 of 24 possible points.  She endorsed the depression index at 8 points; her BMI was 48 ; her neck circumference measured 19 inches.  There was also an elevated the study blood pressure and altered off 159/86 mm Hg in the left arm. In the morning her blood pressure was 138/80. She did not retain CO2 during the night AHI was documented at 98 per hour of sleep of events in non-REM sleep is milligrams sleep was recorded in the first diagnostic part of the study. There was no sufficient supine sleep recorded to make any additional assumptions about her positional AHI. The lowest oxygen saturation was 76% is 56 minutes between 60 and 90%. The patient was titrated to 9 cm water CPAP which reduced her age I to 2.9. Oxygen nadir improved to 87% was only 12 minutes of desaturations. A nasal mask was used with heated humidity  Unable today to review compliance records the patient has 100% use of the CPAP for the thoughts to 30 days CPAP was issued on 03/19/2013.  Average time in therapy is 7 hours 7 minutes. Residual AHI is 1.3 per hour. Excellent result. The patient's second download is performed here in the office,  dated 06-26-13 , indicated the residual AHI of 1.3, average daily Time in therapy 6 hours 19 minutes.  That pressure is 9 cm 2 cm EPR this is a download from 92 days with 95% compliance.   The patient has actively attempted to lose  weight and has lost weight since our last visit in May, she has been using CPAP for 92 days compliantly and full-field see him as criteria. She noted that nocturia which have been once an hour before the CPAP re\re titration has now been reduced to about hold off at 4 at night. She correlates this to the lateral course level ventricles Oreo rather than the apnea. She will hold to address both acute losing weight further. Again in the last visit the patient had a BMI of 48, 278 pounds and now 258 pounds.    Review of Systems: Out of a complete 14 system review, the patient complains of only the following symptoms, and all other reviewed systems are negative.   Obesity, SOB, Nocturia, some snoring.  Epworth : Now 9 points form 22 points in May 2014!   FSS 36 points,  Depression score 3 points.   History   Social History  . Marital Status: Married    Spouse Name: Loraine Leriche    Number of Children: 1  . Years of Education: 12   Occupational History  . general Administrator, [employer]   Social History Main Topics  . Smoking status: Current Every Day Smoker -- 0.20 packs/day for 15 years    Types: Cigarettes  . Smokeless tobacco: Never Used  . Alcohol Use: No  . Drug Use: No  . Sexual Activity: Yes  Other Topics Concern  . Not on file   Social History Narrative   Marital status: married x 25 years.      Children:  One son (82), no grandchildren, 6 dogs      Lives: with husband, son.      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        Tobacco: 1/2 ppd x 15 years.      Alcohol: socially; special ocassions.      Drugs: none      Exercise: none   Caffeine Use: 2 cups daily    Family History  Problem Relation Age of Onset  . Diabetes Mother   . Thyroid disease Mother   . Depression Mother     with anxiety  . Stroke Mother   . Diabetes Father   . Hypertension Father   . Diabetes Sister   . Hypertension Sister   . Hypertension Brother     Past Medical History  Diagnosis Date  .  Diabetes mellitus   . Endometriosis   . Hypertension   . Morbid obesity   . Arthritis   . Anxiety   . Obstructive sleep apnea     CPAP NIGHTLY; sleep study 2007.  Marland Kitchen Renal disorder     kidney stone  . Hypersomnia, persistent     epworth 16   . Obesity, Class III, BMI 40-49.9 (morbid obesity)     Past Surgical History  Procedure Laterality Date  . Cholecystectomy    . Knee surgery    . Knee arthrocentesis    . Tubal ligation    . Ablation on endometriosis    . Ablation on endometriosis      10 years ago    Current Outpatient Prescriptions  Medication Sig Dispense Refill  . diphenhydrAMINE (BENADRYL) 25 MG tablet Take 1 tablet (25 mg total) by mouth every 6 (six) hours as needed for itching (Rash).  30 tablet  0  . glipiZIDE (GLUCOTROL) 10 MG tablet Take 1 tablet (10 mg total) by mouth 2 (two) times daily before a meal.  60 tablet  0  . glucose blood test strip Use as instructed  100 each  12  . hydrochlorothiazide (HYDRODIURIL) 25 MG tablet Take 1 tablet (25 mg total) by mouth daily.  30 tablet  0  . Liraglutide (VICTOZA) 18 MG/3ML SOPN Inject 1.2 mg into the skin daily.      . metoprolol tartrate (LOPRESSOR) 25 MG tablet Take 1 tablet (25 mg total) by mouth 2 (two) times daily.  60 tablet  0  . rosuvastatin (CRESTOR) 10 MG tablet Take 1 tablet (10 mg total) by mouth daily.  30 tablet  1  . [DISCONTINUED] Ranitidine HCl (ZANTAC PO) Take 1 tablet by mouth 2 (two) times daily. Patient uses this medication for heartburn.       No current facility-administered medications for this visit.    Allergies as of 06/27/2013 - Review Complete 06/27/2013  Allergen Reaction Noted  . Darvocet [propoxyphene-acetaminophen] Hives and Itching 05/29/2011  . Lisinopril Cough 07/30/2012  . Metformin and related Other (See Comments) 06/24/2013  . Morphine and related Nausea And Vomiting 05/29/2011  . Percocet [oxycodone-acetaminophen] Itching 12/29/2011  . Wellbutrin [bupropion hcl]  05/29/2011   . Zofran [ondansetron hcl] Itching 02/03/2013    Vitals: BP 138/84  Pulse 83  Temp(Src) 99 F (37.2 C) (Oral)  Resp 18  Ht 5\' 1"  (1.549 m)  Wt 258 lb (117.028 kg)  BMI 48.77 kg/m2  LMP 06/17/2013 Last  Weight:  Wt Readings from Last 1 Encounters:  06/27/13 258 lb (117.028 kg)   Last Height:   Ht Readings from Last 1 Encounters:  06/27/13 5\' 1"  (1.549 m)   General: The patient is awake, alert and appears not in acute distress. The patient is well groomed.  Head: Normocephalic, atraumatic. Neck is supple. Mallampati3 neck circumference:20 inches, no nasal deviation, mild retrognathia.  Cardiovascular: Regular rate and rhythm at 16 /min. , without murmurs or carotid bruit, and without distended neck veins.  Respiratory: Lungs are clear to auscultation.  Skin: Without evidence of edema, or rash  Trunk: BMI is 50 plus *morbidly obese.  Neurologic exam :  The patient is awake and alert, oriented to place and time. Memory subjective described as intact. There is a normal attention span & concentration ability. Speech is fluent without dysarthria, dysphonia or aphasia. Mood and affect are appropriate.  Cranial nerves:  Pupils are equal and briskly reactive to light.  Extraocular movements in vertical and horizontal planes intact and without nystagmus. Visual fields by finger perimetry are intact.  20-30 and 20-30- with glasses.  Hearing to finger rub intact. Facial sensation intact to fine touch. Facial motor strength is symmetric and tongue and uvula move midline.  Motor exam: Normal tone and normal muscle bulk and symmetric normal strength in all extremities. Good grip strength.  Sensory: Fine touch, pinprick and vibration were tested in all extremities. Proprioception is tested in the upper extremities only. This was normal.  Coordination:Finger-to-nose maneuvernormal without evidence of ataxia, dysmetria or tremor.  Gait and station: Patient walks without assistive device . Strength  within normal limits.      Assessment:  After physical and neurologic examination, review of sleep laboratory studies, testing and pre-existing records, assessment is that of : 1)a patient with severe OSA, responsible for hypersomnia  and now corrected on CPAP 9 cm, 2) morbidly obese without hypoventilation,  BMI is main risk factor,  3)she still snores some ( retrognathia, avoids supine position.)  Plan:  Treatment plan and additional workup : 1) Continue CPAP at 9 cm water, 2 cm EPR nasal mask. 95% compliance and very good resolution of hypersomnia .  Reduction in nocturia, but residual nocturia due to glucosuria.  2)RV in 6 month to correlate pressure download and BMI. Continue low carb diet and exercise.  3)" Migraine "- residual not AM headaches. She has retro-orbital pain and neck pain. I suspect tension headaches. She will use hot and cold packs , Naproxen and phenergan as needed.

## 2013-06-27 NOTE — Telephone Encounter (Signed)
Pt is calling to see if her Thyroid was checked when she was in her on Sunday to see [name] Call back number is  [phone]

## 2013-06-27 NOTE — Telephone Encounter (Signed)
If she is referring to labs, no we did not check labs on her thyroid. She states Dr Richardean Chimera wants this checked. She wants to know if we can add on labs for this, from the labs we drew on Sunday, please advise.

## 2013-06-27 NOTE — Telephone Encounter (Signed)
Added TSH

## 2013-06-29 ENCOUNTER — Encounter: Payer: Self-pay | Admitting: Neurology

## 2013-07-09 ENCOUNTER — Ambulatory Visit (INDEPENDENT_AMBULATORY_CARE_PROVIDER_SITE_OTHER): Payer: BC Managed Care – PPO | Admitting: Physician Assistant

## 2013-07-09 VITALS — BP 132/88 | HR 100 | Temp 99.2°F | Resp 18 | Ht 62.0 in | Wt 256.0 lb

## 2013-07-09 DIAGNOSIS — E1165 Type 2 diabetes mellitus with hyperglycemia: Secondary | ICD-10-CM

## 2013-07-09 DIAGNOSIS — E7800 Pure hypercholesterolemia, unspecified: Secondary | ICD-10-CM

## 2013-07-09 DIAGNOSIS — IMO0001 Reserved for inherently not codable concepts without codable children: Secondary | ICD-10-CM

## 2013-07-09 DIAGNOSIS — I10 Essential (primary) hypertension: Secondary | ICD-10-CM

## 2013-07-09 DIAGNOSIS — R51 Headache: Secondary | ICD-10-CM

## 2013-07-09 MED ORDER — GLUCOSE BLOOD VI STRP: 1.0000 | ORAL_STRIP | Freq: Two times a day (BID) | Status: DC

## 2013-07-09 MED ORDER — METOPROLOL TARTRATE 25 MG PO TABS: 25.0000 mg | ORAL_TABLET | Freq: Two times a day (BID) | ORAL | Status: DC

## 2013-07-09 MED ORDER — KETOROLAC TROMETHAMINE 60 MG/2ML IM SOLN
60.0000 mg | Freq: Once | INTRAMUSCULAR | Status: AC
  Administered 2013-07-09: 60 mg via INTRAMUSCULAR

## 2013-07-09 MED ORDER — ROSUVASTATIN CALCIUM 10 MG PO TABS: 10.0000 mg | ORAL_TABLET | Freq: Every day | ORAL | Status: DC

## 2013-07-09 MED ORDER — HYDROCHLOROTHIAZIDE 25 MG PO TABS: 25.0000 mg | ORAL_TABLET | Freq: Every day | ORAL | Status: DC

## 2013-07-09 MED ORDER — GLIPIZIDE 10 MG PO TABS: 10.0000 mg | ORAL_TABLET | Freq: Two times a day (BID) | ORAL | Status: DC

## 2013-07-09 MED ORDER — CYCLOBENZAPRINE HCL 5 MG PO TABS: 5.0000 mg | ORAL_TABLET | Freq: Three times a day (TID) | ORAL | Status: DC | PRN

## 2013-07-09 MED ORDER — LIRAGLUTIDE 18 MG/3ML ~~LOC~~ SOPN: 1.8000 mg | PEN_INJECTOR | Freq: Every day | SUBCUTANEOUS | Status: DC

## 2013-07-09 NOTE — Progress Notes (Signed)
[address] [place] [address]   Phone [phone]  Subjective:    Patient ID: [name], female    DOB: [birth year], 47 y.o.   MRN: [id number]  HPI Pt presents to clinic for a recheck. 1- Her rash is completely gone - the unna boot wraps on her arms allowed the rash to heal.  She noticed that the rash on her back and legs went away when she got her Glucotrol from a different pharmacy.  She is no longer itching.  The pathology from the biopsy showed an allergic reaction. 2- Her sugars have been running high - fasting have been 230-260s.  Today she has eaten nothing and it was 195.  Over the last 3 weeks she has completely changed her diet and her feet no longer hurt so she feels like she can start exercising.  She has been tolerating the Victoza and would like to increase her dose to 1.8mg .  She is really hesitant to go on insulin due to the SE of weight gain and her current morbid obesity.  She is also very aware of the concerns with uncontrolled diabetes and really wants to get it controlled soon - this is a new mentality for her over the last month she now realizes that her diabetes is causing a lot of problems.   3- she has h/o migraines and she woke up this am with a migraine - it is a typical migraine for her - she has had increase in family stressors over the last 3 days and she is having muscle spasms in her neck.  She has not resorted to eating with her stress as she would have 3 weeks ago. 4- she will also need refills of her meds depending on when her f/u will be  Review of Systems  Gastrointestinal: Positive for nausea (from HA).  Neurological: Positive for headaches.       Objective:   Physical Exam  Vitals reviewed. Constitutional: She is oriented to person, place, and time. She appears well-developed and well-nourished.  HENT:  Head: Normocephalic and atraumatic.  Right Ear: External ear normal.  Left Ear: External ear normal.  Eyes: Conjunctivae and EOM are  normal. Pupils are equal, round, and reactive to light.  Neck: Normal range of motion.  Cardiovascular: Normal rate, regular rhythm and normal heart sounds.   No murmur heard. Pulmonary/Chest: Effort normal and breath sounds normal.  Musculoskeletal:       Cervical back: She exhibits spasm (trapezius muscle spasm - TTP). She exhibits no bony tenderness.  Neurological: She is alert and oriented to person, place, and time.  Reflex Scores:      Bicep reflexes are 2+ on the right side and 2+ on the left side.      Brachioradialis reflexes are 2+ on the right side and 2+ on the left side.      Patellar reflexes are 2+ on the right side and 2+ on the left side.      Achilles reflexes are 2+ on the right side and 2+ on the left side. Skin: Skin is warm and dry.  Psychiatric: She has a normal mood and affect. Her behavior is normal. Judgment and thought content normal.      Assessment & Plan:  Diabetes type 2, uncontrolled - We will increase her Victoza to 1.8mg  daily.  We discussed risks of continuing Glucotrol and wearing out her pancrease sooner - but she is unable to tolerate Metformin.  Pt will continue her lifestyle  changes over the next 2.5 months and if we are not seeing an improvement in her A1C we will consider endo referral or insulin start.  Plan: glipiZIDE (GLUCOTROL) 10 MG tablet, Liraglutide (VICTOZA) 18 MG/3ML SOPN, glucose blood test strip  HTN (hypertension) - well controlled today even with her HA and she has not taken her medication today.  Plan: hydrochlorothiazide (HYDRODIURIL) 25 MG tablet, metoprolol tartrate (LOPRESSOR) 25 MG tablet  Pure hypercholesterolemia - Continue medication and we will recheck at her next visit.  Plan: rosuvastatin (CRESTOR) 10 MG tablet  Headache(784.0) - This is a typical migraine for the patient and she has had good result with Toradol in the past.  She has phenergan at home and she will use Flexeril for her muscle spasms.  Congratulated the past on  not eating with her stress.  Plan: ketorolac (TORADOL) injection 60 mg, cyclobenzaprine (FLEXERIL) 5 MG tablet  Benny Lennert PA-C 07/09/2013 8:45 PM

## 2013-07-10 NOTE — Progress Notes (Signed)
Appointment set up for 12/10 at 2pm.

## 2013-09-03 ENCOUNTER — Telehealth: Payer: Self-pay

## 2013-09-03 NOTE — Telephone Encounter (Signed)
[name]    Patient is requesting to go back on prozac.  [facility] on [address]  [phone]

## 2013-09-05 ENCOUNTER — Ambulatory Visit: Payer: BC Managed Care – PPO | Admitting: Physician Assistant

## 2013-09-05 MED ORDER — FLUOXETINE HCL 20 MG PO TABS: 20.0000 mg | ORAL_TABLET | Freq: Every day | ORAL | Status: DC

## 2013-09-05 NOTE — Telephone Encounter (Signed)
I have sent her in a 2 month supply and we can recheck when she sees me mid Jan for her DM check

## 2013-10-05 ENCOUNTER — Telehealth: Payer: Self-pay

## 2013-10-05 NOTE — Telephone Encounter (Signed)
Patient would like to see if Windell Hummingbird can give her some samples of Victoza. She says she has done that for her in the past. [facility] # [phone]

## 2013-10-05 NOTE — Telephone Encounter (Signed)
We do not have samples here.

## 2013-10-07 NOTE — Telephone Encounter (Signed)
Spoke with pt advised we don't have any samples. She wants to know if you can call in her Prozac. She is dealing with a lot right now, her brother in law passed away and her husband has a septic staph infection. She just needs something to help her through this time. She will come in to see you for her [facility] and other bloodwork but she will not be able to do that until next month. Please advise.

## 2013-10-08 MED ORDER — FLUOXETINE HCL 20 MG PO TABS: 20.0000 mg | ORAL_TABLET | Freq: Every day | ORAL | Status: DC

## 2013-10-08 NOTE — Telephone Encounter (Signed)
Pt advised.

## 2013-10-08 NOTE — Telephone Encounter (Signed)
Sent Prozac into the pharmacy for the patient.

## 2013-11-13 ENCOUNTER — Other Ambulatory Visit: Payer: Self-pay | Admitting: Physician Assistant

## 2013-12-07 ENCOUNTER — Other Ambulatory Visit: Payer: Self-pay

## 2013-12-07 DIAGNOSIS — IMO0002 Reserved for concepts with insufficient information to code with codable children: Secondary | ICD-10-CM

## 2013-12-07 DIAGNOSIS — E1165 Type 2 diabetes mellitus with hyperglycemia: Secondary | ICD-10-CM

## 2013-12-07 MED ORDER — LIRAGLUTIDE 18 MG/3ML ~~LOC~~ SOPN: 1.8000 mg | PEN_INJECTOR | Freq: Every day | SUBCUTANEOUS | Status: DC

## 2013-12-07 MED ORDER — FLUOXETINE HCL 20 MG PO TABS: 20.0000 mg | ORAL_TABLET | Freq: Every day | ORAL | Status: DC

## 2013-12-07 MED ORDER — "PEN NEEDLES 5/16"" 31G X 8 MM MISC": Status: DC

## 2013-12-26 ENCOUNTER — Ambulatory Visit: Payer: BC Managed Care – PPO | Admitting: Nurse Practitioner

## 2014-01-17 ENCOUNTER — Ambulatory Visit (INDEPENDENT_AMBULATORY_CARE_PROVIDER_SITE_OTHER): Payer: BC Managed Care – PPO | Admitting: Physician Assistant

## 2014-01-17 VITALS — BP 166/92 | HR 108 | Temp 98.5°F | Resp 18 | Ht 60.75 in | Wt 236.0 lb

## 2014-01-17 DIAGNOSIS — N809 Endometriosis, unspecified: Secondary | ICD-10-CM | POA: Insufficient documentation

## 2014-01-17 DIAGNOSIS — R51 Headache: Secondary | ICD-10-CM

## 2014-01-17 DIAGNOSIS — E7800 Pure hypercholesterolemia, unspecified: Secondary | ICD-10-CM

## 2014-01-17 DIAGNOSIS — E119 Type 2 diabetes mellitus without complications: Secondary | ICD-10-CM

## 2014-01-17 DIAGNOSIS — L258 Unspecified contact dermatitis due to other agents: Secondary | ICD-10-CM

## 2014-01-17 DIAGNOSIS — L853 Xerosis cutis: Secondary | ICD-10-CM

## 2014-01-17 DIAGNOSIS — I10 Essential (primary) hypertension: Secondary | ICD-10-CM

## 2014-01-17 LAB — POCT GLYCOSYLATED HEMOGLOBIN (HGB A1C): Hemoglobin A1C: 6.7

## 2014-01-17 MED ORDER — HYDROCODONE-ACETAMINOPHEN 5-325 MG PO TABS: 1.0000 | ORAL_TABLET | Freq: Three times a day (TID) | ORAL | Status: DC | PRN

## 2014-01-17 MED ORDER — METOPROLOL TARTRATE 25 MG PO TABS: 25.0000 mg | ORAL_TABLET | Freq: Two times a day (BID) | ORAL | Status: DC

## 2014-01-17 MED ORDER — TRIAMCINOLONE ACETONIDE 0.025 % EX CREA: 1.0000 "application " | TOPICAL_CREAM | Freq: Two times a day (BID) | CUTANEOUS | Status: DC

## 2014-01-17 MED ORDER — GLIPIZIDE 10 MG PO TABS: ORAL_TABLET | ORAL | Status: DC

## 2014-01-17 MED ORDER — HYDROCHLOROTHIAZIDE 25 MG PO TABS: 25.0000 mg | ORAL_TABLET | Freq: Every day | ORAL | Status: DC

## 2014-01-17 MED ORDER — GLUCOSE BLOOD VI STRP: 1.0000 | ORAL_STRIP | Freq: Two times a day (BID) | Status: DC

## 2014-01-17 MED ORDER — ROSUVASTATIN CALCIUM 10 MG PO TABS: 10.0000 mg | ORAL_TABLET | Freq: Every day | ORAL | Status: DC

## 2014-01-17 MED ORDER — LIRAGLUTIDE 18 MG/3ML ~~LOC~~ SOPN: 1.8000 mg | PEN_INJECTOR | Freq: Every day | SUBCUTANEOUS | Status: DC

## 2014-01-17 MED ORDER — CYCLOBENZAPRINE HCL 5 MG PO TABS: 5.0000 mg | ORAL_TABLET | Freq: Three times a day (TID) | ORAL | Status: DC | PRN

## 2014-01-17 NOTE — Progress Notes (Signed)
   Subjective:    Patient ID: [name], female    DOB: January 24, 1966, 48 y.o.   MRN: [id number]  HPI  Pt presents to clinic for a recheck 1- here for a recheck on her DM.  She has been doing really good with her diet and has lost 20# in the last 6 months - she has not started exercising but plans on it soon -- 2- she has been doing well since they moved out from her sister-in-law's house - work is stressful due to change in boss and short people 3- her endometriosis is acting up - her menses are better now that she has lost weight but she still has pretty significant back pain with her menses. 4- needs BP meds refilled - tolerates meds well - takes it at night  Review of Systems     Objective:   Physical Exam  Vitals reviewed. Constitutional: She is oriented to person, place, and time. She appears well-developed and well-nourished.  HENT:  Head: Normocephalic and atraumatic.  Right Ear: Hearing, tympanic membrane, external ear and ear canal normal.  Left Ear: Hearing, tympanic membrane, external ear and ear canal normal.  Nose: Nose normal.  Mouth/Throat: Uvula is midline, oropharynx is clear and moist and mucous membranes are normal.  Eyes: Conjunctivae are normal.  Dry eyes on bilateral eyelids  Neck: Normal range of motion.  Cardiovascular: Normal rate, regular rhythm and normal heart sounds.   No murmur heard. Pulmonary/Chest: Effort normal and breath sounds normal.  Musculoskeletal: Normal range of motion.  Neurological: She is alert and oriented to person, place, and time. She has normal reflexes.  Normal DM foot exam  Skin: Skin is warm and dry.  Psychiatric: She has a normal mood and affect. Her behavior is normal. Judgment and thought content normal.   Results for orders placed in visit on 01/17/14  POCT GLYCOSYLATED HEMOGLOBIN (HGB A1C)      Result Value Ref Range   Hemoglobin A1C 6.7         Assessment & Plan:  DM (diabetes mellitus), type 2 - Plan: Lipid  panel, COMPLETE METABOLIC PANEL WITH GFR, POCT glycosylated hemoglobin (Hb A1C), Liraglutide (VICTOZA) 18 MG/3ML SOPN, glucose blood test strip, glipiZIDE (GLUCOTROL) 10 MG tablet due to impressive change in A!C will decrease to qd and then if glucose readings are stable will discontinue due to not wanting to overwork the pancreas, Microalbumin, urine, HM DIABETES FOOT EXAM  Endometriosis - Plan: HYDROcodone-acetaminophen (NORCO/VICODIN) 5-325 MG per tablet for prn use  HTN (hypertension) - Plan: COMPLETE METABOLIC PANEL WITH GFR, metoprolol tartrate (LOPRESSOR) 25 MG tablet, hydrochlorothiazide (HYDRODIURIL) 25 MG tablet  Pure hypercholesterolemia - Plan: rosuvastatin (CRESTOR) 10 MG tablet  Headache(784.0) - Plan: cyclobenzaprine (FLEXERIL) 5 MG tablet  Dry skin dermatitis - Plan: triamcinolone (KENALOG) 0.025 % cream  Windell Hummingbird PA-C  [facility] 01/17/2014 8:27 PM

## 2014-01-18 LAB — COMPLETE METABOLIC PANEL WITH GFR
ALK PHOS: 105 U/L (ref 39–117)
ALT: 13 U/L (ref 0–35)
AST: 11 U/L (ref 0–37)
Albumin: 4.1 g/dL (ref 3.5–5.2)
BILIRUBIN TOTAL: 0.3 mg/dL (ref 0.2–1.2)
BUN: 16 mg/dL (ref 6–23)
CO2: 26 meq/L (ref 19–32)
CREATININE: 0.84 mg/dL (ref 0.50–1.10)
Calcium: 9.4 mg/dL (ref 8.4–10.5)
Chloride: 104 mEq/L (ref 96–112)
GFR, Est Non African American: 83 mL/min
GLUCOSE: 128 mg/dL — AB (ref 70–99)
Potassium: 4.1 mEq/L (ref 3.5–5.3)
Sodium: 141 mEq/L (ref 135–145)
Total Protein: 7.1 g/dL (ref 6.0–8.3)

## 2014-01-18 LAB — LIPID PANEL
CHOL/HDL RATIO: 7.2 ratio
Cholesterol: 237 mg/dL — ABNORMAL HIGH (ref 0–200)
HDL: 33 mg/dL — ABNORMAL LOW (ref >39)
LDL Cholesterol: 129 mg/dL — ABNORMAL HIGH (ref 0–99)
TRIGLYCERIDES: 376 mg/dL — AB (ref <150)
VLDL: 75 mg/dL — ABNORMAL HIGH (ref 0–40)

## 2014-01-18 LAB — MICROALBUMIN, URINE: Microalb, Ur: 5.48 mg/dL — ABNORMAL HIGH (ref 0.00–1.89)

## 2014-01-21 ENCOUNTER — Other Ambulatory Visit: Payer: Self-pay | Admitting: Physician Assistant

## 2014-01-21 DIAGNOSIS — E7800 Pure hypercholesterolemia, unspecified: Secondary | ICD-10-CM

## 2014-01-21 MED ORDER — ROSUVASTATIN CALCIUM 20 MG PO TABS: 10.0000 mg | ORAL_TABLET | Freq: Every day | ORAL | Status: DC

## 2014-01-30 NOTE — Progress Notes (Signed)
Please advise, when do you want her to follow up? Note CC to scheduling pool, but no timeframe given

## 2014-01-31 ENCOUNTER — Telehealth: Payer: Self-pay | Admitting: Family Medicine

## 2014-01-31 NOTE — Telephone Encounter (Signed)
Pt stated that she is the only one at work and will more than likely follow up at the walk in clinic.

## 2014-01-31 NOTE — Telephone Encounter (Signed)
Message copied by Chinita Pester on Thu Jan 31, 2014  1:23 PM ------      Message from: Mancel Bale      Created: Thu Jan 31, 2014 11:29 AM       3 month f/u apt with me please .            Sarah ------

## 2014-02-18 ENCOUNTER — Telehealth: Payer: Self-pay | Admitting: Physician Assistant

## 2014-02-18 DIAGNOSIS — N809 Endometriosis, unspecified: Secondary | ICD-10-CM

## 2014-02-18 NOTE — Telephone Encounter (Signed)
Refill on Hydrocodone  [phone]

## 2014-02-19 MED ORDER — HYDROCODONE-ACETAMINOPHEN 5-325 MG PO TABS: 1.0000 | ORAL_TABLET | Freq: Three times a day (TID) | ORAL | Status: DC | PRN

## 2014-02-19 NOTE — Telephone Encounter (Signed)
Notified pt on vm  rx ready 

## 2014-02-19 NOTE — Telephone Encounter (Signed)
ready

## 2014-03-25 ENCOUNTER — Telehealth: Payer: Self-pay

## 2014-03-25 DIAGNOSIS — N809 Endometriosis, unspecified: Secondary | ICD-10-CM

## 2014-03-25 MED ORDER — HYDROCODONE-ACETAMINOPHEN 5-325 MG PO TABS: 1.0000 | ORAL_TABLET | Freq: Three times a day (TID) | ORAL | Status: DC | PRN

## 2014-03-25 NOTE — Telephone Encounter (Signed)
Pt states she needs her vicodin refilled   [facility] phone [phone]

## 2014-03-25 NOTE — Telephone Encounter (Signed)
Rx printed.  Meds ordered this encounter  Medications  . HYDROcodone-acetaminophen (NORCO/VICODIN) 5-325 MG per tablet    Sig: Take 1 tablet by mouth every 8 (eight) hours as needed for moderate pain.    Dispense:  20 tablet    Refill:  0    Order Specific Question:  Supervising Provider    Answer:  DOOLITTLE, ROBERT P [[id number]]

## 2014-03-26 NOTE — Telephone Encounter (Signed)
Patient called back to check who called her about her prescripion; told her it was ready for pick up. She says she will come by this afternoon and bring id.

## 2014-03-28 NOTE — Telephone Encounter (Signed)
Patient calling back to see if it is ready for pick up please call when ready thank you

## 2014-03-28 NOTE — Telephone Encounter (Signed)
Spoke to pt.  Per previous note, she is aware rx ready for p/u.

## 2014-05-11 ENCOUNTER — Other Ambulatory Visit: Payer: Self-pay | Admitting: Physician Assistant

## 2014-05-26 ENCOUNTER — Ambulatory Visit (INDEPENDENT_AMBULATORY_CARE_PROVIDER_SITE_OTHER): Payer: BC Managed Care – PPO | Admitting: Physician Assistant

## 2014-05-26 VITALS — BP 136/80 | HR 78 | Temp 98.4°F | Resp 16 | Ht 61.0 in | Wt 241.2 lb

## 2014-05-26 DIAGNOSIS — I10 Essential (primary) hypertension: Secondary | ICD-10-CM

## 2014-05-26 DIAGNOSIS — F410 Panic disorder [episodic paroxysmal anxiety] without agoraphobia: Secondary | ICD-10-CM

## 2014-05-26 DIAGNOSIS — E119 Type 2 diabetes mellitus without complications: Secondary | ICD-10-CM

## 2014-05-26 DIAGNOSIS — J0110 Acute frontal sinusitis, unspecified: Secondary | ICD-10-CM

## 2014-05-26 DIAGNOSIS — R51 Headache: Secondary | ICD-10-CM

## 2014-05-26 DIAGNOSIS — M62830 Muscle spasm of back: Secondary | ICD-10-CM

## 2014-05-26 DIAGNOSIS — N809 Endometriosis, unspecified: Secondary | ICD-10-CM

## 2014-05-26 DIAGNOSIS — M5380 Other specified dorsopathies, site unspecified: Secondary | ICD-10-CM

## 2014-05-26 DIAGNOSIS — S39012D Strain of muscle, fascia and tendon of lower back, subsequent encounter: Secondary | ICD-10-CM

## 2014-05-26 DIAGNOSIS — Z5189 Encounter for other specified aftercare: Secondary | ICD-10-CM

## 2014-05-26 DIAGNOSIS — J0111 Acute recurrent frontal sinusitis: Secondary | ICD-10-CM

## 2014-05-26 LAB — POCT GLYCOSYLATED HEMOGLOBIN (HGB A1C): Hemoglobin A1C: 6.9

## 2014-05-26 MED ORDER — HYDROCHLOROTHIAZIDE 25 MG PO TABS: 25.0000 mg | ORAL_TABLET | Freq: Every day | ORAL | Status: DC

## 2014-05-26 MED ORDER — AMOXICILLIN 875 MG PO TABS: 875.0000 mg | ORAL_TABLET | Freq: Two times a day (BID) | ORAL | Status: DC

## 2014-05-26 MED ORDER — KETOROLAC TROMETHAMINE 60 MG/2ML IM SOLN
60.0000 mg | Freq: Once | INTRAMUSCULAR | Status: AC
  Administered 2014-05-26: 60 mg via INTRAMUSCULAR

## 2014-05-26 MED ORDER — HYDROCODONE-ACETAMINOPHEN 5-325 MG PO TABS: 1.0000 | ORAL_TABLET | Freq: Three times a day (TID) | ORAL | Status: DC | PRN

## 2014-05-26 MED ORDER — LIRAGLUTIDE 18 MG/3ML ~~LOC~~ SOPN: 1.8000 mg | PEN_INJECTOR | Freq: Every day | SUBCUTANEOUS | Status: DC

## 2014-05-26 MED ORDER — DIAZEPAM 2 MG PO TABS: 2.0000 mg | ORAL_TABLET | Freq: Two times a day (BID) | ORAL | Status: DC | PRN

## 2014-05-26 MED ORDER — CYCLOBENZAPRINE HCL ER 15 MG PO CP24: 15.0000 mg | ORAL_CAPSULE | Freq: Every day | ORAL | Status: DC | PRN

## 2014-05-26 MED ORDER — IBUPROFEN 800 MG PO TABS: 800.0000 mg | ORAL_TABLET | Freq: Three times a day (TID) | ORAL | Status: DC | PRN

## 2014-05-26 NOTE — Progress Notes (Signed)
Subjective:    Patient ID: [name], female    DOB: [birth year], 48 y.o.   MRN: [id number]  HPI  Pt presents to clinic for several problems.  1- thinks she might have a sinus infection because she has increased pressure in her sinuses for the past 3 weeks and now headaches and facial pain.  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  She is stiffer than normal.  She thinks that some of her neck pain is causing her headache.  She takes Flexeril but she is unable to take it during the day because it makes her to sleepy.  3- it is time to recheck her DM.  Her sugars at home are running 130s and she feels good.  She is down to Glucotrol once daily and the Victoza.  4- She is having some panic attacks - she has had them in the past but has not had them for a very long time.  She feels them coming on and they will last for an hour at least.  She can calm herself down a lot of times but then sometimes it just continues to get worse.  She will try and go to sleep but sometimes that does not even help.  She has been on Valium in the past and that helps. She typically has less than 2 a week that she cannot get to stop with just thinking clearing and breathing evenly and resting.  She is under increased stress with her husbands upcoming surgery and him being out of work and a new job for her.   Review of Systems  Constitutional: Negative for fever and chills.  HENT: Positive for sinus pressure. Negative for sore throat.   Respiratory: Negative for cough.   Musculoskeletal: Positive for back pain.  Neurological: Positive for headaches. Negative for dizziness.       Objective:   Physical Exam  Vitals reviewed. Constitutional: She is oriented to person, place, and time. She appears well-developed and well-nourished.  HENT:  Head: Normocephalic and atraumatic.  Right Ear: External ear normal.  Left Ear: External  ear normal.  Cardiovascular: Normal rate, regular rhythm and normal heart sounds.   No murmur heard. Pulmonary/Chest: Effort normal and breath sounds normal. She has no wheezes.  Musculoskeletal:  Normal foot exam.  Neurological: She is alert and oriented to person, place, and time.  Skin: Skin is warm and dry.  Psychiatric: She has a normal mood and affect. Her behavior is normal. Judgment and thought content normal.   Results for orders placed in visit on 05/26/14  POCT GLYCOSYLATED HEMOGLOBIN (HGB A1C)      Result Value Ref Range   Hemoglobin A1C 6.9         Assessment & Plan:  Essential hypertension - Plan: hydrochlorothiazide (HYDRODIURIL) 25 MG tablet  Endometriosis - Plan: HYDROcodone-acetaminophen (NORCO/VICODIN) 5-325 MG per tablet  Type 2 diabetes mellitus without complication - Good control - we are going to try and get her off glipizide.  Plan: POCT glycosylated hemoglobin (Hb A1C), Liraglutide (VICTOZA) 18 MG/3ML SOPN, HM DIABETES FOOT EXAM  Muscle spasm of back - Because the patient cannot tolerate regular use of Flexeril due to drowsy - we will try this to see if she gets more control.  Plan: cyclobenzaprine (AMRIX) 15 MG 24 hr capsule  Headache(784.0) - Plan: ketorolac (TORADOL) injection 60 mg  Lumbar strain, subsequent encounter - Plan:  ibuprofen (ADVIL,MOTRIN) 800 MG tablet  Acute recurrent frontal sinusitis - She will also use nasal saline and steam to help with congestion.  Plan: amoxicillin (AMOXIL) 875 MG tablet  Panic attacks - We will start with rescue medications because they are not frequent and if it continues we may need to start daily medications. Plan: diazepam (VALIUM) 2 MG tablet   She will f/u in 3 months. Windell Hummingbird PA-C  [facility] 05/26/2014 3:26 PM

## 2014-07-08 ENCOUNTER — Encounter: Payer: Self-pay | Admitting: Neurology

## 2014-07-08 ENCOUNTER — Ambulatory Visit (INDEPENDENT_AMBULATORY_CARE_PROVIDER_SITE_OTHER): Payer: BC Managed Care – PPO | Admitting: Neurology

## 2014-07-08 ENCOUNTER — Encounter (INDEPENDENT_AMBULATORY_CARE_PROVIDER_SITE_OTHER): Payer: Self-pay

## 2014-07-08 VITALS — BP 135/81 | HR 85 | Resp 14 | Ht 61.0 in | Wt 247.0 lb

## 2014-07-08 DIAGNOSIS — Z9989 Dependence on other enabling machines and devices: Secondary | ICD-10-CM

## 2014-07-08 DIAGNOSIS — G4733 Obstructive sleep apnea (adult) (pediatric): Secondary | ICD-10-CM

## 2014-07-08 DIAGNOSIS — R519 Headache, unspecified: Secondary | ICD-10-CM

## 2014-07-08 DIAGNOSIS — R51 Headache: Secondary | ICD-10-CM

## 2014-07-08 NOTE — Progress Notes (Addendum)
[facility]  Provider:  Larey Seat, M D  Referring Provider: No ref. provider found Primary Care Physician:  [facility], PA-C  Chief Complaint  Patient presents with  . Sleep Apnea    f/u, needs compliance, Rm     HPI:  [name] is a 48 y.o. female  Is seen here as a referral/ revisit  from Dr. Everlene Farrier / PA Weber for follow up after a recent CPAP titration.  [name] return for a CPAP titration after her  split night polysomnography, which  took place on 03-07-13. AHI was 98/hr.   The patient had endorsed the Epworth sleepiness score at 23 of 24 possible points. She endorsed the depression index at 8 points; her BMI was 48 ; her neck circumference measured 19 inches.  There was also an elevated the study blood pressure and altered off 159/86 mm Hg in the left arm. In the morning her blood pressure was 138/80. She did not retain CO2 during the night AHI was documented at 98 per hour of sleep of events in non-REM sleep is milligrams sleep was recorded in the first diagnostic part of the study. There was no sufficient supine sleep recorded to make any additional assumptions about her positional AHI. The lowest oxygen saturation was 76% is 56 minutes between 60 and 90%. The patient was titrated to 9 cm water CPAP which reduced her age I to 2.9. Oxygen nadir improved to 87% was only 12 minutes of desaturations. A nasal mask was used with heated humidity Unable today to review compliance records the patient has 100% use of the CPAP for the thoughts to 30 days CPAP was issued on 03/19/2013. Average time in therapy is 7 hours 7 minutes. Residual AHI is 1.3 per hour. Excellent result. The patient's second download is performed here in the office,  dated 06-26-13 , indicated the residual AHI of 1.3, average daily Time in therapy 6 hours 19 minutes.  That pressure is 9 cm 2 cm EPR this is a download from 92 days with 95% compliance.  The patient has actively attempted to  lose weight and has lost weight since our last visit in May, she has been using CPAP for 92 days compliantly - She noted that nocturia which have been once an hour before the CPAP re\re titration has now been reduced to about hold off at 4 at night. She correlates this to the lateral course level ventricles Oreo rather than the apnea. She will hold to address both acute losing weight further. Again in the last visit the patient had a BMI of 48, 278 Bowen and now 258 Bowen.   07-08-14,  Interval history ; Mrs. [name] today for one-year follow up. The patient has continued to use her CPAP and brought up today for compliance visit. Her husband has been very sick over the last 12 months and she has certainly been under a lot of stress. She was able to cut her cigarette he was startled 4-5 cigarettes a day but is not yet quite ready to completely quit. She also was able to reduce her BMI and her current weight is 247 Bowen down from 278 Bowen last year. Her husband suffers for a cancer of the liver and had hip and knee surgery with complications.   She goes to bed at 10 PM is asleep by 10.30 ,wakes up at 6.15 , 7 days a week. Sleeps through the night, has 1-3 nocturias at night - puts CPAP back on  and goes back to sleep. She takes lasix in AM now.  She reported an Ep worth score of  4 from 23/ 24 points before CPAP  , FSS at   Compliance report: The patient uses CPAP at is that pressure of 9 cm water with 2 cm EPI, residual AHI is 1.3 she is using CPAP 30 of 30 days with 100% compliance for over 4 hours  Daily use over  90 days . The average daily time of use of 7 hours and 50 minutes.       Review of Systems: Out of a complete 14 system review, the patient complains of only the following symptoms, and all other reviewed systems are negative.   Obesity, SOB, Nocturia, some snoring.  Epworth : Now 4 points   FSS 16 points, from 36 . Depression score 2 points.   Lyme disease in May 2015  .   History   Social History  . Marital Status: Married    Spouse Name: [name]    Number of Children: 1  . Years of Education: 12   Occupational History  . general Glass blower/designer   Social History Main Topics  . Smoking status: Current Every Day Smoker -- 0.20 packs/day for 15 years    Types: Cigarettes  . Smokeless tobacco: Never Used  . Alcohol Use: No  . Drug Use: No  . Sexual Activity: Yes   Other Topics Concern  . Not on file   Social History Narrative   Marital status: married x 25 years.      Children:  One son (30), no grandchildren, 79 dogs      Lives: with husband, son.      Employment: in [employer].        Tobacco: 1/2 ppd x 15 years.      Alcohol: socially; special ocassions.      Drugs: none      Exercise: none   Caffeine Use: 2 cups daily    Family History  Problem Relation Age of Onset  . Diabetes Mother   . Thyroid disease Mother   . Depression Mother     with anxiety  . Stroke Mother   . Diabetes Father   . Hypertension Father   . Diabetes Sister   . Hypertension Sister   . Hypertension Brother     Past Medical History  Diagnosis Date  . Diabetes mellitus   . Endometriosis   . Hypertension   . Morbid obesity   . Arthritis   . Anxiety   . Obstructive sleep apnea     CPAP NIGHTLY; sleep study 2007.  Marland Kitchen Renal disorder     kidney stone  . Hypersomnia, persistent     epworth 16   . Obesity, Class III, BMI 40-49.9 (morbid obesity)     Past Surgical History  Procedure Laterality Date  . Cholecystectomy    . Knee surgery    . Knee arthrocentesis    . Tubal ligation    . Ablation on endometriosis    . Ablation on endometriosis      10 years ago    Current Outpatient Prescriptions  Medication Sig Dispense Refill  . amoxicillin (AMOXIL) 875 MG tablet Take 1 tablet (875 mg total) by mouth 2 (two) times daily.  20 tablet  0  . cyclobenzaprine (AMRIX) 15 MG 24 hr capsule Take 1 capsule (15 mg total) by mouth daily as  needed for muscle spasms.  [place]  capsule  0  . cyclobenzaprine (FLEXERIL) 5 MG tablet Take 1 tablet (5 mg total) by mouth 3 (three) times daily as needed for muscle spasms.  30 tablet  1  . diazepam (VALIUM) 2 MG tablet Take 1 tablet (2 mg total) by mouth every 12 (twelve) hours as needed for anxiety.  30 tablet  0  . glipiZIDE (GLUCOTROL) 10 MG tablet Take 1 tablet (10 mg total) by mouth 2 (two) times daily before a meal. PATIENT NEEDS OFFICE VISIT FOR ADDITIONAL REFILLS  60 tablet  0  . glucose blood test strip 1 each by Other route 2 (two) times daily. Use as instructed  100 each  12  . hydrochlorothiazide (HYDRODIURIL) 25 MG tablet Take 1 tablet (25 mg total) by mouth daily.  30 tablet  2  . HYDROcodone-acetaminophen (NORCO/VICODIN) 5-325 MG per tablet Take 1 tablet by mouth every 8 (eight) hours as needed for moderate pain.  20 tablet  0  . ibuprofen (ADVIL,MOTRIN) 800 MG tablet Take 1 tablet (800 mg total) by mouth every 8 (eight) hours as needed.  90 tablet  0  . Insulin Pen Needle (PEN NEEDLES 31GX5/16") 31G X 8 MM MISC Inject insulin daily. PATIENT NEEDS OFFICE VISIT FOR ADDITIONAL REFILLS  100 each  0  . Liraglutide (VICTOZA) 18 MG/3ML SOPN Inject 1.8 mg into the skin daily.  3 pen  2  . metoprolol tartrate (LOPRESSOR) 25 MG tablet Take 1 tablet (25 mg total) by mouth 2 (two) times daily.  60 tablet  2  . rosuvastatin (CRESTOR) 20 MG tablet Take 0.5 tablets (10 mg total) by mouth daily.  30 tablet  2  . [DISCONTINUED] Ranitidine HCl (ZANTAC PO) Take 1 tablet by mouth 2 (two) times daily. Patient uses this medication for heartburn.       No current facility-administered medications for this visit.    Allergies as of 07/08/2014 - Review Complete 07/08/2014  Allergen Reaction Noted  . Darvocet [propoxyphene n-acetaminophen] Hives and Itching 05/29/2011  . Lisinopril Cough 07/30/2012  . Metformin and related Other (See Comments) 06/24/2013  . Morphine and related Nausea And Vomiting  05/29/2011  . Percocet [oxycodone-acetaminophen] Itching 12/29/2011  . Wellbutrin [bupropion hcl]  05/29/2011  . Zofran [ondansetron hcl] Itching 02/03/2013    Vitals: BP 135/81  Pulse 85  Resp 14  Ht 5\' 1"  (1.549 m)  Wt 247 lb (112.038 kg)  BMI 46.69 kg/m2  LMP 07/01/2014 Last Weight:  Wt Readings from Last 1 Encounters:  07/08/14 247 lb (112.038 kg)   Last Height:   Ht Readings from Last 1 Encounters:  07/08/14 5\' 1"  (1.549 m)   General: The patient is awake, alert and appears not in acute distress. The patient is well groomed.  Head: Normocephalic, atraumatic. Neck is supple. Mallampati 4, red, irtitated upper airway.  neck circumference: 20 inches,  no nasal deviation, mild retrognathia.  Cardiovascular: Regular rate and rhythm at 16 /min., without murmurs or carotid bruit, and without distended neck veins.  Respiratory: Lungs are clear to auscultation.  Skin: Without evidence of edema, or rash  Trunk: BMI is 47 , morbidly obese.  Neurologic exam :  The patient is awake and alert, oriented to place and time. Memory subjective described as intact. There is a normal attention span & concentration ability.  Speech is fluent without dysarthria, dysphonia or aphasia. Mood and affect are appropriate.  Cranial nerves:  Pupils are equal and briskly reactive to light.  Extraocular movements in vertical and horizontal planes  intact and without nystagmus. Visual fields by finger perimetry are intact.  20-30 and 20-30- with glasses.  Hearing to finger rub intact. Facial sensation intact to fine touch. Facial motor strength is symmetric and tongue and uvula move midline.  Motor exam: Normal tone and normal muscle bulk and symmetric normal strength in all extremities. Good grip strength.  Sensory: Fine touch, pinprick and vibration were tested in all extremities.  Proprioception is tested in the upper extremities only. This was normal.  Coordination:Finger-to-nose maneuver normal without  evidence of ataxia, dysmetria or tremor.  Gait and station: Patient walks without assistive device. Strength within normal limits.    Assessment:  After physical and neurologic examination, review of sleep laboratory studies, testing and pre-existing records, assessment is that of :  1) a this patient with severe OSA, responsible for hypersomnia and now corrected on CPAP 9 cm, is d doing much better-  she is not longer having HTN and is not reporting any nightmares - which she had before CPAP. One nocturia on average.   2) morbidly obese without hypoventilation,  BMI is main risk factor,  3)she still snores some ( retrognathia, avoids supine position.)  Plan:   Treatment plan and additional workup : 1) Continue CPAP at 9 cm water, 2 cm EPR nasal mask. 100% compliance and very good resolution of hypersomnia .  Reduction in nocturia, but residual nocturia due to glucosuria.  2) obesity and smoking cessation information given  BMI with further weight loss may need CPAP adjustments -. Continue low carb diet and exercise.  3)" Migraine "- residual not AM headaches.  She has retro-orbital pain , but less frequent, often peri menstrual.  I suspected a tension headaches component.  She will use hot and cold packs , Naproxen and phenergan as needed.  RV in 12 month with me or NP. CD    Gustave Lindeman, MD

## 2014-07-08 NOTE — Patient Instructions (Signed)
You Can Quit Smoking If you are ready to quit smoking or are thinking about it, congratulations! You have chosen to help yourself be healthier and live longer! There are lots of different ways to quit smoking. Nicotine gum, nicotine patches, a nicotine inhaler, or nicotine nasal spray can help with physical craving. Hypnosis, support groups, and medicines help break the habit of smoking. TIPS TO GET OFF AND STAY OFF CIGARETTES  Learn to predict your moods. Do not let a bad situation be your excuse to have a cigarette. Some situations in your life might tempt you to have a cigarette.  Ask friends and co-workers not to smoke around you.  Make your home smoke-free.  Never have "just one" cigarette. It leads to wanting another and another. Remind yourself of your decision to quit.  On a card, make a list of your reasons for not smoking. Read it at least the same number of times a day as you have a cigarette. Tell yourself everyday, "I do not want to smoke. I choose not to smoke."  Ask someone at home or work to help you with your plan to quit smoking.  Have something planned after you eat or have a cup of coffee. Take a walk or get other exercise to perk you up. This will help to keep you from overeating.  Try a relaxation exercise to calm you down and decrease your stress. Remember, you may be tense and nervous the first two weeks after you quit. This will pass.  Find new activities to keep your hands busy. Play with a pen, coin, or rubber band. Doodle or draw things on paper.  Brush your teeth right after eating. This will help cut down the craving for the taste of tobacco after meals. You can try mouthwash too.  Try gum, breath mints, or diet candy to keep something in your mouth. IF YOU SMOKE AND WANT TO QUIT:  Do not stock up on cigarettes. Never buy a carton. Wait until one pack is finished before you buy another.  Never carry cigarettes with you at work or at home.  Keep cigarettes  as far away from you as possible. Leave them with someone else.  Never carry matches or a lighter with you.  Ask yourself, "Do I need this cigarette or is this just a reflex?"  Bet with someone that you can quit. Put cigarette money in a piggy bank every morning. If you smoke, you give up the money. If you do not smoke, by the end of the week, you keep the money.  Keep trying. It takes 21 days to change a habit!  Talk to your doctor about using medicines to help you quit. These include nicotine replacement gum, lozenges, or skin patches. Document Released: 07/10/2009 Document Revised: 12/06/2011 Document Reviewed: 07/10/2009 [facility] Patient Information 2015 [place], [place]. This information is not intended to replace advice given to you by your health care provider. Make sure you discuss any questions you have with your health care provider. Obesity Obesity is having too much body fat and a body mass index (BMI) of 30 or more. BMI is a number based on your height and weight. The number is an estimate of how much body fat you have. Obesity can happen if you eat more calories than you can burn by exercising or other activity. It can cause major health problems or emergencies.  HOME CARE  Exercise and be active as told by your doctor. Try:  Using stairs when you  can.  Parking farther away from store doors.  Gardening, biking, or walking.  Eat healthy foods and drinks that are low in calories. Eat more fruits and vegetables.  Limit fast food, sweets, and snack foods that are made with ingredients that are not natural (processed food).  Eat smaller amounts of food.  Keep a journal and write down what you eat every day. Websites can help with this.  Avoid drinking alcohol. Drink more water and drinks without calories.   Take vitamins and dietary pills (supplements) only as told by your doctor.  Try going to weight-loss support groups or classes to help lessen stress. Dietitians and  counselors may also help. GET HELP RIGHT AWAY IF:  You have chest pain or tightness.  You have trouble breathing or feel short of breath.  You feel weak or have loss of feeling (numbness) in your legs.  You feel confused or have trouble talking.  You have sudden changes in your vision. MAKE SURE YOU:  Understand these instructions.  Will watch your condition.  Will get help right away if you are not doing well or get worse. Document Released: 12/06/2011 Document Revised: 01/28/2014 Document Reviewed: 12/06/2011 [facility] Patient Information 2015 [place], [place]. This information is not intended to replace advice given to you by your health care provider. Make sure you discuss any questions you have with your health care provider.

## 2014-07-15 ENCOUNTER — Other Ambulatory Visit: Payer: Self-pay | Admitting: Physician Assistant

## 2014-07-15 ENCOUNTER — Telehealth: Payer: Self-pay

## 2014-07-15 DIAGNOSIS — N809 Endometriosis, unspecified: Secondary | ICD-10-CM

## 2014-07-15 DIAGNOSIS — G4700 Insomnia, unspecified: Secondary | ICD-10-CM

## 2014-07-15 NOTE — Telephone Encounter (Signed)
Pt requesting refills on Vicodin,Valium   Best phone [phone]

## 2014-07-16 MED ORDER — HYDROCODONE-ACETAMINOPHEN 5-325 MG PO TABS: 1.0000 | ORAL_TABLET | Freq: Three times a day (TID) | ORAL | Status: DC | PRN

## 2014-07-16 MED ORDER — DIAZEPAM 2 MG PO TABS: 2.0000 mg | ORAL_TABLET | Freq: Two times a day (BID) | ORAL | Status: DC | PRN

## 2014-07-16 NOTE — Telephone Encounter (Signed)
Pt notified that rx is ready for p/u

## 2014-07-16 NOTE — Telephone Encounter (Signed)
Ready

## 2014-08-15 ENCOUNTER — Other Ambulatory Visit: Payer: Self-pay | Admitting: Physician Assistant

## 2014-08-15 NOTE — Telephone Encounter (Signed)
[name], you Aug OV notes say you are going to try to get pt off of glipizide. Do you want to deny this?

## 2014-08-26 ENCOUNTER — Telehealth: Payer: Self-pay

## 2014-08-26 DIAGNOSIS — N809 Endometriosis, unspecified: Secondary | ICD-10-CM

## 2014-08-26 NOTE — Telephone Encounter (Signed)
Pt would like to know if Judson Roch would write her NORCO/VICODIN 5-325 MGS. And her IBUPROFEN 800MG S. Please call [phone]

## 2014-08-27 MED ORDER — IBUPROFEN 800 MG PO TABS: 800.0000 mg | ORAL_TABLET | Freq: Three times a day (TID) | ORAL | Status: DC | PRN

## 2014-08-27 MED ORDER — HYDROCODONE-ACETAMINOPHEN 5-325 MG PO TABS: 1.0000 | ORAL_TABLET | Freq: Three times a day (TID) | ORAL | Status: DC | PRN

## 2014-08-27 NOTE — Telephone Encounter (Signed)
Pt notified. Rx in pick up drawer. 

## 2014-08-27 NOTE — Telephone Encounter (Signed)
Ready for pick up

## 2014-10-02 ENCOUNTER — Other Ambulatory Visit: Payer: Self-pay | Admitting: Physician Assistant

## 2014-10-02 DIAGNOSIS — N809 Endometriosis, unspecified: Secondary | ICD-10-CM

## 2014-10-02 DIAGNOSIS — G4700 Insomnia, unspecified: Secondary | ICD-10-CM

## 2014-10-02 NOTE — Telephone Encounter (Signed)
Refill on VALIUM 2 MG tablet and NORCO/VICODIN 5-325 MG per tablet.  [phone]

## 2014-10-02 NOTE — Telephone Encounter (Signed)
Pended both.

## 2014-10-03 MED ORDER — DIAZEPAM 2 MG PO TABS: 2.0000 mg | ORAL_TABLET | Freq: Two times a day (BID) | ORAL | Status: DC | PRN

## 2014-10-03 MED ORDER — HYDROCODONE-ACETAMINOPHEN 5-325 MG PO TABS: 1.0000 | ORAL_TABLET | Freq: Three times a day (TID) | ORAL | Status: DC | PRN

## 2014-10-03 NOTE — Telephone Encounter (Signed)
Done

## 2014-10-04 NOTE — Telephone Encounter (Signed)
Rx in drawer. Pt notified.

## 2014-10-16 ENCOUNTER — Other Ambulatory Visit: Payer: Self-pay | Admitting: Physician Assistant

## 2014-11-21 ENCOUNTER — Other Ambulatory Visit: Payer: Self-pay | Admitting: Physician Assistant

## 2014-12-08 ENCOUNTER — Ambulatory Visit (INDEPENDENT_AMBULATORY_CARE_PROVIDER_SITE_OTHER): Payer: BLUE CROSS/BLUE SHIELD | Admitting: Physician Assistant

## 2014-12-08 VITALS — BP 122/82 | HR 75 | Temp 98.0°F | Resp 14 | Ht 61.5 in | Wt 248.0 lb

## 2014-12-08 DIAGNOSIS — I10 Essential (primary) hypertension: Secondary | ICD-10-CM | POA: Diagnosis not present

## 2014-12-08 DIAGNOSIS — N809 Endometriosis, unspecified: Secondary | ICD-10-CM | POA: Diagnosis not present

## 2014-12-08 DIAGNOSIS — R600 Localized edema: Secondary | ICD-10-CM | POA: Diagnosis not present

## 2014-12-08 DIAGNOSIS — E7800 Pure hypercholesterolemia, unspecified: Secondary | ICD-10-CM

## 2014-12-08 DIAGNOSIS — S39012A Strain of muscle, fascia and tendon of lower back, initial encounter: Secondary | ICD-10-CM | POA: Diagnosis not present

## 2014-12-08 DIAGNOSIS — E119 Type 2 diabetes mellitus without complications: Secondary | ICD-10-CM | POA: Diagnosis not present

## 2014-12-08 DIAGNOSIS — R3915 Urgency of urination: Secondary | ICD-10-CM | POA: Diagnosis not present

## 2014-12-08 LAB — POCT GLYCOSYLATED HEMOGLOBIN (HGB A1C): Hemoglobin A1C: 7.6

## 2014-12-08 LAB — COMPLETE METABOLIC PANEL WITH GFR
ALK PHOS: 69 U/L (ref 39–117)
ALT: 14 U/L (ref 0–35)
AST: 14 U/L (ref 0–37)
Albumin: 3.8 g/dL (ref 3.5–5.2)
BUN: 17 mg/dL (ref 6–23)
CO2: 22 mEq/L (ref 19–32)
Calcium: 9.3 mg/dL (ref 8.4–10.5)
Chloride: 105 mEq/L (ref 96–112)
Creat: 0.72 mg/dL (ref 0.50–1.10)
GFR, Est Non African American: >89 mL/min
GLUCOSE: 121 mg/dL — AB (ref 70–99)
Potassium: 4.2 mEq/L (ref 3.5–5.3)
SODIUM: 138 meq/L (ref 135–145)
Total Bilirubin: 0.5 mg/dL (ref 0.2–1.2)
Total Protein: 6.5 g/dL (ref 6.0–8.3)

## 2014-12-08 LAB — POCT CBC
Granulocyte percent: 62.3 %G (ref 37–80)
HCT, POC: 41.2 % (ref 37.7–47.9)
Hemoglobin: 13.5 g/dL (ref 12.2–16.2)
Lymph, poc: 4 — AB (ref 0.6–3.4)
MCH, POC: 29.7 pg (ref 27–31.2)
MCHC: 32.8 g/dL (ref 31.8–35.4)
MCV: 90.6 fL (ref 80–97)
MID (cbc): 0.4 (ref 0–0.9)
MPV: 6.7 fL (ref 0–99.8)
PLATELET COUNT, POC: 311 10*3/uL (ref 142–424)
POC Granulocyte: 7.3 — AB (ref 2–6.9)
POC LYMPH PERCENT: 34.6 %L (ref 10–50)
POC MID %: 3.1 %M (ref 0–12)
RBC: 4.55 M/uL (ref 4.04–5.48)
RDW, POC: 14.5 %
WBC: 11.7 10*3/uL — AB (ref 4.6–10.2)

## 2014-12-08 LAB — LIPID PANEL
CHOL/HDL RATIO: 7.4 ratio
Cholesterol: 215 mg/dL — ABNORMAL HIGH (ref 0–200)
HDL: 29 mg/dL — AB (ref >=46)
LDL CALC: 117 mg/dL — AB (ref 0–99)
Triglycerides: 346 mg/dL — ABNORMAL HIGH (ref <150)
VLDL: 69 mg/dL — AB (ref 0–40)

## 2014-12-08 LAB — POCT URINALYSIS DIPSTICK
Blood, UA: NEGATIVE
Glucose, UA: NEGATIVE
Ketones, UA: 15
Leukocytes, UA: NEGATIVE
NITRITE UA: NEGATIVE
Protein, UA: NEGATIVE
SPEC GRAV UA: 1.025
Urobilinogen, UA: 0.2
pH, UA: 5.5

## 2014-12-08 LAB — POCT UA - MICROSCOPIC ONLY
CASTS, UR, LPF, POC: NEGATIVE
Crystals, Ur, HPF, POC: NEGATIVE
Mucus, UA: NEGATIVE
Yeast, UA: NEGATIVE

## 2014-12-08 LAB — GLUCOSE, POCT (MANUAL RESULT ENTRY): POC Glucose: 122 mg/dl — AB (ref 70–99)

## 2014-12-08 MED ORDER — ATORVASTATIN CALCIUM 80 MG PO TABS: 80.0000 mg | ORAL_TABLET | Freq: Every day | ORAL | Status: DC

## 2014-12-08 MED ORDER — CYCLOBENZAPRINE HCL 5 MG PO TABS: 5.0000 mg | ORAL_TABLET | Freq: Three times a day (TID) | ORAL | Status: DC | PRN

## 2014-12-08 MED ORDER — LIRAGLUTIDE 18 MG/3ML ~~LOC~~ SOPN: 1.8000 mg | PEN_INJECTOR | Freq: Every day | SUBCUTANEOUS | Status: DC

## 2014-12-08 MED ORDER — IBUPROFEN 800 MG PO TABS: 800.0000 mg | ORAL_TABLET | Freq: Three times a day (TID) | ORAL | Status: DC | PRN

## 2014-12-08 MED ORDER — GLUCOSE BLOOD VI STRP: 1.0000 | ORAL_STRIP | Freq: Two times a day (BID) | Status: DC

## 2014-12-08 MED ORDER — METOPROLOL TARTRATE 25 MG PO TABS: 25.0000 mg | ORAL_TABLET | Freq: Two times a day (BID) | ORAL | Status: DC

## 2014-12-08 MED ORDER — FUROSEMIDE 20 MG PO TABS: 10.0000 mg | ORAL_TABLET | Freq: Every day | ORAL | Status: DC

## 2014-12-08 MED ORDER — HYDROCODONE-ACETAMINOPHEN 5-325 MG PO TABS: 1.0000 | ORAL_TABLET | Freq: Three times a day (TID) | ORAL | Status: DC | PRN

## 2014-12-08 MED ORDER — GLIPIZIDE 10 MG PO TABS: 10.0000 mg | ORAL_TABLET | Freq: Two times a day (BID) | ORAL | Status: DC

## 2014-12-08 MED ORDER — CELECOXIB 200 MG PO CAPS: 200.0000 mg | ORAL_CAPSULE | Freq: Every day | ORAL | Status: DC

## 2014-12-08 NOTE — Progress Notes (Signed)
Subjective:    Patient ID: [name], female    DOB: 1966/09/25, 49 y.o.   MRN: [id number]  HPI Pt presents to clinic for a recheck of her HTN, DM and elevated cholesterol.  She is also concerned about her back pain and she is having some urinary urgency and she wants to make sure that she does not have a UTI/kidney infection.  1- her sugars have been a little higher than normal - high 100s.  She has been taking her medication like rx. 2- her HTN is well controlled - no CP or SOB 3- taking cholesterol medication but she wants to try something different due to the cost - she was on one that made her legs ache but she is not sure which one it was 4- she is not eating well - but she is trying to walk daily 5- her right lower back is hurting and it seems to be worse when she is laying on it but she has not had an injury that she knows of.  Celebrex has helped with the pain but she is scared to take it daily.  She is able to take the Amrix on the wknds and that helps but she gets less relief from the Flexeril and she is only able to take that one work days.  She does get some pain at time into her buttocks and then down the back of her leg but no paresthesias or weakness or pain to the toe.   Review of Systems  Constitutional: Negative for fever and chills.  HENT: Negative.   Gastrointestinal: Positive for nausea and diarrhea. Negative for vomiting and constipation.  Genitourinary: Positive for urgency and frequency. Negative for dysuria.  Musculoskeletal: Positive for back pain (right side). Negative for gait problem.       Objective:   Physical Exam  Constitutional: She is oriented to person, place, and time. She appears well-developed and well-nourished.  BP 122/82 mmHg  Pulse 75  Temp(Src) 98 F (36.7 C) (Oral)  Resp 14  Ht 5' 1.5" (1.562 m)  Wt 248 lb (112.492 kg)  BMI 46.11 kg/m2  SpO2 97%   HENT:  Head: Normocephalic and atraumatic.  Right Ear: External ear normal.    Left Ear: External ear normal.  Eyes: Conjunctivae are normal.  Cardiovascular: Normal rate, regular rhythm and normal heart sounds.   No murmur heard. Pulmonary/Chest: Effort normal and breath sounds normal. She has no wheezes.  Abdominal: Soft. There is no tenderness. There is no CVA tenderness.  Musculoskeletal:       Lumbar back: She exhibits tenderness and spasm. She exhibits no edema.       Back:  No piriformis pain.  Neurological: She is alert and oriented to person, place, and time. She has normal strength. She displays normal reflexes. No sensory deficit.  Reflex Scores:      Patellar reflexes are 2+ on the right side and 2+ on the left side.      Achilles reflexes are 2+ on the right side. Neg SLR  Skin: Skin is warm and dry.  Psychiatric: She has a normal mood and affect. Her behavior is normal. Judgment and thought content normal.   Results for orders placed or performed in visit on 12/08/14  POCT glucose (manual entry)  Result Value Ref Range   POC Glucose 122 (A) 70 - 99 mg/dl  POCT glycosylated hemoglobin (Hb A1C)  Result Value Ref Range   Hemoglobin A1C 7.6   POCT urinalysis  dipstick  Result Value Ref Range   Color, UA yellow    Clarity, UA clear    Glucose, UA neg    Bilirubin, UA small    Ketones, UA 15    Spec Grav, UA 1.025    Blood, UA neg    pH, UA 5.5    Protein, UA neg    Urobilinogen, UA 0.2    Nitrite, UA neg    Leukocytes, UA Negative   POCT UA - Microscopic Only  Result Value Ref Range   WBC, Ur, HPF, POC 0-2    RBC, urine, microscopic 1-3    Bacteria, U Microscopic 1+    Mucus, UA neg    Epithelial cells, urine per micros 1-5    Crystals, Ur, HPF, POC neg    Casts, Ur, LPF, POC neg    Yeast, UA neg   POCT CBC  Result Value Ref Range   WBC 11.7 (A) 4.6 - 10.2 K/uL   Lymph, poc 4.0 (A) 0.6 - 3.4   POC LYMPH PERCENT 34.6 10 - 50 %L   MID (cbc) 0.4 0 - 0.9   POC MID % 3.1 0 - 12 %M   POC Granulocyte 7.3 (A) 2 - 6.9   Granulocyte  percent 62.3 37 - 80 %G   RBC 4.55 4.04 - 5.48 M/uL   Hemoglobin 13.5 12.2 - 16.2 g/dL   HCT, POC 41.2 37.7 - 47.9 %   MCV 90.6 80 - 97 fL   MCH, POC 29.7 27 - 31.2 pg   MCHC 32.8 31.8 - 35.4 g/dL   RDW, POC 14.5 %   Platelet Count, POC 311 142 - 424 K/uL   MPV 6.7 0 - 99.8 fL       Assessment & Plan:  Essential hypertension - Well controlled continue current medications.  Plan: metoprolol tartrate (LOPRESSOR) 25 MG tablet  Type 2 diabetes mellitus without complication - Slight worse control this visit.  She is going to try and do better with her diet.  I think we will monitor over the next 3 months due to her interest in exercise and diet changes but then if no help I think we should consider off glucotrol and invokana to help with control.  Plan: POCT glucose (manual entry), POCT glycosylated hemoglobin (Hb A1C), COMPLETE METABOLIC PANEL WITH GFR, Liraglutide (VICTOZA) 18 MG/3ML SOPN, glucose blood test strip, glipiZIDE (GLUCOTROL) 10 MG tablet  Endometriosis -Continue pain.  Her menses are regular and not terribly heavy. Plan: HYDROcodone-acetaminophen (NORCO/VICODIN) 5-325 MG per tablet  Elevated cholesterol - Check labs - continue treatment but we will change her statin - she may need a medication for her triglycerides.  Plan: Lipid panel, atorvastatin (LIPITOR) 80 MG tablet  Urinary urgency - Normal urine her CBC is slightly elevated but the same as it was about 1.5 years ago - this may be her normal- Plan: POCT urinalysis dipstick, POCT UA - Microscopic Only, POCT CBC  Lumbar strain, initial encounter - Continue NSAIDs and stretches - again we talked about water exercise - Plan: cyclobenzaprine (FLEXERIL) 5 MG tablet, celecoxib (CELEBREX) 200 MG capsule, ibuprofen (ADVIL,MOTRIN) 800 MG tablet  Bilateral leg edema - she wants to be able to have this for prn usage - the HCTZ does not help as much and she has not been on for weeks and she has well controlled BP at this visit.  Plan:  furosemide (LASIX) 20 MG tablet  Windell Hummingbird PA-C  [facility] 12/08/2014 1:19 PM

## 2014-12-09 ENCOUNTER — Encounter: Payer: Self-pay | Admitting: Physician Assistant

## 2014-12-11 ENCOUNTER — Telehealth: Payer: Self-pay

## 2014-12-11 NOTE — Telephone Encounter (Signed)
Patient says she cannot take Lipitor, she says it is making her joints hurt. Wants to switch back to Crestor

## 2014-12-11 NOTE — Telephone Encounter (Signed)
Patient says she cannot take Lipitor because it is making her joints hurt. Wants to switch back to Crestor

## 2014-12-11 NOTE — Telephone Encounter (Signed)
See msg

## 2014-12-13 MED ORDER — ROSUVASTATIN CALCIUM 40 MG PO TABS: 40.0000 mg | ORAL_TABLET | Freq: Every day | ORAL | Status: DC

## 2014-12-13 NOTE — Telephone Encounter (Signed)
Notified pt on VM that crestor sent to pharm.

## 2014-12-13 NOTE — Telephone Encounter (Signed)
No problem.  I have sent the new medication to the pharmacy for the patient.

## 2015-01-07 ENCOUNTER — Telehealth: Payer: Self-pay

## 2015-01-07 DIAGNOSIS — G4700 Insomnia, unspecified: Secondary | ICD-10-CM

## 2015-01-07 DIAGNOSIS — N809 Endometriosis, unspecified: Secondary | ICD-10-CM

## 2015-01-07 MED ORDER — HYDROCODONE-ACETAMINOPHEN 5-325 MG PO TABS: 1.0000 | ORAL_TABLET | Freq: Three times a day (TID) | ORAL | Status: DC | PRN

## 2015-01-07 MED ORDER — DIAZEPAM 2 MG PO TABS: 2.0000 mg | ORAL_TABLET | Freq: Two times a day (BID) | ORAL | Status: DC | PRN

## 2015-01-07 NOTE — Telephone Encounter (Signed)
Pt would like a refill on her diazepam (VALIUM) 2 MG tablet [[id number]] and HYDROcodone-acetaminophen (NORCO/VICODIN) 5-325 MG per tablet [[id number]]. Please advise at  [phone]

## 2015-01-07 NOTE — Telephone Encounter (Signed)
ready

## 2015-01-08 NOTE — Telephone Encounter (Signed)
Notified pt ready. 

## 2015-01-10 ENCOUNTER — Other Ambulatory Visit: Payer: Self-pay | Admitting: Physician Assistant

## 2015-02-04 ENCOUNTER — Other Ambulatory Visit: Payer: Self-pay

## 2015-02-04 DIAGNOSIS — N809 Endometriosis, unspecified: Secondary | ICD-10-CM

## 2015-02-04 DIAGNOSIS — G4700 Insomnia, unspecified: Secondary | ICD-10-CM

## 2015-02-04 MED ORDER — DIAZEPAM 2 MG PO TABS: 2.0000 mg | ORAL_TABLET | Freq: Two times a day (BID) | ORAL | Status: DC | PRN

## 2015-02-04 NOTE — Telephone Encounter (Signed)
Ready

## 2015-02-04 NOTE — Telephone Encounter (Signed)
REFILL REQUEST   diazepam (VALIUM) 2 MG tablet  And vicodin

## 2015-02-05 MED ORDER — HYDROCODONE-ACETAMINOPHEN 5-325 MG PO TABS: 1.0000 | ORAL_TABLET | Freq: Three times a day (TID) | ORAL | Status: DC | PRN

## 2015-02-05 NOTE — Telephone Encounter (Signed)
Rx in pick up draw. 

## 2015-02-05 NOTE — Telephone Encounter (Signed)
Notified pt Rx ready.

## 2015-02-05 NOTE — Telephone Encounter (Signed)
Vicodin Rx?

## 2015-02-05 NOTE — Addendum Note (Signed)
Addended by: Mancel Bale on: 02/05/2015 10:26 AM   Modules accepted: Orders

## 2015-02-05 NOTE — Telephone Encounter (Signed)
Error. Rx faxed.

## 2015-02-05 NOTE — Telephone Encounter (Signed)
Ready now - sorry I missed that part of the message.

## 2015-03-12 ENCOUNTER — Other Ambulatory Visit: Payer: Self-pay | Admitting: Physician Assistant

## 2015-03-26 ENCOUNTER — Encounter: Payer: Self-pay | Admitting: Physician Assistant

## 2015-03-26 ENCOUNTER — Ambulatory Visit (INDEPENDENT_AMBULATORY_CARE_PROVIDER_SITE_OTHER): Payer: BLUE CROSS/BLUE SHIELD | Admitting: Physician Assistant

## 2015-03-26 VITALS — BP 144/100 | HR 87 | Temp 98.1°F | Resp 16 | Ht 61.0 in | Wt 250.4 lb

## 2015-03-26 DIAGNOSIS — E119 Type 2 diabetes mellitus without complications: Secondary | ICD-10-CM

## 2015-03-26 DIAGNOSIS — N809 Endometriosis, unspecified: Secondary | ICD-10-CM | POA: Diagnosis not present

## 2015-03-26 DIAGNOSIS — R600 Localized edema: Secondary | ICD-10-CM

## 2015-03-26 DIAGNOSIS — G4700 Insomnia, unspecified: Secondary | ICD-10-CM

## 2015-03-26 DIAGNOSIS — F430 Acute stress reaction: Secondary | ICD-10-CM

## 2015-03-26 DIAGNOSIS — E7800 Pure hypercholesterolemia, unspecified: Secondary | ICD-10-CM

## 2015-03-26 DIAGNOSIS — S39012A Strain of muscle, fascia and tendon of lower back, initial encounter: Secondary | ICD-10-CM

## 2015-03-26 DIAGNOSIS — L298 Other pruritus: Secondary | ICD-10-CM | POA: Diagnosis not present

## 2015-03-26 DIAGNOSIS — I10 Essential (primary) hypertension: Secondary | ICD-10-CM

## 2015-03-26 DIAGNOSIS — F329 Major depressive disorder, single episode, unspecified: Secondary | ICD-10-CM | POA: Diagnosis not present

## 2015-03-26 DIAGNOSIS — J302 Other seasonal allergic rhinitis: Secondary | ICD-10-CM

## 2015-03-26 DIAGNOSIS — F32A Depression, unspecified: Secondary | ICD-10-CM

## 2015-03-26 DIAGNOSIS — N898 Other specified noninflammatory disorders of vagina: Secondary | ICD-10-CM

## 2015-03-26 LAB — COMPLETE METABOLIC PANEL WITH GFR
ALT: 19 U/L (ref 0–35)
AST: 17 U/L (ref 0–37)
Albumin: 4 g/dL (ref 3.5–5.2)
Alkaline Phosphatase: 82 U/L (ref 39–117)
BUN: 13 mg/dL (ref 6–23)
CO2: 24 meq/L (ref 19–32)
CREATININE: 0.71 mg/dL (ref 0.50–1.10)
Calcium: 9.8 mg/dL (ref 8.4–10.5)
Chloride: 103 mEq/L (ref 96–112)
GFR, Est Non African American: >89 mL/min
Glucose, Bld: 175 mg/dL — ABNORMAL HIGH (ref 70–99)
Potassium: 3.9 mEq/L (ref 3.5–5.3)
Sodium: 141 mEq/L (ref 135–145)
Total Bilirubin: 0.4 mg/dL (ref 0.2–1.2)
Total Protein: 6.8 g/dL (ref 6.0–8.3)

## 2015-03-26 LAB — LIPID PANEL
CHOL/HDL RATIO: 5.7 ratio
Cholesterol: 181 mg/dL (ref 0–200)
HDL: 32 mg/dL — ABNORMAL LOW (ref >=46)
LDL CALC: 73 mg/dL (ref 0–99)
Triglycerides: 378 mg/dL — ABNORMAL HIGH (ref <150)
VLDL: 76 mg/dL — ABNORMAL HIGH (ref 0–40)

## 2015-03-26 LAB — POCT GLYCOSYLATED HEMOGLOBIN (HGB A1C): Hemoglobin A1C: 7.9

## 2015-03-26 MED ORDER — CELECOXIB 200 MG PO CAPS: 200.0000 mg | ORAL_CAPSULE | Freq: Every day | ORAL | Status: DC

## 2015-03-26 MED ORDER — FLUOXETINE HCL 20 MG PO CAPS: 20.0000 mg | ORAL_CAPSULE | Freq: Every day | ORAL | Status: DC

## 2015-03-26 MED ORDER — GLIPIZIDE 10 MG PO TABS: ORAL_TABLET | ORAL | Status: DC

## 2015-03-26 MED ORDER — CANAGLIFLOZIN 100 MG PO TABS: 100.0000 mg | ORAL_TABLET | Freq: Every day | ORAL | Status: DC

## 2015-03-26 MED ORDER — DIAZEPAM 2 MG PO TABS: 2.0000 mg | ORAL_TABLET | Freq: Two times a day (BID) | ORAL | Status: DC | PRN

## 2015-03-26 MED ORDER — FLUCONAZOLE 150 MG PO TABS: 150.0000 mg | ORAL_TABLET | Freq: Once | ORAL | Status: DC

## 2015-03-26 MED ORDER — ROSUVASTATIN CALCIUM 40 MG PO TABS: 40.0000 mg | ORAL_TABLET | Freq: Every day | ORAL | Status: DC

## 2015-03-26 MED ORDER — HYDROCODONE-ACETAMINOPHEN 5-325 MG PO TABS: 1.0000 | ORAL_TABLET | Freq: Three times a day (TID) | ORAL | Status: DC | PRN

## 2015-03-26 MED ORDER — LIRAGLUTIDE 18 MG/3ML ~~LOC~~ SOPN: PEN_INJECTOR | SUBCUTANEOUS | Status: DC

## 2015-03-26 MED ORDER — INSULIN PEN NEEDLE 32G X 5 MM MISC: 1.0000 "application " | Freq: Every day | Status: DC

## 2015-03-26 MED ORDER — FUROSEMIDE 20 MG PO TABS: 10.0000 mg | ORAL_TABLET | Freq: Every day | ORAL | Status: DC

## 2015-03-26 MED ORDER — MOMETASONE FUROATE 50 MCG/ACT NA SUSP: 2.0000 | Freq: Every day | NASAL | Status: DC

## 2015-03-26 MED ORDER — "PEN NEEDLES 5/16"" 31G X 8 MM MISC": Status: DC

## 2015-03-26 MED ORDER — GLUCOSE BLOOD VI STRP: 1.0000 | ORAL_STRIP | Freq: Two times a day (BID) | Status: DC

## 2015-03-26 MED ORDER — METOPROLOL TARTRATE 25 MG PO TABS: 25.0000 mg | ORAL_TABLET | Freq: Two times a day (BID) | ORAL | Status: DC

## 2015-03-26 NOTE — Progress Notes (Signed)
[name]  MRN: [id number] DOB: 1966-02-26  Subjective:  Pt presents to clinic for recheck of her chronic medical problems and medication refills.  She is doing ok with regards to her medical problems.  She has not been eating great recently due to increased stress.  Her husband recently was diagnosed with liver cancer and 7/7 he is having radiation to treat the cancer which is resultant of his Hep C which is now cured. It has been hard since his diagnosis and for the most part she is being strong for him but she finds herself crying at little things not even related to his care or health. She has h/o depression and has been on Prozac in the past and is wondering whether she needs it again.  She has been using Valium during the really stressful times like before she goes home to be with him on days that have been hard for her so she appears strong for him and on days when they have appts so she feels like she can hold herself together better.  They have a good outlook on this but it is hard and scary what they are getting ready to go through.  She is also worried that she might have a sinus infection.  She is having pressure around her eyes and some mild sinus congestion without rhinorrhea.  Patient Active Problem List   Diagnosis Date Noted  . Endometriosis 01/17/2014  . Sleep apnea with use of continuous positive airway pressure (CPAP) 06/27/2013  . Nocturia 06/27/2013  . Obesity, Class III, BMI 40-49.9 (morbid obesity)   . Obstructive sleep apnea (adult) (pediatric) 02/15/2013  . Hypersomnia, persistent   . HTN (hypertension) 02/03/2013  . DM (diabetes mellitus), type 2 02/03/2013    Current Outpatient Prescriptions on File Prior to Visit  Medication Sig Dispense Refill  . cyclobenzaprine (FLEXERIL) 5 MG tablet Take 1 tablet (5 mg total) by mouth 3 (three) times daily as needed for muscle spasms. 60 tablet 2  . ibuprofen (ADVIL,MOTRIN) 800 MG tablet Take 1 tablet (800 mg total) by  mouth every 8 (eight) hours as needed. 90 tablet 1  . cyclobenzaprine (AMRIX) 15 MG 24 hr capsule Take 1 capsule (15 mg total) by mouth daily as needed for muscle spasms. (Patient not taking: Reported on 03/26/2015) 30 capsule 0  . [DISCONTINUED] Ranitidine HCl (ZANTAC PO) Take 1 tablet by mouth 2 (two) times daily. Patient uses this medication for heartburn.     No current facility-administered medications on file prior to visit.    Allergies  Allergen Reactions  . Darvocet [Propoxyphene N-Acetaminophen] Hives and Itching  . Lisinopril Cough  . Metformin And Related Other (See Comments)    Chest pain  . Morphine And Related Nausea And Vomiting  . Percocet [Oxycodone-Acetaminophen] Itching    And headache  . Wellbutrin [Bupropion Hcl]     Hears voices  . Zofran [Ondansetron Hcl] Itching    Review of Systems  Constitutional: Negative for fever and chills.  Respiratory: Negative for cough.   Cardiovascular: Negative for chest pain and leg swelling.  Psychiatric/Behavioral: Positive for dysphoric mood. Negative for suicidal ideas and self-injury.   Objective:  BP 144/100 mmHg  Pulse 87  Temp(Src) 98.1 F (36.7 C)  Resp 16  Ht 5\' 1"  (1.549 m)  Wt 250 lb 6.4 oz (113.581 kg)  BMI 47.34 kg/m2  SpO2 97%  LMP 03/25/2015  Physical Exam  Constitutional: She is oriented to person, place, and time and well-developed, well-nourished,  and in no distress.  HENT:  Head: Normocephalic and atraumatic.  Right Ear: Hearing, tympanic membrane, external ear and ear canal normal.  Left Ear: Hearing, tympanic membrane, external ear and ear canal normal.  Nose: Nose normal.  Mouth/Throat: Uvula is midline, oropharynx is clear and moist and mucous membranes are normal.  Eyes: Conjunctivae are normal.  Neck: Normal range of motion.  Cardiovascular: Normal rate, regular rhythm and normal heart sounds.   No murmur heard. Pulmonary/Chest: Effort normal and breath sounds normal.  Neurological: She  is alert and oriented to person, place, and time. Gait normal.  Skin: Skin is warm and dry.  Psychiatric: Mood, memory, affect and judgment normal.  Vitals reviewed.  Spent >60min in room with the patient >50% was in counseling  Assessment and Plan :  Type 2 diabetes mellitus without complication - Plan: POCT glycosylated hemoglobin (Hb A1C), COMPLETE METABOLIC PANEL WITH GFR, Lipid panel, Insulin Pen Needle (PEN NEEDLES 31GX5/16") 31G X 8 MM MISC, Liraglutide (VICTOZA) 18 MG/3ML SOPN, canagliflozin (INVOKANA) 100 MG TABS tablet, glipiZIDE (GLUCOTROL) 10 MG tablet, glucose blood test strip, Insulin Pen Needle 32G X 5 MM MISC - for now until her husbands surgery she will continue her regimen of glipizide and victozia and then afterwards when she is comfortable she is going to change to victozia and invokana and get her off the glipizide to preserve her remaining pancrease function.  She will try and work harder on better eating but she knows she is going to have a hard time while they are in [place] for his treatment.  Essential hypertension - Plan: metoprolol tartrate (LOPRESSOR) 25 MG tablet - elevated today - she is really stressed today - she will monitor at home and we will recheck in a month  Lumbar strain, initial encounter - Plan: celecoxib (CELEBREX) 200 MG capsule - continue current medications  Insomnia - Plan: diazepam (VALIUM) 2 MG tablet - we discussed her recent increased use - she states she does not want to use more than 15 pills a month but she knows with the stress that she needs it  Bilateral leg edema - Plan: furosemide (LASIX) 20 MG tablet - continue current medication  Endometriosis - Plan: HYDROcodone-acetaminophen (NORCO/VICODIN) 5-325 MG per tablet - gave her her Rx for the month  Depression - Plan: FLUoxetine (PROZAC) 20 MG capsule - restart her SSRI with her recent increase - she will recheck in a month to make sure that her mood is improved  Stress reaction - Plan:  FLUoxetine (PROZAC) 20 MG capsule  Pure hypercholesterolemia - Plan: rosuvastatin (CRESTOR) 40 MG tablet - check labs  Vaginal itching - Plan: fluconazole (DIFLUCAN) 150 MG tablet -   Seasonal allergies - Plan: mometasone (NASONEX) 50 MCG/ACT nasal spray - I suspect allergies is the cause of her sinus pressure - she will try this and mucinex and push fluids to see if her symptoms improve.  Windell Hummingbird PA-C  [facility] 03/26/2015 8:07 PM

## 2015-03-27 ENCOUNTER — Telehealth: Payer: Self-pay

## 2015-03-27 DIAGNOSIS — F32A Depression, unspecified: Secondary | ICD-10-CM

## 2015-03-27 DIAGNOSIS — E119 Type 2 diabetes mellitus without complications: Secondary | ICD-10-CM

## 2015-03-27 DIAGNOSIS — F430 Acute stress reaction: Secondary | ICD-10-CM

## 2015-03-27 DIAGNOSIS — F329 Major depressive disorder, single episode, unspecified: Secondary | ICD-10-CM

## 2015-03-27 MED ORDER — FLUOXETINE HCL 20 MG PO CAPS: 20.0000 mg | ORAL_CAPSULE | Freq: Every day | ORAL | Status: DC

## 2015-03-27 MED ORDER — CANAGLIFLOZIN 100 MG PO TABS: 100.0000 mg | ORAL_TABLET | Freq: Every day | ORAL | Status: DC

## 2015-03-27 NOTE — Telephone Encounter (Signed)
Medications resent

## 2015-03-27 NOTE — Telephone Encounter (Signed)
Patient states of the fifteen medications two were not received by [facility] in [place].   Resend [facility]  And PROZAC

## 2015-04-02 ENCOUNTER — Encounter: Payer: Self-pay | Admitting: Family Medicine

## 2015-05-12 ENCOUNTER — Telehealth: Payer: Self-pay

## 2015-05-12 DIAGNOSIS — F32A Depression, unspecified: Secondary | ICD-10-CM

## 2015-05-12 DIAGNOSIS — F430 Acute stress reaction: Secondary | ICD-10-CM

## 2015-05-12 DIAGNOSIS — F329 Major depressive disorder, single episode, unspecified: Secondary | ICD-10-CM

## 2015-05-12 DIAGNOSIS — G4700 Insomnia, unspecified: Secondary | ICD-10-CM

## 2015-05-12 DIAGNOSIS — N809 Endometriosis, unspecified: Secondary | ICD-10-CM

## 2015-05-12 NOTE — Telephone Encounter (Signed)
Appt on 9/15

## 2015-05-12 NOTE — Telephone Encounter (Signed)
Pt had to reschedule her appt with Judson Roch but is in need of her PROZAC 20 MG, VALIUM 2 MG AND HYDROCODONE 5-325 MG Please call [phone] when ready

## 2015-05-14 ENCOUNTER — Ambulatory Visit: Payer: Self-pay | Admitting: Physician Assistant

## 2015-05-14 MED ORDER — FLUOXETINE HCL 20 MG PO CAPS
20.0000 mg | ORAL_CAPSULE | Freq: Every day | ORAL | Status: DC
Start: 2015-05-14 — End: 2015-06-12

## 2015-05-14 NOTE — Telephone Encounter (Signed)
I have refilled her Prozac - I have pended the 2 controlled substances - would someone please refill these for me.  Thanks.

## 2015-05-15 MED ORDER — HYDROCODONE-ACETAMINOPHEN 5-325 MG PO TABS: 1.0000 | ORAL_TABLET | Freq: Three times a day (TID) | ORAL | Status: DC | PRN

## 2015-05-15 MED ORDER — DIAZEPAM 2 MG PO TABS: 2.0000 mg | ORAL_TABLET | Freq: Two times a day (BID) | ORAL | Status: DC | PRN

## 2015-05-15 NOTE — Telephone Encounter (Signed)
Called pt to notify 2 controlled are ready for p/up here and prozac sent to pharm.

## 2015-05-15 NOTE — Telephone Encounter (Signed)
Rx printed

## 2015-06-12 ENCOUNTER — Ambulatory Visit (INDEPENDENT_AMBULATORY_CARE_PROVIDER_SITE_OTHER): Payer: BLUE CROSS/BLUE SHIELD | Admitting: Physician Assistant

## 2015-06-12 ENCOUNTER — Encounter: Payer: Self-pay | Admitting: Physician Assistant

## 2015-06-12 VITALS — BP 138/74 | HR 82 | Temp 98.7°F | Resp 16 | Ht 61.0 in | Wt 251.6 lb

## 2015-06-12 DIAGNOSIS — F329 Major depressive disorder, single episode, unspecified: Secondary | ICD-10-CM | POA: Diagnosis not present

## 2015-06-12 DIAGNOSIS — N809 Endometriosis, unspecified: Secondary | ICD-10-CM | POA: Diagnosis not present

## 2015-06-12 DIAGNOSIS — F32A Depression, unspecified: Secondary | ICD-10-CM

## 2015-06-12 DIAGNOSIS — F430 Acute stress reaction: Secondary | ICD-10-CM

## 2015-06-12 DIAGNOSIS — G4700 Insomnia, unspecified: Secondary | ICD-10-CM | POA: Diagnosis not present

## 2015-06-12 MED ORDER — FLUOXETINE HCL 20 MG PO CAPS: 20.0000 mg | ORAL_CAPSULE | Freq: Two times a day (BID) | ORAL | Status: DC

## 2015-06-12 MED ORDER — DIAZEPAM 2 MG PO TABS
2.0000 mg | ORAL_TABLET | Freq: Two times a day (BID) | ORAL | Status: DC | PRN
Start: 2015-06-12 — End: 2015-07-10

## 2015-06-12 MED ORDER — HYDROCODONE-ACETAMINOPHEN 5-325 MG PO TABS: 1.0000 | ORAL_TABLET | Freq: Three times a day (TID) | ORAL | Status: DC | PRN

## 2015-06-12 MED ORDER — PHENTERMINE HCL 37.5 MG PO CAPS: 37.5000 mg | ORAL_CAPSULE | ORAL | Status: DC

## 2015-06-12 NOTE — Progress Notes (Signed)
[name]  MRN: [id number] DOB: 1966-03-08  Subjective:  Pt presents to clinic for a recheck. Her mood and anxiety is still causing her problems.  She would like to increase her Prozac but she would like to take it bid because she has drowsiness with it.  She is having increased back pain and she really thinks it has to do with her weight.  She feels like she is eating healthy Calories a day - 1500-1800 and mostly foods from the store perimeter.  She rides an exercise bike 5x week for about 10 miles minimal.  She is frustrated with her weight.  She has thought about weight loss surgery but is scared of all the possible complications.  She has been on weight loss pills in the past and really wants to try them.  She knows there are risks and side affects but she still would like to try them. Her goal is to be off her medications and she knows weight loss is the only way that is going to be a possibility.   Patient Active Problem List   Diagnosis Date Noted  . Endometriosis 01/17/2014  . Sleep apnea with use of continuous positive airway pressure (CPAP) 06/27/2013  . Nocturia 06/27/2013  . Obesity, Class III, BMI 40-49.9 (morbid obesity)   . Obstructive sleep apnea (adult) (pediatric) 02/15/2013  . Hypersomnia, persistent   . HTN (hypertension) 02/03/2013  . DM (diabetes mellitus), type 2 02/03/2013    Current Outpatient Prescriptions on File Prior to Visit  Medication Sig Dispense Refill  . celecoxib (CELEBREX) 200 MG capsule Take 1 capsule (200 mg total) by mouth daily. 30 capsule 2  . cyclobenzaprine (FLEXERIL) 5 MG tablet Take 1 tablet (5 mg total) by mouth 3 (three) times daily as needed for muscle spasms. 60 tablet 2  . furosemide (LASIX) 20 MG tablet Take 0.5-1 tablets (10-20 mg total) by mouth daily. 30 tablet 0  . glipiZIDE (GLUCOTROL) 10 MG tablet TAKE ONE TABLET BY MOUTH TWICE DAILY BEFORE MEAL(S) 60 tablet 0  . glucose blood test strip 1 each by Other route 2 (two) times  daily. Use as instructed 100 each 12  . ibuprofen (ADVIL,MOTRIN) 800 MG tablet Take 1 tablet (800 mg total) by mouth every 8 (eight) hours as needed. 90 tablet 1  . Insulin Pen Needle (PEN NEEDLES 31GX5/16") 31G X 8 MM MISC Inject insulin daily. PATIENT NEEDS OFFICE VISIT FOR ADDITIONAL REFILLS 100 each 0  . Insulin Pen Needle 32G X 5 MM MISC 1 application by Does not apply route daily. 100 each 12  . Liraglutide (VICTOZA) 18 MG/3ML SOPN INJECT 0.3 MLS (1.8 MG TOTAL) SUBCUTANEOUSLY  DAILY 3 pen 2  . metoprolol tartrate (LOPRESSOR) 25 MG tablet Take 1 tablet (25 mg total) by mouth 2 (two) times daily. 60 tablet 2  . mometasone (NASONEX) 50 MCG/ACT nasal spray Place 2 sprays into the nose daily. 17 g 12  . rosuvastatin (CRESTOR) 40 MG tablet Take 1 tablet (40 mg total) by mouth daily. 90 tablet 3  . [DISCONTINUED] Ranitidine HCl (ZANTAC PO) Take 1 tablet by mouth 2 (two) times daily. Patient uses this medication for heartburn.     No current facility-administered medications on file prior to visit.    Allergies  Allergen Reactions  . Darvocet [Propoxyphene N-Acetaminophen] Hives and Itching  . Lisinopril Cough  . Metformin And Related Other (See Comments)    Chest pain  . Morphine And Related Nausea And Vomiting  . Percocet [Oxycodone-Acetaminophen]  Itching    And headache  . Wellbutrin [Bupropion Hcl]     Hears voices  . Zofran [Ondansetron Hcl] Itching    Review of Systems Objective:  BP 138/74 mmHg  Pulse 82  Temp(Src) 98.7 F (37.1 C) (Oral)  Resp 16  Ht 5\' 1"  (1.549 m)  Wt 251 lb 9.6 oz (114.125 kg)  BMI 47.56 kg/m2  SpO2 97%  LMP 05/16/2015  Physical Exam  Constitutional: She is oriented to person, place, and time and well-developed, well-nourished, and in no distress.  HENT:  Head: Normocephalic and atraumatic.  Right Ear: Hearing and external ear normal.  Left Ear: Hearing and external ear normal.  Eyes: Conjunctivae are normal.  Neck: Normal range of motion.    Pulmonary/Chest: Effort normal.  Neurological: She is alert and oriented to person, place, and time. Gait normal.  Skin: Skin is warm and dry.  Psychiatric: Mood, memory, affect and judgment normal.  Vitals reviewed.  Spent 45 mins with the patient in counseling. Assessment and Plan :  Endometriosis - Plan: HYDROcodone-acetaminophen (NORCO/VICODIN) 5-325 MG per tablet - refill pt meds   Depression - Plan: FLUoxetine (PROZAC) 20 MG capsule - increase medication for current increase in anxiety  Stress reaction - Plan: FLUoxetine (PROZAC) 20 MG capsule  Insomnia - Plan: diazepam (VALIUM) 2 MG tablet - she has been using more of this recently with the increase in stress.  She had a stressful summer with husband liver cancer treatment - we have increased her prozac today which will hopefully decrease her use of this medication in the upcoming months.  Obesity, Class III, BMI 40-49.9 (morbid obesity) - Plan: phentermine 37.5 MG capsule - we discussed the risks and benefits of this medication - she has been trying and succeeding with healthy lifestyle changes but she is seeing no weight loss - she will try to decrease her calories down and she will continue her exercise. She has made changes since her last visit with me. We will recheck in a month and depending on her weight loss I think that nutrition will be helpful.  We talked about this is not a long term solution but she would like a jump start.  Windell Hummingbird PA-C  [facility] 06/12/2015 4:54 PM

## 2015-06-14 ENCOUNTER — Other Ambulatory Visit: Payer: Self-pay | Admitting: Physician Assistant

## 2015-06-16 NOTE — Telephone Encounter (Signed)
[name], you just saw pt, but don't see DM discussed. OK to give RFs?

## 2015-07-10 ENCOUNTER — Encounter: Payer: Self-pay | Admitting: Physician Assistant

## 2015-07-10 ENCOUNTER — Ambulatory Visit (INDEPENDENT_AMBULATORY_CARE_PROVIDER_SITE_OTHER): Payer: BLUE CROSS/BLUE SHIELD | Admitting: Physician Assistant

## 2015-07-10 VITALS — BP 137/77 | HR 73 | Temp 98.3°F | Resp 16 | Ht 61.0 in | Wt 247.0 lb

## 2015-07-10 DIAGNOSIS — G4700 Insomnia, unspecified: Secondary | ICD-10-CM

## 2015-07-10 DIAGNOSIS — N809 Endometriosis, unspecified: Secondary | ICD-10-CM | POA: Diagnosis not present

## 2015-07-10 DIAGNOSIS — E66813 Obesity, class 3: Secondary | ICD-10-CM

## 2015-07-10 MED ORDER — DIAZEPAM 2 MG PO TABS: 2.0000 mg | ORAL_TABLET | Freq: Two times a day (BID) | ORAL | Status: DC | PRN

## 2015-07-10 MED ORDER — HYDROCODONE-ACETAMINOPHEN 5-325 MG PO TABS: 1.0000 | ORAL_TABLET | Freq: Three times a day (TID) | ORAL | Status: DC | PRN

## 2015-07-10 MED ORDER — PHENTERMINE HCL 37.5 MG PO CAPS: 37.5000 mg | ORAL_CAPSULE | ORAL | Status: DC

## 2015-07-10 NOTE — Progress Notes (Signed)
[name]  MRN: [id number] DOB: 1966-01-10  Subjective:  Pt presents to clinic for a weight check after being on Phetermine for a month.  She feels like it is working - she feels like her cravings has decreased.  She is really interested in making the change that will lead to weight loss at this time.  She is decreasing her portion size and she knows that she needs to decrease it even more.  Decrease her glizide to once a day - she is not ready to come off it completely - her sugars are decreasing since the change in her eating habits. Not drinking enough water - she knows this and she is trying to do better  Knows she needs to exercise more   heavy meal in the am - english muffin cream cheese and apple Salad at lunch -  With meat and cheese Pack of crackers daily -   Not keeping track of her calories.  Feels better - clothes are fitting better and her chin feels smaller.  She also needs refills of her medications.  Patient Active Problem List   Diagnosis Date Noted  . Endometriosis 01/17/2014  . Sleep apnea with use of continuous positive airway pressure (CPAP) 06/27/2013  . Nocturia 06/27/2013  . Obesity, Class III, BMI 40-49.9 (morbid obesity) ([place])   . Obstructive sleep apnea (adult) (pediatric) 02/15/2013  . Hypersomnia, persistent   . HTN (hypertension) 02/03/2013  . DM (diabetes mellitus), type 2 ([place]) 02/03/2013    Current Outpatient Prescriptions on File Prior to Visit  Medication Sig Dispense Refill  . celecoxib (CELEBREX) 200 MG capsule Take 1 capsule (200 mg total) by mouth daily. 30 capsule 2  . cyclobenzaprine (FLEXERIL) 5 MG tablet Take 1 tablet (5 mg total) by mouth 3 (three) times daily as needed for muscle spasms. 60 tablet 2  . FLUoxetine (PROZAC) 20 MG capsule Take 1 capsule (20 mg total) by mouth 2 (two) times daily. (Patient taking differently: Take 20 mg by mouth daily. ) 60 capsule 1  . furosemide (LASIX) 20 MG tablet Take 0.5-1 tablets (10-20 mg  total) by mouth daily. 30 tablet 0  . glipiZIDE (GLUCOTROL) 10 MG tablet TAKE ONE TABLET BY MOUTH TWICE DAILY BEFORE MEAL(S) (Patient taking differently: TAKE ONE TABLET BY MOUTH DAILY BEFORE MEAL(S)) 60 tablet 5  . glucose blood test strip 1 each by Other route 2 (two) times daily. Use as instructed 100 each 12  . ibuprofen (ADVIL,MOTRIN) 800 MG tablet Take 1 tablet (800 mg total) by mouth every 8 (eight) hours as needed. 90 tablet 1  . Insulin Pen Needle (PEN NEEDLES 31GX5/16") 31G X 8 MM MISC Inject insulin daily. PATIENT NEEDS OFFICE VISIT FOR ADDITIONAL REFILLS 100 each 0  . Insulin Pen Needle 32G X 5 MM MISC 1 application by Does not apply route daily. 100 each 12  . Liraglutide (VICTOZA) 18 MG/3ML SOPN INJECT 0.3 MLS (1.8 MG TOTAL) SUBCUTANEOUSLY  DAILY 3 pen 2  . metoprolol tartrate (LOPRESSOR) 25 MG tablet Take 1 tablet (25 mg total) by mouth 2 (two) times daily. 60 tablet 2  . mometasone (NASONEX) 50 MCG/ACT nasal spray Place 2 sprays into the nose daily. 17 g 12  . rosuvastatin (CRESTOR) 40 MG tablet Take 1 tablet (40 mg total) by mouth daily. 90 tablet 3  . [DISCONTINUED] Ranitidine HCl (ZANTAC PO) Take 1 tablet by mouth 2 (two) times daily. Patient uses this medication for heartburn.     No current facility-administered medications  on file prior to visit.    Allergies  Allergen Reactions  . Darvocet [Propoxyphene N-Acetaminophen] Hives and Itching  . Lisinopril Cough  . Metformin And Related Other (See Comments)    Chest pain  . Morphine And Related Nausea And Vomiting  . Percocet [Oxycodone-Acetaminophen] Itching    And headache  . Wellbutrin [Bupropion Hcl]     Hears voices  . Zofran [Ondansetron Hcl] Itching    Review of Systems Objective:  BP 137/77 mmHg  Pulse 73  Temp(Src) 98.3 F (36.8 C)  Resp 16  Ht 5\' 1"  (1.549 m)  Wt 247 lb (112.038 kg)  BMI 46.69 kg/m2  LMP 05/16/2015  Physical Exam  Constitutional: She is oriented to person, place, and time and  well-developed, well-nourished, and in no distress.  HENT:  Head: Normocephalic and atraumatic.  Right Ear: Hearing and external ear normal.  Left Ear: Hearing and external ear normal.  Eyes: Conjunctivae are normal.  Neck: Normal range of motion.  Pulmonary/Chest: Effort normal.  Neurological: She is alert and oriented to person, place, and time. Gait normal.  Skin: Skin is warm and dry.  Psychiatric: Mood, memory, affect and judgment normal.  Vitals reviewed.   Spent >30 mins in counseling the patient , >50% of visit was spent in counseling Assessment and Plan :  Obesity, Class III, BMI 40-49.9 (morbid obesity) ([place]) - Plan: phentermine 37.5 MG capsule - talk at length about types of food to eat - she needs to work on more frequent meals with more protein  Insomnia - Plan: diazepam (VALIUM) 2 MG tablet - continue current medications - she did not increase her Prozac because she is not completely sure she needs it - she is trying to worry less by focusing on herself - we discussed that her use of Valium will help to indicate whether she needs more prozac and she is planning on decreasing her am dose because she feels like it might be a habit at this time vs a true need.  Endometriosis - Plan: HYDROcodone-acetaminophen (NORCO/VICODIN) 5-325 MG tablet - continue current medications  Windell Hummingbird PA-C  [facility] 07/10/2015 5:40 PM

## 2015-07-10 NOTE — Patient Instructions (Signed)
My fitness PAL -   Calories - 1500 a day approximately Protein goal is 80-120 g a day

## 2015-07-13 ENCOUNTER — Other Ambulatory Visit: Payer: Self-pay | Admitting: Physician Assistant

## 2015-08-05 ENCOUNTER — Telehealth: Payer: Self-pay

## 2015-08-05 NOTE — Telephone Encounter (Signed)
Will you refill her meds if she does not come to appt?

## 2015-08-05 NOTE — Telephone Encounter (Signed)
Pt states she has an appt on 11/17 but has to take husband to [place] for drs appt,she is concerned that Windell Hummingbird will  Not refill her meds if she cannot make that appt,she did not want to cancel the appt in case she can work something out,but Wants Judson Roch to call to address concerns   Best phone is [phone]

## 2015-08-07 MED ORDER — PHENTERMINE HCL 37.5 MG PO CAPS: 37.5000 mg | ORAL_CAPSULE | ORAL | Status: DC

## 2015-08-07 NOTE — Telephone Encounter (Signed)
I have written her medications - she can make a f/u with me in 2 weeks if I have an opening.

## 2015-08-08 NOTE — Telephone Encounter (Signed)
Spoke to pt and informed her that she can pick up her rx. She is going to call back to re-schedule her appt.

## 2015-08-14 ENCOUNTER — Ambulatory Visit: Payer: Self-pay | Admitting: Physician Assistant

## 2015-08-16 ENCOUNTER — Other Ambulatory Visit: Payer: Self-pay | Admitting: Physician Assistant

## 2015-09-05 ENCOUNTER — Ambulatory Visit: Payer: BLUE CROSS/BLUE SHIELD | Admitting: Physician Assistant

## 2015-09-22 ENCOUNTER — Other Ambulatory Visit: Payer: Self-pay | Admitting: Physician Assistant

## 2015-09-24 ENCOUNTER — Ambulatory Visit (INDEPENDENT_AMBULATORY_CARE_PROVIDER_SITE_OTHER): Payer: BLUE CROSS/BLUE SHIELD | Admitting: Physician Assistant

## 2015-09-24 VITALS — BP 160/98 | HR 68 | Temp 98.3°F | Resp 18 | Ht 61.0 in | Wt 250.0 lb

## 2015-09-24 DIAGNOSIS — S39012A Strain of muscle, fascia and tendon of lower back, initial encounter: Secondary | ICD-10-CM

## 2015-09-24 DIAGNOSIS — N809 Endometriosis, unspecified: Secondary | ICD-10-CM

## 2015-09-24 DIAGNOSIS — R519 Headache, unspecified: Secondary | ICD-10-CM

## 2015-09-24 DIAGNOSIS — F32A Depression, unspecified: Secondary | ICD-10-CM

## 2015-09-24 DIAGNOSIS — G4700 Insomnia, unspecified: Secondary | ICD-10-CM

## 2015-09-24 DIAGNOSIS — F329 Major depressive disorder, single episode, unspecified: Secondary | ICD-10-CM | POA: Diagnosis not present

## 2015-09-24 DIAGNOSIS — M62838 Other muscle spasm: Secondary | ICD-10-CM

## 2015-09-24 DIAGNOSIS — Z130 Encounter for screening for diseases of the blood and blood-forming organs and certain disorders involving the immune mechanism: Secondary | ICD-10-CM | POA: Diagnosis not present

## 2015-09-24 DIAGNOSIS — R600 Localized edema: Secondary | ICD-10-CM | POA: Diagnosis not present

## 2015-09-24 DIAGNOSIS — E7800 Pure hypercholesterolemia, unspecified: Secondary | ICD-10-CM | POA: Diagnosis not present

## 2015-09-24 DIAGNOSIS — F430 Acute stress reaction: Secondary | ICD-10-CM

## 2015-09-24 DIAGNOSIS — I10 Essential (primary) hypertension: Secondary | ICD-10-CM | POA: Diagnosis not present

## 2015-09-24 DIAGNOSIS — E119 Type 2 diabetes mellitus without complications: Secondary | ICD-10-CM | POA: Diagnosis not present

## 2015-09-24 DIAGNOSIS — R51 Headache: Secondary | ICD-10-CM | POA: Diagnosis not present

## 2015-09-24 DIAGNOSIS — E66813 Obesity, class 3: Secondary | ICD-10-CM

## 2015-09-24 LAB — LIPID PANEL
CHOL/HDL RATIO: 9 ratio — AB (ref <=5.0)
Cholesterol: 289 mg/dL — ABNORMAL HIGH (ref 125–200)
HDL: 32 mg/dL — AB (ref >=46)
Triglycerides: 463 mg/dL — ABNORMAL HIGH (ref <150)

## 2015-09-24 LAB — HEMOGLOBIN A1C
HEMOGLOBIN A1C: 9.5 % — AB (ref <5.7)
Mean Plasma Glucose: 226 mg/dL — ABNORMAL HIGH (ref <117)

## 2015-09-24 LAB — CBC WITH DIFFERENTIAL/PLATELET
BASOS ABS: 0 10*3/uL (ref 0.0–0.1)
Basophils Relative: 0 % (ref 0–1)
Eosinophils Absolute: 0.2 10*3/uL (ref 0.0–0.7)
Eosinophils Relative: 2 % (ref 0–5)
HCT: 41.9 % (ref 36.0–46.0)
HEMOGLOBIN: 13.9 g/dL (ref 12.0–15.0)
LYMPHS ABS: 2.7 10*3/uL (ref 0.7–4.0)
LYMPHS PCT: 31 % (ref 12–46)
MCH: 29.8 pg (ref 26.0–34.0)
MCHC: 33.2 g/dL (ref 30.0–36.0)
MCV: 89.9 fL (ref 78.0–100.0)
MONO ABS: 0.5 10*3/uL (ref 0.1–1.0)
MONOS PCT: 6 % (ref 3–12)
MPV: 8.8 fL (ref 8.6–12.4)
NEUTROS ABS: 5.2 10*3/uL (ref 1.7–7.7)
Neutrophils Relative %: 61 % (ref 43–77)
PLATELETS: 319 10*3/uL (ref 150–400)
RBC: 4.66 MIL/uL (ref 3.87–5.11)
RDW: 14 % (ref 11.5–15.5)
WBC: 8.6 10*3/uL (ref 4.0–10.5)

## 2015-09-24 LAB — COMPLETE METABOLIC PANEL WITH GFR
ALBUMIN: 4 g/dL (ref 3.6–5.1)
ALK PHOS: 81 U/L (ref 33–115)
ALT: 33 U/L — ABNORMAL HIGH (ref 6–29)
AST: 25 U/L (ref 10–35)
BILIRUBIN TOTAL: 0.4 mg/dL (ref 0.2–1.2)
BUN: 17 mg/dL (ref 7–25)
CO2: 23 mmol/L (ref 20–31)
Calcium: 9.3 mg/dL (ref 8.6–10.2)
Chloride: 103 mmol/L (ref 98–110)
Creat: 0.81 mg/dL (ref 0.50–1.10)
GFR, EST NON AFRICAN AMERICAN: 86 mL/min (ref >=60)
GFR, Est African American: >89 mL/min (ref >=60)
GLUCOSE: 227 mg/dL — AB (ref 65–99)
Potassium: 4.5 mmol/L (ref 3.5–5.3)
SODIUM: 137 mmol/L (ref 135–146)
TOTAL PROTEIN: 6.5 g/dL (ref 6.1–8.1)

## 2015-09-24 MED ORDER — FUROSEMIDE 20 MG PO TABS: 10.0000 mg | ORAL_TABLET | Freq: Every day | ORAL | Status: DC

## 2015-09-24 MED ORDER — CYCLOBENZAPRINE HCL 5 MG PO TABS: 5.0000 mg | ORAL_TABLET | Freq: Three times a day (TID) | ORAL | Status: DC | PRN

## 2015-09-24 MED ORDER — LIRAGLUTIDE 18 MG/3ML ~~LOC~~ SOPN: PEN_INJECTOR | SUBCUTANEOUS | Status: DC

## 2015-09-24 MED ORDER — DIAZEPAM 2 MG PO TABS: 2.0000 mg | ORAL_TABLET | Freq: Three times a day (TID) | ORAL | Status: DC | PRN

## 2015-09-24 MED ORDER — ROSUVASTATIN CALCIUM 40 MG PO TABS: 40.0000 mg | ORAL_TABLET | Freq: Every day | ORAL | Status: DC

## 2015-09-24 MED ORDER — KETOROLAC TROMETHAMINE 60 MG/2ML IM SOLN
60.0000 mg | Freq: Once | INTRAMUSCULAR | Status: AC
  Administered 2015-09-24: 60 mg via INTRAMUSCULAR

## 2015-09-24 MED ORDER — INSULIN PEN NEEDLE 32G X 5 MM MISC: 1.0000 "application " | Freq: Every day | Status: DC

## 2015-09-24 MED ORDER — PHENTERMINE HCL 37.5 MG PO CAPS: 37.5000 mg | ORAL_CAPSULE | ORAL | Status: DC

## 2015-09-24 MED ORDER — GLIPIZIDE 10 MG PO TABS: 10.0000 mg | ORAL_TABLET | Freq: Two times a day (BID) | ORAL | Status: DC

## 2015-09-24 MED ORDER — METOPROLOL TARTRATE 25 MG PO TABS: 25.0000 mg | ORAL_TABLET | Freq: Two times a day (BID) | ORAL | Status: DC

## 2015-09-24 MED ORDER — HYDROCODONE-ACETAMINOPHEN 5-325 MG PO TABS: 1.0000 | ORAL_TABLET | Freq: Three times a day (TID) | ORAL | Status: DC | PRN

## 2015-09-24 MED ORDER — FLUOXETINE HCL 20 MG PO CAPS: 20.0000 mg | ORAL_CAPSULE | Freq: Three times a day (TID) | ORAL | Status: DC

## 2015-09-24 NOTE — Progress Notes (Signed)
[name]  MRN: [id number] DOB: 1966-09-27  Subjective:  Pt presents to clinic for multiple issues 1- headache and left sided neck and shoulder and arm pain.  Started about 4-5 days ago when she used a Media planner and then afterwards he felt tight and the tightness has worsened and is now causing her arm to ache and pain into her neck when she moves her neck and her left arm.  She has a resulting headache.  She feels like it is a big muscle spasm and she took several days of flexeril while she was off for the holidays but it did not seem to help much - she took a few doses of celebrex and that seemed to help the most - it was worse with sleeping because she has a hard time finding a good position.  She is having no weakness or paresthesias and no photophobia or other migraine like symptoms. 2- would like her diabetes checked - she has not been eating great with the holidays but she has been taking her medications - she has not been checking her glucose at home 3- BP is high - she does not check at home 4- she stopped the phenteramine - she took it for about 7 weeks and then stopped because she knew the food temptations were in her house and she would eat them and she did not want to take a medication that she knew she would emotionally eat through - since the she has taken a very detailed notes of her eating habits for the last 4-5 weeks - she has recorded what and how much she eats and her emotions at the time  -she can now admit that she is an emotional eater and she now knows that she eats different food items and differing amounts based on her current emotion - she feels that this might be her breakthrough for her weight loss - she would like at the beginning of the year to try the phenteramine when she is aware of her eating habits - she has asked her family to be her aids vs her enablers and to now keep foods that she knows she has a problem with in the house - she and her boss have gotten  an exercise bike for the office and she plans on riding during her lunch - she also thinks some of there weight gain or lack of weight loss is 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  Patient Active Problem List   Diagnosis Date Noted  . Endometriosis 01/17/2014  . Sleep apnea with use of continuous positive airway pressure (CPAP) 06/27/2013  . Nocturia 06/27/2013  . Obesity, Class III, BMI 40-49.9 (morbid obesity) ([place])   . Obstructive sleep apnea (adult) (pediatric) 02/15/2013  . Hypersomnia, persistent   . HTN (hypertension) 02/03/2013  . DM (diabetes mellitus), type 2 ([place]) 02/03/2013    Current Outpatient Prescriptions on File Prior to Visit  Medication Sig Dispense Refill  . celecoxib (CELEBREX) 200 MG capsule Take 1 capsule (200 mg total) by mouth daily. 30 capsule 2  . glucose blood test strip 1 each by Other route 2 (two) times daily. Use as instructed 100 each 12  . ibuprofen (ADVIL,MOTRIN) 800 MG tablet Take 1 tablet (800 mg total) by mouth every 8 (eight) hours as needed. 90 tablet 1  . mometasone (NASONEX) 50 MCG/ACT nasal spray Place 2 sprays into the nose daily. (Patient not taking: Reported on 09/24/2015) 17 g 12  . [DISCONTINUED]  Ranitidine HCl (ZANTAC PO) Take 1 tablet by mouth 2 (two) times daily. Patient uses this medication for heartburn.     No current facility-administered medications on file prior to visit.    Allergies  Allergen Reactions  . Darvocet [Propoxyphene N-Acetaminophen] Hives and Itching  . Lisinopril Cough  . Metformin And Related Other (See Comments)    Chest pain  . Morphine And Related Nausea And Vomiting  . Percocet [Oxycodone-Acetaminophen] Itching    And headache  . Wellbutrin [Bupropion Hcl]     Hears voices  . Zofran [Ondansetron Hcl] Itching    Review of Systems  Constitutional: Negative for fever and chills.  Respiratory: Negative for shortness of breath.   Cardiovascular: Negative for chest pain, palpitations and leg swelling.    Musculoskeletal: Positive for neck pain (left only).  Skin: Negative for wound.  Neurological: Positive for headaches. Negative for dizziness and numbness.   Objective:  BP 160/98 mmHg  Pulse 68  Temp(Src) 98.3 F (36.8 C) (Oral)  Resp 18  Ht 5\' 1"  (1.549 m)  Wt 250 lb (113.399 kg)  BMI 47.26 kg/m2  SpO2 96%  LMP 09/20/2015  Physical Exam  Constitutional: She is oriented to person, place, and time and well-developed, well-nourished, and in no distress.  HENT:  Head: Normocephalic and atraumatic.  Right Ear: Hearing and external ear normal.  Left Ear: Hearing and external ear normal.  Eyes: Conjunctivae and EOM are normal. Pupils are equal, round, and reactive to light.  Neck: Normal range of motion.  Cardiovascular: Normal rate, regular rhythm and normal heart sounds.   No murmur heard. Pulmonary/Chest: Effort normal and breath sounds normal.  Musculoskeletal:       Cervical back: She exhibits decreased range of motion (decrease rotaion to the right due to pain on the left) and spasm (levator scapula in spasm - palpable and TTP). She exhibits no tenderness.       Right lower leg: She exhibits no edema.       Left lower leg: She exhibits no edema.  Lymphadenopathy:    She has no cervical adenopathy.  Neurological: She is alert and oriented to person, place, and time. She has normal motor skills, normal sensation, normal strength, normal reflexes and intact cranial nerves. She displays no weakness, facial symmetry and normal reflexes. She has a normal Romberg Test. Gait normal. Coordination normal.  Skin: Skin is warm and dry.  Psychiatric: Mood, memory, affect and judgment normal.  Vitals reviewed.   Assessment and Plan :  Insomnia - Plan: diazepam (VALIUM) 2 MG tablet, VITAMIN D 25 Hydroxy (Vit-D Deficiency, Fractures), Care order/instruction - not sleeping due to her pain - she has the stress controlled she feels like with the increase dose in prozac  Endometriosis -  Plan: HYDROcodone-acetaminophen (NORCO/VICODIN) 5-325 MG tablet - continue prn use  Pure hypercholesterolemia - Plan: Lipid panel, rosuvastatin (CRESTOR) 40 MG tablet continue medications - we need to get her DM in controlled and that should help improve her cholesterol - her weight loss and increased exercise will also help this  Lumbar strain, initial encounter - Plan: cyclobenzaprine (FLEXERIL) 5 MG tablet - continue this for prn use at night  Depression - Plan: FLUoxetine (PROZAC) 20 MG capsule - she feels like the increase in prozac from 20-40 helped - she is ready to increase the dose but will take her dose tid because she cannot tolerate 40mg  at a dose due to fatigue and drowsiness  Stress reaction - Plan: FLUoxetine (PROZAC) 20  MG capsule  Bilateral leg edema - Plan: CBC with Differential/Platelet, furosemide (LASIX) 20 MG tablet  Type 2 diabetes mellitus without complication, without long-term current use of insulin (HCC) - Plan: COMPLETE METABOLIC PANEL WITH GFR, Liraglutide (VICTOZA) 18 MG/3ML SOPN, Hemoglobin A1c, glipiZIDE (GLUCOTROL) 10 MG tablet, Insulin Pen Needle 32G X 5 MM MISC - worse control than in a while - unsure what it is related to - she is not wanting to add medications at this time because she wants to really give weight loss a chance and now that she has found her problem with food we will see what happens in the next 3 months -   Essential hypertension - Plan: metoprolol tartrate (LOPRESSOR) 25 MG tablet - BP is not controlled today - she is having a terrible headache so she will check at home and let me know if it continues to run high  Obesity, Class III, BMI 40-49.9 (morbid obesity) ([place]) - Plan: phentermine 37.5 MG capsule - continue her weight loss measures of dietary changes and exercise - we will give this another 2 months and see the outcome - we discussed again that weight loss is a life time of change and this medications will not create the changes needed to  maintain weight loss  Screening for deficiency anemia  Muscle spasm - Plan: ketorolac (TORADOL) injection 60 mg  Acute intractable headache, unspecified headache type - Plan: ketorolac (TORADOL) injection 60 mg - pt will use the valium as a muscle relaxer as it does not make her groggy during the day - she can use 2-4 mg tid for the spasm of the left shoulder neck muscles and I think that is causing her headache - she will recheck if anything changes  She agrees and understands the plan.  Windell Hummingbird PA-C  [facility] 09/24/2015 3:22 PM

## 2015-09-25 ENCOUNTER — Encounter: Payer: Self-pay | Admitting: Physician Assistant

## 2015-09-25 ENCOUNTER — Ambulatory Visit: Payer: BLUE CROSS/BLUE SHIELD | Admitting: Physician Assistant

## 2015-09-25 DIAGNOSIS — E559 Vitamin D deficiency, unspecified: Secondary | ICD-10-CM | POA: Insufficient documentation

## 2015-09-25 LAB — VITAMIN D 25 HYDROXY (VIT D DEFICIENCY, FRACTURES): VIT D 25 HYDROXY: 20 ng/mL — AB (ref 30–100)

## 2015-09-29 MED ORDER — DAPAGLIFLOZIN PROPANEDIOL 5 MG PO TABS: 5.0000 mg | ORAL_TABLET | Freq: Every day | ORAL | Status: DC

## 2015-09-29 NOTE — Addendum Note (Signed)
Addended by: Fara Chute on: 09/29/2015 04:38 PM   Modules accepted: Orders

## 2015-10-25 ENCOUNTER — Other Ambulatory Visit: Payer: Self-pay | Admitting: Physician Assistant

## 2015-11-19 ENCOUNTER — Other Ambulatory Visit: Payer: Self-pay

## 2015-11-19 DIAGNOSIS — N809 Endometriosis, unspecified: Secondary | ICD-10-CM

## 2015-11-19 DIAGNOSIS — G4700 Insomnia, unspecified: Secondary | ICD-10-CM

## 2015-11-19 NOTE — Telephone Encounter (Signed)
Pt is needing to go out town due to a death in family and is needing the valium and vicodin  Best number [phone]

## 2015-11-20 MED ORDER — HYDROCODONE-ACETAMINOPHEN 5-325 MG PO TABS: 1.0000 | ORAL_TABLET | Freq: Three times a day (TID) | ORAL | Status: DC | PRN

## 2015-11-20 MED ORDER — DIAZEPAM 2 MG PO TABS: 2.0000 mg | ORAL_TABLET | Freq: Three times a day (TID) | ORAL | Status: DC | PRN

## 2015-11-20 NOTE — Telephone Encounter (Signed)
Done

## 2015-11-20 NOTE — Telephone Encounter (Signed)
Notified pt ready. 

## 2015-11-25 ENCOUNTER — Other Ambulatory Visit: Payer: Self-pay | Admitting: Physician Assistant

## 2015-12-09 ENCOUNTER — Other Ambulatory Visit: Payer: Self-pay | Admitting: Physician Assistant

## 2016-01-07 ENCOUNTER — Telehealth: Payer: Self-pay

## 2016-01-07 MED ORDER — LIRAGLUTIDE 18 MG/3ML ~~LOC~~ SOPN: PEN_INJECTOR | SUBCUTANEOUS | Status: DC

## 2016-01-07 NOTE — Telephone Encounter (Signed)
PATIENT WOULD LIKE SARAH TO KNOW THAT SHE NEEDS A REFILL ON HER VICTOZA 18 MG. SHE SAID THE PHARMACIST TOLD HER TO TELL SARAH TO BE SURE AND PUT - NEEDS 3 PENS. SHE WILL COME IN TO SEE SARAH ON SAT. 01/17/16 OR SUN. 01/18/16. BEST PHONE [phone]  PHARMACY CHOICE IS [facility] [place], [place]   [place]

## 2016-01-07 NOTE — Telephone Encounter (Signed)
Rx sent 

## 2016-01-28 ENCOUNTER — Emergency Department (HOSPITAL_BASED_OUTPATIENT_CLINIC_OR_DEPARTMENT_OTHER)
Admission: EM | Admit: 2016-01-28 | Discharge: 2016-01-29 | Disposition: A | Discharge status: Home / Self Care | Payer: BLUE CROSS/BLUE SHIELD | Attending: Emergency Medicine | Admitting: Emergency Medicine

## 2016-01-28 ENCOUNTER — Emergency Department (HOSPITAL_BASED_OUTPATIENT_CLINIC_OR_DEPARTMENT_OTHER): Payer: BLUE CROSS/BLUE SHIELD

## 2016-01-28 ENCOUNTER — Encounter (HOSPITAL_BASED_OUTPATIENT_CLINIC_OR_DEPARTMENT_OTHER): Payer: Self-pay

## 2016-01-28 DIAGNOSIS — Z79899 Other long term (current) drug therapy: Secondary | ICD-10-CM | POA: Insufficient documentation

## 2016-01-28 DIAGNOSIS — E119 Type 2 diabetes mellitus without complications: Secondary | ICD-10-CM | POA: Insufficient documentation

## 2016-01-28 DIAGNOSIS — F17210 Nicotine dependence, cigarettes, uncomplicated: Secondary | ICD-10-CM | POA: Insufficient documentation

## 2016-01-28 DIAGNOSIS — I10 Essential (primary) hypertension: Secondary | ICD-10-CM | POA: Insufficient documentation

## 2016-01-28 DIAGNOSIS — M1990 Unspecified osteoarthritis, unspecified site: Secondary | ICD-10-CM | POA: Diagnosis not present

## 2016-01-28 DIAGNOSIS — R51 Headache: Secondary | ICD-10-CM | POA: Insufficient documentation

## 2016-01-28 DIAGNOSIS — R519 Headache, unspecified: Secondary | ICD-10-CM

## 2016-01-28 DIAGNOSIS — Z7984 Long term (current) use of oral hypoglycemic drugs: Secondary | ICD-10-CM | POA: Insufficient documentation

## 2016-01-28 DIAGNOSIS — Z794 Long term (current) use of insulin: Secondary | ICD-10-CM | POA: Diagnosis not present

## 2016-01-28 HISTORY — DX: Migraine, unspecified, not intractable, without status migrainosus: G43.909

## 2016-01-28 LAB — PREGNANCY, URINE: Preg Test, Ur: NEGATIVE

## 2016-01-28 MED ORDER — METOCLOPRAMIDE HCL 5 MG/ML IJ SOLN
10.0000 mg | Freq: Once | INTRAMUSCULAR | Status: AC
  Administered 2016-01-28: 10 mg via INTRAVENOUS
  Filled 2016-01-28: qty 2

## 2016-01-28 MED ORDER — DIPHENHYDRAMINE HCL 50 MG/ML IJ SOLN
25.0000 mg | Freq: Once | INTRAMUSCULAR | Status: AC
  Administered 2016-01-28: 25 mg via INTRAVENOUS
  Filled 2016-01-28: qty 1

## 2016-01-28 MED ORDER — KETOROLAC TROMETHAMINE 30 MG/ML IJ SOLN
30.0000 mg | Freq: Once | INTRAMUSCULAR | Status: AC
  Administered 2016-01-28: 30 mg via INTRAVENOUS
  Filled 2016-01-28: qty 1

## 2016-01-28 MED ORDER — SODIUM CHLORIDE 0.9 % IV BOLUS (SEPSIS)
1000.0000 mL | Freq: Once | INTRAVENOUS | Status: AC
  Administered 2016-01-28: 1000 mL via INTRAVENOUS

## 2016-01-28 MED ORDER — HYDROMORPHONE HCL 1 MG/ML IJ SOLN
1.0000 mg | Freq: Once | INTRAMUSCULAR | Status: AC
  Administered 2016-01-28: 1 mg via INTRAVENOUS
  Filled 2016-01-28: qty 1

## 2016-01-28 NOTE — ED Notes (Signed)
Pt states she has a hx of migraines, but has not had one in awhile because she has been avoiding the trigger foods. Today c/o pain to right eye and back of head. Sisters x 2 died of aneurysms-also her father. PERRL. Neuro WNL.

## 2016-01-28 NOTE — ED Notes (Signed)
C/o HA, posterior neck pain x 2 hours-denies injury-NAD-steady gait

## 2016-01-28 NOTE — ED Notes (Signed)
Patient walked with room 2 with no difficulty Patient asked to undress and place gown on opening in the back.

## 2016-01-28 NOTE — ED Notes (Signed)
PA at bedside.

## 2016-01-28 NOTE — ED Provider Notes (Signed)
CSN: [id number]     Arrival date & time 01/28/16  1615 History   First MD Initiated Contact with Patient 01/28/16 1643     Chief Complaint  Patient presents with  . Headache     (Consider location/radiation/quality/duration/timing/severity/associated sxs/prior Treatment) HPI   50 year old morbidly obese female with history of migraine, diabetes, prior Lyme disease, hypertension presenting with complaints of headache. Pt report a gradual onset of right-sided headache throughout the day today. She describes headache as a throbbing sensation behind her right eye with associated phonophobia and photophobia along with nausea. Headache feels similar to prior headaches that she has in the past. She did try taking ibuprofen 800 mg with minimal relief. She felt that darkness and being a quiet room will help with her headache. She release headache may be related to the start of her menses, which can sometimes triggers in the past. Last menstrual period was 01/03/2016. Her husband was concerned due to strong family history of aneurysm and recommend patient to come to ER for further evaluation. Patient however felt headache is more likely to be recurrent migraine headache. She reportedly had a migraine journal in the past and did identify several trigger factors that should be to headache. She has not had a headache in several years. Patient denies having any fever, chills, URI symptoms, diplopia, scintillating scotoma, neck stiffness, chest pain, focal numbness or weakness, confusion, vomiting, diarrhea, or rash.   Past Medical History  Diagnosis Date  . Diabetes mellitus   . Endometriosis   . Hypertension   . Morbid obesity ([place])   . Arthritis   . Anxiety   . Obstructive sleep apnea     CPAP NIGHTLY; sleep study 2007.  Marland Kitchen Renal disorder     kidney stone  . Hypersomnia, persistent     epworth 16   . Obesity, Class III, BMI 40-49.9 (morbid obesity) ([place])   . Lyme borreliosis   . Migraine    Past  Surgical History  Procedure Laterality Date  . Cholecystectomy    . Knee surgery    . Knee arthrocentesis    . Tubal ligation    . Ablation on endometriosis    . Ablation on endometriosis      10 years ago   Family History  Problem Relation Age of Onset  . Diabetes Mother   . Thyroid disease Mother   . Depression Mother     with anxiety  . Stroke Mother   . Diabetes Father   . Hypertension Father   . Diabetes Sister   . Hypertension Sister   . Hypertension Brother    Social History  Substance Use Topics  . Smoking status: Current Every Day Smoker -- 0.20 packs/day for 15 years    Types: Cigarettes  . Smokeless tobacco: Never Used  . Alcohol Use: No   OB History    No data available     Review of Systems  All other systems reviewed and are negative.     Allergies  Darvocet; Lisinopril; Metformin and related; Morphine and related; Percocet; Wellbutrin; and Zofran  Home Medications   Prior to Admission medications   Medication Sig Start Date End Date Taking? Authorizing Provider  cyclobenzaprine (FLEXERIL) 5 MG tablet Take 1 tablet (5 mg total) by mouth 3 (three) times daily as needed for muscle spasms. 09/24/15   Mancel Bale, PA-C  diazepam (VALIUM) 2 MG tablet Take 1-2 tablets (2-4 mg total) by mouth every 8 (eight) hours as needed for anxiety  or muscle spasms. 11/20/15   Mancel Bale, PA-C  FLUoxetine (PROZAC) 20 MG capsule Take 1 capsule (20 mg total) by mouth 3 (three) times daily. 09/24/15   Mancel Bale, PA-C  furosemide (LASIX) 20 MG tablet Take 0.5-1 tablets (10-20 mg total) by mouth daily. 09/24/15   Mancel Bale, PA-C  glipiZIDE (GLUCOTROL) 10 MG tablet Take 1 tablet (10 mg total) by mouth 2 (two) times daily before a meal. 09/24/15   Mancel Bale, PA-C  glucose blood test strip 1 each by Other route 2 (two) times daily. Use as instructed 03/26/15   Mancel Bale, PA-C  ibuprofen (ADVIL,MOTRIN) 800 MG tablet Take 1 tablet (800 mg total) by mouth every  8 (eight) hours as needed. 12/08/14   Mancel Bale, PA-C  Insulin Pen Needle 32G X 5 MM MISC 1 application by Does not apply route daily. 09/24/15   Mancel Bale, PA-C  Liraglutide (VICTOZA) 18 MG/3ML SOPN INJECT 0.3MLS SUBCUTANEOUSLY DAILY, OFFICE VISIT NEEDED 01/07/16   Mancel Bale, PA-C  metoprolol tartrate (LOPRESSOR) 25 MG tablet Take 1 tablet (25 mg total) by mouth 2 (two) times daily. 09/24/15   Mancel Bale, PA-C  mometasone (NASONEX) 50 MCG/ACT nasal spray Place 2 sprays into the nose daily. Patient not taking: Reported on 09/24/2015 03/26/15   Mancel Bale, PA-C  rosuvastatin (CRESTOR) 40 MG tablet Take 1 tablet (40 mg total) by mouth daily. 09/24/15   Mancel Bale, PA-C   BP 168/104 mmHg  Pulse 73  Temp(Src) 98.8 F (37.1 C) (Oral)  Resp 18  Ht 5\' 1"  (1.549 m)  Wt 113.399 kg  BMI 47.26 kg/m2  SpO2 99%  LMP 01/03/2016 Physical Exam  Constitutional: She is oriented to person, place, and time. She appears well-developed and well-nourished. No distress.  HENT:  Head: Atraumatic.  Right Ear: External ear normal.  Left Ear: External ear normal.  Mouth/Throat: Oropharynx is clear and moist.  Eyes: Conjunctivae and EOM are normal. Pupils are equal, round, and reactive to light.  Neck: Normal range of motion. Neck supple.  No nuchal rigidity  Musculoskeletal:  5 out 5 strength to all 4 extremities.  Neurological: She is alert and oriented to person, place, and time. No cranial nerve deficit or sensory deficit. She displays a negative Romberg sign. Coordination and gait normal. GCS eye subscore is 4. GCS verbal subscore is 5. GCS motor subscore is 6.  Normal gait.    Skin: No rash noted.  Large lipoma noted to left neck and shoulder mildly tender but no signs of infection.  Psychiatric: She has a normal mood and affect.  Nursing note and vitals reviewed.   ED Course  Procedures (including critical care time)   MDM   Final diagnoses:  Bad headache    BP 158/78 mmHg   Pulse 70  Temp(Src) 98.1 F (36.7 C) (Oral)  Resp 18  Ht 5\' 1"  (1.549 m)  Wt 113.399 kg  BMI 47.26 kg/m2  SpO2 96%  LMP 01/03/2016   Headache similar to previous, no fever, neck stiffness, neuro findings or new symptoms to suggest more serious etiology.  I don't think SAH, ICH, meningitis, encephalitis, mass at this time.  No recent trauma.  I don't feel imaging necessary at this time.  Plan to control symptoms.   11:28 PM Improvement of sxs after migraine cocktail.  Pt stable for discharge.  Return precaution discussed.   Domenic Moras, PA-C 01/28/16 2328  Sherwood Gambler, MD  01/30/16 0036 

## 2016-01-28 NOTE — Discharge Instructions (Signed)
Migraine Headache  A migraine headache is very bad, throbbing pain on one or both sides of your head. Talk to your doctor about what things may bring on (trigger) your migraine headaches.  HOME CARE  · Only take medicines as told by your doctor.  · Lie down in a dark, quiet room when you have a migraine.  · Keep a journal to find out if certain things bring on migraine headaches. For example, write down:    What you eat and drink.    How much sleep you get.    Any change to your diet or medicines.  · Lessen how much alcohol you drink.  · Quit smoking if you smoke.  · Get enough sleep.  · Lessen any stress in your life.  · Keep lights dim if bright lights bother you or make your migraines worse.  GET HELP RIGHT AWAY IF:   · Your migraine becomes really bad.  · You have a fever.  · You have a stiff neck.  · You have trouble seeing.  · Your muscles are weak, or you lose muscle control.  · You lose your balance or have trouble walking.  · You feel like you will pass out (faint), or you pass out.  · You have really bad symptoms that are different than your first symptoms.  MAKE SURE YOU:   · Understand these instructions.  · Will watch your condition.  · Will get help right away if you are not doing well or get worse.     This information is not intended to replace advice given to you by your health care provider. Make sure you discuss any questions you have with your health care provider.     Document Released: 06/22/2008 Document Revised: 12/06/2011 Document Reviewed: 05/21/2013  Elsevier Interactive Patient Education ©2016 Elsevier Inc[employer]

## 2016-02-02 ENCOUNTER — Ambulatory Visit (INDEPENDENT_AMBULATORY_CARE_PROVIDER_SITE_OTHER): Payer: BLUE CROSS/BLUE SHIELD | Admitting: Physician Assistant

## 2016-02-02 VITALS — BP 140/82 | HR 66 | Temp 98.3°F | Resp 18 | Ht 61.0 in | Wt 250.0 lb

## 2016-02-02 DIAGNOSIS — F430 Acute stress reaction: Secondary | ICD-10-CM

## 2016-02-02 DIAGNOSIS — F329 Major depressive disorder, single episode, unspecified: Secondary | ICD-10-CM

## 2016-02-02 DIAGNOSIS — J302 Other seasonal allergic rhinitis: Secondary | ICD-10-CM

## 2016-02-02 DIAGNOSIS — E7800 Pure hypercholesterolemia, unspecified: Secondary | ICD-10-CM | POA: Diagnosis not present

## 2016-02-02 DIAGNOSIS — E119 Type 2 diabetes mellitus without complications: Secondary | ICD-10-CM

## 2016-02-02 DIAGNOSIS — I10 Essential (primary) hypertension: Secondary | ICD-10-CM | POA: Diagnosis not present

## 2016-02-02 DIAGNOSIS — J019 Acute sinusitis, unspecified: Secondary | ICD-10-CM

## 2016-02-02 DIAGNOSIS — N809 Endometriosis, unspecified: Secondary | ICD-10-CM | POA: Diagnosis not present

## 2016-02-02 DIAGNOSIS — R600 Localized edema: Secondary | ICD-10-CM

## 2016-02-02 DIAGNOSIS — G4700 Insomnia, unspecified: Secondary | ICD-10-CM | POA: Diagnosis not present

## 2016-02-02 DIAGNOSIS — S39012A Strain of muscle, fascia and tendon of lower back, initial encounter: Secondary | ICD-10-CM

## 2016-02-02 DIAGNOSIS — F32A Depression, unspecified: Secondary | ICD-10-CM

## 2016-02-02 LAB — COMPLETE METABOLIC PANEL WITH GFR
ALT: 27 U/L (ref 6–29)
AST: 14 U/L (ref 10–35)
Albumin: 4 g/dL (ref 3.6–5.1)
Alkaline Phosphatase: 82 U/L (ref 33–115)
BILIRUBIN TOTAL: 0.3 mg/dL (ref 0.2–1.2)
BUN: 18 mg/dL (ref 7–25)
CO2: 25 mmol/L (ref 20–31)
Calcium: 9.5 mg/dL (ref 8.6–10.2)
Chloride: 101 mmol/L (ref 98–110)
Creat: 0.64 mg/dL (ref 0.50–1.10)
GFR, Est African American: >89 mL/min (ref >=60)
GLUCOSE: 167 mg/dL — AB (ref 65–99)
POTASSIUM: 4.3 mmol/L (ref 3.5–5.3)
SODIUM: 137 mmol/L (ref 135–146)
TOTAL PROTEIN: 6.7 g/dL (ref 6.1–8.1)

## 2016-02-02 LAB — LIPID PANEL
CHOL/HDL RATIO: 8.5 ratio — AB (ref <=5.0)
Cholesterol: 281 mg/dL — ABNORMAL HIGH (ref 125–200)
HDL: 33 mg/dL — AB (ref >=46)
TRIGLYCERIDES: 502 mg/dL — AB (ref <150)

## 2016-02-02 LAB — HEMOGLOBIN A1C
HEMOGLOBIN A1C: 9.3 % — AB (ref <5.7)
Mean Plasma Glucose: 220 mg/dL

## 2016-02-02 MED ORDER — DIAZEPAM 2 MG PO TABS: 2.0000 mg | ORAL_TABLET | Freq: Three times a day (TID) | ORAL | Status: DC | PRN

## 2016-02-02 MED ORDER — HYDROCODONE-ACETAMINOPHEN 5-325 MG PO TABS: 1.0000 | ORAL_TABLET | Freq: Four times a day (QID) | ORAL | Status: DC | PRN

## 2016-02-02 MED ORDER — FLUOXETINE HCL 20 MG PO CAPS: 20.0000 mg | ORAL_CAPSULE | Freq: Three times a day (TID) | ORAL | Status: DC

## 2016-02-02 MED ORDER — INSULIN PEN NEEDLE 32G X 5 MM MISC: 1.0000 "application " | Freq: Every day | Status: DC

## 2016-02-02 MED ORDER — GLIPIZIDE 10 MG PO TABS: 10.0000 mg | ORAL_TABLET | Freq: Two times a day (BID) | ORAL | Status: DC

## 2016-02-02 MED ORDER — CYCLOBENZAPRINE HCL 5 MG PO TABS: 5.0000 mg | ORAL_TABLET | Freq: Three times a day (TID) | ORAL | Status: DC | PRN

## 2016-02-02 MED ORDER — AMOXICILLIN 875 MG PO TABS: 875.0000 mg | ORAL_TABLET | Freq: Two times a day (BID) | ORAL | Status: DC

## 2016-02-02 MED ORDER — IBUPROFEN 800 MG PO TABS: 800.0000 mg | ORAL_TABLET | Freq: Three times a day (TID) | ORAL | Status: DC | PRN

## 2016-02-02 MED ORDER — METOPROLOL TARTRATE 50 MG PO TABS: 50.0000 mg | ORAL_TABLET | Freq: Two times a day (BID) | ORAL | Status: DC

## 2016-02-02 MED ORDER — FUROSEMIDE 20 MG PO TABS: 10.0000 mg | ORAL_TABLET | Freq: Every day | ORAL | Status: DC

## 2016-02-02 MED ORDER — LIRAGLUTIDE 18 MG/3ML ~~LOC~~ SOPN: PEN_INJECTOR | SUBCUTANEOUS | Status: DC

## 2016-02-02 MED ORDER — ROSUVASTATIN CALCIUM 40 MG PO TABS: 40.0000 mg | ORAL_TABLET | Freq: Every day | ORAL | Status: DC

## 2016-02-02 NOTE — Progress Notes (Signed)
[name]  MRN: [id number] DOB: October 02, 1965  Subjective:  Pt presents to clinic for medication refills.  Her sister died unexpectedly last month and she has been eating poorly from the stress - she is worried about her diabetes control - she wants to lose weight and she knows what she has to do but she mentally is not there currently.  She has started exercising.  She has had sinus pressure recently for about 2 weeks and she thinks she has another sinus infection.  She has been out of her pain medication for her endometriosis.  She uses her CPAP machine nightly.   Patient Active Problem List   Diagnosis Date Noted  . Vitamin D insufficiency 09/25/2015  . Endometriosis 01/17/2014  . Sleep apnea with use of continuous positive airway pressure (CPAP) 06/27/2013  . Nocturia 06/27/2013  . Obesity, Class III, BMI 40-49.9 (morbid obesity) ([place])   . Obstructive sleep apnea (adult) (pediatric) 02/15/2013  . Hypersomnia, persistent   . HTN (hypertension) 02/03/2013  . DM (diabetes mellitus), type 2 ([place]) 02/03/2013    Current Outpatient Prescriptions on File Prior to Visit  Medication Sig Dispense Refill  . glucose blood test strip 1 each by Other route 2 (two) times daily. Use as instructed 100 each 12  . [DISCONTINUED] Ranitidine HCl (ZANTAC PO) Take 1 tablet by mouth 2 (two) times daily. Patient uses this medication for heartburn.     No current facility-administered medications on file prior to visit.    Allergies  Allergen Reactions  . Darvocet [Propoxyphene N-Acetaminophen] Hives and Itching  . Lisinopril Cough  . Metformin And Related Other (See Comments)    Chest pain  . Morphine And Related Nausea And Vomiting  . Percocet [Oxycodone-Acetaminophen] Itching    And headache  . Wellbutrin [Bupropion Hcl]     Hears voices  . Zofran [Ondansetron Hcl] Itching    Review of Systems Objective:  BP 140/82 mmHg  Pulse 66  Temp(Src) 98.3 F (36.8 C) (Oral)  Resp 18  Ht 5\' 1"   (1.549 m)  Wt 250 lb (113.399 kg)  BMI 47.26 kg/m2  SpO2 97%  LMP 01/03/2016  Wt Readings from Last 3 Encounters:  02/02/16 250 lb (113.399 kg)  01/28/16 250 lb (113.399 kg)  09/24/15 250 lb (113.399 kg)    Physical Exam  Constitutional: She is oriented to person, place, and time and well-developed, well-nourished, and in no distress.  HENT:  Head: Normocephalic and atraumatic.  Right Ear: External ear normal.  Left Ear: External ear normal.  Eyes: Conjunctivae are normal.  Cardiovascular: Normal rate, regular rhythm and normal heart sounds.   No murmur heard. Pulmonary/Chest: Effort normal and breath sounds normal.  Neurological: She is alert and oriented to person, place, and time. Gait normal.  Skin: Skin is warm and dry.  Psychiatric: Mood, memory, affect and judgment normal.  Vitals reviewed.    Assessment and Plan :  Type 2 diabetes mellitus without complication, without long-term current use of insulin (HCC) - Plan: COMPLETE METABOLIC PANEL WITH GFR, Hemoglobin A1c, Insulin Pen Needle 32G X 5 MM MISC, glipiZIDE (GLUCOTROL) 10 MG tablet, Liraglutide (VICTOZA) 18 MG/3ML SOPN, DISCONTINUED: Liraglutide (VICTOZA) 18 MG/3ML SOPN, CANCELED: Microalbumin, urine  Bilateral leg edema - Plan: furosemide (LASIX) 20 MG tablet  Seasonal allergies  Insomnia - Plan: diazepam (VALIUM) 2 MG tablet  Depression - Plan: FLUoxetine (PROZAC) 20 MG capsule  Stress reaction - Plan: FLUoxetine (PROZAC) 20 MG capsule  Essential hypertension - Plan: metoprolol tartrate (LOPRESSOR)  50 MG tablet  Pure hypercholesterolemia - Plan: Lipid panel, rosuvastatin (CRESTOR) 40 MG tablet  Lumbar strain, initial encounter - Plan: cyclobenzaprine (FLEXERIL) 5 MG tablet, ibuprofen (ADVIL,MOTRIN) 800 MG tablet  Obesity, Class III, BMI 40-49.9 (morbid obesity) (HCC)  Endometriosis - Plan: HYDROcodone-acetaminophen (NORCO/VICODIN) 5-325 MG tablet  Acute sinusitis, recurrence not specified, unspecified  location - Plan: amoxicillin (AMOXIL) 875 MG tablet   Treat acute sinus infection.  Refilled all her medications - she thinks she is ok with going on insulin at this point if her DM is not controlled.  She has not yet increased her depression medications - she has just not been ready but she wants to soon.  Encouraged her to return to her healthier eating habits to get back on her weight loss - Encouraged eye exam.  Recheck in at least 3 months.  Windell Hummingbird PA-C  [facility] 02/06/2016 5:09 PM

## 2016-02-02 NOTE — Patient Instructions (Signed)
     IF you received an x-ray today, you will receive an invoice from [facility]. Please contact [facility] at [phone] with questions or concerns regarding your invoice.   IF you received labwork today, you will receive an invoice from [facility]. Please contact [facility] at [phone] with questions or concerns regarding your invoice.   Our billing staff will not be able to assist you with questions regarding bills from these companies.  You will be contacted with the lab results as soon as they are available. The fastest way to get your results is to activate your My Chart account. Instructions are located on the last page of this paperwork. If you have not heard from us regarding the results in 2 weeks, please contact this office.      

## 2016-02-10 ENCOUNTER — Telehealth: Payer: Self-pay

## 2016-02-10 NOTE — Telephone Encounter (Signed)
I called pt and advised of lab results. Pt is okay with starting insulin. She is also okay starting without an OV. I advised pt once she starts taking insulin she will stop glipizide.   Pt also wanted to advise you of recent BP readings. Yesterday her BP at 12:34pm was 172/94 and at 2:48pm it was 163/92. This morning at 9:16 her BP was 162/93 and at 10:10am it was 203/108 then it came down to 179/97. Pt said she was taking two of the 25mg  metoprolol tablets. Today she started taking one 50mg  metoprolol tablet.  Pt wants to monitor her BP was another two weeks before deciding on changing medication.

## 2016-02-11 MED ORDER — INSULIN GLARGINE 100 UNIT/ML SOLOSTAR PEN: PEN_INJECTOR | SUBCUTANEOUS | Status: DC

## 2016-02-12 ENCOUNTER — Emergency Department (HOSPITAL_BASED_OUTPATIENT_CLINIC_OR_DEPARTMENT_OTHER): Payer: BLUE CROSS/BLUE SHIELD

## 2016-02-12 ENCOUNTER — Emergency Department (HOSPITAL_BASED_OUTPATIENT_CLINIC_OR_DEPARTMENT_OTHER)
Admission: EM | Admit: 2016-02-12 | Discharge: 2016-02-12 | Disposition: A | Discharge status: Home / Self Care | Payer: BLUE CROSS/BLUE SHIELD | Attending: Emergency Medicine | Admitting: Emergency Medicine

## 2016-02-12 ENCOUNTER — Encounter (HOSPITAL_BASED_OUTPATIENT_CLINIC_OR_DEPARTMENT_OTHER): Payer: Self-pay | Admitting: *Deleted

## 2016-02-12 DIAGNOSIS — I10 Essential (primary) hypertension: Secondary | ICD-10-CM | POA: Diagnosis not present

## 2016-02-12 DIAGNOSIS — E119 Type 2 diabetes mellitus without complications: Secondary | ICD-10-CM | POA: Diagnosis not present

## 2016-02-12 DIAGNOSIS — Z6841 Body Mass Index (BMI) 40.0 and over, adult: Secondary | ICD-10-CM | POA: Insufficient documentation

## 2016-02-12 DIAGNOSIS — F17210 Nicotine dependence, cigarettes, uncomplicated: Secondary | ICD-10-CM | POA: Diagnosis not present

## 2016-02-12 DIAGNOSIS — G43909 Migraine, unspecified, not intractable, without status migrainosus: Secondary | ICD-10-CM | POA: Diagnosis present

## 2016-02-12 DIAGNOSIS — R51 Headache: Secondary | ICD-10-CM | POA: Diagnosis not present

## 2016-02-12 DIAGNOSIS — R519 Headache, unspecified: Secondary | ICD-10-CM

## 2016-02-12 DIAGNOSIS — M1990 Unspecified osteoarthritis, unspecified site: Secondary | ICD-10-CM | POA: Diagnosis not present

## 2016-02-12 DIAGNOSIS — R110 Nausea: Secondary | ICD-10-CM | POA: Diagnosis not present

## 2016-02-12 LAB — CBC WITH DIFFERENTIAL/PLATELET
BASOS PCT: 1 %
Basophils Absolute: 0.1 10*3/uL (ref 0.0–0.1)
EOS ABS: 0.2 10*3/uL (ref 0.0–0.7)
Eosinophils Relative: 2 %
HCT: 41.8 % (ref 36.0–46.0)
HEMOGLOBIN: 14.8 g/dL (ref 12.0–15.0)
Lymphocytes Relative: 35 %
Lymphs Abs: 3.6 10*3/uL (ref 0.7–4.0)
MCH: 31 pg (ref 26.0–34.0)
MCHC: 35.4 g/dL (ref 30.0–36.0)
MCV: 87.6 fL (ref 78.0–100.0)
Monocytes Absolute: 0.7 10*3/uL (ref 0.1–1.0)
Monocytes Relative: 6 %
NEUTROS PCT: 56 %
Neutro Abs: 5.8 10*3/uL (ref 1.7–7.7)
Platelets: 288 10*3/uL (ref 150–400)
RBC: 4.77 MIL/uL (ref 3.87–5.11)
RDW: 13.9 % (ref 11.5–15.5)
WBC: 10.4 10*3/uL (ref 4.0–10.5)

## 2016-02-12 LAB — BASIC METABOLIC PANEL
ANION GAP: 8 (ref 5–15)
BUN: 15 mg/dL (ref 6–20)
CHLORIDE: 103 mmol/L (ref 101–111)
CO2: 23 mmol/L (ref 22–32)
Calcium: 9.2 mg/dL (ref 8.9–10.3)
Creatinine, Ser: 0.69 mg/dL (ref 0.44–1.00)
GFR calc non Af Amer: >60 mL/min (ref >60)
Glucose, Bld: 224 mg/dL — ABNORMAL HIGH (ref 65–99)
Potassium: 3.7 mmol/L (ref 3.5–5.1)
SODIUM: 134 mmol/L — AB (ref 135–145)

## 2016-02-12 MED ORDER — DIPHENHYDRAMINE HCL 50 MG/ML IJ SOLN
12.5000 mg | Freq: Once | INTRAMUSCULAR | Status: AC
  Administered 2016-02-12: 12.5 mg via INTRAVENOUS
  Filled 2016-02-12: qty 1

## 2016-02-12 MED ORDER — METOCLOPRAMIDE HCL 5 MG/ML IJ SOLN
10.0000 mg | Freq: Once | INTRAMUSCULAR | Status: AC
  Administered 2016-02-12: 10 mg via INTRAVENOUS
  Filled 2016-02-12: qty 2

## 2016-02-12 MED ORDER — KETOROLAC TROMETHAMINE 30 MG/ML IJ SOLN
30.0000 mg | Freq: Once | INTRAMUSCULAR | Status: AC
  Administered 2016-02-12: 30 mg via INTRAVENOUS
  Filled 2016-02-12: qty 1

## 2016-02-12 MED ORDER — FENTANYL CITRATE (PF) 100 MCG/2ML IJ SOLN
50.0000 ug | Freq: Once | INTRAMUSCULAR | Status: AC
  Administered 2016-02-12: 50 ug via INTRAVENOUS
  Filled 2016-02-12: qty 2

## 2016-02-12 MED ORDER — SODIUM CHLORIDE 0.9 % IV BOLUS (SEPSIS)
1000.0000 mL | Freq: Once | INTRAVENOUS | Status: AC
  Administered 2016-02-12: 1000 mL via INTRAVENOUS

## 2016-02-12 NOTE — Discharge Instructions (Signed)

## 2016-02-12 NOTE — ED Provider Notes (Signed)
CSN: [id number]     Arrival date & time 02/12/16  1315 History   First MD Initiated Contact with Patient 02/12/16 1402     Chief Complaint  Patient presents with  . Migraine     (Consider location/radiation/quality/duration/timing/severity/associated sxs/prior Treatment) Patient is a 50 y.o. female presenting with migraines. The history is provided by the patient. No language interpreter was used.  Migraine This is a recurrent problem. The current episode started today. The problem occurs constantly. The problem has been gradually worsening. Associated symptoms include headaches and nausea. Pertinent negatives include no fever. Nothing aggravates the symptoms. The treatment provided moderate relief.  Pt complains of a headache.  Pt has a history of migraines.  Pt reports she had not had for several years until recently. Pt reports recent poor health.  Elevated A1c  And hypertension.  Pt reports her provider recently changed bp medication and is planning on switching her to insulin  Past Medical History  Diagnosis Date  . Diabetes mellitus   . Endometriosis   . Hypertension   . Morbid obesity ([place])   . Arthritis   . Anxiety   . Obstructive sleep apnea     CPAP NIGHTLY; sleep study 2007.  Marland Kitchen Renal disorder     kidney stone  . Hypersomnia, persistent     epworth 16   . Obesity, Class III, BMI 40-49.9 (morbid obesity) ([place])   . Lyme borreliosis   . Migraine    Past Surgical History  Procedure Laterality Date  . Cholecystectomy    . Knee surgery    . Knee arthrocentesis    . Tubal ligation    . Ablation on endometriosis    . Ablation on endometriosis      10 years ago   Family History  Problem Relation Age of Onset  . Diabetes Mother   . Thyroid disease Mother   . Depression Mother     with anxiety  . Stroke Mother   . Diabetes Father   . Hypertension Father   . Diabetes Sister   . Hypertension Sister   . Hypertension Brother    Social History  Substance Use Topics   . Smoking status: Current Every Day Smoker -- 0.20 packs/day for 15 years    Types: Cigarettes  . Smokeless tobacco: Never Used  . Alcohol Use: No   OB History    No data available     Review of Systems  Constitutional: Negative for fever.  Gastrointestinal: Positive for nausea.  Neurological: Positive for headaches.  All other systems reviewed and are negative.     Allergies  Darvocet; Lisinopril; Metformin and related; Morphine and related; Percocet; Wellbutrin; and Zofran  Home Medications   Prior to Admission medications   Medication Sig Start Date End Date Taking? Authorizing Provider  amoxicillin (AMOXIL) 875 MG tablet Take 1 tablet (875 mg total) by mouth 2 (two) times daily. 02/02/16   Mancel Bale, PA-C  cyclobenzaprine (FLEXERIL) 5 MG tablet Take 1 tablet (5 mg total) by mouth 3 (three) times daily as needed for muscle spasms. 02/02/16   Mancel Bale, PA-C  diazepam (VALIUM) 2 MG tablet Take 1-2 tablets (2-4 mg total) by mouth every 8 (eight) hours as needed for anxiety or muscle spasms. 02/02/16   Mancel Bale, PA-C  FLUoxetine (PROZAC) 20 MG capsule Take 1 capsule (20 mg total) by mouth 3 (three) times daily. 02/02/16   Mancel Bale, PA-C  furosemide (LASIX) 20 MG tablet Take 0.5-1  tablets (10-20 mg total) by mouth daily. 02/02/16   Mancel Bale, PA-C  glipiZIDE (GLUCOTROL) 10 MG tablet Take 1 tablet (10 mg total) by mouth 2 (two) times daily before a meal. 02/02/16   Mancel Bale, PA-C  glucose blood test strip 1 each by Other route 2 (two) times daily. Use as instructed 03/26/15   Mancel Bale, PA-C  HYDROcodone-acetaminophen (NORCO/VICODIN) 5-325 MG tablet Take 1 tablet by mouth every 6 (six) hours as needed for moderate pain. 02/02/16   Mancel Bale, PA-C  ibuprofen (ADVIL,MOTRIN) 800 MG tablet Take 1 tablet (800 mg total) by mouth every 8 (eight) hours as needed. 02/02/16   Mancel Bale, PA-C  Insulin Glargine (LANTUS SOLOSTAR) 100 UNIT/ML Solostar Pen Start with 20U qhs  - titrate by 2 Units every 4 days until am glucose is 120 or you reach 50 U qhs whichever is 1st 02/11/16   Mancel Bale, PA-C  Insulin Pen Needle 32G X 5 MM MISC 1 application by Does not apply route daily. 02/02/16   Mancel Bale, PA-C  Liraglutide (VICTOZA) 18 MG/3ML SOPN INJECT 0.3MLS SUBCUTANEOUSLY DAILY 02/02/16   Mancel Bale, PA-C  metoprolol tartrate (LOPRESSOR) 50 MG tablet Take 1 tablet (50 mg total) by mouth 2 (two) times daily. 02/02/16   Mancel Bale, PA-C  rosuvastatin (CRESTOR) 40 MG tablet Take 1 tablet (40 mg total) by mouth daily. 02/02/16   Mancel Bale, PA-C   BP 140/64 mmHg  Pulse 65  Temp(Src) 98.4 F (36.9 C) (Oral)  Resp 18  Ht 5' 1.5" (1.562 m)  Wt 113.399 kg  BMI 46.48 kg/m2  SpO2 100%  LMP 12/29/2015 Physical Exam  Constitutional: She is oriented to person, place, and time. She appears well-developed and well-nourished.  HENT:  Head: Normocephalic and atraumatic.  Right Ear: External ear normal.  Left Ear: External ear normal.  Mouth/Throat: Oropharynx is clear and moist.  Eyes: EOM are normal. Pupils are equal, round, and reactive to light.  Neck: Normal range of motion.  Cardiovascular: Normal rate and normal heart sounds.   Pulmonary/Chest: Effort normal.  Abdominal: Soft. She exhibits no distension.  Musculoskeletal: Normal range of motion.  Neurological: She is alert and oriented to person, place, and time.  Skin: Skin is warm.  Psychiatric: She has a normal mood and affect.  Nursing note and vitals reviewed.   ED Course  Procedures (including critical care time) Labs Review Labs Reviewed  BASIC METABOLIC PANEL - Abnormal; Notable for the following:    Sodium 134 (*)    Glucose, Bld 224 (*)    All other components within normal limits  CBC WITH DIFFERENTIAL/PLATELET    Imaging Review Ct Head Wo Contrast  02/12/2016  CLINICAL DATA:  Migraine headache this morning. EXAM: CT HEAD WITHOUT CONTRAST TECHNIQUE: Contiguous axial images were  obtained from the base of the skull through the vertex without intravenous contrast. COMPARISON:  CTA of the neck 11/13/2010 FINDINGS: Brain: No evidence of acute infarction, hemorrhage, extra-axial collection, ventriculomegaly, or mass effect. Vascular: No hyperdense vessel or unexpected calcification. Skull: Negative for fracture or focal lesion. Sinuses/Orbits: No acute findings. Other: None. IMPRESSION: No acute intracranial abnormality. Electronically Signed   By: Fidela Salisbury M.D.   On: 02/12/2016 14:35   I have personally reviewed and evaluated these images and lab results as part of my medical decision-making.   EKG Interpretation None      MDM   Final diagnoses:  Headache  Pt reports no relief with migraine cocktail.  Pt reports the last tim she was here she was given another medication .     Hollace Kinnier [place], PA-C 02/15/16 0001  Julianne Rice, MD 02/17/16 1414

## 2016-02-12 NOTE — ED Notes (Signed)
Migraine headache this am. States pain starts in her back and goes up to her head.

## 2016-02-19 ENCOUNTER — Encounter (HOSPITAL_BASED_OUTPATIENT_CLINIC_OR_DEPARTMENT_OTHER): Payer: Self-pay

## 2016-02-19 ENCOUNTER — Emergency Department (HOSPITAL_BASED_OUTPATIENT_CLINIC_OR_DEPARTMENT_OTHER): Payer: BLUE CROSS/BLUE SHIELD

## 2016-02-19 ENCOUNTER — Observation Stay (HOSPITAL_BASED_OUTPATIENT_CLINIC_OR_DEPARTMENT_OTHER)
Admission: EM | Admit: 2016-02-19 | Discharge: 2016-02-20 | Disposition: A | Discharge status: Home / Self Care | Payer: BLUE CROSS/BLUE SHIELD | Attending: Internal Medicine | Admitting: Internal Medicine

## 2016-02-19 DIAGNOSIS — G4733 Obstructive sleep apnea (adult) (pediatric): Secondary | ICD-10-CM | POA: Insufficient documentation

## 2016-02-19 DIAGNOSIS — F17210 Nicotine dependence, cigarettes, uncomplicated: Secondary | ICD-10-CM | POA: Diagnosis not present

## 2016-02-19 DIAGNOSIS — F418 Other specified anxiety disorders: Secondary | ICD-10-CM | POA: Diagnosis not present

## 2016-02-19 DIAGNOSIS — Z794 Long term (current) use of insulin: Secondary | ICD-10-CM | POA: Insufficient documentation

## 2016-02-19 DIAGNOSIS — I10 Essential (primary) hypertension: Secondary | ICD-10-CM | POA: Diagnosis not present

## 2016-02-19 DIAGNOSIS — Z8249 Family history of ischemic heart disease and other diseases of the circulatory system: Secondary | ICD-10-CM | POA: Insufficient documentation

## 2016-02-19 DIAGNOSIS — I110 Hypertensive heart disease with heart failure: Secondary | ICD-10-CM | POA: Insufficient documentation

## 2016-02-19 DIAGNOSIS — D72829 Elevated white blood cell count, unspecified: Secondary | ICD-10-CM | POA: Insufficient documentation

## 2016-02-19 DIAGNOSIS — G4710 Hypersomnia, unspecified: Secondary | ICD-10-CM | POA: Insufficient documentation

## 2016-02-19 DIAGNOSIS — E119 Type 2 diabetes mellitus without complications: Secondary | ICD-10-CM

## 2016-02-19 DIAGNOSIS — R079 Chest pain, unspecified: Principal | ICD-10-CM | POA: Insufficient documentation

## 2016-02-19 DIAGNOSIS — G4730 Sleep apnea, unspecified: Secondary | ICD-10-CM | POA: Diagnosis present

## 2016-02-19 DIAGNOSIS — I509 Heart failure, unspecified: Secondary | ICD-10-CM | POA: Diagnosis not present

## 2016-02-19 DIAGNOSIS — I209 Angina pectoris, unspecified: Secondary | ICD-10-CM

## 2016-02-19 DIAGNOSIS — E785 Hyperlipidemia, unspecified: Secondary | ICD-10-CM | POA: Diagnosis not present

## 2016-02-19 DIAGNOSIS — Z79899 Other long term (current) drug therapy: Secondary | ICD-10-CM | POA: Diagnosis not present

## 2016-02-19 DIAGNOSIS — Z6841 Body Mass Index (BMI) 40.0 and over, adult: Secondary | ICD-10-CM | POA: Insufficient documentation

## 2016-02-19 LAB — BASIC METABOLIC PANEL
ANION GAP: 9 (ref 5–15)
BUN: 21 mg/dL — ABNORMAL HIGH (ref 6–20)
CALCIUM: 9.3 mg/dL (ref 8.9–10.3)
CO2: 25 mmol/L (ref 22–32)
Chloride: 103 mmol/L (ref 101–111)
Creatinine, Ser: 0.76 mg/dL (ref 0.44–1.00)
GLUCOSE: 208 mg/dL — AB (ref 65–99)
POTASSIUM: 3.9 mmol/L (ref 3.5–5.1)
Sodium: 137 mmol/L (ref 135–145)

## 2016-02-19 LAB — CBC
HEMATOCRIT: 41.4 % (ref 36.0–46.0)
HEMOGLOBIN: 14.2 g/dL (ref 12.0–15.0)
MCH: 30.6 pg (ref 26.0–34.0)
MCHC: 34.3 g/dL (ref 30.0–36.0)
MCV: 89.2 fL (ref 78.0–100.0)
Platelets: 279 10*3/uL (ref 150–400)
RBC: 4.64 MIL/uL (ref 3.87–5.11)
RDW: 14 % (ref 11.5–15.5)
WBC: 12.1 10*3/uL — AB (ref 4.0–10.5)

## 2016-02-19 LAB — TROPONIN I

## 2016-02-19 LAB — CBG MONITORING, ED: GLUCOSE-CAPILLARY: 202 mg/dL — AB (ref 65–99)

## 2016-02-19 MED ORDER — ONDANSETRON HCL 4 MG/2ML IJ SOLN
4.0000 mg | Freq: Once | INTRAMUSCULAR | Status: AC | PRN
  Administered 2016-02-19: 4 mg via INTRAVENOUS
  Filled 2016-02-19: qty 2

## 2016-02-19 NOTE — ED Notes (Signed)
Pt c/o sudden onset nausea. Protocol orders for nausea implemented.

## 2016-02-19 NOTE — ED Notes (Signed)
C/o CP x 30 min

## 2016-02-19 NOTE — ED Provider Notes (Signed)
CSN: [id number]     Arrival date & time 02/19/16  2215 History  By signing my name below, I, Irene Pap, attest that this documentation has been prepared under the direction and in the presence of Everlene Balls, MD. Electronically Signed: Irene Pap, ED Scribe. 02/19/2016. 11:23 PM.  Chief Complaint  Patient presents with  . Chest Pain   The history is provided by the patient. No language interpreter was used.  HPI Comments: [name] is a 50 y.o. Female with a hx of DM, HTN, OSA, migraine, and renal disorder who presents to the Emergency Department complaining of sudden onset "heavy" chest pain onset 2 hours ago. Pt reports associated nausea, generalized headache, sharp back and bilateral shoulder pain. Pt was at rest when the pain started. Pt has an immediate family hx of heart disease. She has not taken anything for her symptoms. She denies recent long travel, hormone therapy, fever, chills, rhinorrhea, cough, sore throat, diaphoresis, vomiting, leg swelling, leg pain, or SOB. Pt does not have a cardiologist. She denies use of blood thinners.   Past Medical History  Diagnosis Date  . Diabetes mellitus   . Endometriosis   . Hypertension   . Morbid obesity ([place])   . Arthritis   . Anxiety   . Obstructive sleep apnea     CPAP NIGHTLY; sleep study 2007.  Marland Kitchen Renal disorder     kidney stone  . Hypersomnia, persistent     epworth 16   . Obesity, Class III, BMI 40-49.9 (morbid obesity) ([place])   . Lyme borreliosis   . Migraine    Past Surgical History  Procedure Laterality Date  . Cholecystectomy    . Knee surgery    . Knee arthrocentesis    . Tubal ligation    . Ablation on endometriosis    . Ablation on endometriosis      10 years ago   Family History  Problem Relation Age of Onset  . Diabetes Mother   . Thyroid disease Mother   . Depression Mother     with anxiety  . Stroke Mother   . Diabetes Father   . Hypertension Father   . Diabetes Sister   . Hypertension  Sister   . Hypertension Brother    Social History  Substance Use Topics  . Smoking status: Current Every Day Smoker -- 0.20 packs/day for 15 years    Types: Cigarettes  . Smokeless tobacco: Never Used  . Alcohol Use: No   OB History    No data available     Review of Systems 10 Systems reviewed and all are negative for acute change except as noted in the HPI.  Allergies  Darvocet; Lisinopril; Metformin and related; Morphine and related; Percocet; Wellbutrin; and Zofran  Home Medications   Prior to Admission medications   Medication Sig Start Date End Date Taking? Authorizing Provider  cyclobenzaprine (FLEXERIL) 5 MG tablet Take 1 tablet (5 mg total) by mouth 3 (three) times daily as needed for muscle spasms. 02/02/16   Mancel Bale, PA-C  diazepam (VALIUM) 2 MG tablet Take 1-2 tablets (2-4 mg total) by mouth every 8 (eight) hours as needed for anxiety or muscle spasms. 02/02/16   Mancel Bale, PA-C  FLUoxetine (PROZAC) 20 MG capsule Take 1 capsule (20 mg total) by mouth 3 (three) times daily. 02/02/16   Mancel Bale, PA-C  furosemide (LASIX) 20 MG tablet Take 0.5-1 tablets (10-20 mg total) by mouth daily. 02/02/16   Damaris Hippo  [place], PA-C  glucose blood test strip 1 each by Other route 2 (two) times daily. Use as instructed 03/26/15   Mancel Bale, PA-C  HYDROcodone-acetaminophen (NORCO/VICODIN) 5-325 MG tablet Take 1 tablet by mouth every 6 (six) hours as needed for moderate pain. 02/02/16   Mancel Bale, PA-C  ibuprofen (ADVIL,MOTRIN) 800 MG tablet Take 1 tablet (800 mg total) by mouth every 8 (eight) hours as needed. 02/02/16   Mancel Bale, PA-C  Insulin Glargine (LANTUS SOLOSTAR) 100 UNIT/ML Solostar Pen Start with 20U qhs - titrate by 2 Units every 4 days until am glucose is 120 or you reach 50 U qhs whichever is 1st 02/11/16   Mancel Bale, PA-C  Insulin Pen Needle 32G X 5 MM MISC 1 application by Does not apply route daily. 02/02/16   Mancel Bale, PA-C  Liraglutide (VICTOZA) 18 MG/3ML  SOPN INJECT 0.3MLS SUBCUTANEOUSLY DAILY 02/02/16   Mancel Bale, PA-C  metoprolol tartrate (LOPRESSOR) 50 MG tablet Take 1 tablet (50 mg total) by mouth 2 (two) times daily. 02/02/16   Mancel Bale, PA-C  rosuvastatin (CRESTOR) 40 MG tablet Take 1 tablet (40 mg total) by mouth daily. 02/02/16   Mancel Bale, PA-C   BP 146/84 mmHg  Pulse 66  Temp(Src) 98.9 F (37.2 C) (Oral)  Resp 18  Ht 5\' 2"  (1.575 m)  Wt 248 lb (112.492 kg)  BMI 45.35 kg/m2  SpO2 98%  LMP 02/03/2016 Physical Exam  Constitutional: She is oriented to person, place, and time. She appears well-developed and well-nourished. No distress.  HENT:  Head: Normocephalic and atraumatic.  Nose: Nose normal.  Mouth/Throat: Oropharynx is clear and moist. No oropharyngeal exudate.  Eyes: Conjunctivae and EOM are normal. Pupils are equal, round, and reactive to light. No scleral icterus.  Neck: Normal range of motion. Neck supple. No JVD present. No tracheal deviation present. No thyromegaly present.  Cardiovascular: Normal rate, regular rhythm and normal heart sounds.  Exam reveals no gallop and no friction rub.   No murmur heard. Pulmonary/Chest: Effort normal and breath sounds normal. No respiratory distress. She has no wheezes. She exhibits no tenderness.  Abdominal: Soft. Bowel sounds are normal. She exhibits no distension and no mass. There is no tenderness. There is no rebound and no guarding.  Musculoskeletal: Normal range of motion. She exhibits no edema or tenderness.  Lymphadenopathy:    She has no cervical adenopathy.  Neurological: She is alert and oriented to person, place, and time. No cranial nerve deficit. She exhibits normal muscle tone.  Skin: Skin is warm and dry. No rash noted. No erythema. No pallor.  Nursing note and vitals reviewed.   ED Course  Procedures (including critical care time) DIAGNOSTIC STUDIES: Oxygen Saturation is 98% on RA, normal by my interpretation.    COORDINATION OF CARE: 11:21  PM-Discussed treatment plan which includes labs and EKG with pt at bedside and pt agreed to plan.    Labs Review Labs Reviewed  BASIC METABOLIC PANEL - Abnormal; Notable for the following:    Glucose, Bld 208 (*)    BUN 21 (*)    All other components within normal limits  CBC - Abnormal; Notable for the following:    WBC 12.1 (*)    All other components within normal limits  CBG MONITORING, ED - Abnormal; Notable for the following:    Glucose-Capillary 202 (*)    All other components within normal limits  TROPONIN I    Imaging Review  Dg Chest 2 View  02/19/2016  CLINICAL DATA:  Acute onset of generalized chest pain, radiating to the left side of the neck. Upper back pain. Initial encounter. EXAM: CHEST  2 VIEW COMPARISON:  Chest radiograph performed 10/30/2012 FINDINGS: The lungs are well-aerated and clear. There is no evidence of focal opacification, pleural effusion or pneumothorax. The heart is normal in size; the mediastinal contour is within normal limits. No acute osseous abnormalities are seen. IMPRESSION: No acute cardiopulmonary process seen. Electronically Signed   By: Garald Balding M.D.   On: 02/19/2016 23:23   I have personally reviewed and evaluated these images and lab results as part of my medical decision-making.   EKG Interpretation   Date/Time:  Thursday Feb 19 2016 22:21:13 EDT Ventricular Rate:  63 PR Interval:  160 QRS Duration: 82 QT Interval:  418 QTC Calculation: 427 R Axis:   50 Text Interpretation:  Normal sinus rhythm Normal ECG tachycardia now  resolved Confirmed by Glynn Octave [phone]) on 02/19/2016 11:06:46  PM      MDM   Final diagnoses:  None     Patient presents to the ED for chest pain.  Her story is concerning for ACS.  She states it is a pressure with radiation to shoulders.  She has HTN, HLD, DM, and CHF.  Her troponin is neg and EKG does not show ischemia.  CXR unremarkable.  I spoke with Dr. Loleta Books who accepts patient for  transfer to [place].  She was given ASA and nitro in the ED here.  I personally performed the services described in this documentation, which was scribed in my presence. The recorded information has been reviewed and is accurate.     Everlene Balls, MD 02/20/16 [phone]

## 2016-02-19 NOTE — ED Notes (Signed)
CBG 202 

## 2016-02-20 ENCOUNTER — Encounter (HOSPITAL_COMMUNITY)
Admission: EM | Disposition: A | Discharge status: Home / Self Care | Payer: Self-pay | Source: Home / Self Care | Attending: Emergency Medicine

## 2016-02-20 ENCOUNTER — Observation Stay (HOSPITAL_BASED_OUTPATIENT_CLINIC_OR_DEPARTMENT_OTHER): Payer: BLUE CROSS/BLUE SHIELD

## 2016-02-20 ENCOUNTER — Encounter (HOSPITAL_COMMUNITY): Payer: Self-pay | Admitting: Cardiovascular Disease

## 2016-02-20 DIAGNOSIS — D72829 Elevated white blood cell count, unspecified: Secondary | ICD-10-CM | POA: Diagnosis not present

## 2016-02-20 DIAGNOSIS — R079 Chest pain, unspecified: Secondary | ICD-10-CM

## 2016-02-20 DIAGNOSIS — R072 Precordial pain: Secondary | ICD-10-CM

## 2016-02-20 DIAGNOSIS — E1159 Type 2 diabetes mellitus with other circulatory complications: Secondary | ICD-10-CM

## 2016-02-20 DIAGNOSIS — E118 Type 2 diabetes mellitus with unspecified complications: Secondary | ICD-10-CM | POA: Diagnosis not present

## 2016-02-20 DIAGNOSIS — Z794 Long term (current) use of insulin: Secondary | ICD-10-CM

## 2016-02-20 DIAGNOSIS — I10 Essential (primary) hypertension: Secondary | ICD-10-CM

## 2016-02-20 DIAGNOSIS — G4733 Obstructive sleep apnea (adult) (pediatric): Secondary | ICD-10-CM

## 2016-02-20 DIAGNOSIS — I509 Heart failure, unspecified: Secondary | ICD-10-CM | POA: Diagnosis not present

## 2016-02-20 DIAGNOSIS — E119 Type 2 diabetes mellitus without complications: Secondary | ICD-10-CM | POA: Diagnosis not present

## 2016-02-20 DIAGNOSIS — I110 Hypertensive heart disease with heart failure: Secondary | ICD-10-CM | POA: Diagnosis not present

## 2016-02-20 HISTORY — PX: CARDIAC CATHETERIZATION: SHX172

## 2016-02-20 LAB — ECHOCARDIOGRAM COMPLETE
HEIGHTINCHES: 61 in
Weight: 3985.6 oz

## 2016-02-20 LAB — GLUCOSE, CAPILLARY
Glucose-Capillary: 281 mg/dL — ABNORMAL HIGH (ref 65–99)
Glucose-Capillary: 135 mg/dL — ABNORMAL HIGH (ref 65–99)
Glucose-Capillary: 171 mg/dL — ABNORMAL HIGH (ref 65–99)

## 2016-02-20 LAB — LIPASE, BLOOD: LIPASE: 127 U/L — AB (ref 11–51)

## 2016-02-20 LAB — RAPID URINE DRUG SCREEN, HOSP PERFORMED
Amphetamines: NOT DETECTED
Barbiturates: NOT DETECTED
Benzodiazepines: POSITIVE — AB
Cocaine: NOT DETECTED
Opiates: POSITIVE — AB
Tetrahydrocannabinol: NOT DETECTED

## 2016-02-20 LAB — TROPONIN I
Troponin I: <0.03 ng/mL (ref <0.031)
Troponin I: <0.03 ng/mL (ref <0.031)

## 2016-02-20 LAB — LIPID PANEL
CHOL/HDL RATIO: 4.4 ratio
CHOLESTEROL: 129 mg/dL (ref 0–200)
HDL: 29 mg/dL — AB (ref >40)
LDL Cholesterol: 30 mg/dL (ref 0–99)
TRIGLYCERIDES: 350 mg/dL — AB (ref <150)
VLDL: 70 mg/dL — ABNORMAL HIGH (ref 0–40)

## 2016-02-20 LAB — PROTIME-INR
INR: 1.05 (ref 0.00–1.49)
PROTHROMBIN TIME: 13.9 s (ref 11.6–15.2)

## 2016-02-20 LAB — URINALYSIS, ROUTINE W REFLEX MICROSCOPIC
Glucose, UA: >1000 mg/dL — AB
HGB URINE DIPSTICK: NEGATIVE
KETONES UR: NEGATIVE mg/dL
Leukocytes, UA: NEGATIVE
Nitrite: NEGATIVE
PROTEIN: NEGATIVE mg/dL
Specific Gravity, Urine: >1.046 — ABNORMAL HIGH (ref 1.005–1.030)
pH: 5 (ref 5.0–8.0)

## 2016-02-20 LAB — URINE MICROSCOPIC-ADD ON: WBC, UA: NONE SEEN WBC/hpf (ref 0–5)

## 2016-02-20 LAB — CBC
HCT: 38.6 % (ref 36.0–46.0)
Hemoglobin: 12.7 g/dL (ref 12.0–15.0)
MCH: 29.6 pg (ref 26.0–34.0)
MCHC: 32.9 g/dL (ref 30.0–36.0)
MCV: 90 fL (ref 78.0–100.0)
Platelets: 237 10*3/uL (ref 150–400)
RBC: 4.29 MIL/uL (ref 3.87–5.11)
RDW: 14.1 % (ref 11.5–15.5)
WBC: 10.1 10*3/uL (ref 4.0–10.5)

## 2016-02-20 LAB — D-DIMER, QUANTITATIVE (NOT AT ARMC)

## 2016-02-20 LAB — MRSA PCR SCREENING: MRSA by PCR: NEGATIVE

## 2016-02-20 SURGERY — LEFT HEART CATH AND CORONARY ANGIOGRAPHY

## 2016-02-20 MED ORDER — SODIUM CHLORIDE 0.9% FLUSH: 3.0000 mL | INTRAVENOUS | Status: DC | PRN

## 2016-02-20 MED ORDER — SODIUM CHLORIDE 0.9 % IV SOLN: 250.0000 mL | INTRAVENOUS | Status: DC | PRN

## 2016-02-20 MED ORDER — FUROSEMIDE 20 MG PO TABS
10.0000 mg | ORAL_TABLET | Freq: Every day | ORAL | Status: DC
  Administered 2016-02-20: 10 mg via ORAL
  Filled 2016-02-20: qty 1

## 2016-02-20 MED ORDER — ACETAMINOPHEN 325 MG PO TABS: 650.0000 mg | ORAL_TABLET | ORAL | Status: DC | PRN

## 2016-02-20 MED ORDER — SODIUM CHLORIDE 0.9% FLUSH: 3.0000 mL | Freq: Two times a day (BID) | INTRAVENOUS | Status: DC

## 2016-02-20 MED ORDER — NICOTINE 21 MG/24HR TD PT24
21.0000 mg | MEDICATED_PATCH | Freq: Every day | TRANSDERMAL | Status: DC
  Administered 2016-02-20: 21 mg via TRANSDERMAL
  Filled 2016-02-20: qty 1

## 2016-02-20 MED ORDER — BUTALBITAL-APAP-CAFFEINE 50-325-40 MG PO TABS
1.0000 | ORAL_TABLET | ORAL | Status: DC | PRN
  Administered 2016-02-20 (×2): 1 via ORAL
  Filled 2016-02-20 (×2): qty 1

## 2016-02-20 MED ORDER — LIDOCAINE HCL (PF) 1 % IJ SOLN
INTRAMUSCULAR | Status: DC | PRN
  Administered 2016-02-20: 2 mL

## 2016-02-20 MED ORDER — HEPARIN (PORCINE) IN NACL 2-0.9 UNIT/ML-% IJ SOLN
INTRAMUSCULAR | Status: AC
  Filled 2016-02-20: qty 1500

## 2016-02-20 MED ORDER — HEPARIN (PORCINE) IN NACL 2-0.9 UNIT/ML-% IJ SOLN
INTRAMUSCULAR | Status: DC | PRN
  Administered 2016-02-20: 1500 mL

## 2016-02-20 MED ORDER — KETOROLAC TROMETHAMINE 30 MG/ML IJ SOLN
30.0000 mg | Freq: Once | INTRAMUSCULAR | Status: AC
  Administered 2016-02-20: 30 mg via INTRAVENOUS
  Filled 2016-02-20: qty 1

## 2016-02-20 MED ORDER — HYDROMORPHONE HCL 1 MG/ML IJ SOLN
1.0000 mg | Freq: Once | INTRAMUSCULAR | Status: AC
  Administered 2016-02-20: 1 mg via INTRAVENOUS
  Filled 2016-02-20: qty 1

## 2016-02-20 MED ORDER — IOPAMIDOL (ISOVUE-370) INJECTION 76%
INTRAVENOUS | Status: AC
  Filled 2016-02-20: qty 100

## 2016-02-20 MED ORDER — NITROGLYCERIN 0.4 MG SL SUBL
0.4000 mg | SUBLINGUAL_TABLET | SUBLINGUAL | Status: DC | PRN
  Administered 2016-02-20 (×2): 0.4 mg via SUBLINGUAL
  Filled 2016-02-20 (×2): qty 1

## 2016-02-20 MED ORDER — HEPARIN (PORCINE) IN NACL 2-0.9 UNIT/ML-% IJ SOLN
INTRAMUSCULAR | Status: AC
  Filled 2016-02-20: qty 500

## 2016-02-20 MED ORDER — VERAPAMIL HCL 2.5 MG/ML IV SOLN
INTRAVENOUS | Status: DC | PRN
  Administered 2016-02-20: 10 mL via INTRA_ARTERIAL

## 2016-02-20 MED ORDER — INSULIN ASPART 100 UNIT/ML ~~LOC~~ SOLN
0.0000 [IU] | Freq: Three times a day (TID) | SUBCUTANEOUS | Status: DC
  Administered 2016-02-20: 5 [IU] via SUBCUTANEOUS
  Administered 2016-02-20: 2 [IU] via SUBCUTANEOUS
  Administered 2016-02-20: 1 [IU] via SUBCUTANEOUS

## 2016-02-20 MED ORDER — ASPIRIN 325 MG PO TABS: 325.0000 mg | ORAL_TABLET | Freq: Once | ORAL | Status: DC

## 2016-02-20 MED ORDER — ENOXAPARIN SODIUM 40 MG/0.4ML ~~LOC~~ SOLN
40.0000 mg | SUBCUTANEOUS | Status: DC
  Administered 2016-02-20: 40 mg via SUBCUTANEOUS
  Filled 2016-02-20: qty 0.4

## 2016-02-20 MED ORDER — HEPARIN SODIUM (PORCINE) 1000 UNIT/ML IJ SOLN
INTRAMUSCULAR | Status: AC
  Filled 2016-02-20: qty 1

## 2016-02-20 MED ORDER — HYDROCODONE-ACETAMINOPHEN 5-325 MG PO TABS
1.0000 | ORAL_TABLET | Freq: Four times a day (QID) | ORAL | Status: DC | PRN
  Administered 2016-02-20 (×2): 1 via ORAL
  Filled 2016-02-20 (×2): qty 1

## 2016-02-20 MED ORDER — INSULIN GLARGINE 100 UNIT/ML ~~LOC~~ SOLN
23.0000 [IU] | Freq: Every day | SUBCUTANEOUS | Status: DC
  Filled 2016-02-20: qty 0.23

## 2016-02-20 MED ORDER — MIDAZOLAM HCL 2 MG/2ML IJ SOLN
INTRAMUSCULAR | Status: AC
Start: 2016-02-20 — End: 2016-02-20
  Filled 2016-02-20: qty 2

## 2016-02-20 MED ORDER — DIPHENHYDRAMINE HCL 25 MG PO CAPS
25.0000 mg | ORAL_CAPSULE | Freq: Once | ORAL | Status: AC
  Administered 2016-02-20: 25 mg via ORAL
  Filled 2016-02-20: qty 1

## 2016-02-20 MED ORDER — SODIUM CHLORIDE 0.9 % WEIGHT BASED INFUSION: 3.0000 mL/kg/h | INTRAVENOUS | Status: AC

## 2016-02-20 MED ORDER — FLUOXETINE HCL 20 MG PO CAPS
20.0000 mg | ORAL_CAPSULE | Freq: Three times a day (TID) | ORAL | Status: DC
  Administered 2016-02-20 (×2): 20 mg via ORAL
  Filled 2016-02-20 (×2): qty 1

## 2016-02-20 MED ORDER — MIDAZOLAM HCL 2 MG/2ML IJ SOLN
INTRAMUSCULAR | Status: DC | PRN
  Administered 2016-02-20: 2 mg via INTRAVENOUS

## 2016-02-20 MED ORDER — PROCHLORPERAZINE EDISYLATE 5 MG/ML IJ SOLN
10.0000 mg | INTRAMUSCULAR | Status: DC | PRN
  Administered 2016-02-20: 10 mg via INTRAVENOUS
  Filled 2016-02-20: qty 2

## 2016-02-20 MED ORDER — METOCLOPRAMIDE HCL 5 MG/ML IJ SOLN
10.0000 mg | Freq: Once | INTRAMUSCULAR | Status: AC
  Administered 2016-02-20: 10 mg via INTRAVENOUS
  Filled 2016-02-20: qty 2

## 2016-02-20 MED ORDER — ASPIRIN 81 MG PO CHEW
324.0000 mg | CHEWABLE_TABLET | Freq: Once | ORAL | Status: AC
  Administered 2016-02-20: 324 mg via ORAL
  Filled 2016-02-20: qty 4

## 2016-02-20 MED ORDER — ROSUVASTATIN CALCIUM 10 MG PO TABS
40.0000 mg | ORAL_TABLET | Freq: Every day | ORAL | Status: DC
  Administered 2016-02-20: 40 mg via ORAL
  Filled 2016-02-20: qty 4

## 2016-02-20 MED ORDER — METOPROLOL TARTRATE 50 MG PO TABS
50.0000 mg | ORAL_TABLET | Freq: Two times a day (BID) | ORAL | Status: DC
  Administered 2016-02-20: 50 mg via ORAL
  Filled 2016-02-20: qty 1

## 2016-02-20 MED ORDER — ACETAMINOPHEN 500 MG PO TABS
1000.0000 mg | ORAL_TABLET | Freq: Once | ORAL | Status: AC
  Administered 2016-02-20: 1000 mg via ORAL
  Filled 2016-02-20: qty 2

## 2016-02-20 MED ORDER — VERAPAMIL HCL 2.5 MG/ML IV SOLN
INTRAVENOUS | Status: AC
  Filled 2016-02-20: qty 2

## 2016-02-20 MED ORDER — FENTANYL CITRATE (PF) 100 MCG/2ML IJ SOLN
INTRAMUSCULAR | Status: DC | PRN
  Administered 2016-02-20: 25 ug via INTRAVENOUS

## 2016-02-20 MED ORDER — HEPARIN SODIUM (PORCINE) 1000 UNIT/ML IJ SOLN
INTRAMUSCULAR | Status: DC | PRN
  Administered 2016-02-20: 5000 [IU] via INTRAVENOUS

## 2016-02-20 MED ORDER — FENTANYL CITRATE (PF) 100 MCG/2ML IJ SOLN
INTRAMUSCULAR | Status: AC
Start: 2016-02-20 — End: 2016-02-20
  Filled 2016-02-20: qty 2

## 2016-02-20 MED ORDER — SODIUM CHLORIDE 0.9 % WEIGHT BASED INFUSION
1.0000 mL/kg/h | INTRAVENOUS | Status: DC
  Administered 2016-02-20: 1 mL/kg/h via INTRAVENOUS

## 2016-02-20 MED ORDER — SODIUM CHLORIDE 0.9 % IV SOLN
Freq: Once | INTRAVENOUS | Status: AC
  Administered 2016-02-20: 07:00:00 via INTRAVENOUS

## 2016-02-20 MED ORDER — IOPAMIDOL (ISOVUE-370) INJECTION 76%
INTRAVENOUS | Status: DC | PRN
  Administered 2016-02-20: 50 mL via INTRA_ARTERIAL

## 2016-02-20 MED ORDER — LIDOCAINE HCL (PF) 1 % IJ SOLN
INTRAMUSCULAR | Status: AC
  Filled 2016-02-20: qty 30

## 2016-02-20 MED ORDER — ASPIRIN 81 MG PO CHEW
81.0000 mg | CHEWABLE_TABLET | ORAL | Status: AC
  Administered 2016-02-20: 81 mg via ORAL
  Filled 2016-02-20: qty 1

## 2016-02-20 MED ORDER — DIAZEPAM 2 MG PO TABS
2.0000 mg | ORAL_TABLET | Freq: Three times a day (TID) | ORAL | Status: DC | PRN
  Administered 2016-02-20: 2 mg via ORAL
  Filled 2016-02-20: qty 1

## 2016-02-20 SURGICAL SUPPLY — 12 items

## 2016-02-20 NOTE — ED Notes (Signed)
[facility] en rout to pick up pt at this time. Pt made aware and verbalized understanding. No additional needs voiced.

## 2016-02-20 NOTE — ED Notes (Signed)
MD at bedside. 

## 2016-02-20 NOTE — Research (Signed)
CADLAD Informed Consent   Subject Name: [facility]  Subject met inclusion and exclusion criteria.  The informed consent form, study requirements and expectations were reviewed with the subject and questions and concerns were addressed prior to the signing of the consent form.  The subject verbalized understanding of the trail requirements.  The subject agreed to participate in the CADLAD trial and signed the informed consent.  The informed consent was obtained prior to performance of any protocol-specific procedures for the subject.  A copy of the signed informed consent was given to the subject and a copy was placed in the subject's medical record.  Sandie Ano 02/20/2016, 9:25

## 2016-02-20 NOTE — H&P (View-Only) (Signed)
Reason for Consult: chest pain   Referring Physician:Dr. Harvest Forest   PCP:  Elizabeth Sauer  Primary Cardiologist:New Dr. Loni Beckwith Rautio is an 50 y.o. female.    Chief Complaint: chest pain   HPI:  Asked to see 50 year old female admitted from [facility] in early AM after presenting with chest pain.  She has a hx of HTN, diabetes mellitus type 2, OSA on CPAP and tobacco abuse.  No prior known CAD.  She presented with chest pain that began last pm around 8-9 pm between shoulder blades and then in mid chest and across  associated symptoms included nausea, headache, heaviness with taking a deep inspiratory breath, and sporadic blood pressure elevations.  This AM another episode of chest pain/back pain.   She had cardiac cath 2003 with patent cors.   She also has significant family hx of premature CAD.    Troponins have been neg <0.03.  T Chol 281, TG 502, hgbA1c 9.3  EKG SR no acute changes;  CT head neg:  CXR no acute process.  Past Medical History  Diagnosis Date  . Diabetes mellitus   . Endometriosis   . Hypertension   . Morbid obesity ([place])   . Arthritis   . Anxiety   . Obstructive sleep apnea     CPAP NIGHTLY; sleep study 2007.  Marland Kitchen Renal disorder     kidney stone  . Hypersomnia, persistent     epworth 16   . Obesity, Class III, BMI 40-49.9 (morbid obesity) ([place])   . Lyme borreliosis   . Migraine     Past Surgical History  Procedure Laterality Date  . Cholecystectomy    . Knee surgery    . Knee arthrocentesis    . Tubal ligation    . Ablation on endometriosis    . Ablation on endometriosis      10 years ago    Family History  Problem Relation Age of Onset  . Diabetes Mother   . Thyroid disease Mother   . Depression Mother     with anxiety  . Stroke Mother   . Diabetes Father   . Hypertension Father   . Diabetes Sister   . Hypertension Sister   . Hypertension Brother    Social History:  reports that she has been smoking Cigarettes.  She  has a 3 pack-year smoking history. She has never used smokeless tobacco. She reports that she does not drink alcohol or use illicit drugs.  Allergies:  Allergies  Allergen Reactions  . Darvocet [Propoxyphene N-Acetaminophen] Hives and Itching  . Lisinopril Cough  . Metformin And Related Other (See Comments)    Chest pain  . Morphine And Related Nausea And Vomiting  . Percocet [Oxycodone-Acetaminophen] Itching    And headache  . Wellbutrin [Bupropion Hcl]     Hears voices  . Zofran [Ondansetron Hcl] Itching    OUTPATIENT MEDICATIONS: No current facility-administered medications on file prior to encounter.   Current Outpatient Prescriptions on File Prior to Encounter  Medication Sig Dispense Refill  . cyclobenzaprine (FLEXERIL) 5 MG tablet Take 1 tablet (5 mg total) by mouth 3 (three) times daily as needed for muscle spasms. 60 tablet 2  . diazepam (VALIUM) 2 MG tablet Take 1-2 tablets (2-4 mg total) by mouth every 8 (eight) hours as needed for anxiety or muscle spasms. (Patient taking differently: Take 1-2 mg by mouth every 12 (twelve) hours as needed for anxiety or muscle  spasms. Take 0.5 tablet at night Take 1 tablet in the morning) 40 tablet 0  . FLUoxetine (PROZAC) 20 MG capsule Take 1 capsule (20 mg total) by mouth 3 (three) times daily. (Patient taking differently: Take 20 mg by mouth 2 (two) times daily. ) 270 capsule 1  . furosemide (LASIX) 20 MG tablet Take 0.5-1 tablets (10-20 mg total) by mouth daily. (Patient taking differently: Take 20 mg by mouth daily. ) 60 tablet 0  . HYDROcodone-acetaminophen (NORCO/VICODIN) 5-325 MG tablet Take 1 tablet by mouth every 6 (six) hours as needed for moderate pain. (Patient taking differently: Take 1 tablet by mouth every 6 (six) hours as needed. For menstrual cycle pain) 20 tablet 0  . Insulin Glargine (LANTUS SOLOSTAR) 100 UNIT/ML Solostar Pen Start with 20U qhs - titrate by 2 Units every 4 days until am glucose is 120 or you reach 50 U qhs  whichever is 1st (Patient taking differently: Inject 23 Units into the skin at bedtime. Start with 20U qhs - titrate by 2 Units every 4 days until am glucose is 120 or you reach 50 U qhs whichever is 1st) 6 pen 0  . Liraglutide (VICTOZA) 18 MG/3ML SOPN INJECT 0.3MLS SUBCUTANEOUSLY DAILY 5 pen 0  . metoprolol tartrate (LOPRESSOR) 50 MG tablet Take 1 tablet (50 mg total) by mouth 2 (two) times daily. 180 tablet 0  . rosuvastatin (CRESTOR) 40 MG tablet Take 1 tablet (40 mg total) by mouth daily. (Patient taking differently: Take 40 mg by mouth at bedtime. ) 90 tablet 3  . ibuprofen (ADVIL,MOTRIN) 800 MG tablet Take 1 tablet (800 mg total) by mouth every 8 (eight) hours as needed. (Patient taking differently: Take 800 mg by mouth every 8 (eight) hours as needed. For menstrual cycle pain) 90 tablet 1  . [DISCONTINUED] Ranitidine HCl (ZANTAC PO) Take 1 tablet by mouth 2 (two) times daily. Patient uses this medication for heartburn.     CURRENT MEDICATIONS: Scheduled Meds: . enoxaparin (LOVENOX) injection  40 mg Subcutaneous Q24H  . FLUoxetine  20 mg Oral TID  . furosemide  10 mg Oral Daily  . insulin aspart  0-9 Units Subcutaneous TID WC  . insulin glargine  23 Units Subcutaneous Q2200  . metoprolol tartrate  50 mg Oral BID  . nicotine  21 mg Transdermal Daily  . rosuvastatin  40 mg Oral Daily   Continuous Infusions:  PRN Meds:.acetaminophen, butalbital-acetaminophen-caffeine, diazepam, HYDROcodone-acetaminophen, nitroGLYCERIN, prochlorperazine   Results for orders placed or performed during the hospital encounter of 02/19/16 (from the past 48 hour(s))  CBG monitoring, ED     Status: Abnormal   Collection Time: 02/19/16 10:31 PM  Result Value Ref Range   Glucose-Capillary 202 (H) 65 - 99 mg/dL  Basic metabolic panel     Status: Abnormal   Collection Time: 02/19/16 10:40 PM  Result Value Ref Range   Sodium 137 135 - 145 mmol/L   Potassium 3.9 3.5 - 5.1 mmol/L   Chloride 103 101 - 111 mmol/L     CO2 25 22 - 32 mmol/L   Glucose, Bld 208 (H) 65 - 99 mg/dL   BUN 21 (H) 6 - 20 mg/dL   Creatinine, Ser 0.76 0.44 - 1.00 mg/dL   Calcium 9.3 8.9 - 10.3 mg/dL   GFR calc non Af Amer >60 >60 mL/min   GFR calc Af Amer >60 >60 mL/min    Comment: (NOTE) The eGFR has been calculated using the CKD EPI equation. This calculation has not been validated  in all clinical situations. eGFR's persistently <60 mL/min signify possible Chronic Kidney Disease.    Anion gap 9 5 - 15  CBC     Status: Abnormal   Collection Time: 02/19/16 10:40 PM  Result Value Ref Range   WBC 12.1 (H) 4.0 - 10.5 K/uL   RBC 4.64 3.87 - 5.11 MIL/uL   Hemoglobin 14.2 12.0 - 15.0 g/dL   HCT 41.4 36.0 - 46.0 %   MCV 89.2 78.0 - 100.0 fL   MCH 30.6 26.0 - 34.0 pg   MCHC 34.3 30.0 - 36.0 g/dL   RDW 14.0 11.5 - 15.5 %   Platelets 279 150 - 400 K/uL  Troponin I     Status: None   Collection Time: 02/19/16 10:40 PM  Result Value Ref Range   Troponin I <0.03 <0.031 ng/mL    Comment:        NO INDICATION OF MYOCARDIAL INJURY.   Troponin I-serum (0, 3, 6 hours)     Status: None   Collection Time: 02/20/16  4:29 AM  Result Value Ref Range   Troponin I <0.03 <0.031 ng/mL    Comment:        NO INDICATION OF MYOCARDIAL INJURY.   D-dimer, quantitative (not at [facility])     Status: None   Collection Time: 02/20/16  4:29 AM  Result Value Ref Range   D-Dimer, Quant <0.27 0.00 - 0.50 ug/mL-FEU    Comment: (NOTE) At the manufacturer cut-off of 0.50 ug/mL FEU, this assay has been documented to exclude PE with a sensitivity and negative predictive value of 97 to 99%.  At this time, this assay has not been approved by the FDA to exclude DVT/VTE. Results should be correlated with clinical presentation.    Dg Chest 2 View  02/19/2016  CLINICAL DATA:  Acute onset of generalized chest pain, radiating to the left side of the neck. Upper back pain. Initial encounter. EXAM: CHEST  2 VIEW COMPARISON:  Chest radiograph performed  10/30/2012 FINDINGS: The lungs are well-aerated and clear. There is no evidence of focal opacification, pleural effusion or pneumothorax. The heart is normal in size; the mediastinal contour is within normal limits. No acute osseous abnormalities are seen. IMPRESSION: No acute cardiopulmonary process seen. Electronically Signed   By: Jeffery  Chang M.D.   On: 02/19/2016 23:23    ROS:General:no colds or fevers, no weight changes Skin:no rashes or ulcers HEENT:no blurred vision, no congestion CV:see HPI PUL:see HPI GI:no diarrhea constipation or melena, no indigestion GU:no hematuria, no dysuria MS:no joint pain, no claudication Neuro:no syncope, + mild last pm lightheadedness Endo:+ diabetes not well controlled, no thyroid disease  Blood pressure 117/52, pulse 62, temperature 97.6 F (36.4 C), temperature source Oral, resp. rate 18, height 5' 1" (1.549 m), weight 249 lb 1.6 oz (112.991 kg), last menstrual period 02/03/2016, SpO2 98 %.  Wt Readings from Last 3 Encounters:  02/20/16 249 lb 1.6 oz (112.991 kg)  02/12/16 250 lb (113.399 kg)  02/02/16 250 lb (113.399 kg)    PE: General:Pleasant affect, NAD Skin:Warm and dry, brisk capillary refill HEENT:normocephalic, sclera clear, mucus membranes moist Neck:supple, no JVD, no bruits  Heart:S1S2 RRR without murmur, gallup, rub or click Lungs:clear without rales, rhonchi, or wheezes Abd:obese, soft, non tender, + BS, do not palpate liver spleen or masses Ext:no lower ext edema, 2+ pedal pulses, 2+ radial pulses Neuro:alert and oriented X 3, MAE, follows commands, + facial symmetry Tele: SR to SB   Assessment/Plan Principal Problem:     Chest pain Active Problems:   HTN (hypertension)   DM (diabetes mellitus), type 2 (HCC)   Sleep apnea with use of continuous positive airway pressure (CPAP)   Leukocytosis  Chest pain-  EKG without changes, troponins neg though multiple risk factors for CAD -  If nuc would need 2 day study- but with  multiple risk factors and premature family hx.  And now more pain NTG responsive will proceed with cardiac cath today.  Keep NPO  The patient understands that risks included but are not limited to stroke (1 in 1000), death (1 in 49), kidney failure [usually temporary] (1 in 500), bleeding (1 in 200), allergic reaction [possibly serious] (1 in 200).    HTN  Controlled  DM-2 needs improved control  Hyperlipidemia with elevated trig.  Needs treatment  Tobacco use- discussed stopping  Obesity   OSA with CPAP  Cecilie Kicks  Nurse Practitioner Certified [place] Pager [phone] or after 5pm or weekends call [phone] 02/20/2016, 7:53 AM     The patient has been seen in conjunction with Cecilie Kicks, NP. All aspects of care have been considered and discussed. The patient has been personally interviewed, examined, and all clinical data has been reviewed.   50 year old with multiple risk factors as outlined above. Somewhat atypical story of interscapular discomfort radiating to the sternal area occurring in waves lasting between 15 and 30 minutes. She feels a sublingual nitroglycerin brought improvement at [facility] [employer]. Clinical course since admission including EKGs and markers have been unremarkable.  Plan diagnostic coronary angiography in setting of morbid obesity, insulin-dependent diabetes mellitus, tobacco use, and strong family history of premature atherosclerosis. The patient was counseled to undergo left heart catheterization, coronary angiography, and possible percutaneous coronary intervention with stent implantation. The procedural risks and benefits were discussed in detail. The risks discussed included death, stroke, myocardial infarction, life-threatening bleeding, limb ischemia, kidney injury, allergy, and possible emergency cardiac surgery. The risk of these significant complications were estimated to occur less than 1% of the time. After  discussion, the patient has agreed to proceed.

## 2016-02-20 NOTE — Interval H&P Note (Signed)
Cath Lab Visit (complete for each Cath Lab visit)  Clinical Evaluation Leading to the Procedure:   ACS: Yes.    Non-ACS:    Anginal Classification: CCS III  Anti-ischemic medical therapy: Minimal Therapy (1 class of medications)  Non-Invasive Test Results: No non-invasive testing performed  Prior CABG: No previous CABG      History and Physical Interval Note:  02/20/2016 2:00 PM  [name]  has presented today for surgery, with the diagnosis of cp  The various methods of treatment have been discussed with the patient and family. After consideration of risks, benefits and other options for treatment, the patient has consented to  Procedure(s): Left Heart Cath and Coronary Angiography (N/A) as a surgical intervention .  The patient's history has been reviewed, patient examined, no change in status, stable for surgery.  I have reviewed the patient's chart and labs.  Questions were answered to the patient's satisfaction.     [name]

## 2016-02-20 NOTE — Progress Notes (Signed)
50 yo F with obesity, DM, HTN, family hx of heart disease who presents with sudden onset chest pressure radiating to back.  Initial troponin negative and ECG without ST changes.  Wells score 0.    To tele obs for serial troponins.

## 2016-02-20 NOTE — H&P (Addendum)
History and Physical    [name] [id number] DOB: Jun 04, 1966 DOA: 02/19/2016  Referring MD/NP/PA: Dr. Claudine Mouton PCP: Windell Hummingbird, PA-C  Patient coming from: Transfer from [facility]   Chief Complaint: Chest pain  HPI: [name] is a 50 y.o. female with medical history significant of HTN, diabetes mellitus type 2, OSA on CPAP; with complaints of chest pain starting around 8-9 p.m. last night. Pain was described as sharp and located left substernal border with radiation of pain in between her shoulder blades. Associated symptoms included nausea, headache, heaviness with taking a deep inspiratory breath, and sporadic blood pressure. Here lately patient also complained of urinary frequency which she related to her sugars being elevated. Notes her last hemoglobin A1c was 9.3 approximately 3 weeks ago. She has been working with her primary care provider to improve her blood sugars. Denies any recent travel, recent sick contacts, lower leg swelling, or weight gain. Expresses a significant family history of heart disease and early death. Does that her dad died of heart issues at the age of 22, one sister passing at age of 17, and a another sister at the age of 10. Upon calling EMS patient notes that she was given aspirin and nitroglycerin which helped relieved symptoms, but subsequently led to worsening headache. Patient reports previous chest pain symptoms in the past approxximately 15 years ago for which she underwent a cardiac cath that was clear of any signs of blockage.  ED Course: Upon admission into the emergency department the patient was evaluated and seen to have stable vital signs. Initial laboratory revealed a  WBC 12.1, Cr 0.76, BUN  21, glucose 208, and troponin negative. Initial chest x-ray showed no acute abnormalities and EKG was within normal limits. TRH called to admit for chest pain rule out.  Review of Systems: As per HPI otherwise 10 point review of systems negative.   Past Medical  History  Diagnosis Date  . Diabetes mellitus   . Endometriosis   . Hypertension   . Morbid obesity ([place])   . Arthritis   . Anxiety   . Obstructive sleep apnea     CPAP NIGHTLY; sleep study 2007.  Marland Kitchen Renal disorder     kidney stone  . Hypersomnia, persistent     epworth 16   . Obesity, Class III, BMI 40-49.9 (morbid obesity) ([place])   . Lyme borreliosis   . Migraine     Past Surgical History  Procedure Laterality Date  . Cholecystectomy    . Knee surgery    . Knee arthrocentesis    . Tubal ligation    . Ablation on endometriosis    . Ablation on endometriosis      10 years ago     reports that she has been smoking Cigarettes.  She has a 3 pack-year smoking history. She has never used smokeless tobacco. She reports that she does not drink alcohol or use illicit drugs.  Allergies  Allergen Reactions  . Darvocet [Propoxyphene N-Acetaminophen] Hives and Itching  . Lisinopril Cough  . Metformin And Related Other (See Comments)    Chest pain  . Morphine And Related Nausea And Vomiting  . Percocet [Oxycodone-Acetaminophen] Itching    And headache  . Wellbutrin [Bupropion Hcl]     Hears voices  . Zofran [Ondansetron Hcl] Itching    Family History  Problem Relation Age of Onset  . Diabetes Mother   . Thyroid disease Mother   . Depression Mother     with anxiety  . Stroke  Mother   . Diabetes Father   . Hypertension Father   . Diabetes Sister   . Hypertension Sister   . Hypertension Brother     Prior to Admission medications   Medication Sig Start Date End Date Taking? Authorizing Provider  cyclobenzaprine (FLEXERIL) 5 MG tablet Take 1 tablet (5 mg total) by mouth 3 (three) times daily as needed for muscle spasms. 02/02/16   Mancel Bale, PA-C  diazepam (VALIUM) 2 MG tablet Take 1-2 tablets (2-4 mg total) by mouth every 8 (eight) hours as needed for anxiety or muscle spasms. 02/02/16   Mancel Bale, PA-C  FLUoxetine (PROZAC) 20 MG capsule Take 1 capsule (20 mg total) by  mouth 3 (three) times daily. 02/02/16   Mancel Bale, PA-C  furosemide (LASIX) 20 MG tablet Take 0.5-1 tablets (10-20 mg total) by mouth daily. 02/02/16   Mancel Bale, PA-C  glucose blood test strip 1 each by Other route 2 (two) times daily. Use as instructed 03/26/15   Mancel Bale, PA-C  HYDROcodone-acetaminophen (NORCO/VICODIN) 5-325 MG tablet Take 1 tablet by mouth every 6 (six) hours as needed for moderate pain. 02/02/16   Mancel Bale, PA-C  ibuprofen (ADVIL,MOTRIN) 800 MG tablet Take 1 tablet (800 mg total) by mouth every 8 (eight) hours as needed. 02/02/16   Mancel Bale, PA-C  Insulin Glargine (LANTUS SOLOSTAR) 100 UNIT/ML Solostar Pen Start with 20U qhs - titrate by 2 Units every 4 days until am glucose is 120 or you reach 50 U qhs whichever is 1st 02/11/16   Mancel Bale, PA-C  Insulin Pen Needle 32G X 5 MM MISC 1 application by Does not apply route daily. 02/02/16   Mancel Bale, PA-C  Liraglutide (VICTOZA) 18 MG/3ML SOPN INJECT 0.3MLS SUBCUTANEOUSLY DAILY 02/02/16   Mancel Bale, PA-C  metoprolol tartrate (LOPRESSOR) 50 MG tablet Take 1 tablet (50 mg total) by mouth 2 (two) times daily. 02/02/16   Mancel Bale, PA-C  rosuvastatin (CRESTOR) 40 MG tablet Take 1 tablet (40 mg total) by mouth daily. 02/02/16   Mancel Bale, PA-C    Physical Exam: Filed Vitals:   02/20/16 0200 02/20/16 0229 02/20/16 0245 02/20/16 0339  BP: 99/58 105/55 104/56 125/63  Pulse: 55 59 52   Temp:  97.8 F (36.6 C)  98.6 F (37 C)  TempSrc:  Oral  Oral  Resp: 15 14 14 18   Height:    5\' 1"  (1.549 m)  Weight:    112.991 kg (249 lb 1.6 oz)  SpO2: 94% 95% 95% 97%      Constitutional: Obese female in no acute distress. Filed Vitals:   02/20/16 0200 02/20/16 0229 02/20/16 0245 02/20/16 0339  BP: 99/58 105/55 104/56 125/63  Pulse: 55 59 52   Temp:  97.8 F (36.6 C)  98.6 F (37 C)  TempSrc:  Oral  Oral  Resp: 15 14 14 18   Height:    5\' 1"  (1.549 m)  Weight:    112.991 kg (249 lb 1.6 oz)  SpO2: 94% 95% 95%  97%   Eyes: PERRL, lids and conjunctivae normal ENMT: Mucous membranes are moist. Posterior pharynx clear of any exudate or lesions.Normal dentition.  Neck: normal, supple, no masses, no thyromegaly Respiratory: clear to auscultation bilaterally, no wheezing, no crackles. Normal respiratory effort. No accessory muscle use.  Cardiovascular: Bradycardic, no murmurs / rubs / gallops. No extremity edema. 2+ pedal pulses. No carotid bruits.  Abdomen: Mild epigastric tenderness noted,  no masses palpated. No hepatosplenomegaly. Bowel sounds positive.  Musculoskeletal: no clubbing / cyanosis. No joint deformity upper and lower extremities. Good ROM, no contractures. Normal muscle tone.  Skin: no rashes, lesions, ulcers. No induration Neurologic: CN 2-12 grossly intact. Sensation intact, DTR normal. Strength 5/5 in all 4.  Psychiatric: Normal judgment and insight. Alert and oriented x 3. Normal mood.     Labs on Admission: I have personally reviewed following labs and imaging studies  CBC:  Recent Labs Lab 02/19/16 2240  WBC 12.1*  HGB 14.2  HCT 41.4  MCV 89.2  PLT 123XX123   Basic Metabolic Panel:  Recent Labs Lab 02/19/16 2240  NA 137  K 3.9  CL 103  CO2 25  GLUCOSE 208*  BUN 21*  CREATININE 0.76  CALCIUM 9.3   GFR: Estimated Creatinine Clearance: 99.2 mL/min (by C-G formula based on Cr of 0.76). Liver Function Tests: No results for input(s): AST, ALT, ALKPHOS, BILITOT, PROT, ALBUMIN in the last 168 hours. No results for input(s): LIPASE, AMYLASE in the last 168 hours. No results for input(s): AMMONIA in the last 168 hours. Coagulation Profile: No results for input(s): INR, PROTIME in the last 168 hours. Cardiac Enzymes:  Recent Labs Lab 02/19/16 2240  TROPONINI <0.03   BNP (last 3 results) No results for input(s): PROBNP in the last 8760 hours. HbA1C: No results for input(s): HGBA1C in the last 72 hours. CBG:  Recent Labs Lab 02/19/16 2231  GLUCAP 202*   Lipid  Profile: No results for input(s): CHOL, HDL, LDLCALC, TRIG, CHOLHDL, LDLDIRECT in the last 72 hours. Thyroid Function Tests: No results for input(s): TSH, T4TOTAL, FREET4, T3FREE, THYROIDAB in the last 72 hours. Anemia Panel: No results for input(s): VITAMINB12, FOLATE, FERRITIN, TIBC, IRON, RETICCTPCT in the last 72 hours. Urine analysis:    Component Value Date/Time   COLORURINE AMBER* 04/19/2013 0932   APPEARANCEUR CLEAR 04/19/2013 0932   LABSPEC 1.035* 04/19/2013 0932   PHURINE 5.5 04/19/2013 0932   GLUCOSEU >1000* 04/19/2013 0932   HGBUR NEGATIVE 04/19/2013 0932   BILIRUBINUR small 12/08/2014 1305   BILIRUBINUR NEGATIVE 04/19/2013 0932   KETONESUR >80* 04/19/2013 0932   PROTEINUR neg 12/08/2014 1305   PROTEINUR NEGATIVE 04/19/2013 0932   UROBILINOGEN 0.2 12/08/2014 1305   UROBILINOGEN 0.2 04/19/2013 0932   NITRITE neg 12/08/2014 1305   NITRITE NEGATIVE 04/19/2013 0932   LEUKOCYTESUR Negative 12/08/2014 1305   Sepsis Labs: No results found for this or any previous visit (from the past 240 hour(s)).   Radiological Exams on Admission: Dg Chest 2 View  02/19/2016  CLINICAL DATA:  Acute onset of generalized chest pain, radiating to the left side of the neck. Upper back pain. Initial encounter. EXAM: CHEST  2 VIEW COMPARISON:  Chest radiograph performed 10/30/2012 FINDINGS: The lungs are well-aerated and clear. There is no evidence of focal opacification, pleural effusion or pneumothorax. The heart is normal in size; the mediastinal contour is within normal limits. No acute osseous abnormalities are seen. IMPRESSION: No acute cardiopulmonary process seen. Electronically Signed   By: Garald Balding M.D.   On: 02/19/2016 23:23    EKG: Independently reviewed. Normal sinus rhythm   Assessment/Plan Chest pain: Patient reporting chest pain with risk factors including HLD, obesity, HTN, and  family history. Heart score of 4.  - Admit to telemetry bed - Trend cardiac troponins - Check  UDS and added on lipase - Repeat EKG at 6 AM - Check echocardiogram - lipid panel - consider need of cardiology  consulted in a.m. for possible need of stress test - Zofran prn nausea  Leukocytosis with urinary frequency report: WBC count 12.1 on admission. Chest x-ray showed no acute abnormalities. Patient reports increased urinary frequency. - Check urinalysis - Continue to monitor   Headache - Fioricet prn HA     Anxiety depression - Continue Prozac, Valium prn  Diabetes mellitus type 2 uncontrolled: Patient notes last hemoglobin A1c was 9.4 just 3 weeks ago. - Held victoza - Continue glargine 23 units subcutaneous  at bedtime - CBGs every before meals with sensitive sliding scale insulin   Tobacco abuse: Patient still reports smoking anywhere from 3-4 cigarettes per day  - Counseled patient on the need for cessation of tobacco  - Nicotine patch    Obesity  Hyperlipidemia - Continue Crestor  OSA on CPAP  DVT prophylaxis: lovenox Code Status: Full Family Communication: None Disposition Plan: Possible discharge home in 1-2 days depending on pending studies  Consults called: None Admission status: Observation telemetry  Norval Morton MD [facility] Pager [phone]  If 7PM-7AM, please contact night-coverage [email or web address] Password [email or web address]  02/20/2016, 4:14 AM

## 2016-02-20 NOTE — Progress Notes (Signed)
  Echocardiogram 2D Echocardiogram has been performed.  [name] 02/20/2016, 1:01 PM

## 2016-02-20 NOTE — Consult Note (Signed)
Reason for Consult: chest pain   Referring Physician:Dr. Harvest Forest   PCP:  Elizabeth Sauer  Primary Cardiologist:[name] [name] is an 50 y.o. female.    Chief Complaint: chest pain   HPI:  Asked to see 50 year old female admitted from [facility] in early AM after presenting with chest pain.  She has a hx of HTN, diabetes mellitus type 2, OSA on CPAP and tobacco abuse.  No prior known CAD.  She presented with chest pain that began last pm around 8-9 pm between shoulder blades and then in mid chest and across  associated symptoms included nausea, headache, heaviness with taking a deep inspiratory breath, and sporadic blood pressure elevations.  This AM another episode of chest pain/back pain.   She had cardiac cath 2003 with patent cors.   She also has significant family hx of premature CAD.    Troponins have been neg <0.03.  T Chol 281, TG 502, hgbA1c 9.3  EKG SR no acute changes;  CT head neg:  CXR no acute process.  Past Medical History  Diagnosis Date  . Diabetes mellitus   . Endometriosis   . Hypertension   . Morbid obesity ([place])   . Arthritis   . Anxiety   . Obstructive sleep apnea     CPAP NIGHTLY; sleep study 2007.  Marland Kitchen Renal disorder     kidney stone  . Hypersomnia, persistent     epworth 16   . Obesity, Class III, BMI 40-49.9 (morbid obesity) ([place])   . Lyme borreliosis   . Migraine     Past Surgical History  Procedure Laterality Date  . Cholecystectomy    . Knee surgery    . Knee arthrocentesis    . Tubal ligation    . Ablation on endometriosis    . Ablation on endometriosis      10 years ago    Family History  Problem Relation Age of Onset  . Diabetes Mother   . Thyroid disease Mother   . Depression Mother     with anxiety  . Stroke Mother   . Diabetes Father   . Hypertension Father   . Diabetes Sister   . Hypertension Sister   . Hypertension Brother    Social History:  reports that she has been smoking Cigarettes.  She  has a 3 pack-year smoking history. She has never used smokeless tobacco. She reports that she does not drink alcohol or use illicit drugs.  Allergies:  Allergies  Allergen Reactions  . Darvocet [Propoxyphene N-Acetaminophen] Hives and Itching  . Lisinopril Cough  . Metformin And Related Other (See Comments)    Chest pain  . Morphine And Related Nausea And Vomiting  . Percocet [Oxycodone-Acetaminophen] Itching    And headache  . Wellbutrin [Bupropion Hcl]     Hears voices  . Zofran [Ondansetron Hcl] Itching    OUTPATIENT MEDICATIONS: No current facility-administered medications on file prior to encounter.   Current Outpatient Prescriptions on File Prior to Encounter  Medication Sig Dispense Refill  . cyclobenzaprine (FLEXERIL) 5 MG tablet Take 1 tablet (5 mg total) by mouth 3 (three) times daily as needed for muscle spasms. 60 tablet 2  . diazepam (VALIUM) 2 MG tablet Take 1-2 tablets (2-4 mg total) by mouth every 8 (eight) hours as needed for anxiety or muscle spasms. (Patient taking differently: Take 1-2 mg by mouth every 12 (twelve) hours as needed for anxiety or muscle  spasms. Take 0.5 tablet at night Take 1 tablet in the morning) 40 tablet 0  . FLUoxetine (PROZAC) 20 MG capsule Take 1 capsule (20 mg total) by mouth 3 (three) times daily. (Patient taking differently: Take 20 mg by mouth 2 (two) times daily. ) 270 capsule 1  . furosemide (LASIX) 20 MG tablet Take 0.5-1 tablets (10-20 mg total) by mouth daily. (Patient taking differently: Take 20 mg by mouth daily. ) 60 tablet 0  . HYDROcodone-acetaminophen (NORCO/VICODIN) 5-325 MG tablet Take 1 tablet by mouth every 6 (six) hours as needed for moderate pain. (Patient taking differently: Take 1 tablet by mouth every 6 (six) hours as needed. For menstrual cycle pain) 20 tablet 0  . Insulin Glargine (LANTUS SOLOSTAR) 100 UNIT/ML Solostar Pen Start with 20U qhs - titrate by 2 Units every 4 days until am glucose is 120 or you reach 50 U qhs  whichever is 1st (Patient taking differently: Inject 23 Units into the skin at bedtime. Start with 20U qhs - titrate by 2 Units every 4 days until am glucose is 120 or you reach 50 U qhs whichever is 1st) 6 pen 0  . Liraglutide (VICTOZA) 18 MG/3ML SOPN INJECT 0.3MLS SUBCUTANEOUSLY DAILY 5 pen 0  . metoprolol tartrate (LOPRESSOR) 50 MG tablet Take 1 tablet (50 mg total) by mouth 2 (two) times daily. 180 tablet 0  . rosuvastatin (CRESTOR) 40 MG tablet Take 1 tablet (40 mg total) by mouth daily. (Patient taking differently: Take 40 mg by mouth at bedtime. ) 90 tablet 3  . ibuprofen (ADVIL,MOTRIN) 800 MG tablet Take 1 tablet (800 mg total) by mouth every 8 (eight) hours as needed. (Patient taking differently: Take 800 mg by mouth every 8 (eight) hours as needed. For menstrual cycle pain) 90 tablet 1  . [DISCONTINUED] Ranitidine HCl (ZANTAC PO) Take 1 tablet by mouth 2 (two) times daily. Patient uses this medication for heartburn.     CURRENT MEDICATIONS: Scheduled Meds: . enoxaparin (LOVENOX) injection  40 mg Subcutaneous Q24H  . FLUoxetine  20 mg Oral TID  . furosemide  10 mg Oral Daily  . insulin aspart  0-9 Units Subcutaneous TID WC  . insulin glargine  23 Units Subcutaneous Q2200  . metoprolol tartrate  50 mg Oral BID  . nicotine  21 mg Transdermal Daily  . rosuvastatin  40 mg Oral Daily   Continuous Infusions:  PRN Meds:.acetaminophen, butalbital-acetaminophen-caffeine, diazepam, HYDROcodone-acetaminophen, nitroGLYCERIN, prochlorperazine   Results for orders placed or performed during the hospital encounter of 02/19/16 (from the past 48 hour(s))  CBG monitoring, ED     Status: Abnormal   Collection Time: 02/19/16 10:31 PM  Result Value Ref Range   Glucose-Capillary 202 (H) 65 - 99 mg/dL  Basic metabolic panel     Status: Abnormal   Collection Time: 02/19/16 10:40 PM  Result Value Ref Range   Sodium 137 135 - 145 mmol/L   Potassium 3.9 3.5 - 5.1 mmol/L   Chloride 103 101 - 111 mmol/L     CO2 25 22 - 32 mmol/L   Glucose, Bld 208 (H) 65 - 99 mg/dL   BUN 21 (H) 6 - 20 mg/dL   Creatinine, Ser 0.76 0.44 - 1.00 mg/dL   Calcium 9.3 8.9 - 10.3 mg/dL   GFR calc non Af Amer >60 >60 mL/min   GFR calc Af Amer >60 >60 mL/min    Comment: (NOTE) The eGFR has been calculated using the CKD EPI equation. This calculation has not been validated  in all clinical situations. eGFR's persistently <60 mL/min signify possible Chronic Kidney Disease.    Anion gap 9 5 - 15  CBC     Status: Abnormal   Collection Time: 02/19/16 10:40 PM  Result Value Ref Range   WBC 12.1 (H) 4.0 - 10.5 K/uL   RBC 4.64 3.87 - 5.11 MIL/uL   Hemoglobin 14.2 12.0 - 15.0 g/dL   HCT 41.4 36.0 - 46.0 %   MCV 89.2 78.0 - 100.0 fL   MCH 30.6 26.0 - 34.0 pg   MCHC 34.3 30.0 - 36.0 g/dL   RDW 14.0 11.5 - 15.5 %   Platelets 279 150 - 400 K/uL  Troponin I     Status: None   Collection Time: 02/19/16 10:40 PM  Result Value Ref Range   Troponin I <0.03 <0.031 ng/mL    Comment:        NO INDICATION OF MYOCARDIAL INJURY.   Troponin I-serum (0, 3, 6 hours)     Status: None   Collection Time: 02/20/16  4:29 AM  Result Value Ref Range   Troponin I <0.03 <0.031 ng/mL    Comment:        NO INDICATION OF MYOCARDIAL INJURY.   D-dimer, quantitative (not at [facility])     Status: None   Collection Time: 02/20/16  4:29 AM  Result Value Ref Range   D-Dimer, Quant <0.27 0.00 - 0.50 ug/mL-FEU    Comment: (NOTE) At the manufacturer cut-off of 0.50 ug/mL FEU, this assay has been documented to exclude PE with a sensitivity and negative predictive value of 97 to 99%.  At this time, this assay has not been approved by the FDA to exclude DVT/VTE. Results should be correlated with clinical presentation.    Dg Chest 2 View  02/19/2016  CLINICAL DATA:  Acute onset of generalized chest pain, radiating to the left side of the neck. Upper back pain. Initial encounter. EXAM: CHEST  2 VIEW COMPARISON:  Chest radiograph performed  10/30/2012 FINDINGS: The lungs are well-aerated and clear. There is no evidence of focal opacification, pleural effusion or pneumothorax. The heart is normal in size; the mediastinal contour is within normal limits. No acute osseous abnormalities are seen. IMPRESSION: No acute cardiopulmonary process seen. Electronically Signed   By: Garald Balding M.D.   On: 02/19/2016 23:23    JAS:[id number]:BH colds or fevers, no weight changes Skin:no rashes or ulcers HEENT:no blurred vision, no congestion CV:see HPI PUL:see HPI GI:no diarrhea constipation or melena, no indigestion GU:no hematuria, no dysuria MS:no joint pain, no claudication Neuro:no syncope, + mild last pm lightheadedness Endo:+ diabetes not well controlled, no thyroid disease  Blood pressure 117/52, pulse 62, temperature 97.6 F (36.4 C), temperature source Oral, resp. rate 18, height _0  (1.549 m), weight 249 lb 1.6 oz (112.991 kg), last menstrual period 02/03/2016, SpO2 98 %.  Wt Readings from Last 3 Encounters:  02/20/16 249 lb 1.6 oz (112.991 kg)  02/12/16 250 lb (113.399 kg)  02/02/16 250 lb (113.399 kg)    PE: General:Pleasant affect, NAD Skin:Warm and dry, brisk capillary refill HEENT:normocephalic, sclera clear, mucus membranes moist Neck:supple, no JVD, no bruits  Heart:S1S2 RRR without murmur, gallup, rub or click Lungs:clear without rales, rhonchi, or wheezes ALP:FXTKW, soft, non tender, + BS, do not palpate liver spleen or masses Ext:no lower ext edema, 2+ pedal pulses, 2+ radial pulses Neuro:alert and oriented X 3, MAE, follows commands, + facial symmetry Tele: SR to SB   Assessment/Plan Principal Problem:  Chest pain Active Problems:   HTN (hypertension)   DM (diabetes mellitus), type 2 (HCC)   Sleep apnea with use of continuous positive airway pressure (CPAP)   Leukocytosis  Chest pain-  EKG without changes, troponins neg though multiple risk factors for CAD -  If nuc would need 2 day study- but with  multiple risk factors and premature family hx.  And now more pain NTG responsive will proceed with cardiac cath today.  Keep NPO  The patient understands that risks included but are not limited to stroke (1 in 1000), death (1 in 49), kidney failure [usually temporary] (1 in 500), bleeding (1 in 200), allergic reaction [possibly serious] (1 in 200).    HTN  Controlled  DM-2 needs improved control  Hyperlipidemia with elevated trig.  Needs treatment  Tobacco use- discussed stopping  Obesity   OSA with CPAP  Cecilie Kicks  Nurse Practitioner Certified [place] Pager [phone] or after 5pm or weekends call [phone] 02/20/2016, 7:53 AM     The patient has been seen in conjunction with Cecilie Kicks, NP. All aspects of care have been considered and discussed. The patient has been personally interviewed, examined, and all clinical data has been reviewed.   50 year old with multiple risk factors as outlined above. Somewhat atypical story of interscapular discomfort radiating to the sternal area occurring in waves lasting between 15 and 30 minutes. She feels a sublingual nitroglycerin brought improvement at [facility] [employer]. Clinical course since admission including EKGs and markers have been unremarkable.  Plan diagnostic coronary angiography in setting of morbid obesity, insulin-dependent diabetes mellitus, tobacco use, and strong family history of premature atherosclerosis. The patient was counseled to undergo left heart catheterization, coronary angiography, and possible percutaneous coronary intervention with stent implantation. The procedural risks and benefits were discussed in detail. The risks discussed included death, stroke, myocardial infarction, life-threatening bleeding, limb ischemia, kidney injury, allergy, and possible emergency cardiac surgery. The risk of these significant complications were estimated to occur less than 1% of the time. After  discussion, the patient has agreed to proceed.

## 2016-02-20 NOTE — Progress Notes (Signed)
Good news with cath.  Follow up with PCP, important to manage DM. Call if further problems.

## 2016-02-26 NOTE — Discharge Summary (Signed)
Physician Discharge Summary  [place] [id number] DOB: 05-04-1966 DOA: 02/19/2016  PCP: Elizabeth Sauer  Admit date: 02/19/2016 Discharge date: 02/20/2016  Time spent: 25 minutes  Recommendations for Outpatient Follow-up:  1. Follow up with PCP in 2 weeks.    Discharge Diagnoses:  Principal Problem:   Chest pain Active Problems:   HTN (hypertension)   DM (diabetes mellitus), type 2 (HCC)   Sleep apnea with use of continuous positive airway pressure (CPAP)   Leukocytosis   Discharge Condition: improved  Diet recommendation: carb modified diet.   Filed Weights   02/19/16 2221 02/20/16 0339  Weight: 112.492 kg (248 lb) 112.991 kg (249 lb 1.6 oz)    History of present illness/Hospital Course:   [name] is a 50 y.o. female with medical history significant of HTN, diabetes mellitus type 2, OSA on CPAP; with complaints of chest pain. She was admitted for evaluation of ACS. ACS was ruled out, cardiology consulted and she underwent cardiac catheterization, which showed normal coronary arteried, normal LV function and LVEDP.  Recommended outpatient follow up with PCP for lifestyle modification, weight loss and medical therapy.   Diabetes mellitus: CBG (last 3)  No results for input(s): GLUCAP in the last 72 hours.  Resume home meds.     Procedures:  Cardiac catheterization.  Consultations:  cardiology  Discharge Exam: Filed Vitals:   02/20/16 1731 02/20/16 1819  BP: 128/57 126/67  Pulse:    Temp:    Resp: 5 16    General: alert comfortable.  Cardiovascular: s1s2 Respiratory: ctab  Discharge Instructions   Discharge Instructions    Diet Carb Modified    Complete by:  As directed      Discharge instructions    Complete by:  As directed   Follow up with PCP in one week, post hospitalization visit.          Discharge Medication List as of 02/20/2016  6:30 PM    CONTINUE these medications which have NOT CHANGED   Details  diazepam  (VALIUM) 2 MG tablet Take 1-2 tablets (2-4 mg total) by mouth every 8 (eight) hours as needed for anxiety or muscle spasms., Starting 02/02/2016, Until Discontinued, Print    FLUoxetine (PROZAC) 20 MG capsule Take 1 capsule (20 mg total) by mouth 3 (three) times daily., Starting 02/02/2016, Until Discontinued, Normal    furosemide (LASIX) 20 MG tablet Take 0.5-1 tablets (10-20 mg total) by mouth daily., Starting 02/02/2016, Until Discontinued, Normal    HYDROcodone-acetaminophen (NORCO/VICODIN) 5-325 MG tablet Take 1 tablet by mouth every 6 (six) hours as needed for moderate pain., Starting 02/02/2016, Until Discontinued, Print    Insulin Glargine (LANTUS SOLOSTAR) 100 UNIT/ML Solostar Pen Start with 20U qhs - titrate by 2 Units every 4 days until am glucose is 120 or you reach 50 U qhs whichever is 1st, Normal    Liraglutide (VICTOZA) 18 MG/3ML SOPN INJECT 0.3MLS SUBCUTANEOUSLY DAILY, Normal    metoprolol tartrate (LOPRESSOR) 50 MG tablet Take 1 tablet (50 mg total) by mouth 2 (two) times daily., Starting 02/02/2016, Until Discontinued, Normal    rosuvastatin (CRESTOR) 40 MG tablet Take 1 tablet (40 mg total) by mouth daily., Starting 02/02/2016, Until Discontinued, Normal      STOP taking these medications     cyclobenzaprine (FLEXERIL) 5 MG tablet      ibuprofen (ADVIL,MOTRIN) 800 MG tablet        Allergies  Allergen Reactions  . Darvocet [Propoxyphene N-Acetaminophen] Hives and Itching  . Lisinopril Cough  .  Metformin And Related Other (See Comments)    Chest pain  . Morphine And Related Nausea And Vomiting  . Percocet [Oxycodone-Acetaminophen] Itching    And headache  . Wellbutrin [Bupropion Hcl]     Hears voices  . Zofran [Ondansetron Hcl] Itching      The results of significant diagnostics from this hospitalization (including imaging, microbiology, ancillary and laboratory) are listed below for reference.    Significant Diagnostic Studies: Dg Chest 2 View  02/19/2016  CLINICAL  DATA:  Acute onset of generalized chest pain, radiating to the left side of the neck. Upper back pain. Initial encounter. EXAM: CHEST  2 VIEW COMPARISON:  Chest radiograph performed 10/30/2012 FINDINGS: The lungs are well-aerated and clear. There is no evidence of focal opacification, pleural effusion or pneumothorax. The heart is normal in size; the mediastinal contour is within normal limits. No acute osseous abnormalities are seen. IMPRESSION: No acute cardiopulmonary process seen. Electronically Signed   By: Garald Balding M.D.   On: 02/19/2016 23:23   Ct Head Wo Contrast  02/12/2016  CLINICAL DATA:  Migraine headache this morning. EXAM: CT HEAD WITHOUT CONTRAST TECHNIQUE: Contiguous axial images were obtained from the base of the skull through the vertex without intravenous contrast. COMPARISON:  CTA of the neck 11/13/2010 FINDINGS: Brain: No evidence of acute infarction, hemorrhage, extra-axial collection, ventriculomegaly, or mass effect. Vascular: No hyperdense vessel or unexpected calcification. Skull: Negative for fracture or focal lesion. Sinuses/Orbits: No acute findings. Other: None. IMPRESSION: No acute intracranial abnormality. Electronically Signed   By: Fidela Salisbury M.D.   On: 02/12/2016 14:35    Microbiology: Recent Results (from the past 240 hour(s))  MRSA PCR Screening     Status: None   Collection Time: 02/20/16  3:45 AM  Result Value Ref Range Status   MRSA by PCR NEGATIVE NEGATIVE Final    Comment:        The GeneXpert MRSA Assay (FDA approved for NASAL specimens only), is one component of a comprehensive MRSA colonization surveillance program. It is not intended to diagnose MRSA infection nor to guide or monitor treatment for MRSA infections.      Labs: Basic Metabolic Panel:  Recent Labs Lab 02/19/16 2240  NA 137  K 3.9  CL 103  CO2 25  GLUCOSE 208*  BUN 21*  CREATININE 0.76  CALCIUM 9.3   Liver Function Tests: No results for input(s): AST,  ALT, ALKPHOS, BILITOT, PROT, ALBUMIN in the last 168 hours.  Recent Labs Lab 02/20/16 0727  LIPASE 127*   No results for input(s): AMMONIA in the last 168 hours. CBC:  Recent Labs Lab 02/19/16 2240 02/20/16 0856  WBC 12.1* 10.1  HGB 14.2 12.7  HCT 41.4 38.6  MCV 89.2 90.0  PLT 279 237   Cardiac Enzymes:  Recent Labs Lab 02/19/16 2240 02/20/16 0429 02/20/16 0727 02/20/16 0856  TROPONINI <0.03 <0.03 <0.03 <0.03   BNP: BNP (last 3 results) No results for input(s): BNP in the last 8760 hours.  ProBNP (last 3 results) No results for input(s): PROBNP in the last 8760 hours.  CBG:  Recent Labs Lab 02/19/16 2231 02/20/16 0735 02/20/16 1223 02/20/16 1612  GLUCAP 202* 281* 135* 171*       Signed:  Rayyan Orsborn MD.  [facility] 02/26/2016, 12:43 PM

## 2016-03-08 ENCOUNTER — Ambulatory Visit (INDEPENDENT_AMBULATORY_CARE_PROVIDER_SITE_OTHER): Payer: BLUE CROSS/BLUE SHIELD

## 2016-03-08 ENCOUNTER — Ambulatory Visit (INDEPENDENT_AMBULATORY_CARE_PROVIDER_SITE_OTHER): Payer: BLUE CROSS/BLUE SHIELD | Admitting: Physician Assistant

## 2016-03-08 ENCOUNTER — Telehealth: Payer: Self-pay

## 2016-03-08 VITALS — BP 130/80 | HR 61 | Temp 98.0°F | Resp 16

## 2016-03-08 DIAGNOSIS — M79671 Pain in right foot: Secondary | ICD-10-CM

## 2016-03-08 DIAGNOSIS — M25571 Pain in right ankle and joints of right foot: Secondary | ICD-10-CM | POA: Diagnosis not present

## 2016-03-08 DIAGNOSIS — E1159 Type 2 diabetes mellitus with other circulatory complications: Secondary | ICD-10-CM | POA: Diagnosis not present

## 2016-03-08 DIAGNOSIS — M7989 Other specified soft tissue disorders: Secondary | ICD-10-CM | POA: Diagnosis not present

## 2016-03-08 DIAGNOSIS — Z794 Long term (current) use of insulin: Secondary | ICD-10-CM | POA: Diagnosis not present

## 2016-03-08 DIAGNOSIS — N809 Endometriosis, unspecified: Secondary | ICD-10-CM | POA: Diagnosis not present

## 2016-03-08 DIAGNOSIS — S99921A Unspecified injury of right foot, initial encounter: Secondary | ICD-10-CM | POA: Diagnosis not present

## 2016-03-08 DIAGNOSIS — G4700 Insomnia, unspecified: Secondary | ICD-10-CM | POA: Diagnosis not present

## 2016-03-08 DIAGNOSIS — F419 Anxiety disorder, unspecified: Secondary | ICD-10-CM | POA: Diagnosis not present

## 2016-03-08 MED ORDER — INSULIN GLARGINE 300 UNIT/ML ~~LOC~~ SOPN: 50.0000 [IU] | PEN_INJECTOR | Freq: Every day | SUBCUTANEOUS | Status: DC

## 2016-03-08 MED ORDER — HYDROCODONE-ACETAMINOPHEN 5-325 MG PO TABS: 1.0000 | ORAL_TABLET | Freq: Four times a day (QID) | ORAL | Status: DC | PRN

## 2016-03-08 MED ORDER — DIAZEPAM 2 MG PO TABS: 1.0000 mg | ORAL_TABLET | Freq: Two times a day (BID) | ORAL | Status: DC | PRN

## 2016-03-08 NOTE — Patient Instructions (Addendum)
Stop the Lantus. Start the [employer].  Follow up with Sarah in 2-4 weeks.    IF you received an x-ray today, you will receive an invoice from [facility]. Please contact [facility] at [phone] with questions or concerns regarding your invoice.   IF you received labwork today, you will receive an invoice from [employer]. Please contact [facility] at [phone] with questions or concerns regarding your invoice.   Our billing staff will not be able to assist you with questions regarding bills from these companies.  You will be contacted with the lab results as soon as they are available. The fastest way to get your results is to activate your My Chart account. Instructions are located on the last page of this paperwork. If you have not heard from [place] regarding the results in 2 weeks, please contact this office.

## 2016-03-08 NOTE — Telephone Encounter (Signed)
Needs a refill of her vicodan and valium and also wanted to let us know too that she feels the insulin of 50 units needs to be increased   Best number [phone]

## 2016-03-08 NOTE — Progress Notes (Signed)
Patient ID: [name], female    DOB: Nov 12, 1965, 50 y.o.   MRN: [id number]  PCP: Elizabeth Sauer  Subjective:   Chief Complaint  Patient presents with  . Fall    tripped over cord and right foot pain up to lower leg hurt   . Medication Refill    hydrocodone, diazepam and discuss insulin     HPI Presents for evaluation of RIGHT foot and ankle pain following a fall today when she tripped over a cord.  Similar injury previously, "achilles pointer." Believes she has done the same thing today.  Pain with weight bearing, movement of the toes, foot and ankle. No bruising. Requests pain medication.  In addition, she requests refills of hydrocodone (for menstrual pain associated with endometriosis) and diazepam (for anxiety and muscle spasms) and wants to discuss her diabetes.  She is currently taking Lantus 50 units daily. She misunderstood that it is a basal insulin, so she's been taking 10 units at about 10 am and then 40 units in the evenings. She also takes Victoza 0.3 mls [place] daily. The Lantus instructions were to increase by 2-4 units until her fasting glucose was <120 or she reached 50 units, with the plan to switch to Toujeo if she required more than 50 units. Her fasting readings are still above 200.     Review of Systems  Respiratory: Negative for cough and shortness of breath.   Cardiovascular: Negative for chest pain and palpitations.  Musculoskeletal: Positive for arthralgias (RIGHT foot/ankle).       Patient Active Problem List   Diagnosis Date Noted  . Chest pain 02/20/2016  . Leukocytosis 02/20/2016  . Vitamin D insufficiency 09/25/2015  . Endometriosis 01/17/2014  . Sleep apnea with use of continuous positive airway pressure (CPAP) 06/27/2013  . Nocturia 06/27/2013  . Obesity, Class III, BMI 40-49.9 (morbid obesity) ([place])   . Obstructive sleep apnea (adult) (pediatric) 02/15/2013  . Hypersomnia, persistent   . HTN (hypertension) 02/03/2013  . DM  (diabetes mellitus), type 2 ([place]) 02/03/2013     Prior to Admission medications   Medication Sig Start Date End Date Taking? Authorizing Provider  diazepam (VALIUM) 2 MG tablet Take 1-2 tablets (2-4 mg total) by mouth every 8 (eight) hours as needed for anxiety or muscle spasms. Patient taking differently: Take 1-2 mg by mouth every 12 (twelve) hours as needed for anxiety or muscle spasms. Take 0.5 tablet at night Take 1 tablet in the morning 02/02/16  Yes Mancel Bale, PA-C  FLUoxetine (PROZAC) 20 MG capsule Take 1 capsule (20 mg total) by mouth 3 (three) times daily. Patient taking differently: Take 20 mg by mouth 2 (two) times daily.  02/02/16  Yes Mancel Bale, PA-C  furosemide (LASIX) 20 MG tablet Take 0.5-1 tablets (10-20 mg total) by mouth daily. Patient taking differently: Take 20 mg by mouth daily.  02/02/16  Yes Mancel Bale, PA-C  HYDROcodone-acetaminophen (NORCO/VICODIN) 5-325 MG tablet Take 1 tablet by mouth every 6 (six) hours as needed for moderate pain. Patient taking differently: Take 1 tablet by mouth every 6 (six) hours as needed. For menstrual cycle pain 02/02/16  Yes Sarah Alleen Borne, PA-C  Insulin Glargine (LANTUS SOLOSTAR) 100 UNIT/ML Solostar Pen Start with 20U qhs - titrate by 2 Units every 4 days until am glucose is 120 or you reach 50 U qhs whichever is 1st Patient taking differently: Inject 23 Units into the skin at bedtime. Start with 20U qhs - titrate by 2 Units  every 4 days until am glucose is 120 or you reach 50 U qhs whichever is 1st 02/11/16  Yes Sarah Alleen Borne, PA-C  Liraglutide (VICTOZA) 18 MG/3ML SOPN INJECT 0.3MLS SUBCUTANEOUSLY DAILY 02/02/16  Yes Mancel Bale, PA-C  metoprolol tartrate (LOPRESSOR) 50 MG tablet Take 1 tablet (50 mg total) by mouth 2 (two) times daily. 02/02/16  Yes Mancel Bale, PA-C  rosuvastatin (CRESTOR) 40 MG tablet Take 1 tablet (40 mg total) by mouth daily. Patient taking differently: Take 40 mg by mouth at bedtime.  02/02/16  Yes Sarah Alleen Borne,  PA-C  cyclobenzaprine (FLEXERIL) 5 MG tablet TAKE 1 TABLET (5 MG TOTAL) BY MOUTH 3 (THREE) TIMES DAILY AS NEEDED FOR MUSCLE SPASMS. 02/02/16   Historical Provider, MD  ibuprofen (ADVIL,MOTRIN) 800 MG tablet TAKE 1 TABLET (800 MG TOTAL) BY MOUTH EVERY 8 (EIGHT) HOURS AS NEEDED. 02/02/16   Historical Provider, MD  NOVOTWIST 32G X 5 MM MISC USE 1 APPLICATION BY DOES NOT APPLY ROUTE DAILY. 02/03/16   Historical Provider, MD     Allergies  Allergen Reactions  . Darvocet [Propoxyphene N-Acetaminophen] Hives and Itching  . Lisinopril Cough  . Metformin And Related Other (See Comments)    Chest pain  . Morphine And Related Nausea And Vomiting  . Percocet [Oxycodone-Acetaminophen] Itching    And headache  . Wellbutrin [Bupropion Hcl]     Hears voices  . Zofran [Ondansetron Hcl] Itching       Objective:  Physical Exam  Constitutional: She is oriented to person, place, and time. She appears well-developed and well-nourished. She is active and cooperative. No distress.  BP 130/80 mmHg  Pulse 61  Temp(Src) 98 F (36.7 C) (Oral)  Resp 16  Ht   Wt   SpO2 97%  LMP 02/03/2016   Eyes: Conjunctivae are normal.  Pulmonary/Chest: Effort normal.  Musculoskeletal:       Right ankle: She exhibits decreased range of motion. She exhibits no swelling, no ecchymosis, no deformity, no laceration and normal pulse. Tenderness (generalized, but mostly in the lateral ankle area). Achilles tendon exhibits pain. Achilles tendon exhibits no defect.       Right foot: There is decreased range of motion, tenderness and bony tenderness. There is no swelling, normal capillary refill, no crepitus, no deformity and no laceration.  Describes the worst tenderness at the achilles and along the MTPs, terrible pain along the plantar surface of the foot with ROM of the toes, especially the great toe.   Neurological: She is alert and oriented to person, place, and time.  Psychiatric: She has a normal mood and affect. Her speech  is normal and behavior is normal.     Dg Ankle Complete Right  03/08/2016  CLINICAL DATA:  RIGHT foot/ankle pain following fall 2 hours ago; history of achilles sprain (See below for [facility] reading) EXAM: RIGHT ANKLE - COMPLETE 3+ VIEW COMPARISON:  None. FINDINGS: There is diffuse soft tissue swelling of the ankle. No evidence for acute fracture or subluxation. Small plantar and Achilles spurs are present. IMPRESSION: Soft tissue swelling. Electronically Signed   By: Nolon Nations M.D.   On: 03/08/2016 16:21   Dg Foot Complete Right  03/08/2016  CLINICAL DATA:  Right foot/ankle pain following a fall 2 hours ago, initial encounter. EXAM: RIGHT FOOT COMPLETE - 3+ VIEW COMPARISON:  None. FINDINGS: Osseous density along the posteromedial navicular is well corticated. A curvilinear ossific density adjacent to the medial aspect of the second proximal phalanx is also dense,  suggesting prior injury. There may be mild midfoot osteoarthritis. Calcaneal spurs. IMPRESSION: No acute osseous abnormality. Electronically Signed   By: Lorin Picket M.D.   On: 03/08/2016 16:23         Assessment & Plan:   1. Foot pain, right 2. Right ankle pain No fracture seen. Tall CAM walker. Rest. Anti-inflammatory. PRN hydrocodone. - DG Foot Complete Right; Future - DG Ankle Complete Right; Future - Care order/instruction  3. Type 2 diabetes mellitus with other circulatory complication, with long-term current use of insulin (HCC) Increase insulin by 2 units every 2 days until fasting glucose <120. Stop Lantus when she exhausts her current supply. Switch to Toujeo (at whatever dose she reaches when she runs out or fasting readings <120). - Insulin Glargine (TOUJEO SOLOSTAR) 300 UNIT/ML SOPN; Inject 50 Units into the skin daily. Start with 50 units QPM, increase by 2 units every 2 days until fasting glucose is below 120.  Dispense: 9 mL; Refill: 1  4. Endometriosis Stable. - HYDROcodone-acetaminophen (NORCO/VICODIN)  5-325 MG tablet; Take 1 tablet by mouth every 6 (six) hours as needed. For menstrual cycle pain  Dispense: 20 tablet; Refill: 0  5. Anxiety Stable. - diazepam (VALIUM) 2 MG tablet; Take 0.5-1 tablets (1-2 mg total) by mouth every 12 (twelve) hours as needed for anxiety or muscle spasms.  Dispense: 40 tablet; Refill: 0     Fara Chute, PA-C Physician Assistant-Certified [facility]

## 2016-03-08 NOTE — Progress Notes (Signed)
Subjective:    Patient ID: [name], female    DOB: Nov 26, 1965, 50 y.o.   MRN: [id number] Chief Complaint  Patient presents with  . Fall    tripped over cord and right foot pain up to lower leg hurt   . Medication Refill    hydrocodone, diazepam and discuss insulin     HPI   Patient is 50 yo female who presents today with diffuse pain in her right foot and ankle.  She tripped over an extension cord at 1pm today, while at a friends house about a, and fell on concrete landed on foot, throbbing diffuse pain 8/10. Pain with weight bearing, ROM of ankle, foot and toes.  History of strained achelies tendon requiring use of a walking boot.  Patient states "I feels the same as last time, I think I pulled it again."  Recently started Lantus basal insulin at night, instructed to self adjust dose by 2U every few days  up to 50 U or until glucose below 120.  She has reached 50U but her glucose readings are still above 150.  She believes she needs insulin during the day as well so she has started injecting 40U at night and 10U in the morning.    Patient would also like a refill of her medications: hydrocodone and diazepam.  Review of Systems  Respiratory: Negative for chest tightness and shortness of breath.   Cardiovascular: Negative for chest pain and palpitations.       Objective:   Physical Exam  Constitutional: She appears well-developed and well-nourished.  Cardiovascular: Normal rate, regular rhythm, normal heart sounds and intact distal pulses.   Pulmonary/Chest: Effort normal and breath sounds normal. No respiratory distress.  Musculoskeletal: Normal range of motion.       Right ankle: She exhibits swelling. Tenderness. Lateral malleolus, medial malleolus, AITFL, CF ligament, posterior TFL, head of 5th metatarsal and proximal fibula tenderness found. Achilles tendon exhibits pain.  Pain with extension, flexion, adduction and abduction.     Dg Ankle Complete  Right  03/08/2016  CLINICAL DATA:  RIGHT foot/ankle pain following fall 2 hours ago; history of achilles sprain (See below for [facility] reading) EXAM: RIGHT ANKLE - COMPLETE 3+ VIEW COMPARISON:  None. FINDINGS: There is diffuse soft tissue swelling of the ankle. No evidence for acute fracture or subluxation. Small plantar and Achilles spurs are present. IMPRESSION: Soft tissue swelling. Electronically Signed   By: Nolon Nations M.D.   On: 03/08/2016 16:21   Dg Foot Complete Right  03/08/2016  CLINICAL DATA:  Right foot/ankle pain following a fall 2 hours ago, initial encounter. EXAM: RIGHT FOOT COMPLETE - 3+ VIEW COMPARISON:  None. FINDINGS: Osseous density along the posteromedial navicular is well corticated. A curvilinear ossific density adjacent to the medial aspect of the second proximal phalanx is also dense, suggesting prior injury. There may be mild midfoot osteoarthritis. Calcaneal spurs. IMPRESSION: No acute osseous abnormality. Electronically Signed   By: Lorin Picket M.D.   On: 03/08/2016 16:23       Assessment & Plan:  1. Foot pain, right 2. Right ankle pain - DG Foot Complete Right; Future - DG Ankle Complete Right; Future - Care order/instruction Imaging indicates no acute abnormality. Injury likely caused a sprain or strain, patient given CAM walker and wrapped with ACE bandage for additional support.  Instructed patient on CAM walker use and importance of rest, ice, compression and elevation.   NSAID use and hydrocodone for pain.  3. Type 2  diabetes mellitus with other circulatory complication, with long-term current use of insulin ([place]) Explained to patient the type of insulin she has been using last 24hrs so the should not be split for maximum efficiency.   Discussed treatment with PCP, instructed patient to continue using her remaining Lantus, increasing dosage by 2U every 2 days until glucose <120.   Starting Toujeo insulin.  Patient instructed to start with same units she  finishes Lantus with.  Patient provided education and information about new insulin.  - Insulin Glargine (TOUJEO SOLOSTAR) 300 UNIT/ML SOPN; Inject 50 Units into the skin daily. Start with 50 units QPM, increase by 2 units every 2 days until fasting glucose is below 120.  Dispense: 9 mL; Refill: 1  4. Endometriosis Stable, pain well controlled with current regimen, refilled hydrocodone. - HYDROcodone-acetaminophen (NORCO/VICODIN) 5-325 MG tablet; Take 1 tablet by mouth every 6 (six) hours as needed. For menstrual cycle pain  Dispense: 20 tablet; Refill: 0  5. Anxiety 6. Insomnia Stable, well controlled on current regimen.  Refilled diazepam. - diazepam (VALIUM) 2 MG tablet; Take 0.5-1 tablets (1-2 mg total) by mouth every 12 (twelve) hours as needed for anxiety or muscle spasms.  Dispense: 40 tablet; Refill: 0  Patient instructed to follow up in 2-4 weeks with PCP for medication management.  Please contact office if any questions or concerns arise.  Reda Gettis P. Gorje Iyer, PA-S

## 2016-03-10 NOTE — Telephone Encounter (Signed)
Pt came in to be seen and this has been done.

## 2016-03-12 ENCOUNTER — Other Ambulatory Visit: Payer: Self-pay | Admitting: Physician Assistant

## 2016-04-05 ENCOUNTER — Emergency Department (HOSPITAL_BASED_OUTPATIENT_CLINIC_OR_DEPARTMENT_OTHER): Payer: BLUE CROSS/BLUE SHIELD

## 2016-04-05 ENCOUNTER — Emergency Department (HOSPITAL_BASED_OUTPATIENT_CLINIC_OR_DEPARTMENT_OTHER)
Admission: EM | Admit: 2016-04-05 | Discharge: 2016-04-05 | Disposition: A | Discharge status: Home / Self Care | Payer: BLUE CROSS/BLUE SHIELD | Attending: Emergency Medicine | Admitting: Emergency Medicine

## 2016-04-05 ENCOUNTER — Encounter (HOSPITAL_BASED_OUTPATIENT_CLINIC_OR_DEPARTMENT_OTHER): Payer: Self-pay

## 2016-04-05 DIAGNOSIS — Z794 Long term (current) use of insulin: Secondary | ICD-10-CM | POA: Diagnosis not present

## 2016-04-05 DIAGNOSIS — Z6841 Body Mass Index (BMI) 40.0 and over, adult: Secondary | ICD-10-CM | POA: Insufficient documentation

## 2016-04-05 DIAGNOSIS — R110 Nausea: Secondary | ICD-10-CM | POA: Diagnosis not present

## 2016-04-05 DIAGNOSIS — M1990 Unspecified osteoarthritis, unspecified site: Secondary | ICD-10-CM | POA: Insufficient documentation

## 2016-04-05 DIAGNOSIS — R109 Unspecified abdominal pain: Secondary | ICD-10-CM | POA: Diagnosis not present

## 2016-04-05 DIAGNOSIS — F17210 Nicotine dependence, cigarettes, uncomplicated: Secondary | ICD-10-CM | POA: Diagnosis not present

## 2016-04-05 DIAGNOSIS — Z79899 Other long term (current) drug therapy: Secondary | ICD-10-CM | POA: Diagnosis not present

## 2016-04-05 DIAGNOSIS — M545 Low back pain, unspecified: Secondary | ICD-10-CM

## 2016-04-05 DIAGNOSIS — I10 Essential (primary) hypertension: Secondary | ICD-10-CM | POA: Insufficient documentation

## 2016-04-05 DIAGNOSIS — E119 Type 2 diabetes mellitus without complications: Secondary | ICD-10-CM | POA: Insufficient documentation

## 2016-04-05 LAB — URINALYSIS, ROUTINE W REFLEX MICROSCOPIC
BILIRUBIN URINE: NEGATIVE
Glucose, UA: NEGATIVE mg/dL
HGB URINE DIPSTICK: NEGATIVE
KETONES UR: NEGATIVE mg/dL
Leukocytes, UA: NEGATIVE
NITRITE: NEGATIVE
Protein, ur: NEGATIVE mg/dL
SPECIFIC GRAVITY, URINE: 1.023 (ref 1.005–1.030)
pH: 6 (ref 5.0–8.0)

## 2016-04-05 LAB — COMPREHENSIVE METABOLIC PANEL
ALBUMIN: 3.7 g/dL (ref 3.5–5.0)
ALT: 28 U/L (ref 14–54)
ANION GAP: 8 (ref 5–15)
AST: 19 U/L (ref 15–41)
Alkaline Phosphatase: 75 U/L (ref 38–126)
BUN: 19 mg/dL (ref 6–20)
CHLORIDE: 107 mmol/L (ref 101–111)
CO2: 22 mmol/L (ref 22–32)
Calcium: 9.1 mg/dL (ref 8.9–10.3)
Creatinine, Ser: 0.73 mg/dL (ref 0.44–1.00)
GFR calc non Af Amer: >60 mL/min (ref >60)
Glucose, Bld: 122 mg/dL — ABNORMAL HIGH (ref 65–99)
POTASSIUM: 3.4 mmol/L — AB (ref 3.5–5.1)
SODIUM: 137 mmol/L (ref 135–145)
Total Bilirubin: 0.6 mg/dL (ref 0.3–1.2)
Total Protein: 6.8 g/dL (ref 6.5–8.1)

## 2016-04-05 LAB — CBC WITH DIFFERENTIAL/PLATELET
BASOS PCT: 1 %
Basophils Absolute: 0.1 10*3/uL (ref 0.0–0.1)
EOS ABS: 0.2 10*3/uL (ref 0.0–0.7)
EOS PCT: 2 %
HCT: 40.3 % (ref 36.0–46.0)
Hemoglobin: 14 g/dL (ref 12.0–15.0)
LYMPHS ABS: 4.2 10*3/uL — AB (ref 0.7–4.0)
Lymphocytes Relative: 34 %
MCH: 31 pg (ref 26.0–34.0)
MCHC: 34.7 g/dL (ref 30.0–36.0)
MCV: 89.2 fL (ref 78.0–100.0)
MONOS PCT: 6 %
Monocytes Absolute: 0.8 10*3/uL (ref 0.1–1.0)
Neutro Abs: 7 10*3/uL (ref 1.7–7.7)
Neutrophils Relative %: 57 %
PLATELETS: 281 10*3/uL (ref 150–400)
RBC: 4.52 MIL/uL (ref 3.87–5.11)
RDW: 13.6 % (ref 11.5–15.5)
WBC: 12.2 10*3/uL — AB (ref 4.0–10.5)

## 2016-04-05 LAB — PREGNANCY, URINE: Preg Test, Ur: NEGATIVE

## 2016-04-05 LAB — LIPASE, BLOOD: LIPASE: 68 U/L — AB (ref 11–51)

## 2016-04-05 MED ORDER — PROMETHAZINE HCL 25 MG/ML IJ SOLN: 12.5000 mg | Freq: Once | INTRAMUSCULAR | Status: DC

## 2016-04-05 MED ORDER — KETOROLAC TROMETHAMINE 30 MG/ML IJ SOLN
30.0000 mg | Freq: Once | INTRAMUSCULAR | Status: AC
Start: 2016-04-05 — End: 2016-04-05
  Administered 2016-04-05: 30 mg via INTRAVENOUS
  Filled 2016-04-05: qty 1

## 2016-04-05 MED ORDER — PROMETHAZINE HCL 25 MG/ML IJ SOLN
12.5000 mg | Freq: Once | INTRAMUSCULAR | Status: AC
  Administered 2016-04-05: 12.5 mg via INTRAVENOUS
  Filled 2016-04-05: qty 1

## 2016-04-05 MED ORDER — HYDROMORPHONE HCL 1 MG/ML IJ SOLN
0.5000 mg | Freq: Once | INTRAMUSCULAR | Status: AC
  Administered 2016-04-05: 0.5 mg via INTRAVENOUS
  Filled 2016-04-05: qty 1

## 2016-04-05 NOTE — ED Provider Notes (Signed)
CSN: [id number]     Arrival date & time 04/05/16  2015 History  By signing my name below, I, Georgette Shell, attest that this documentation has been prepared under the direction and in the presence of Julianne Rice, MD. Electronically Signed: Georgette Shell, ED Scribe. 04/05/2016. 9:00 PM.   Chief Complaint  Patient presents with  . Flank Pain    The history is provided by the patient. No language interpreter was used.    HPI Comments: [name] is a 50 y.o. female who presents to the Emergency Department complaining of sudden onset, intermittent, right flank pain onset two hours ago. Pt also has diaphoresis and nausea secondary to the pain. Pt states it started at her back and then moved to her right flank. Pt has h/o kidney stones and states that this feels like one. No dysuria or hematuria. Pt took Ibuprofen with no relief. Pt denies vomiting, leg pain, and dysuria. No numbness or weakness. No fever or chills.   Past Medical History  Diagnosis Date  . Diabetes mellitus   . Endometriosis   . Hypertension   . Morbid obesity ([place])   . Arthritis   . Anxiety   . Obstructive sleep apnea     CPAP NIGHTLY; sleep study 2007.  Marland Kitchen Renal disorder     kidney stone  . Hypersomnia, persistent     epworth 16   . Obesity, Class III, BMI 40-49.9 (morbid obesity) ([place])   . Lyme borreliosis   . Migraine    Past Surgical History  Procedure Laterality Date  . Cholecystectomy    . Knee surgery    . Knee arthrocentesis    . Tubal ligation    . Ablation on endometriosis    . Ablation on endometriosis      10 years ago  . Cardiac catheterization N/A 02/20/2016    Procedure: Left Heart Cath and Coronary Angiography;  Surgeon: [name], MD;  Location: [facility];  Service: Cardiovascular;  Laterality: N/A;   Family History  Problem Relation Age of Onset  . Diabetes Mother   . Thyroid disease Mother   . Depression Mother     with anxiety  . Stroke Mother   . Diabetes Father   .  Hypertension Father   . Diabetes Sister   . Hypertension Sister   . Hypertension Brother    Social History  Substance Use Topics  . Smoking status: Current Every Day Smoker -- 0.20 packs/day for 0 years    Types: Cigarettes  . Smokeless tobacco: Never Used  . Alcohol Use: No   OB History    No data available     Review of Systems  Constitutional: Negative for fever and chills.  Eyes: Negative for visual disturbance.  Respiratory: Negative for shortness of breath.   Cardiovascular: Negative for chest pain.  Gastrointestinal: Positive for nausea. Negative for vomiting, abdominal pain, diarrhea and constipation.  Genitourinary: Positive for flank pain. Negative for dysuria, frequency, hematuria and pelvic pain.  Musculoskeletal: Positive for myalgias and back pain. Negative for neck pain and neck stiffness.  Skin: Negative for rash and wound.  Neurological: Negative for dizziness, weakness, light-headedness, numbness and headaches.  All other systems reviewed and are negative.     Allergies  Darvocet; Lisinopril; Metformin and related; Morphine and related; Percocet; Wellbutrin; and Zofran  Home Medications   Prior to Admission medications   Medication Sig Start Date End Date Taking? Authorizing Provider  cyclobenzaprine (FLEXERIL) 5 MG tablet TAKE 1  TABLET (5 MG TOTAL) BY MOUTH 3 (THREE) TIMES DAILY AS NEEDED FOR MUSCLE SPASMS. 02/02/16   Historical Provider, MD  diazepam (VALIUM) 2 MG tablet Take 0.5-1 tablets (1-2 mg total) by mouth every 12 (twelve) hours as needed for anxiety or muscle spasms. 03/08/16   Chelle Jeffery, PA-C  FLUoxetine (PROZAC) 20 MG capsule Take 1 capsule (20 mg total) by mouth 3 (three) times daily. Patient taking differently: Take 20 mg by mouth 2 (two) times daily.  02/02/16   Mancel Bale, PA-C  furosemide (LASIX) 20 MG tablet Take 0.5-1 tablets (10-20 mg total) by mouth daily. Patient taking differently: Take 20 mg by mouth daily.  02/02/16   Mancel Bale, PA-C  HYDROcodone-acetaminophen (NORCO/VICODIN) 5-325 MG tablet Take 1 tablet by mouth every 6 (six) hours as needed. For menstrual cycle pain 03/08/16   Chelle Jeffery, PA-C  ibuprofen (ADVIL,MOTRIN) 800 MG tablet TAKE 1 TABLET (800 MG TOTAL) BY MOUTH EVERY 8 (EIGHT) HOURS AS NEEDED. 02/02/16   Historical Provider, MD  Insulin Glargine (TOUJEO SOLOSTAR) 300 UNIT/ML SOPN Inject 50 Units into the skin daily. Start with 50 units QPM, increase by 2 units every 2 days until fasting glucose is below 120. 03/08/16   Chelle Jeffery, PA-C  Liraglutide (VICTOZA) 18 MG/3ML SOPN INJECT 0.3MLS SUBCUTANEOUSLY DAILY 02/02/16   Mancel Bale, PA-C  metoprolol tartrate (LOPRESSOR) 50 MG tablet Take 1 tablet (50 mg total) by mouth 2 (two) times daily. 02/02/16   Mancel Bale, PA-C  NOVOTWIST 32G X 5 MM MISC USE 1 APPLICATION BY DOES NOT APPLY ROUTE DAILY. 02/03/16   Historical Provider, MD  rosuvastatin (CRESTOR) 40 MG tablet Take 1 tablet (40 mg total) by mouth daily. Patient taking differently: Take 40 mg by mouth at bedtime.  02/02/16   Mancel Bale, PA-C   BP 170/91 mmHg  Pulse 68  Temp(Src) 99.4 F (37.4 C) (Oral)  Resp 20  Ht 5\' 1"  (1.549 m)  Wt 250 lb (113.399 kg)  BMI 47.26 kg/m2  SpO2 99%  LMP 03/26/2016 Physical Exam  Constitutional: She is oriented to person, place, and time. She appears well-developed and well-nourished. No distress.  HENT:  Head: Normocephalic and atraumatic.  Mouth/Throat: Oropharynx is clear and moist. No oropharyngeal exudate.  Eyes: EOM are normal. Pupils are equal, round, and reactive to light.  Neck: Normal range of motion. Neck supple.  Cardiovascular: Normal rate and regular rhythm.  Exam reveals no gallop and no friction rub.   No murmur heard. Pulmonary/Chest: Effort normal and breath sounds normal. No respiratory distress. She has no wheezes. She has no rales. She exhibits no tenderness.  Abdominal: Soft. Bowel sounds are normal. She exhibits no distension and no  mass. There is tenderness (very mild right-sided abdominal tenderness without focality, rebound or guarding.). There is no rebound and no guarding.  Musculoskeletal: Normal range of motion. She exhibits tenderness. She exhibits no edema.  Patient with right paraspinal lumbar tenderness to palpation. No definite CVA tenderness bilaterally. Negative straight leg raise. No lower extremity asymmetry or tenderness. Distal pulses are equal and intact.  Neurological: She is alert and oriented to person, place, and time.  5/5 motor in all extremities. Sensation is fully intact.  Skin: Skin is warm and dry. No rash noted. No erythema.  Psychiatric: She has a normal mood and affect. Her behavior is normal.  Nursing note and vitals reviewed.   ED Course  Procedures  DIAGNOSTIC STUDIES: Oxygen Saturation is 99% on RA, normal  by my interpretation.    COORDINATION OF CARE: 9:00 PM Discussed treatment plan with pt at bedside which includes urinalysis and pt agreed to plan.  Labs Review Labs Reviewed  CBC WITH DIFFERENTIAL/PLATELET - Abnormal; Notable for the following:    WBC 12.2 (*)    Lymphs Abs 4.2 (*)    All other components within normal limits  COMPREHENSIVE METABOLIC PANEL - Abnormal; Notable for the following:    Potassium 3.4 (*)    Glucose, Bld 122 (*)    All other components within normal limits  LIPASE, BLOOD - Abnormal; Notable for the following:    Lipase 68 (*)    All other components within normal limits  PREGNANCY, URINE  URINALYSIS, ROUTINE W REFLEX MICROSCOPIC (NOT AT [facility])   Imaging Review Ct Renal Stone Study  04/05/2016  CLINICAL DATA:  Severe right flank pain with history of renal stones. No hematuria. EXAM: CT ABDOMEN AND PELVIS WITHOUT CONTRAST TECHNIQUE: Multidetector CT imaging of the abdomen and pelvis was performed following the standard protocol without IV contrast. COMPARISON:  04/19/2013 FINDINGS: The lung bases are clear. Kidneys are symmetrical in size and  shape. No hydronephrosis or hydroureter. No renal, ureteral, or bladder stones. Bladder is decompressed. Surgical absence of the gallbladder. No bile duct dilatation. The unenhanced appearance of the liver, spleen, pancreas, adrenal glands, abdominal aorta, inferior vena cava, and retroperitoneal lymph nodes is unremarkable. Stomach, small bowel, and colon are not abnormally distended. Scattered diverticula throughout the colon without inflammatory changes. No free air or free fluid in the abdomen. Umbilical hernia containing fat. Pelvis: Uterus and ovaries are not enlarged. Small fibroid arising from the right uterine dome. No pelvic lymphadenopathy. No free or loculated pelvic fluid collections. Non rotation of colon. An appendiceal stump is visualized which appears normal. IMPRESSION: No renal or ureteral stone or obstruction. No acute process demonstrated in the abdomen or pelvis. No evidence of bowel obstruction or inflammation. Non rotated colon with diverticulosis. No diverticulitis. Small umbilical hernia containing fat. Electronically Signed   By: Lucienne Capers M.D.   On: 04/05/2016 21:55   I have personally reviewed and evaluated these images and lab results as part of my medical decision-making.  MDM   Final diagnoses:  Right-sided low back pain without sciatica   I personally performed the services described in this documentation, which was scribed in my presence. The recorded information has been reviewed and is accurate.   No evidence of any abnormal findings and the CT. Patient has a missing gallbladder and appendix. No biliary duct dilation. Patient has a mild elevation in her lipase of uncertain significance. This is on previous labs seen before. Her pain is well-controlled. We'll discharge home to follow-up with her primary physician. Return precautions have been given.  Julianne Rice, MD 04/05/16 2253

## 2016-04-05 NOTE — ED Notes (Signed)
Pt unable to give UA at this time 

## 2016-04-05 NOTE — ED Notes (Signed)
C/o right flank pain x 1 hour-states feels like kidney stone-grimacing-steady gait

## 2016-04-05 NOTE — ED Notes (Signed)
Pt reports nausea and dry heaves noted Dr Robbie Louis notified and order for phenergan 12.5 mg obtained.

## 2016-04-05 NOTE — ED Notes (Signed)
Patient transported to CT 

## 2016-04-05 NOTE — Discharge Instructions (Signed)

## 2016-04-15 ENCOUNTER — Other Ambulatory Visit: Payer: Self-pay | Admitting: Physician Assistant

## 2016-04-15 ENCOUNTER — Telehealth: Payer: Self-pay

## 2016-04-15 DIAGNOSIS — G4700 Insomnia, unspecified: Secondary | ICD-10-CM

## 2016-04-15 DIAGNOSIS — N809 Endometriosis, unspecified: Secondary | ICD-10-CM

## 2016-04-15 DIAGNOSIS — S39012A Strain of muscle, fascia and tendon of lower back, initial encounter: Secondary | ICD-10-CM

## 2016-04-15 DIAGNOSIS — E119 Type 2 diabetes mellitus without complications: Secondary | ICD-10-CM

## 2016-04-15 NOTE — Telephone Encounter (Signed)
fax req for Victoza 3-pk 18mg /58ml pen-sent to [facility]

## 2016-04-15 NOTE — Telephone Encounter (Signed)
Advise on controlled meds

## 2016-04-15 NOTE — Telephone Encounter (Signed)
[name] called saying she's completely out of the following medications and needs a refill: Vicodin, Valium, Celebrex Please give her a phone call if needed.  Pt's ph# [phone] Thank you.

## 2016-04-16 MED ORDER — HYDROCODONE-ACETAMINOPHEN 5-325 MG PO TABS: 1.0000 | ORAL_TABLET | Freq: Four times a day (QID) | ORAL | Status: DC | PRN

## 2016-04-16 MED ORDER — DIAZEPAM 2 MG PO TABS: 1.0000 mg | ORAL_TABLET | Freq: Two times a day (BID) | ORAL | Status: DC | PRN

## 2016-04-16 NOTE — Telephone Encounter (Signed)
On Ms. Horris Latino behalf. Patient due for follow-up with Ms. Weber next month.  Meds ordered this encounter  Medications  . HYDROcodone-acetaminophen (NORCO/VICODIN) 5-325 MG tablet    Sig: Take 1 tablet by mouth every 6 (six) hours as needed. For menstrual cycle pain    Dispense:  20 tablet    Refill:  0    Order Specific Question:  Supervising Provider    Answer:  SHAW, EVA N [[id number]]  . diazepam (VALIUM) 2 MG tablet    Sig: Take 0.5-1 tablets (1-2 mg total) by mouth every 12 (twelve) hours as needed for anxiety or muscle spasms.    Dispense:  40 tablet    Refill:  0    Order Specific Question:  Supervising Provider    Answer:  Brigitte Pulse, EVA N [[id number]]

## 2016-04-19 MED ORDER — LIRAGLUTIDE 18 MG/3ML ~~LOC~~ SOPN: PEN_INJECTOR | SUBCUTANEOUS | 0 refills | Status: DC

## 2016-04-19 NOTE — Telephone Encounter (Signed)
Done

## 2016-04-19 NOTE — Telephone Encounter (Signed)
Called and advised pt that hydrocodone and diazepam are ready for p/up. Asked pt about celecoxib because it was not on pt's current med list, and I see that Judson Roch had pt taking ibuprofen at last OV. Pt stated that the hosp had taken it off of her list, but she does still need it for her menstrual pain, reporting that the ibuprofen is not helping that. Sarah please advise if we can RF that. Pt agrees to f/up next month and stated that the new Toujeo is working much better, but she is up to 68 units and wanted to make sure she can continue to increase as instr'd. Pt stated that she is very close to the goal. I advised to cont to inc as instr'd and that I will send a message to Judson Roch and call back if she does not want her to increase any further. Pt verbalized understanding. Pt will just check w/pharm about celebrex and we only need to CB if problem.

## 2016-04-21 MED ORDER — CELECOXIB 200 MG PO CAPS: 200.0000 mg | ORAL_CAPSULE | Freq: Every day | ORAL | 0 refills | Status: DC

## 2016-04-21 NOTE — Telephone Encounter (Signed)
Celebrex is ok -  Yes she is ok to continue the titration of insulin up

## 2016-05-18 ENCOUNTER — Encounter (HOSPITAL_BASED_OUTPATIENT_CLINIC_OR_DEPARTMENT_OTHER): Payer: Self-pay

## 2016-05-18 ENCOUNTER — Emergency Department (HOSPITAL_BASED_OUTPATIENT_CLINIC_OR_DEPARTMENT_OTHER)
Admission: EM | Admit: 2016-05-18 | Discharge: 2016-05-18 | Disposition: A | Discharge status: Home / Self Care | Payer: BLUE CROSS/BLUE SHIELD | Attending: Emergency Medicine | Admitting: Emergency Medicine

## 2016-05-18 DIAGNOSIS — Z794 Long term (current) use of insulin: Secondary | ICD-10-CM | POA: Diagnosis not present

## 2016-05-18 DIAGNOSIS — Y929 Unspecified place or not applicable: Secondary | ICD-10-CM | POA: Diagnosis not present

## 2016-05-18 DIAGNOSIS — I10 Essential (primary) hypertension: Secondary | ICD-10-CM | POA: Insufficient documentation

## 2016-05-18 DIAGNOSIS — T148XXA Other injury of unspecified body region, initial encounter: Secondary | ICD-10-CM

## 2016-05-18 DIAGNOSIS — Z79899 Other long term (current) drug therapy: Secondary | ICD-10-CM | POA: Diagnosis not present

## 2016-05-18 DIAGNOSIS — Y93F2 Activity, caregiving, lifting: Secondary | ICD-10-CM | POA: Diagnosis not present

## 2016-05-18 DIAGNOSIS — X500XXA Overexertion from strenuous movement or load, initial encounter: Secondary | ICD-10-CM | POA: Insufficient documentation

## 2016-05-18 DIAGNOSIS — Y999 Unspecified external cause status: Secondary | ICD-10-CM | POA: Insufficient documentation

## 2016-05-18 DIAGNOSIS — M62830 Muscle spasm of back: Secondary | ICD-10-CM

## 2016-05-18 DIAGNOSIS — S39012A Strain of muscle, fascia and tendon of lower back, initial encounter: Secondary | ICD-10-CM | POA: Diagnosis not present

## 2016-05-18 DIAGNOSIS — F17210 Nicotine dependence, cigarettes, uncomplicated: Secondary | ICD-10-CM | POA: Insufficient documentation

## 2016-05-18 DIAGNOSIS — S3992XA Unspecified injury of lower back, initial encounter: Secondary | ICD-10-CM | POA: Diagnosis present

## 2016-05-18 DIAGNOSIS — E119 Type 2 diabetes mellitus without complications: Secondary | ICD-10-CM | POA: Insufficient documentation

## 2016-05-18 DIAGNOSIS — M545 Low back pain: Secondary | ICD-10-CM

## 2016-05-18 MED ORDER — DIAZEPAM 5 MG/ML IJ SOLN
5.0000 mg | Freq: Once | INTRAMUSCULAR | Status: AC
  Administered 2016-05-18: 5 mg via INTRAMUSCULAR
  Filled 2016-05-18: qty 2

## 2016-05-18 MED ORDER — KETOROLAC TROMETHAMINE 30 MG/ML IJ SOLN
30.0000 mg | Freq: Once | INTRAMUSCULAR | Status: AC
Start: 2016-05-18 — End: 2016-05-18
  Administered 2016-05-18: 30 mg via INTRAMUSCULAR
  Filled 2016-05-18: qty 1

## 2016-05-18 NOTE — ED Triage Notes (Signed)
C/o felt a pop to lower back when moving furniture approx 37min PTA-slow steady gait

## 2016-05-18 NOTE — Discharge Instructions (Signed)
Please continue with ibuprofen, ice, heat, gentle stretching. Do not do any heavy lifting or twisting. It is important that you follow-up with your doctor or with an orthopedic doctor. You may need to have an MRI at your back to rule out disc injury.

## 2016-05-18 NOTE — ED Provider Notes (Signed)
[place] DEPT [facility] Provider Note   CSN: [id number] Arrival date & time: 05/18/16  1117     History   Chief Complaint Chief Complaint  Patient presents with  . Back Pain    HPI [name] is a 50 y.o. female.  Patient presents to the emergency department with chief complaint of low back pain. She states that she was lifting a dresser occipitally 30 minutes prior to arrival, when she felt severe pain in her low back. She states that it radiates to her right lower extremity. She denies any lower extremity weakness. Denies new bowel or bladder incontinence.  Her symptoms are worsened with movement and palpation. She has not taken anything for symptoms. She has a history of low back pain.   The history is provided by the patient. No language interpreter was used.    Past Medical History:  Diagnosis Date  . Anxiety   . Arthritis   . Diabetes mellitus   . Endometriosis   . Hypersomnia, persistent    epworth 16   . Hypertension   . Lyme borreliosis   . Migraine   . Morbid obesity ([place])   . Obesity, Class III, BMI 40-49.9 (morbid obesity) ([place])   . Obstructive sleep apnea    CPAP NIGHTLY; sleep study 2007.  Marland Kitchen Renal disorder    kidney stone    Patient Active Problem List   Diagnosis Date Noted  . Anxiety 03/08/2016  . Chest pain 02/20/2016  . Leukocytosis 02/20/2016  . Vitamin D insufficiency 09/25/2015  . Endometriosis 01/17/2014  . Sleep apnea with use of continuous positive airway pressure (CPAP) 06/27/2013  . Nocturia 06/27/2013  . Obesity, Class III, BMI 40-49.9 (morbid obesity) ([place])   . Obstructive sleep apnea (adult) (pediatric) 02/15/2013  . Hypersomnia, persistent   . HTN (hypertension) 02/03/2013  . DM (diabetes mellitus), type 2 ([place]) 02/03/2013    Past Surgical History:  Procedure Laterality Date  . ABLATION ON ENDOMETRIOSIS    . ABLATION ON ENDOMETRIOSIS     10 years ago  . CARDIAC CATHETERIZATION N/A 02/20/2016   Procedure: Left Heart  Cath and Coronary Angiography;  Surgeon: [name], MD;  Location: [place] [facility];  Service: Cardiovascular;  Laterality: N/A;  . CHOLECYSTECTOMY    . KNEE ARTHROCENTESIS    . KNEE SURGERY    . TUBAL LIGATION      OB History    No data available       Home Medications    Prior to Admission medications   Medication Sig Start Date End Date Taking? Authorizing Provider  celecoxib (CELEBREX) 200 MG capsule Take 1 capsule (200 mg total) by mouth daily. 04/21/16   Mancel Bale, PA-C  cyclobenzaprine (FLEXERIL) 5 MG tablet TAKE 1 TABLET (5 MG TOTAL) BY MOUTH 3 (THREE) TIMES DAILY AS NEEDED FOR MUSCLE SPASMS. 02/02/16   Historical Provider, MD  diazepam (VALIUM) 2 MG tablet Take 0.5-1 tablets (1-2 mg total) by mouth every 12 (twelve) hours as needed for anxiety or muscle spasms. 04/16/16   Chelle Jeffery, PA-C  FLUoxetine (PROZAC) 20 MG capsule Take 1 capsule (20 mg total) by mouth 3 (three) times daily. Patient taking differently: Take 20 mg by mouth 2 (two) times daily.  02/02/16   Mancel Bale, PA-C  furosemide (LASIX) 20 MG tablet Take 0.5-1 tablets (10-20 mg total) by mouth daily. Patient taking differently: Take 20 mg by mouth daily.  02/02/16   Mancel Bale, PA-C  ibuprofen (ADVIL,MOTRIN) 800 MG  tablet TAKE 1 TABLET (800 MG TOTAL) BY MOUTH EVERY 8 (EIGHT) HOURS AS NEEDED. 02/02/16   Historical Provider, MD  Insulin Glargine (TOUJEO SOLOSTAR) 300 UNIT/ML SOPN Inject 50 Units into the skin daily. Start with 50 units QPM, increase by 2 units every 2 days until fasting glucose is below 120. 03/08/16   Chelle Jeffery, PA-C  Liraglutide (VICTOZA) 18 MG/3ML SOPN INJECT 0.3MLS SUBCUTANEOUSLY DAILY 04/19/16   Mancel Bale, PA-C  metoprolol tartrate (LOPRESSOR) 50 MG tablet Take 1 tablet (50 mg total) by mouth 2 (two) times daily. 02/02/16   Mancel Bale, PA-C  NOVOTWIST 32G X 5 MM MISC USE 1 APPLICATION BY DOES NOT APPLY ROUTE DAILY. 02/03/16   Historical Provider, MD  rosuvastatin (CRESTOR) 40 MG  tablet Take 1 tablet (40 mg total) by mouth daily. Patient taking differently: Take 40 mg by mouth at bedtime.  02/02/16   Mancel Bale, PA-C    Family History Family History  Problem Relation Age of Onset  . Diabetes Mother   . Thyroid disease Mother   . Depression Mother     with anxiety  . Stroke Mother   . Diabetes Father   . Hypertension Father   . Diabetes Sister   . Hypertension Sister   . Hypertension Brother     Social History Social History  Substance Use Topics  . Smoking status: Current Every Day Smoker    Packs/day: 0.20    Years: 0.00    Types: Cigarettes  . Smokeless tobacco: Never Used  . Alcohol use No     Allergies   Darvocet [propoxyphene n-acetaminophen]; Lisinopril; Metformin and related; Morphine and related; Percocet [oxycodone-acetaminophen]; Wellbutrin [bupropion hcl]; and Zofran [ondansetron hcl]   Review of Systems Review of Systems  Constitutional: Negative for chills and fever.  Gastrointestinal:       No bowel incontinence  Genitourinary:       No urinary incontinence  Musculoskeletal: Positive for arthralgias, back pain and myalgias.  Neurological:       No saddle anesthesia     Physical Exam Updated Vital Signs BP 170/77 (BP Location: Left Arm)   Pulse 88   Temp 99 F (37.2 C) (Oral)   Resp 20   Ht 5\' 1"  (1.549 m)   Wt 110.2 kg   LMP 04/14/2016   SpO2 98%   BMI 45.91 kg/m   Physical Exam Physical Exam  Constitutional: Pt appears well-developed and well-nourished. No distress.  HENT:  Head: Normocephalic and atraumatic.  Mouth/Throat: Oropharynx is clear and moist. No oropharyngeal exudate.  Eyes: Conjunctivae are normal.  Neck: Normal range of motion. Neck supple.  No meningismus Cardiovascular: Normal rate, regular rhythm and intact distal pulses.   Pulmonary/Chest: Effort normal and breath sounds normal. No respiratory distress. Pt has no wheezes.  Abdominal: Pt exhibits no distension Musculoskeletal:  Lumbar  paraspinal muscles tender to palpation, no bony CTLS spine tenderness, deformity, step-off, or crepitus Lymphadenopathy: Pt has no cervical adenopathy.  Neurological: Pt is alert and oriented Speech is clear and goal oriented, follows commands Normal 5/5 strength in upper and lower extremities bilaterally including dorsiflexion and plantar flexion, strong and equal grip strength Sensation intact Great toe extension intact Moves extremities without ataxia, coordination intact Ankle and knee jerk reflexes intact and symmetrical  Normal gait Normal balance No Clonus Skin: Skin is warm and dry. No rash noted. Pt is not diaphoretic. No erythema.  Psychiatric: Pt has a normal mood and affect. Behavior is normal.  Nursing  note and vitals reviewed.   ED Treatments / Results  Labs (all labs ordered are listed, but only abnormal results are displayed) Labs Reviewed - No data to display  EKG  EKG Interpretation None       Radiology No results found.  Procedures Procedures (including critical care time)  Medications Ordered in ED Medications - No data to display   Initial Impression / Assessment and Plan / ED Course  I have reviewed the triage vital signs and the nursing notes.  Pertinent labs & imaging results that were available during my care of the patient were reviewed by me and considered in my medical decision making (see chart for details).  Clinical Course    Patient with back pain.  No neurological deficits and normal neuro exam.  Patient is ambulatory.  No loss of bowel or bladder control.  Doubt cauda equina.  Denies fever,  doubt epidural abscess or other lesion. Recommend back exercises, stretching, RICE, and will treat with a short course of robaxin.   Encouraged the patient that there could be a need for additional workup and/or imaging such as MRI, if the symptoms do not resolve. Patient advised that if the back pain does not resolve, or radiates, this could  progress to more serious conditions and is encouraged to follow-up with PCP or orthopedics within 2 weeks.     Final Clinical Impressions(s) / ED Diagnoses   Final diagnoses:  Muscle spasm of back  Bilateral low back pain, with sciatica presence unspecified  Muscle strain    New Prescriptions New Prescriptions   No medications on file     Montine Circle, PA-C 05/18/16 [place], MD 05/19/16 [phone]

## 2016-06-01 ENCOUNTER — Ambulatory Visit (INDEPENDENT_AMBULATORY_CARE_PROVIDER_SITE_OTHER): Payer: BLUE CROSS/BLUE SHIELD | Admitting: Physician Assistant

## 2016-06-01 VITALS — BP 132/80 | HR 67 | Temp 98.5°F | Resp 18 | Ht 61.0 in | Wt 256.0 lb

## 2016-06-01 DIAGNOSIS — R600 Localized edema: Secondary | ICD-10-CM | POA: Diagnosis not present

## 2016-06-01 DIAGNOSIS — G4700 Insomnia, unspecified: Secondary | ICD-10-CM | POA: Diagnosis not present

## 2016-06-01 DIAGNOSIS — Z794 Long term (current) use of insulin: Secondary | ICD-10-CM

## 2016-06-01 DIAGNOSIS — G4733 Obstructive sleep apnea (adult) (pediatric): Secondary | ICD-10-CM

## 2016-06-01 DIAGNOSIS — J309 Allergic rhinitis, unspecified: Secondary | ICD-10-CM

## 2016-06-01 DIAGNOSIS — G4730 Sleep apnea, unspecified: Secondary | ICD-10-CM

## 2016-06-01 DIAGNOSIS — D179 Benign lipomatous neoplasm, unspecified: Secondary | ICD-10-CM

## 2016-06-01 DIAGNOSIS — S39012A Strain of muscle, fascia and tendon of lower back, initial encounter: Secondary | ICD-10-CM | POA: Diagnosis not present

## 2016-06-01 DIAGNOSIS — E1159 Type 2 diabetes mellitus with other circulatory complications: Secondary | ICD-10-CM | POA: Diagnosis not present

## 2016-06-01 DIAGNOSIS — F419 Anxiety disorder, unspecified: Secondary | ICD-10-CM

## 2016-06-01 DIAGNOSIS — J0110 Acute frontal sinusitis, unspecified: Secondary | ICD-10-CM

## 2016-06-01 DIAGNOSIS — F32A Depression, unspecified: Secondary | ICD-10-CM

## 2016-06-01 DIAGNOSIS — F329 Major depressive disorder, single episode, unspecified: Secondary | ICD-10-CM

## 2016-06-01 DIAGNOSIS — F430 Acute stress reaction: Secondary | ICD-10-CM

## 2016-06-01 DIAGNOSIS — N809 Endometriosis, unspecified: Secondary | ICD-10-CM | POA: Diagnosis not present

## 2016-06-01 DIAGNOSIS — E119 Type 2 diabetes mellitus without complications: Secondary | ICD-10-CM

## 2016-06-01 DIAGNOSIS — E7800 Pure hypercholesterolemia, unspecified: Secondary | ICD-10-CM | POA: Diagnosis not present

## 2016-06-01 DIAGNOSIS — I10 Essential (primary) hypertension: Secondary | ICD-10-CM

## 2016-06-01 MED ORDER — ROSUVASTATIN CALCIUM 40 MG PO TABS: 40.0000 mg | ORAL_TABLET | Freq: Every day | ORAL | 1 refills | Status: DC

## 2016-06-01 MED ORDER — DIAZEPAM 2 MG PO TABS: 1.0000 mg | ORAL_TABLET | Freq: Two times a day (BID) | ORAL | 0 refills | Status: DC | PRN

## 2016-06-01 MED ORDER — LIRAGLUTIDE 18 MG/3ML ~~LOC~~ SOPN: PEN_INJECTOR | SUBCUTANEOUS | 0 refills | Status: DC

## 2016-06-01 MED ORDER — HYDROCODONE-ACETAMINOPHEN 5-325 MG PO TABS: 1.0000 | ORAL_TABLET | Freq: Every day | ORAL | 0 refills | Status: DC

## 2016-06-01 MED ORDER — AMOXICILLIN 875 MG PO TABS: 875.0000 mg | ORAL_TABLET | Freq: Two times a day (BID) | ORAL | 0 refills | Status: DC

## 2016-06-01 MED ORDER — FLUOXETINE HCL 20 MG PO CAPS: 20.0000 mg | ORAL_CAPSULE | Freq: Three times a day (TID) | ORAL | 1 refills | Status: DC

## 2016-06-01 MED ORDER — CYCLOBENZAPRINE HCL 5 MG PO TABS: ORAL_TABLET | ORAL | 1 refills | Status: DC

## 2016-06-01 MED ORDER — INSULIN GLARGINE 300 UNIT/ML ~~LOC~~ SOPN: 80.0000 [IU] | PEN_INJECTOR | Freq: Every day | SUBCUTANEOUS | 1 refills | Status: DC

## 2016-06-01 MED ORDER — NOVOTWIST 32G X 5 MM MISC: 3 refills | Status: DC

## 2016-06-01 MED ORDER — GLUCOSE BLOOD VI STRP: ORAL_STRIP | 12 refills | Status: DC

## 2016-06-01 MED ORDER — PHENTERMINE HCL 15 MG PO CAPS: 15.0000 mg | ORAL_CAPSULE | ORAL | 0 refills | Status: DC

## 2016-06-01 MED ORDER — GUAIFENESIN ER 1200 MG PO TB12: 1.0000 | ORAL_TABLET | Freq: Two times a day (BID) | ORAL | 0 refills | Status: AC

## 2016-06-01 MED ORDER — FUROSEMIDE 20 MG PO TABS: 10.0000 mg | ORAL_TABLET | Freq: Every day | ORAL | 0 refills | Status: DC

## 2016-06-01 MED ORDER — TRIAMCINOLONE ACETONIDE 55 MCG/ACT NA AERO: 2.0000 | INHALATION_SPRAY | Freq: Every day | NASAL | 4 refills | Status: DC

## 2016-06-01 MED ORDER — METOPROLOL TARTRATE 50 MG PO TABS: 50.0000 mg | ORAL_TABLET | Freq: Two times a day (BID) | ORAL | 0 refills | Status: DC

## 2016-06-01 MED ORDER — CELECOXIB 200 MG PO CAPS: 200.0000 mg | ORAL_CAPSULE | Freq: Every day | ORAL | 1 refills | Status: DC

## 2016-06-01 NOTE — Progress Notes (Addendum)
[name]  MRN: [id number] DOB: December 31, 1965  Subjective:  Pt presents to clinic for medication refill and wanting to restart phenteramine -- she does not want to do lab work today because she is not fasting - she did run out of her DM medications and been taking it evvery other day for the last week -- she has not been eating great but has not eating late at night, not eating a lot of sweets - she thinks her last A1C was high because of popsicles that she thought were sugar free but they were not.  Pt is currently not working for the next 3-4 months - boss is in [place] and she wants to focus on herself in the upcoming months -- she is planning on really eating healthy and really keeping track of her eating like she was able to do in the past when she figured out her stress eating and she plans to exercise - while she does this she would like to restart the phenteramine as this has helped her in the past but she stopped it because she was really stressed out and was not making good food decisions at that time - she feels like she will be able to do that currently as she starts to focus on herself.  She thinks she might have a sinus infections  She has seasonal allergies and has been on nasacort in the past but has been off of it for a while and she would like to restart on that - she has had sinus pressure and face pain for the last 2 weeks. She also plans to increase her prozac as we have discussed in the past but she has never done but she knows that she needs it.  She has been having trouble with her back and she been to the ED for eval and would like to have some more medication for that.    Pt is also interested in having the lipoma on the left side of her neck removed while she is out of work and she would like a referral.  Review of Systems  Constitutional: Negative for chills and fever.  HENT: Positive for sinus pressure (maxillary bilateral). Negative for congestion.   Allergic/Immunologic:  Positive for environmental allergies.    Patient Active Problem List   Diagnosis Date Noted  . Anxiety 03/08/2016  . Chest pain 02/20/2016  . Leukocytosis 02/20/2016  . Vitamin D insufficiency 09/25/2015  . Endometriosis 01/17/2014  . Sleep apnea with use of continuous positive airway pressure (CPAP) 06/27/2013  . Nocturia 06/27/2013  . Obesity, Class III, BMI 40-49.9 (morbid obesity) ([place])   . Obstructive sleep apnea 02/15/2013  . Hypersomnia, persistent   . HTN (hypertension) 02/03/2013  . DM (diabetes mellitus), type 2 ([place]) 02/03/2013    Current Outpatient Prescriptions on File Prior to Visit  Medication Sig Dispense Refill  . ibuprofen (ADVIL,MOTRIN) 800 MG tablet TAKE 1 TABLET (800 MG TOTAL) BY MOUTH EVERY 8 (EIGHT) HOURS AS NEEDED.  1  . [DISCONTINUED] Ranitidine HCl (ZANTAC PO) Take 1 tablet by mouth 2 (two) times daily. Patient uses this medication for heartburn.     No current facility-administered medications on file prior to visit.     Allergies  Allergen Reactions  . Darvocet [Propoxyphene N-Acetaminophen] Hives and Itching  . Lisinopril Cough  . Metformin And Related Other (See Comments)    Chest pain  . Morphine And Related Nausea And Vomiting  . Percocet [Oxycodone-Acetaminophen] Itching    And  headache  . Wellbutrin [Bupropion Hcl]     Hears voices  . Zofran [Ondansetron Hcl] Itching    Pt patients past, family and social history were reviewed and updated.  Objective:  BP 132/80 (BP Location: Right Arm, Patient Position: Sitting, Cuff Size: Large)   Pulse 67   Temp 98.5 F (36.9 C) (Oral)   Resp 18   Ht 5\' 1"  (1.549 m)   Wt 256 lb (116.1 kg)   LMP 04/07/2016   SpO2 95%   BMI 48.37 kg/m   Physical Exam  Constitutional: She is oriented to person, place, and time and well-developed, well-nourished, and in no distress.  HENT:  Head: Normocephalic and atraumatic.  Right Ear: Hearing, tympanic membrane, external ear and ear canal normal.  Left  Ear: Hearing, tympanic membrane, external ear and ear canal normal.  Nose: Right sinus exhibits maxillary sinus tenderness. Left sinus exhibits maxillary sinus tenderness.  Mouth/Throat: Uvula is midline, oropharynx is clear and moist and mucous membranes are normal.  Eyes: Conjunctivae are normal.  Neck: Normal range of motion.  Cardiovascular: Normal rate, regular rhythm and normal heart sounds.   No murmur heard. Pulmonary/Chest: Effort normal and breath sounds normal.  Neurological: She is alert and oriented to person, place, and time. Gait normal.  Skin: Skin is warm and dry.  Psychiatric: Mood, memory, affect and judgment normal.  Vitals reviewed.  Wt Readings from Last 3 Encounters:  06/01/16 256 lb (116.1 kg)  05/18/16 243 lb (110.2 kg)  04/05/16 250 lb (113.4 kg)    Assessment and Plan :  Endometriosis - Plan: HYDROcodone-acetaminophen (NORCO/VICODIN) 5-325 MG tablet, diazepam (VALIUM) 2 MG tablet  Pure hypercholesterolemia - Plan: rosuvastatin (CRESTOR) 40 MG tablet  Essential hypertension - Plan: metoprolol (LOPRESSOR) 50 MG tablet  Type 2 diabetes mellitus without complication, without long-term current use of insulin (HCC) - Plan: NOVOTWIST 32G X 5 MM MISC, Liraglutide (VICTOZA) 18 MG/3ML SOPN, Insulin Glargine (TOUJEO SOLOSTAR) 300 UNIT/ML SOPN, glucose blood test strip  Type 2 diabetes mellitus with other circulatory complication, with long-term current use of insulin (HCC) - we will not check labs today because the patient prefers to wait until her visit next month  Depression - Plan: FLUoxetine (PROZAC) 20 MG capsule - pt plans to increase to 60mg  while she is not working  Stress reaction - Plan: FLUoxetine (PROZAC) 20 MG capsule  Lumbar strain, initial encounter - Plan: celecoxib (CELEBREX) 200 MG capsule - continue this  Bilateral leg edema - Plan: furosemide (LASIX) 20 MG tablet - uses prn  Insomnia - Plan: diazepam (VALIUM) 2 MG tablet - hopefully this will  be even less when she is able to increase her prozac  Anxiety  Obstructive sleep apnea - continue sleep apnea  Obesity, Class III, BMI 40-49.9 (morbid obesity) ([place]) - Plan: phentermine 15 MG capsule -- d/w pt that she needs to start this when she is ready to be serious about healthy eating - I will see her back in a month after she starts it - we will trial for 3 months to monitor her weight loss  Sleep apnea with use of continuous positive airway pressure (CPAP)  Acute frontal sinusitis, recurrence not specified - Plan: amoxicillin (AMOXIL) 875 MG tablet, Guaifenesin (MUCINEX MAXIMUM STRENGTH) 1200 MG TB12  Allergic rhinitis, unspecified allergic rhinitis type - Plan: triamcinolone (NASACORT AQ) 55 MCG/ACT AERO nasal inhaler - restarts this so she will have less problems with her inhaler  Windell Hummingbird PA-C  [facility]  [facility] 06/04/2016 3:07 PM

## 2016-06-01 NOTE — Patient Instructions (Signed)
     IF you received an x-ray today, you will receive an invoice from [facility]. Please contact [facility] at [phone] with questions or concerns regarding your invoice.   IF you received labwork today, you will receive an invoice from [facility]. Please contact [facility] at [phone] with questions or concerns regarding your invoice.   Our billing staff will not be able to assist you with questions regarding bills from these companies.  You will be contacted with the lab results as soon as they are available. The fastest way to get your results is to activate your My Chart account. Instructions are located on the last page of this paperwork. If you have not heard from us regarding the results in 2 weeks, please contact this office.      

## 2016-06-04 NOTE — Addendum Note (Signed)
Addended by: Mancel Bale on: 06/04/2016 03:07 PM   Modules accepted: Orders

## 2016-06-15 DIAGNOSIS — D1722 Benign lipomatous neoplasm of skin and subcutaneous tissue of left arm: Secondary | ICD-10-CM | POA: Diagnosis not present

## 2016-06-15 DIAGNOSIS — F17200 Nicotine dependence, unspecified, uncomplicated: Secondary | ICD-10-CM | POA: Diagnosis not present

## 2016-06-22 ENCOUNTER — Encounter: Payer: Self-pay | Admitting: Physician Assistant

## 2016-06-22 DIAGNOSIS — D179 Benign lipomatous neoplasm, unspecified: Secondary | ICD-10-CM | POA: Insufficient documentation

## 2016-06-28 ENCOUNTER — Other Ambulatory Visit: Payer: Self-pay | Admitting: General Surgery

## 2016-06-28 ENCOUNTER — Other Ambulatory Visit: Payer: Self-pay

## 2016-06-28 DIAGNOSIS — N809 Endometriosis, unspecified: Secondary | ICD-10-CM

## 2016-06-28 DIAGNOSIS — D171 Benign lipomatous neoplasm of skin and subcutaneous tissue of trunk: Secondary | ICD-10-CM | POA: Diagnosis not present

## 2016-06-28 DIAGNOSIS — D1739 Benign lipomatous neoplasm of skin and subcutaneous tissue of other sites: Secondary | ICD-10-CM | POA: Diagnosis not present

## 2016-06-28 DIAGNOSIS — D1722 Benign lipomatous neoplasm of skin and subcutaneous tissue of left arm: Secondary | ICD-10-CM | POA: Diagnosis not present

## 2016-06-28 DIAGNOSIS — D179 Benign lipomatous neoplasm, unspecified: Secondary | ICD-10-CM | POA: Diagnosis not present

## 2016-06-28 NOTE — Telephone Encounter (Signed)
Patient request refill of diazepam (VALIUM) 2 MG tablet and HYDROcodone-acetaminophen (NORCO/VICODIN) 5-325 MG tablet

## 2016-06-29 NOTE — Telephone Encounter (Signed)
Pt seen on 9/5 and given 1 mos Rxs for both. Plan was for her to RTC in about 4 wks. Do you want to give her RFs to give her time to come in?

## 2016-06-30 MED ORDER — HYDROCODONE-ACETAMINOPHEN 5-325 MG PO TABS: 1.0000 | ORAL_TABLET | Freq: Every day | ORAL | 0 refills | Status: DC

## 2016-06-30 MED ORDER — DIAZEPAM 2 MG PO TABS: 1.0000 mg | ORAL_TABLET | Freq: Two times a day (BID) | ORAL | 0 refills | Status: DC | PRN

## 2016-06-30 NOTE — Telephone Encounter (Signed)
I will refill these but she needs to be seen next month

## 2016-07-06 NOTE — Telephone Encounter (Signed)
Called pt to verify she has p/up her Rx (not logged in book). Advised her that she is due for f/up for more. Pt agreed.

## 2016-09-08 ENCOUNTER — Ambulatory Visit (INDEPENDENT_AMBULATORY_CARE_PROVIDER_SITE_OTHER): Payer: BLUE CROSS/BLUE SHIELD | Admitting: Physician Assistant

## 2016-09-08 VITALS — BP 116/78 | HR 75 | Temp 98.2°F | Resp 17 | Ht 61.0 in | Wt 242.0 lb

## 2016-09-08 DIAGNOSIS — F430 Acute stress reaction: Secondary | ICD-10-CM

## 2016-09-08 DIAGNOSIS — Z1159 Encounter for screening for other viral diseases: Secondary | ICD-10-CM

## 2016-09-08 DIAGNOSIS — Z114 Encounter for screening for human immunodeficiency virus [HIV]: Secondary | ICD-10-CM

## 2016-09-08 DIAGNOSIS — N809 Endometriosis, unspecified: Secondary | ICD-10-CM | POA: Diagnosis not present

## 2016-09-08 DIAGNOSIS — I10 Essential (primary) hypertension: Secondary | ICD-10-CM | POA: Diagnosis not present

## 2016-09-08 DIAGNOSIS — E119 Type 2 diabetes mellitus without complications: Secondary | ICD-10-CM

## 2016-09-08 DIAGNOSIS — E7800 Pure hypercholesterolemia, unspecified: Secondary | ICD-10-CM

## 2016-09-08 DIAGNOSIS — F321 Major depressive disorder, single episode, moderate: Secondary | ICD-10-CM | POA: Diagnosis not present

## 2016-09-08 DIAGNOSIS — G44219 Episodic tension-type headache, not intractable: Secondary | ICD-10-CM

## 2016-09-08 MED ORDER — DIAZEPAM 2 MG PO TABS: 1.0000 mg | ORAL_TABLET | Freq: Two times a day (BID) | ORAL | 0 refills | Status: DC | PRN

## 2016-09-08 MED ORDER — INSULIN GLARGINE 300 UNIT/ML ~~LOC~~ SOPN: 80.0000 [IU] | PEN_INJECTOR | Freq: Every day | SUBCUTANEOUS | 1 refills | Status: DC

## 2016-09-08 MED ORDER — LIRAGLUTIDE 18 MG/3ML ~~LOC~~ SOPN: PEN_INJECTOR | SUBCUTANEOUS | 0 refills | Status: DC

## 2016-09-08 MED ORDER — FLUOXETINE HCL 20 MG PO CAPS: 20.0000 mg | ORAL_CAPSULE | Freq: Three times a day (TID) | ORAL | 1 refills | Status: DC

## 2016-09-08 MED ORDER — IBUPROFEN 800 MG PO TABS: 800.0000 mg | ORAL_TABLET | Freq: Three times a day (TID) | ORAL | 1 refills | Status: DC

## 2016-09-08 MED ORDER — KETOROLAC TROMETHAMINE 60 MG/2ML IM SOLN
60.0000 mg | Freq: Once | INTRAMUSCULAR | Status: AC
  Administered 2016-09-08: 60 mg via INTRAMUSCULAR

## 2016-09-08 MED ORDER — PHENTERMINE HCL 15 MG PO CAPS: 15.0000 mg | ORAL_CAPSULE | ORAL | 0 refills | Status: DC

## 2016-09-08 MED ORDER — ROSUVASTATIN CALCIUM 40 MG PO TABS: 40.0000 mg | ORAL_TABLET | Freq: Every day | ORAL | 1 refills | Status: DC

## 2016-09-08 MED ORDER — HYDROCODONE-ACETAMINOPHEN 5-325 MG PO TABS: 1.0000 | ORAL_TABLET | Freq: Every day | ORAL | 0 refills | Status: DC

## 2016-09-08 MED ORDER — METOPROLOL TARTRATE 50 MG PO TABS: 50.0000 mg | ORAL_TABLET | Freq: Two times a day (BID) | ORAL | 0 refills | Status: DC

## 2016-09-08 MED ORDER — FLUOXETINE HCL 20 MG PO CAPS: 20.0000 mg | ORAL_CAPSULE | Freq: Two times a day (BID) | ORAL | 1 refills | Status: DC

## 2016-09-08 NOTE — Progress Notes (Signed)
[name]  MRN: [id number] DOB: 20-Aug-1966  Subjective:  Pt presents to clinic for labs and medication refills.  She has been doing well.  She has been trying to lose weight - she went off the phenteramine last month because of additional stress but she is back on it now and needs a refill.  She is really happy - new job - in [employer] part department - really likes it - works with an old friend that she has worked with before.  She has had increase stress with husbands cancer diagnosis - now in his lymph nodes but it is not related to his liver cancer - he is seeing [facility] and they are happy so far.  They are focusing on themselves and she is looking out for herself and her husband and not worrying about other people.  Valium - 1 pill a day split up throughout the day prozac - '20mg'$  bid Lasix - 1-2 times a week  She has had a headache for the past 3 days and it is not getting better.  Her period is getting ready to start and she thinks that is the cause of her headache - she is interested in a Toradol injection while she is here today.  The lipoma has been removed and she is really happy with the results.  Review of Systems  Constitutional: Negative for chills and fever.  Respiratory: Negative for shortness of breath.   Cardiovascular: Negative for chest pain, palpitations and leg swelling.  Skin: Negative for wound.  Neurological: Negative for numbness and headaches.    Patient Active Problem List   Diagnosis Date Noted  . Anxiety 03/08/2016  . Chest pain 02/20/2016  . Vitamin D insufficiency 09/25/2015  . Endometriosis 01/17/2014  . Sleep apnea with use of continuous positive airway pressure (CPAP) 06/27/2013  . Nocturia 06/27/2013  . Obesity, Class III, BMI 40-49.9 (morbid obesity) ([place])   . Obstructive sleep apnea 02/15/2013  . Hypersomnia, persistent   . HTN (hypertension) 02/03/2013  . DM (diabetes mellitus), type 2 ([place]) 02/03/2013    Current Outpatient  Prescriptions on File Prior to Visit  Medication Sig Dispense Refill  . celecoxib (CELEBREX) 200 MG capsule Take 1 capsule (200 mg total) by mouth daily. 90 capsule 1  . cyclobenzaprine (FLEXERIL) 5 MG tablet TAKE 1 TABLET (5 MG TOTAL) BY MOUTH 3 (THREE) TIMES DAILY AS NEEDED FOR MUSCLE SPASMS. 90 tablet 1  . furosemide (LASIX) 20 MG tablet Take 0.5-1 tablets (10-20 mg total) by mouth daily. 60 tablet 0  . glucose blood test strip Use as instructed 100 each 12  . NOVOTWIST 32G X 5 MM MISC USE 1 APPLICATION BY DOES NOT APPLY ROUTE DAILY. 200 each 3  . triamcinolone (NASACORT AQ) 55 MCG/ACT AERO nasal inhaler Place 2 sprays into the nose daily. 3 Inhaler 4  . [DISCONTINUED] Ranitidine HCl (ZANTAC PO) Take 1 tablet by mouth 2 (two) times daily. Patient uses this medication for heartburn.     No current facility-administered medications on file prior to visit.     Allergies  Allergen Reactions  . Darvocet [Propoxyphene N-Acetaminophen] Hives and Itching  . Lisinopril Cough  . Metformin And Related Other (See Comments)    Chest pain  . Morphine And Related Nausea And Vomiting  . Percocet [Oxycodone-Acetaminophen] Itching    And headache  . Wellbutrin [Bupropion Hcl]     Hears voices  . Zofran [Ondansetron Hcl] Itching    Pt patients past, family and social  history were reviewed and updated.   Objective:  BP 116/78 (BP Location: Right Arm, Patient Position: Sitting, Cuff Size: Large)   Pulse 75   Temp 98.2 F (36.8 C) (Oral)   Resp 17   Ht '5\' 1"'$  (1.549 m)   Wt 242 lb (109.8 kg)   LMP 08/15/2016   SpO2 97%   BMI 45.73 kg/m   Physical Exam  Constitutional: She is oriented to person, place, and time and well-developed, well-nourished, and in no distress.  HENT:  Head: Normocephalic and atraumatic.  Right Ear: Hearing and external ear normal.  Left Ear: Hearing and external ear normal.  Eyes: Conjunctivae are normal.  Neck: Normal range of motion.  Cardiovascular: Normal rate,  regular rhythm and normal heart sounds.   No murmur heard. Pulmonary/Chest: Effort normal and breath sounds normal.  Musculoskeletal:       Right lower leg: She exhibits no edema.       Left lower leg: She exhibits no edema.  Neurological: She is alert and oriented to person, place, and time. Gait normal.  Skin: Skin is warm and dry.  Psychiatric: Mood, memory, affect and judgment normal.  Pt looks really happy today  Vitals reviewed.   Assessment and Plan :  Type 2 diabetes mellitus without complication, without long-term current use of insulin (HCC) - Plan: CMP14+EGFR, Hemoglobin A1c, Insulin Glargine (TOUJEO SOLOSTAR) 300 UNIT/ML SOPN, liraglutide (VICTOZA) 18 MG/3ML SOPN, Microalbumin, urine, HM DIABETES FOOT EXAM - refilled medications - check labs today - expect as the patient continues to lose weight this should continue to improve  Essential hypertension - Plan: metoprolol (LOPRESSOR) 50 MG tablet - good control  Pure hypercholesterolemia - Plan: Lipid panel, rosuvastatin (CRESTOR) 40 MG tablet - check labs and refilled medications  Endometriosis - Plan: ibuprofen (ADVIL,MOTRIN) 800 MG tablet, HYDROcodone-acetaminophen (NORCO/VICODIN) 5-325 MG tablet, diazepam (VALIUM) 2 MG tablet - refill medication  Moderate single current episode of major depressive disorder (HCC) - Plan: FLUoxetine (PROZAC) 20 MG capsule  Stress reaction - Plan: FLUoxetine (PROZAC) 20 MG capsule - continue current dose  Encounter for screening for HIV - Plan: HIV antibody - she has been checked in the past but we will screen again today  Need for hepatitis C screening test - Plan: Hepatitis C antibody - husband has been treated and cured and she has not been tested in a year so we will screen her again  Obesity, Class III, BMI 40-49.9 (morbid obesity) ([place]) - Plan: phentermine 15 MG capsule - pt has lost 14 lbs in 3 months - she is still motivated to lose weight -   Obesity, morbid (HCC)  Episodic  tension-type headache, not intractable - Plan: ketorolac (TORADOL) injection 60 mg  Windell Hummingbird PA-C  [facility] 09/08/2016 3:01 PM

## 2016-09-08 NOTE — Patient Instructions (Addendum)
  I will get your  Labs back   Please send a letter with the following and the lab results.    IF you received an x-ray today, you will receive an invoice from [facility]. Please contact [facility] at [phone] with questions or concerns regarding your invoice.   IF you received labwork today, you will receive an invoice from [employer]. Please contact [facility] at [phone] with questions or concerns regarding your invoice.   Our billing staff will not be able to assist you with questions regarding bills from these companies.  You will be contacted with the lab results as soon as they are available. The fastest way to get your results is to activate your My Chart account. Instructions are located on the last page of this paperwork. If you have not heard from [place] regarding the results in 2 weeks, please contact this office.

## 2016-09-09 LAB — CMP14+EGFR
A/G RATIO: 2 (ref 1.2–2.2)
ALT: 22 IU/L (ref 0–32)
AST: 16 IU/L (ref 0–40)
Albumin: 4.4 g/dL (ref 3.5–5.5)
Alkaline Phosphatase: 91 IU/L (ref 39–117)
BILIRUBIN TOTAL: 0.4 mg/dL (ref 0.0–1.2)
BUN/Creatinine Ratio: 23 (ref 9–23)
BUN: 17 mg/dL (ref 6–24)
CHLORIDE: 102 mmol/L (ref 96–106)
CO2: 22 mmol/L (ref 18–29)
Calcium: 9.3 mg/dL (ref 8.7–10.2)
Creatinine, Ser: 0.74 mg/dL (ref 0.57–1.00)
GFR calc non Af Amer: 95 mL/min/{1.73_m2} (ref >59)
GFR, EST AFRICAN AMERICAN: 110 mL/min/{1.73_m2} (ref >59)
GLOBULIN, TOTAL: 2.2 g/dL (ref 1.5–4.5)
Glucose: 109 mg/dL — ABNORMAL HIGH (ref 65–99)
POTASSIUM: 4.3 mmol/L (ref 3.5–5.2)
SODIUM: 140 mmol/L (ref 134–144)
Total Protein: 6.6 g/dL (ref 6.0–8.5)

## 2016-09-09 LAB — LIPID PANEL
Chol/HDL Ratio: 7.2 ratio units — ABNORMAL HIGH (ref 0.0–4.4)
Cholesterol, Total: 259 mg/dL — ABNORMAL HIGH (ref 100–199)
HDL: 36 mg/dL — AB (ref >39)
LDL Calculated: 173 mg/dL — ABNORMAL HIGH (ref 0–99)
TRIGLYCERIDES: 251 mg/dL — AB (ref 0–149)
VLDL Cholesterol Cal: 50 mg/dL — ABNORMAL HIGH (ref 5–40)

## 2016-09-09 LAB — HIV ANTIBODY (ROUTINE TESTING W REFLEX): HIV SCREEN 4TH GENERATION: NONREACTIVE

## 2016-09-09 LAB — HEPATITIS C ANTIBODY: Hep C Virus Ab: 0.9 s/co ratio (ref 0.0–0.9)

## 2016-09-09 LAB — HEMOGLOBIN A1C
Est. average glucose Bld gHb Est-mCnc: 160 mg/dL
HEMOGLOBIN A1C: 7.2 % — AB (ref 4.8–5.6)

## 2016-09-09 LAB — MICROALBUMIN, URINE: Microalbumin, Urine: 16.8 ug/mL

## 2016-09-09 NOTE — Addendum Note (Signed)
Addended by: Gari Crown D on: 09/09/2016 02:35 PM   Modules accepted: Orders

## 2016-09-11 LAB — HCV INTERPRETATION

## 2016-09-11 LAB — SPECIMEN STATUS REPORT

## 2016-09-11 LAB — HCV AB W REFLEX TO QUANT PCR: HCV Ab: 0.9 s/co ratio (ref 0.0–0.9)

## 2016-09-14 ENCOUNTER — Telehealth: Payer: Self-pay | Admitting: Family Medicine

## 2016-09-14 NOTE — Telephone Encounter (Signed)
Pt called about labs I gave her her her results

## 2016-09-28 ENCOUNTER — Emergency Department (HOSPITAL_BASED_OUTPATIENT_CLINIC_OR_DEPARTMENT_OTHER): Payer: BLUE CROSS/BLUE SHIELD

## 2016-09-28 ENCOUNTER — Encounter (HOSPITAL_BASED_OUTPATIENT_CLINIC_OR_DEPARTMENT_OTHER): Payer: Self-pay | Admitting: *Deleted

## 2016-09-28 ENCOUNTER — Emergency Department (HOSPITAL_BASED_OUTPATIENT_CLINIC_OR_DEPARTMENT_OTHER)
Admission: EM | Admit: 2016-09-28 | Discharge: 2016-09-28 | Disposition: A | Discharge status: Home / Self Care | Payer: BLUE CROSS/BLUE SHIELD | Attending: Physician Assistant | Admitting: Physician Assistant

## 2016-09-28 DIAGNOSIS — J3489 Other specified disorders of nose and nasal sinuses: Secondary | ICD-10-CM | POA: Insufficient documentation

## 2016-09-28 DIAGNOSIS — J111 Influenza due to unidentified influenza virus with other respiratory manifestations: Secondary | ICD-10-CM | POA: Diagnosis not present

## 2016-09-28 DIAGNOSIS — R197 Diarrhea, unspecified: Secondary | ICD-10-CM | POA: Diagnosis not present

## 2016-09-28 DIAGNOSIS — H9203 Otalgia, bilateral: Secondary | ICD-10-CM | POA: Insufficient documentation

## 2016-09-28 DIAGNOSIS — E119 Type 2 diabetes mellitus without complications: Secondary | ICD-10-CM | POA: Insufficient documentation

## 2016-09-28 DIAGNOSIS — R062 Wheezing: Secondary | ICD-10-CM | POA: Diagnosis not present

## 2016-09-28 DIAGNOSIS — I10 Essential (primary) hypertension: Secondary | ICD-10-CM | POA: Diagnosis not present

## 2016-09-28 DIAGNOSIS — Z794 Long term (current) use of insulin: Secondary | ICD-10-CM | POA: Diagnosis not present

## 2016-09-28 DIAGNOSIS — R509 Fever, unspecified: Secondary | ICD-10-CM | POA: Insufficient documentation

## 2016-09-28 DIAGNOSIS — R0981 Nasal congestion: Secondary | ICD-10-CM | POA: Insufficient documentation

## 2016-09-28 DIAGNOSIS — M791 Myalgia: Secondary | ICD-10-CM | POA: Diagnosis not present

## 2016-09-28 DIAGNOSIS — J029 Acute pharyngitis, unspecified: Secondary | ICD-10-CM | POA: Insufficient documentation

## 2016-09-28 DIAGNOSIS — G43809 Other migraine, not intractable, without status migrainosus: Secondary | ICD-10-CM | POA: Diagnosis not present

## 2016-09-28 DIAGNOSIS — F17210 Nicotine dependence, cigarettes, uncomplicated: Secondary | ICD-10-CM | POA: Diagnosis not present

## 2016-09-28 DIAGNOSIS — R05 Cough: Secondary | ICD-10-CM | POA: Diagnosis not present

## 2016-09-28 DIAGNOSIS — Z79899 Other long term (current) drug therapy: Secondary | ICD-10-CM | POA: Diagnosis not present

## 2016-09-28 DIAGNOSIS — R51 Headache: Secondary | ICD-10-CM | POA: Diagnosis not present

## 2016-09-28 DIAGNOSIS — R69 Illness, unspecified: Secondary | ICD-10-CM

## 2016-09-28 MED ORDER — SODIUM CHLORIDE 0.9 % IV BOLUS (SEPSIS): 1000.0000 mL | Freq: Once | INTRAVENOUS | Status: DC

## 2016-09-28 MED ORDER — KETOROLAC TROMETHAMINE 30 MG/ML IJ SOLN
15.0000 mg | Freq: Once | INTRAMUSCULAR | Status: AC
  Administered 2016-09-28: 15 mg via INTRAVENOUS
  Filled 2016-09-28: qty 1

## 2016-09-28 MED ORDER — SODIUM CHLORIDE 0.9 % IV BOLUS (SEPSIS)
1000.0000 mL | Freq: Once | INTRAVENOUS | Status: AC
  Administered 2016-09-28: 1000 mL via INTRAVENOUS

## 2016-09-28 MED ORDER — IPRATROPIUM-ALBUTEROL 0.5-2.5 (3) MG/3ML IN SOLN
3.0000 mL | Freq: Once | RESPIRATORY_TRACT | Status: AC
  Administered 2016-09-28: 3 mL via RESPIRATORY_TRACT
  Filled 2016-09-28: qty 3

## 2016-09-28 MED ORDER — DIPHENHYDRAMINE HCL 50 MG/ML IJ SOLN
25.0000 mg | Freq: Once | INTRAMUSCULAR | Status: AC
  Administered 2016-09-28: 25 mg via INTRAVENOUS
  Filled 2016-09-28: qty 1

## 2016-09-28 MED ORDER — PROCHLORPERAZINE EDISYLATE 5 MG/ML IJ SOLN
10.0000 mg | Freq: Once | INTRAMUSCULAR | Status: AC
  Administered 2016-09-28: 10 mg via INTRAVENOUS
  Filled 2016-09-28: qty 2

## 2016-09-28 MED ORDER — ACETAMINOPHEN 325 MG PO TABS
650.0000 mg | ORAL_TABLET | Freq: Once | ORAL | Status: AC
  Administered 2016-09-28: 650 mg via ORAL
  Filled 2016-09-28: qty 2

## 2016-09-28 NOTE — ED Provider Notes (Signed)
[place] DEPT [facility] Provider Note   CSN: [id number] Arrival date & time: 09/28/16  1513  By signing my name below, I, Dora Sims, attest that this documentation has been prepared under the direction and in the presence of Ocie Cornfield, PA-C. Electronically Signed: Dora Sims, Scribe. 09/28/2016. 6:56 PM.  History   Chief Complaint Chief Complaint  Patient presents with  . Fever    The history is provided by the patient. No language interpreter was used.     HPI Comments: [name] is a 51 y.o. female who presents to the Emergency Department complaining of a persistent fever beginning 3 days ago. She states her highest measured temperature is 102.8. She reports associated productive cough with green sputum, sore throat, congestion, headache, bilateral ear pain, sinus pressure, generalized myalgias, and some diarrhea. She reports a h/o migraines and states her current headache feels similar to past migraines. She notes a decreased appetite and states she has had minimal food and fluid intake since onset. She has tried Tylenol and ibuprofen with no relief of her symptoms; she notes she also has Vicodin at home and has used this without relief of her body aches. No known sick contacts with similar symptoms. She did not have a flu immunization this year. She is a regular smoker. She denies vision changes,Lightheadedness, dizziness nausea, vomiting, abdominal pain, urinary symptoms, change in bowel habits or any other associated symptoms.  Past Medical History:  Diagnosis Date  . Anxiety   . Arthritis   . Diabetes mellitus   . Endometriosis   . Hypersomnia, persistent    epworth 16   . Hypertension   . Lyme borreliosis   . Migraine   . Morbid obesity ([place])   . Obesity, Class III, BMI 40-49.9 (morbid obesity) ([place])   . Obstructive sleep apnea    CPAP NIGHTLY; sleep study 2007.  Marland Kitchen Renal disorder    kidney stone    Patient Active Problem List   Diagnosis Date Noted    . Anxiety 03/08/2016  . Chest pain 02/20/2016  . Vitamin D insufficiency 09/25/2015  . Endometriosis 01/17/2014  . Sleep apnea with use of continuous positive airway pressure (CPAP) 06/27/2013  . Nocturia 06/27/2013  . Obesity, Class III, BMI 40-49.9 (morbid obesity) ([place])   . Obstructive sleep apnea 02/15/2013  . Hypersomnia, persistent   . HTN (hypertension) 02/03/2013  . DM (diabetes mellitus), type 2 ([place]) 02/03/2013    Past Surgical History:  Procedure Laterality Date  . ABLATION ON ENDOMETRIOSIS    . ABLATION ON ENDOMETRIOSIS     10 years ago  . CARDIAC CATHETERIZATION N/A 02/20/2016   Procedure: Left Heart Cath and Coronary Angiography;  Surgeon: [name], MD;  Location: [facility];  Service: Cardiovascular;  Laterality: N/A;  . CHOLECYSTECTOMY    . KNEE ARTHROCENTESIS    . KNEE SURGERY    . TUBAL LIGATION      OB History    No data available       Home Medications    Prior to Admission medications   Medication Sig Start Date End Date Taking? Authorizing Provider  celecoxib (CELEBREX) 200 MG capsule Take 1 capsule (200 mg total) by mouth daily. 06/01/16   Mancel Bale, PA-C  cyclobenzaprine (FLEXERIL) 5 MG tablet TAKE 1 TABLET (5 MG TOTAL) BY MOUTH 3 (THREE) TIMES DAILY AS NEEDED FOR MUSCLE SPASMS. 06/01/16   Mancel Bale, PA-C  diazepam (VALIUM) 2 MG tablet Take 0.5-1 tablets (1-2 mg total) by  mouth every 12 (twelve) hours as needed for anxiety or muscle spasms. 09/08/16   Mancel Bale, PA-C  FLUoxetine (PROZAC) 20 MG capsule Take 1 capsule (20 mg total) by mouth 3 (three) times daily. 09/08/16   Mancel Bale, PA-C  FLUoxetine (PROZAC) 20 MG capsule Take 1 capsule (20 mg total) by mouth 2 (two) times daily. 09/08/16   Mancel Bale, PA-C  furosemide (LASIX) 20 MG tablet Take 0.5-1 tablets (10-20 mg total) by mouth daily. 06/01/16   Mancel Bale, PA-C  glucose blood test strip Use as instructed 06/01/16   Mancel Bale, PA-C  HYDROcodone-acetaminophen  (NORCO/VICODIN) 5-325 MG tablet Take 1 tablet by mouth daily. 09/08/16   Mancel Bale, PA-C  ibuprofen (ADVIL,MOTRIN) 800 MG tablet Take 1 tablet (800 mg total) by mouth 3 (three) times daily. 09/08/16   Mancel Bale, PA-C  Insulin Glargine (TOUJEO SOLOSTAR) 300 UNIT/ML SOPN Inject 80 Units into the skin daily. 09/08/16   Mancel Bale, PA-C  liraglutide (VICTOZA) 18 MG/3ML SOPN INJECT 0.3MLS SUBCUTANEOUSLY DAILY 09/08/16   Mancel Bale, PA-C  metoprolol (LOPRESSOR) 50 MG tablet Take 1 tablet (50 mg total) by mouth 2 (two) times daily. 09/08/16   Mancel Bale, PA-C  NOVOTWIST 32G X 5 MM MISC USE 1 APPLICATION BY DOES NOT APPLY ROUTE DAILY. 06/01/16   Mancel Bale, PA-C  phentermine 15 MG capsule Take 1 capsule (15 mg total) by mouth every morning. 09/08/16   Mancel Bale, PA-C  rosuvastatin (CRESTOR) 40 MG tablet Take 1 tablet (40 mg total) by mouth at bedtime. 09/08/16   Mancel Bale, PA-C  triamcinolone (NASACORT AQ) 55 MCG/ACT AERO nasal inhaler Place 2 sprays into the nose daily. 06/01/16   Mancel Bale, PA-C    Family History Family History  Problem Relation Age of Onset  . Diabetes Mother   . Thyroid disease Mother   . Depression Mother     with anxiety  . Stroke Mother   . Diabetes Father   . Hypertension Father   . Diabetes Sister   . Hypertension Sister   . Hypertension Brother     Social History Social History  Substance Use Topics  . Smoking status: Current Every Day Smoker    Packs/day: 0.20    Years: 0.00    Types: Cigarettes  . Smokeless tobacco: Never Used  . Alcohol use No     Allergies   Darvocet [propoxyphene n-acetaminophen]; Lisinopril; Metformin and related; Morphine and related; Percocet [oxycodone-acetaminophen]; Wellbutrin [bupropion hcl]; and Zofran [ondansetron hcl]   Review of Systems Review of Systems  Constitutional: Positive for appetite change (decreased) and fever.  HENT: Positive for congestion, ear pain (bilateral), sinus pressure and  sore throat.   Respiratory: Positive for cough.   Gastrointestinal: Positive for diarrhea. Negative for abdominal pain, nausea and vomiting.  Genitourinary: Negative for dysuria, frequency, hematuria, urgency, vaginal bleeding and vaginal discharge.  Musculoskeletal: Positive for myalgias (generalized).  Neurological: Positive for headaches. Negative for dizziness, weakness and numbness.  All other systems reviewed and are negative.    Physical Exam Updated Vital Signs BP 116/59 (BP Location: Right Arm)   Pulse 101   Temp 99.4 F (37.4 C) (Oral)   Resp 16   Ht 5\' 1"  (1.549 m)   Wt 109.8 kg   LMP 09/15/2016   SpO2 95%   BMI 45.73 kg/m   Physical Exam  Constitutional: She is oriented to person, place, and time. She appears  well-developed and well-nourished. No distress.  HENT:  Head: Normocephalic and atraumatic.  Right Ear: Tympanic membrane, external ear and ear canal normal.  Left Ear: Tympanic membrane, external ear and ear canal normal.  Nose: Mucosal edema and rhinorrhea present. Right sinus exhibits no maxillary sinus tenderness and no frontal sinus tenderness. Left sinus exhibits no maxillary sinus tenderness and no frontal sinus tenderness.  Mouth/Throat: Uvula is midline and mucous membranes are normal. Posterior oropharyngeal erythema present. No oropharyngeal exudate, posterior oropharyngeal edema or tonsillar abscesses. Tonsils are 1+ on the right. Tonsils are 1+ on the left. No tonsillar exudate.  Eyes: Conjunctivae and EOM are normal. Pupils are equal, round, and reactive to light.  Neck: Normal range of motion. Neck supple. No tracheal deviation present.  Cardiovascular: Normal rate, normal heart sounds and intact distal pulses.   Slight tachycardia  Pulmonary/Chest: Effort normal. No respiratory distress. She has wheezes.  intermittant wheezes throughout all lung fields, no rhonchi or crackles  Abdominal: Soft. Bowel sounds are normal. She exhibits no distension.  There is no tenderness. There is no rebound and no guarding.  Musculoskeletal: Normal range of motion.  Lymphadenopathy:    She has no cervical adenopathy.  Neurological: She is alert and oriented to person, place, and time.  The patient is alert, attentive, and oriented x 3. Speech is clear. Cranial nerve II-VII grossly intact. Negative pronator drift. Sensation intact. Strength 5/5 in all extremities. Reflexes 2+ and symmetric at biceps, triceps, knees, and ankles. Rapid alternating movement and fine finger movements intact. Romberg is absent. Posture and gait normal.   Skin: Skin is warm and dry. Capillary refill takes less than 2 seconds.  Psychiatric: She has a normal mood and affect. Her behavior is normal.  Nursing note and vitals reviewed.    ED Treatments / Results  Labs (all labs ordered are listed, but only abnormal results are displayed) Labs Reviewed - No data to display  EKG  EKG Interpretation None       Radiology Dg Chest 2 View  Result Date: 09/28/2016 CLINICAL DATA:  Pt c/o cough, congestion, green sputum, hyperlipidemia, HTN EXAM: CHEST  2 VIEW COMPARISON:  Chest x-ray dated 02/19/2016 FINDINGS: The heart size and mediastinal contours are within normal limits. Both lungs are clear. Mild degenerative spurring within the thoracic spine. No acute or suspicious osseous finding. IMPRESSION: No active cardiopulmonary disease. No evidence of pneumonia or pulmonary edema. Electronically Signed   By: Franki Cabot M.D.   On: 09/28/2016 15:51    Procedures Procedures (including critical care time)  DIAGNOSTIC STUDIES: Oxygen Saturation is 97% on RA, normal by my interpretation.    COORDINATION OF CARE: 7:03 PM Discussed treatment plan with pt at bedside and pt agreed to plan.  Medications Ordered in ED Medications  ketorolac (TORADOL) 30 MG/ML injection 15 mg (15 mg Intravenous Given 09/28/16 1928)  prochlorperazine (COMPAZINE) injection 10 mg (10 mg Intravenous  Given 09/28/16 1929)  diphenhydrAMINE (BENADRYL) injection 25 mg (25 mg Intravenous Given 09/28/16 1929)  sodium chloride 0.9 % bolus 1,000 mL (0 mLs Intravenous Stopped 09/28/16 2031)  acetaminophen (TYLENOL) tablet 650 mg (650 mg Oral Given 09/28/16 1926)  ipratropium-albuterol (DUONEB) 0.5-2.5 (3) MG/3ML nebulizer solution 3 mL (3 mLs Nebulization Given 09/28/16 1925)     Initial Impression / Assessment and Plan / ED Course  I have reviewed the triage vital signs and the nursing notes.  Pertinent labs & imaging results that were available during my care of the patient were reviewed by  me and considered in my medical decision making (see chart for details).  Clinical Course   Patient with symptoms consistent with influenza.  Vitals are stable, low-grade fever.  No signs of dehydration, tolerating PO's.  Lungs are clear. Discussed the cost versus benefit of Tamiflu treatment with the patient.  The patient understands that symptoms are greater than the recommended 24-48 hour window of treatment. Pt HA treated and improved while in ED.  Presentation is like pts typical HA and non concerning for [facility], ICH, Meningitis, or temporal arteritis. Pt is afebrile with no focal neuro deficits, nuchal rigidity, or change in vision. Pt remains slightly tachycardic at discharge. Second liter of fluid is ordered. Patient refuses fluid and state that she is ready to go feels much improved. Her husband who has cancer is waiting in the lobby and she is ready to be discharged. She feels stable for discharge. Patient is nontoxic appearing. Patient will be discharged with instructions to orally hydrate, rest, and use over-the-counter medications such as anti-inflammatories ibuprofen and Aleve for muscle aches and Tylenol for fever.  Patient will also be given a cough suppressant. Pt is hemodynamically stable, in NAD, & able to ambulate in the ED. Pain has been managed & has no complaints prior to dc. Pt is comfortable with above plan  and is stable for discharge at this time. All questions were answered prior to disposition. Strict return precautions for f/u to the ED were discussed.    Final Clinical Impressions(s) / ED Diagnoses   Final diagnoses:  Influenza-like illness  Other migraine without status migrainosus, not intractable    New Prescriptions Discharge Medication List as of 09/28/2016  9:05 PM     I personally performed the services described in this documentation, which was scribed in my presence. The recorded information has been reviewed and is accurate.    Doristine Devoid, PA-C 10/01/16 [place], MD 10/07/16 1146

## 2016-09-28 NOTE — ED Notes (Signed)
Pt requesting rx for cough medicine. Will consult with PA.

## 2016-09-28 NOTE — ED Notes (Signed)
Pt. Seen by PA C  And she tells him she has a migraine headache also.

## 2016-09-28 NOTE — Discharge Instructions (Signed)
This is likely the flu. Please stay hydrated. Use tylenol and ibuprofen for pain and fever. Follow up with your primary care doctor in regards to today's visit. Return to the ED if your symptoms worsen or if you develop chest pain, or worsening shortness of breath. Continue over the counter meds out home for symptoms.

## 2016-10-01 LAB — SPECIMEN STATUS REPORT

## 2016-10-01 LAB — HCV RNA QUANT: Hepatitis C Quantitation: NOT DETECTED IU/mL

## 2016-10-15 ENCOUNTER — Other Ambulatory Visit: Payer: Self-pay

## 2016-10-15 DIAGNOSIS — E66813 Obesity, class 3: Secondary | ICD-10-CM

## 2016-10-15 DIAGNOSIS — N809 Endometriosis, unspecified: Secondary | ICD-10-CM

## 2016-10-15 NOTE — Telephone Encounter (Signed)
Pt is needing a refill on vicodan, valium, and phentermine   Best number [phone]

## 2016-10-16 NOTE — Telephone Encounter (Signed)
Last refills and ov 09/08/16

## 2016-10-17 NOTE — Telephone Encounter (Signed)
If pt wants to continue the phenteramine I need to see her monthly

## 2016-10-20 NOTE — Telephone Encounter (Signed)
Attempted to contact, left voicemail that she needs to return for additional refills of the phenteramine, and that she should just return tomorrow.  Given your schedule tomorrow. Prescriptions denies.  Hope that is fine  Contacted sarah.  If she contacts back and does not want to phentermine, then we can refill the valium and the norco, but someone will have to do it in office, or send the request to sarah for her to fill the valium and norco only

## 2016-10-21 MED ORDER — DIAZEPAM 2 MG PO TABS: 1.0000 mg | ORAL_TABLET | Freq: Two times a day (BID) | ORAL | 0 refills | Status: DC | PRN

## 2016-10-21 MED ORDER — HYDROCODONE-ACETAMINOPHEN 5-325 MG PO TABS
1.0000 | ORAL_TABLET | Freq: Every day | ORAL | 0 refills | Status: DC
Start: 2016-10-21 — End: 2016-11-13

## 2016-10-21 NOTE — Telephone Encounter (Signed)
Up front for pick up 

## 2016-10-21 NOTE — Telephone Encounter (Signed)
I have refilled her hydrocodone and valium - but she needs an OV to discuss her phenteramine.

## 2016-10-21 NOTE — Addendum Note (Signed)
Addended by: Mancel Bale on: 10/21/2016 01:10 PM   Modules accepted: Orders

## 2016-11-13 ENCOUNTER — Ambulatory Visit (INDEPENDENT_AMBULATORY_CARE_PROVIDER_SITE_OTHER): Payer: BLUE CROSS/BLUE SHIELD | Admitting: Physician Assistant

## 2016-11-13 VITALS — BP 124/72 | HR 61 | Temp 98.7°F | Resp 14 | Ht 61.0 in | Wt 246.0 lb

## 2016-11-13 DIAGNOSIS — J0111 Acute recurrent frontal sinusitis: Secondary | ICD-10-CM | POA: Diagnosis not present

## 2016-11-13 DIAGNOSIS — R51 Headache: Secondary | ICD-10-CM | POA: Diagnosis not present

## 2016-11-13 DIAGNOSIS — N809 Endometriosis, unspecified: Secondary | ICD-10-CM | POA: Diagnosis not present

## 2016-11-13 DIAGNOSIS — F419 Anxiety disorder, unspecified: Secondary | ICD-10-CM | POA: Diagnosis not present

## 2016-11-13 DIAGNOSIS — Z794 Long term (current) use of insulin: Secondary | ICD-10-CM

## 2016-11-13 DIAGNOSIS — R059 Cough, unspecified: Secondary | ICD-10-CM

## 2016-11-13 DIAGNOSIS — E1159 Type 2 diabetes mellitus with other circulatory complications: Secondary | ICD-10-CM | POA: Diagnosis not present

## 2016-11-13 DIAGNOSIS — I10 Essential (primary) hypertension: Secondary | ICD-10-CM

## 2016-11-13 DIAGNOSIS — R05 Cough: Secondary | ICD-10-CM

## 2016-11-13 DIAGNOSIS — R519 Headache, unspecified: Secondary | ICD-10-CM

## 2016-11-13 MED ORDER — HYDROCODONE-ACETAMINOPHEN 5-325 MG PO TABS: 1.0000 | ORAL_TABLET | Freq: Every day | ORAL | 0 refills | Status: DC

## 2016-11-13 MED ORDER — HYDROCOD POLST-CPM POLST ER 10-8 MG/5ML PO SUER
5.0000 mL | Freq: Two times a day (BID) | ORAL | 0 refills | Status: DC | PRN
Start: 2016-11-13 — End: 2017-04-25

## 2016-11-13 MED ORDER — LORCASERIN HCL 10 MG PO TABS: 10.0000 mg | ORAL_TABLET | Freq: Every day | ORAL | 2 refills | Status: DC

## 2016-11-13 MED ORDER — AMOXICILLIN-POT CLAVULANATE 875-125 MG PO TABS: 1.0000 | ORAL_TABLET | Freq: Two times a day (BID) | ORAL | 0 refills | Status: DC

## 2016-11-13 MED ORDER — DIAZEPAM 2 MG PO TABS: 1.0000 mg | ORAL_TABLET | Freq: Two times a day (BID) | ORAL | 0 refills | Status: DC | PRN

## 2016-11-13 MED ORDER — KETOROLAC TROMETHAMINE 60 MG/2ML IM SOLN
60.0000 mg | Freq: Once | INTRAMUSCULAR | Status: AC
  Administered 2016-11-13: 60 mg via INTRAMUSCULAR

## 2016-11-13 MED ORDER — DIPHENHYDRAMINE HCL 50 MG/ML IJ SOLN
50.0000 mg | Freq: Once | INTRAMUSCULAR | Status: AC
  Administered 2016-11-13: 50 mg via INTRAMUSCULAR

## 2016-11-13 NOTE — Patient Instructions (Addendum)
Vit D 4000 U daily Start the [employer] with me in 3 months - please make an appt    IF you received an x-ray today, you will receive an invoice from [facility]. Please contact [facility] at [phone] with questions or concerns regarding your invoice.   IF you received labwork today, you will receive an invoice from [facility]. Please contact [facility] at [phone] with questions or concerns regarding your invoice.   Our billing staff will not be able to assist you with questions regarding bills from these companies.  You will be contacted with the lab results as soon as they are available. The fastest way to get your results is to activate your My Chart account. Instructions are located on the last page of this paperwork. If you have not heard from [place] regarding the results in 2 weeks, please contact this office.

## 2016-11-13 NOTE — Progress Notes (Signed)
[name]  MRN: [id number] DOB: 13-Jun-1966  Subjective:  Pt presents to clinic for medication refill and some other concerns.  She has lost some weight - she is currently on her menses --  Exercise bike - 5-8 miles a night until she got sick on Dec and since then she has decreased it.  Pt knows she is a stress eater - she eats different things based on her stressors.  She has significant family history of cardiac disease and she wants to decrease her risk.  She is currently happy with her job and things are less stressful at home but she knows that she will always have stress.  Eats breakfast, nothing after 5pm - sometimes no lunch -- less water recently.  Has been on phenteramine in the past but takes it irregularly - she has not had lasting effects of this medication.  Really wants to do something about her weight but not surgery.  Cold symptoms since 12/31 - sinus congestion with chest congestion - coughs all night - - green sputum - green rhinorrhea with PND.  No wheezing or SOB - has a resulting headache for weeks.  She has had a toradol injection with benadryl and that helps her go home and sleep and she was hoping to have that today.  She has cleaned her CPAP machines thinking that is why she is still congested but it has made no difference.  Depression - like her dose of prozac and does not want to change the medication - 20mg  bid is all she can handle more than that she gets sleepy and taking it tid she forgets the medication - she takes Ativan 1/2 in the am and 1/2 in the pm and that keeps her good mentally.  Review of Systems  HENT: Positive for congestion, postnasal drip and rhinorrhea.   Respiratory: Positive for cough (keeping her up at night) and wheezing (rare at night). Negative for shortness of breath.   Psychiatric/Behavioral: Positive for sleep disturbance (2nd to cough).    Patient Active Problem List   Diagnosis Date Noted  . Anxiety 03/08/2016  . Chest pain  02/20/2016  . Vitamin D insufficiency 09/25/2015  . Endometriosis 01/17/2014  . Sleep apnea with use of continuous positive airway pressure (CPAP) 06/27/2013  . Nocturia 06/27/2013  . Obesity, Class III, BMI 40-49.9 (morbid obesity) ([place])   . Obstructive sleep apnea 02/15/2013  . Hypersomnia, persistent   . HTN (hypertension) 02/03/2013  . DM (diabetes mellitus), type 2 ([place]) 02/03/2013    Current Outpatient Prescriptions on File Prior to Visit  Medication Sig Dispense Refill  . celecoxib (CELEBREX) 200 MG capsule Take 1 capsule (200 mg total) by mouth daily. 90 capsule 1  . cyclobenzaprine (FLEXERIL) 5 MG tablet TAKE 1 TABLET (5 MG TOTAL) BY MOUTH 3 (THREE) TIMES DAILY AS NEEDED FOR MUSCLE SPASMS. 90 tablet 1  . FLUoxetine (PROZAC) 20 MG capsule Take 1 capsule (20 mg total) by mouth 3 (three) times daily. (Patient taking differently: Take 20 mg by mouth 2 (two) times daily. ) 270 capsule 1  . furosemide (LASIX) 20 MG tablet Take 0.5-1 tablets (10-20 mg total) by mouth daily. 60 tablet 0  . glucose blood test strip Use as instructed 100 each 12  . ibuprofen (ADVIL,MOTRIN) 800 MG tablet Take 1 tablet (800 mg total) by mouth 3 (three) times daily. 90 tablet 1  . Insulin Glargine (TOUJEO SOLOSTAR) 300 UNIT/ML SOPN Inject 80 Units into the skin daily. 18 pen 1  .  liraglutide (VICTOZA) 18 MG/3ML SOPN INJECT 0.3MLS SUBCUTANEOUSLY DAILY 15 pen 0  . metoprolol (LOPRESSOR) 50 MG tablet Take 1 tablet (50 mg total) by mouth 2 (two) times daily. 180 tablet 0  . NOVOTWIST 32G X 5 MM MISC USE 1 APPLICATION BY DOES NOT APPLY ROUTE DAILY. 200 each 3  . rosuvastatin (CRESTOR) 40 MG tablet Take 1 tablet (40 mg total) by mouth at bedtime. 90 tablet 1  . triamcinolone (NASACORT AQ) 55 MCG/ACT AERO nasal inhaler Place 2 sprays into the nose daily. 3 Inhaler 4  . [DISCONTINUED] Ranitidine HCl (ZANTAC PO) Take 1 tablet by mouth 2 (two) times daily. Patient uses this medication for heartburn.     No current  facility-administered medications on file prior to visit.     Allergies  Allergen Reactions  . Darvocet [Propoxyphene N-Acetaminophen] Hives and Itching  . Lisinopril Cough  . Metformin And Related Other (See Comments)    Chest pain  . Morphine And Related Nausea And Vomiting  . Percocet [Oxycodone-Acetaminophen] Itching    And headache  . Wellbutrin [Bupropion Hcl]     Hears voices  . Zofran [Ondansetron Hcl] Itching    Pt patients past, family and social history were reviewed and updated.   Objective:  BP 124/72   Pulse 61   Temp 98.7 F (37.1 C)   Resp 14   Ht 5\' 1"  (1.549 m)   Wt 246 lb (111.6 kg)   SpO2 97%   BMI 46.48 kg/m   Physical Exam  Constitutional: She is oriented to person, place, and time and well-developed, well-nourished, and in no distress.  HENT:  Head: Normocephalic and atraumatic.  Right Ear: Hearing and external ear normal.  Left Ear: Hearing and external ear normal.  Eyes: Conjunctivae, EOM and lids are normal. Pupils are equal, round, and reactive to light.  Neck: Normal range of motion.  Cardiovascular: Normal rate, regular rhythm and normal heart sounds.   No murmur heard. Pulmonary/Chest: Effort normal and breath sounds normal. She has no wheezes.  Neurological: She is alert and oriented to person, place, and time. She has normal sensation, normal strength, normal reflexes and intact cranial nerves. She displays no weakness and facial symmetry. No sensory deficit. Gait normal. Coordination and gait normal.  Skin: Skin is warm and dry.  Psychiatric: Mood, memory, affect and judgment normal.  Vitals reviewed.   Assessment and Plan :  Essential hypertension  Endometriosis - Plan: HYDROcodone-acetaminophen (NORCO/VICODIN) 5-325 MG tablet, diazepam (VALIUM) 2 MG tablet - refilled medications -   Type 2 diabetes mellitus with other circulatory complication, with long-term current use of insulin (HCC)  Obesity, Class III, BMI 40-49.9 (morbid  obesity) ([place]) - Plan: Lorcaserin HCl 10 MG TABS - pt has not been successful with phenteramine long term - we will try Belviq - she is at a low dose of prozac so she should be ok but we did discuss serotonin syndrome - I encouraged patient to continue any type of physical activity and suggested weight management but she is not ready at this time - she is already on Victozia for her DM and she did notice some weight loss at the beginning of starting that years ago.  I suspect the patient is not eating enough some days and other days over eating and she is not getting enough physical activity.  Again we talked about weight loss surgery options. - we will recheck in 3 months and if no change in weight we again encourage weight management  visits.  Anxiety - plays a big role in her obesity - not controlled but patient is hesitant to change her mental health medications as this works for her now though we did discuss that long term use of Ativan is not optimal.  Cough - Plan: chlorpheniramine-HYDROcodone (TUSSIONEX PENNKINETIC ER) 10-8 MG/5ML SURE -   Acute recurrent frontal sinusitis - Plan: amoxicillin-clavulanate (AUGMENTIN) 875-125 MG tablet  Acute nonintractable headache, unspecified headache type - Plan: ketorolac (TORADOL) injection 60 mg, diphenhydrAMINE (BENADRYL) injection 50 mg  Windell Hummingbird PA-C  [facility] 11/16/2016 9:46 AM

## 2016-11-16 ENCOUNTER — Telehealth: Payer: Self-pay

## 2016-11-16 NOTE — Telephone Encounter (Signed)
belviq needs PA Key enuqnt Cover my meds

## 2016-11-17 NOTE — Telephone Encounter (Signed)
Submitted to cover my meds.  Waiting on response.

## 2016-11-22 ENCOUNTER — Other Ambulatory Visit: Payer: Self-pay

## 2016-11-22 DIAGNOSIS — E119 Type 2 diabetes mellitus without complications: Secondary | ICD-10-CM

## 2016-11-22 MED ORDER — LIRAGLUTIDE 18 MG/3ML ~~LOC~~ SOPN: PEN_INJECTOR | SUBCUTANEOUS | 2 refills | Status: DC

## 2016-11-27 ENCOUNTER — Emergency Department (HOSPITAL_BASED_OUTPATIENT_CLINIC_OR_DEPARTMENT_OTHER): Payer: BLUE CROSS/BLUE SHIELD

## 2016-11-27 ENCOUNTER — Encounter (HOSPITAL_BASED_OUTPATIENT_CLINIC_OR_DEPARTMENT_OTHER): Payer: Self-pay | Admitting: Emergency Medicine

## 2016-11-27 ENCOUNTER — Emergency Department (HOSPITAL_BASED_OUTPATIENT_CLINIC_OR_DEPARTMENT_OTHER)
Admission: EM | Admit: 2016-11-27 | Discharge: 2016-11-27 | Disposition: A | Discharge status: Home / Self Care | Payer: BLUE CROSS/BLUE SHIELD | Attending: Emergency Medicine | Admitting: Emergency Medicine

## 2016-11-27 DIAGNOSIS — Z79899 Other long term (current) drug therapy: Secondary | ICD-10-CM | POA: Diagnosis not present

## 2016-11-27 DIAGNOSIS — I10 Essential (primary) hypertension: Secondary | ICD-10-CM | POA: Insufficient documentation

## 2016-11-27 DIAGNOSIS — F17210 Nicotine dependence, cigarettes, uncomplicated: Secondary | ICD-10-CM | POA: Diagnosis not present

## 2016-11-27 DIAGNOSIS — E119 Type 2 diabetes mellitus without complications: Secondary | ICD-10-CM | POA: Insufficient documentation

## 2016-11-27 DIAGNOSIS — N200 Calculus of kidney: Secondary | ICD-10-CM | POA: Diagnosis not present

## 2016-11-27 DIAGNOSIS — R1030 Lower abdominal pain, unspecified: Secondary | ICD-10-CM | POA: Diagnosis not present

## 2016-11-27 DIAGNOSIS — R109 Unspecified abdominal pain: Secondary | ICD-10-CM | POA: Diagnosis not present

## 2016-11-27 LAB — PREGNANCY, URINE: Preg Test, Ur: NEGATIVE

## 2016-11-27 LAB — BASIC METABOLIC PANEL
Anion gap: 7 (ref 5–15)
BUN: 22 mg/dL — AB (ref 6–20)
CHLORIDE: 109 mmol/L (ref 101–111)
CO2: 26 mmol/L (ref 22–32)
Calcium: 9.4 mg/dL (ref 8.9–10.3)
Creatinine, Ser: 0.68 mg/dL (ref 0.44–1.00)
GFR calc Af Amer: >60 mL/min (ref >60)
GFR calc non Af Amer: >60 mL/min (ref >60)
Glucose, Bld: 102 mg/dL — ABNORMAL HIGH (ref 65–99)
POTASSIUM: 3.8 mmol/L (ref 3.5–5.1)
SODIUM: 142 mmol/L (ref 135–145)

## 2016-11-27 LAB — CBC
HEMATOCRIT: 37.8 % (ref 36.0–46.0)
HEMOGLOBIN: 12.5 g/dL (ref 12.0–15.0)
MCH: 29.6 pg (ref 26.0–34.0)
MCHC: 33.1 g/dL (ref 30.0–36.0)
MCV: 89.4 fL (ref 78.0–100.0)
Platelets: 332 10*3/uL (ref 150–400)
RBC: 4.23 MIL/uL (ref 3.87–5.11)
RDW: 14.5 % (ref 11.5–15.5)
WBC: 10 10*3/uL (ref 4.0–10.5)

## 2016-11-27 LAB — URINALYSIS, ROUTINE W REFLEX MICROSCOPIC
Bilirubin Urine: NEGATIVE
GLUCOSE, UA: NEGATIVE mg/dL
KETONES UR: NEGATIVE mg/dL
LEUKOCYTES UA: NEGATIVE
NITRITE: NEGATIVE
PROTEIN: NEGATIVE mg/dL
Specific Gravity, Urine: 1.028 (ref 1.005–1.030)
pH: 5 (ref 5.0–8.0)

## 2016-11-27 LAB — URINALYSIS, MICROSCOPIC (REFLEX)

## 2016-11-27 MED ORDER — HYDROMORPHONE HCL 1 MG/ML IJ SOLN
1.0000 mg | Freq: Once | INTRAMUSCULAR | Status: AC
  Administered 2016-11-27: 1 mg via INTRAVENOUS
  Filled 2016-11-27: qty 1

## 2016-11-27 MED ORDER — PROMETHAZINE HCL 25 MG/ML IJ SOLN
12.5000 mg | Freq: Once | INTRAMUSCULAR | Status: AC
  Administered 2016-11-27: 12.5 mg via INTRAVENOUS
  Filled 2016-11-27: qty 1

## 2016-11-27 MED ORDER — ACETAMINOPHEN 325 MG PO TABS
650.0000 mg | ORAL_TABLET | Freq: Once | ORAL | Status: AC
  Administered 2016-11-27: 650 mg via ORAL
  Filled 2016-11-27: qty 2

## 2016-11-27 MED ORDER — HYDROCODONE-ACETAMINOPHEN 5-325 MG PO TABS: 1.0000 | ORAL_TABLET | Freq: Four times a day (QID) | ORAL | 0 refills | Status: DC | PRN

## 2016-11-27 MED ORDER — SODIUM CHLORIDE 0.9 % IV BOLUS (SEPSIS)
1000.0000 mL | Freq: Once | INTRAVENOUS | Status: AC
  Administered 2016-11-27: 1000 mL via INTRAVENOUS

## 2016-11-27 MED ORDER — KETOROLAC TROMETHAMINE 15 MG/ML IJ SOLN
15.0000 mg | Freq: Once | INTRAMUSCULAR | Status: AC
Start: 2016-11-27 — End: 2016-11-27
  Administered 2016-11-27: 15 mg via INTRAVENOUS
  Filled 2016-11-27: qty 1

## 2016-11-27 NOTE — ED Notes (Signed)
ED Provider at bedside. 

## 2016-11-27 NOTE — ED Provider Notes (Signed)
[facility] Provider Note   CSN: [id number] Arrival date & time: 11/27/16  1356   By signing my name below, I, Avnee Patel, attest that this documentation has been prepared under the direction and in the presence of Blanchie Dessert, MD  Electronically Signed: Delton Pra[place], ED Scribe. 11/27/16. 4:43 PM.  History   Chief Complaint Chief Complaint  Patient presents with  . Flank Pain   The history is provided by the patient. No language interpreter was used.   HPI Comments:  [name] is a 51 y.o. female who presents to the Emergency Department complaining of acute onset, moderate, right sided lower back pain which moves to her lower right abdomen onset 8 AM today. Her pain is worse upon palpation. She also reports nausea. Pt has taken Vicodin and 800 mg ibuprofen with no relief. She has also applied icy hot with no relief. Pt denies vomiting, radiation of her pain down her leg or any other associated symptoms. She is allergic to morphine and Darvocet. No other complaints noted.   Past Medical History:  Diagnosis Date  . Anxiety   . Arthritis   . Diabetes mellitus   . Endometriosis   . Hypersomnia, persistent    epworth 16   . Hypertension   . Lyme borreliosis   . Migraine   . Morbid obesity ([place])   . Obesity, Class III, BMI 40-49.9 (morbid obesity) ([place])   . Obstructive sleep apnea    CPAP NIGHTLY; sleep study 2007.  Marland Kitchen Renal disorder    kidney stone    Patient Active Problem List   Diagnosis Date Noted  . Anxiety 03/08/2016  . Chest pain 02/20/2016  . Vitamin D insufficiency 09/25/2015  . Endometriosis 01/17/2014  . Sleep apnea with use of continuous positive airway pressure (CPAP) 06/27/2013  . Nocturia 06/27/2013  . Obesity, Class III, BMI 40-49.9 (morbid obesity) ([place])   . Obstructive sleep apnea 02/15/2013  . Hypersomnia, persistent   . HTN (hypertension) 02/03/2013  . DM (diabetes mellitus), type 2 ([place]) 02/03/2013    Past Surgical History:   Procedure Laterality Date  . ABLATION ON ENDOMETRIOSIS    . ABLATION ON ENDOMETRIOSIS     10 years ago  . CARDIAC CATHETERIZATION N/A 02/20/2016   Procedure: Left Heart Cath and Coronary Angiography;  Surgeon: [name], MD;  Location: [facility];  Service: Cardiovascular;  Laterality: N/A;  . CHOLECYSTECTOMY    . KNEE ARTHROCENTESIS    . KNEE SURGERY    . TUBAL LIGATION      OB History    No data available       Home Medications    Prior to Admission medications   Medication Sig Start Date End Date Taking? Authorizing Provider  diazepam (VALIUM) 2 MG tablet Take 0.5-1 tablets (1-2 mg total) by mouth every 12 (twelve) hours as needed for anxiety or muscle spasms. 11/13/16  Yes Mancel Bale, PA-C  FLUoxetine (PROZAC) 20 MG capsule Take 1 capsule (20 mg total) by mouth 3 (three) times daily. Patient taking differently: Take 20 mg by mouth 2 (two) times daily.  09/08/16  Yes Mancel Bale, PA-C  furosemide (LASIX) 20 MG tablet Take 0.5-1 tablets (10-20 mg total) by mouth daily. 06/01/16  Yes Mancel Bale, PA-C  HYDROcodone-acetaminophen (NORCO/VICODIN) 5-325 MG tablet Take 1 tablet by mouth daily. 11/13/16  Yes Mancel Bale, PA-C  ibuprofen (ADVIL,MOTRIN) 800 MG tablet Take 1 tablet (800 mg total) by mouth 3 (three) times  daily. 09/08/16  Yes Mancel Bale, PA-C  liraglutide (VICTOZA) 18 MG/3ML SOPN INJECT 0.3MLS SUBCUTANEOUSLY DAILY 11/22/16  Yes Mancel Bale, PA-C  metoprolol (LOPRESSOR) 50 MG tablet Take 1 tablet (50 mg total) by mouth 2 (two) times daily. 09/08/16  Yes Mancel Bale, PA-C  rosuvastatin (CRESTOR) 40 MG tablet Take 1 tablet (40 mg total) by mouth at bedtime. 09/08/16  Yes Mancel Bale, PA-C  amoxicillin-clavulanate (AUGMENTIN) 875-125 MG tablet Take 1 tablet by mouth 2 (two) times daily. 11/13/16   Mancel Bale, PA-C  celecoxib (CELEBREX) 200 MG capsule Take 1 capsule (200 mg total) by mouth daily. 06/01/16   Mancel Bale, PA-C    chlorpheniramine-HYDROcodone (TUSSIONEX PENNKINETIC ER) 10-8 MG/5ML SUER Take 5 mLs by mouth 2 (two) times daily as needed for cough. 11/13/16   Mancel Bale, PA-C  cyclobenzaprine (FLEXERIL) 5 MG tablet TAKE 1 TABLET (5 MG TOTAL) BY MOUTH 3 (THREE) TIMES DAILY AS NEEDED FOR MUSCLE SPASMS. 06/01/16   Mancel Bale, PA-C  glucose blood test strip Use as instructed 06/01/16   Mancel Bale, PA-C  Insulin Glargine (TOUJEO SOLOSTAR) 300 UNIT/ML SOPN Inject 80 Units into the skin daily. 09/08/16   Mancel Bale, PA-C  Lorcaserin HCl 10 MG TABS Take 10 mg by mouth daily. 11/13/16   Mancel Bale, PA-C  NOVOTWIST 32G X 5 MM MISC USE 1 APPLICATION BY DOES NOT APPLY ROUTE DAILY. 06/01/16   Mancel Bale, PA-C  triamcinolone (NASACORT AQ) 55 MCG/ACT AERO nasal inhaler Place 2 sprays into the nose daily. 06/01/16   Mancel Bale, PA-C    Family History Family History  Problem Relation Age of Onset  . Diabetes Mother   . Thyroid disease Mother   . Depression Mother     with anxiety  . Stroke Mother   . Diabetes Father   . Hypertension Father   . Diabetes Sister   . Hypertension Sister   . Hypertension Brother     Social History Social History  Substance Use Topics  . Smoking status: Current Every Day Smoker    Packs/day: 0.20    Years: 0.00    Types: Cigarettes  . Smokeless tobacco: Never Used  . Alcohol use No     Allergies   Darvocet [propoxyphene n-acetaminophen]; Lisinopril; Metformin and related; Morphine and related; Percocet [oxycodone-acetaminophen]; Wellbutrin [bupropion hcl]; and Zofran [ondansetron hcl]   Review of Systems Review of Systems  Gastrointestinal: Positive for abdominal pain and nausea. Negative for vomiting.  Genitourinary: Negative for flank pain.  Musculoskeletal: Positive for myalgias.  All other systems reviewed and are negative.  Physical Exam Updated Vital Signs BP 150/85 (BP Location: Left Arm)   Pulse 81   Temp 98.2 F (36.8 C) (Oral)   Resp 22   Ht  5\' 1"  (1.549 m)   Wt 242 lb (109.8 kg)   LMP 11/20/2016 (Exact Date)   SpO2 98%   BMI 45.73 kg/m   Physical Exam  Constitutional: She is oriented to person, place, and time. She appears well-developed and well-nourished. No distress.  Appears uncomfortable   HENT:  Head: Normocephalic and atraumatic.  Eyes: Conjunctivae are normal.  Cardiovascular: Normal rate.   Pulmonary/Chest: Effort normal.  Abdominal: She exhibits no distension. There is tenderness in the suprapubic area.  Minimal suprapubic tenderness   Neurological: She is alert and oriented to person, place, and time.  Skin: Skin is warm and dry.  Psychiatric: She has a normal mood  and affect.  Nursing note and vitals reviewed.    ED Treatments / Results  DIAGNOSTIC STUDIES:  Oxygen Saturation is 98% on RA, normal by my interpretation.    COORDINATION OF CARE:  4:21 PM Discussed treatment plan with pt at bedside and pt agreed to plan.  Labs (all labs ordered are listed, but only abnormal results are displayed) Labs Reviewed  URINALYSIS, ROUTINE W REFLEX MICROSCOPIC - Abnormal; Notable for the following:       Result Value   Hgb urine dipstick LARGE (*)    All other components within normal limits  URINALYSIS, MICROSCOPIC (REFLEX) - Abnormal; Notable for the following:    Bacteria, UA MANY (*)    Squamous Epithelial / LPF 6-30 (*)    All other components within normal limits  BASIC METABOLIC PANEL - Abnormal; Notable for the following:    Glucose, Bld 102 (*)    BUN 22 (*)    All other components within normal limits  PREGNANCY, URINE  CBC    EKG  EKG Interpretation None       Radiology Ct Renal Stone Study  Result Date: 11/27/2016 CLINICAL DATA:  Right flank pain with nausea this morning. EXAM: CT ABDOMEN AND PELVIS WITHOUT CONTRAST TECHNIQUE: Multidetector CT imaging of the abdomen and pelvis was performed following the standard protocol without IV contrast. COMPARISON:  April 05, 2016 FINDINGS:  Lower chest: No acute abnormality. Hepatobiliary: No focal liver abnormality is seen. Status post cholecystectomy. No biliary dilatation. Pancreas: Unremarkable. No pancreatic ductal dilatation or surrounding inflammatory changes. Spleen: Normal in size without focal abnormality. Adrenals/Urinary Tract: The adrenal glands are normal. There is punctate stone in the mid right kidney with no left renal stones identified. No hydronephrosis or perinephric stranding. No ureterectasis or periureteral stranding. The bladder is poorly distended but grossly unremarkable. Stomach/Bowel: The stomach and small bowel are normal. The colon demonstrates scattered colonic diverticuli with no evidence of diverticulitis. The cecum is located near midline rather than within the right lower quadrant. The appendix is not seen but there is no secondary evidence of appendicitis. Vascular/Lymphatic: Atherosclerotic changes seen in the aorta and branching vessels with no aneurysm. No adenopathy. Reproductive: Uterus and bilateral adnexa are unremarkable. Other: There is a fat containing umbilical hernia. No free air or free fluid. Musculoskeletal: No acute or significant osseous findings. IMPRESSION: 1. No cause for the patient's symptoms identified. There is a tiny nonobstructive stone in the right kidney. No ureteral stones or obstruction. The appendix is not visualized but there is no secondary evidence of appendicitis. 2. Diverticulosis without diverticulitis. 3. Mild atherosclerotic change in the abdominal aorta and branching vessels. Electronically Signed   By: Dorise Bullion III M.D   On: 11/27/2016 17:36    Procedures Procedures (including critical care time)  Medications Ordered in ED Medications  sodium chloride 0.9 % bolus 1,000 mL (1,000 mLs Intravenous New Bag/Given 11/27/16 1637)  acetaminophen (TYLENOL) tablet 650 mg (650 mg Oral Given 11/27/16 1555)  HYDROmorphone (DILAUDID) injection 1 mg (1 mg Intravenous Given  11/27/16 1644)  promethazine (PHENERGAN) injection 12.5 mg (12.5 mg Intravenous Given 11/27/16 1640)     Initial Impression / Assessment and Plan / ED Course  I have reviewed the triage vital signs and the nursing notes.  Pertinent labs & imaging results that were available during my care of the patient were reviewed by me and considered in my medical decision making (see chart for details).     Pt with symptoms consistent with kidney  stone.  Denies infectious sx, or GI symptoms.  Low concern for diverticulitis and no  history suggestive of AAA.  No hx suggestive of GU source (discharge).  Will hydrate, treat pain and ensure no infection with UA, CBC, CMP and will get stone study to further eval.  5:23 PM UA with blood and UPT neg and cbc and bmp wnl.  CT showed small stone in the right kidney but no acute findings. Patient's pain is improved but still present. Low suspicion for ovarian torsion or musculoskeletal. May be a radiolucent stone.  Final Clinical Impressions(s) / ED Diagnoses   Final diagnoses:  Kidney stone    New Prescriptions New Prescriptions   HYDROCODONE-ACETAMINOPHEN (NORCO/VICODIN) 5-325 MG TABLET    Take 1-2 tablets by mouth every 6 (six) hours as needed.   I personally performed the services described in this documentation, which was scribed in my presence.  The recorded information has been reviewed and considered.     Blanchie Dessert, MD 11/27/16 [phone]

## 2016-11-27 NOTE — ED Triage Notes (Signed)
Right flank pain with nausea this am. Pain radiates to right lower quadrant. Took ibuprofen 800mg  x 2 hrs ago

## 2016-11-30 NOTE — Telephone Encounter (Signed)
Spoke to patient to let her know insurance claim was approved, thru 05/15/2017. Patient is aware.

## 2016-12-07 NOTE — Telephone Encounter (Signed)
rx for hydrocodone and valium never picked up  Destroyed by me

## 2016-12-21 ENCOUNTER — Emergency Department (HOSPITAL_BASED_OUTPATIENT_CLINIC_OR_DEPARTMENT_OTHER)
Admission: EM | Admit: 2016-12-21 | Discharge: 2016-12-21 | Disposition: A | Discharge status: Home / Self Care | Payer: BLUE CROSS/BLUE SHIELD | Attending: Physician Assistant | Admitting: Physician Assistant

## 2016-12-21 ENCOUNTER — Encounter (HOSPITAL_BASED_OUTPATIENT_CLINIC_OR_DEPARTMENT_OTHER): Payer: Self-pay

## 2016-12-21 ENCOUNTER — Emergency Department (HOSPITAL_BASED_OUTPATIENT_CLINIC_OR_DEPARTMENT_OTHER): Payer: BLUE CROSS/BLUE SHIELD

## 2016-12-21 DIAGNOSIS — Z794 Long term (current) use of insulin: Secondary | ICD-10-CM | POA: Insufficient documentation

## 2016-12-21 DIAGNOSIS — N200 Calculus of kidney: Secondary | ICD-10-CM | POA: Diagnosis not present

## 2016-12-21 DIAGNOSIS — Z79899 Other long term (current) drug therapy: Secondary | ICD-10-CM | POA: Diagnosis not present

## 2016-12-21 DIAGNOSIS — Q433 Congenital malformations of intestinal fixation: Secondary | ICD-10-CM | POA: Diagnosis not present

## 2016-12-21 DIAGNOSIS — F17210 Nicotine dependence, cigarettes, uncomplicated: Secondary | ICD-10-CM | POA: Insufficient documentation

## 2016-12-21 DIAGNOSIS — Q439 Congenital malformation of intestine, unspecified: Secondary | ICD-10-CM | POA: Diagnosis not present

## 2016-12-21 DIAGNOSIS — E119 Type 2 diabetes mellitus without complications: Secondary | ICD-10-CM | POA: Diagnosis not present

## 2016-12-21 DIAGNOSIS — R109 Unspecified abdominal pain: Secondary | ICD-10-CM | POA: Diagnosis not present

## 2016-12-21 DIAGNOSIS — R1031 Right lower quadrant pain: Secondary | ICD-10-CM

## 2016-12-21 DIAGNOSIS — R1111 Vomiting without nausea: Secondary | ICD-10-CM | POA: Diagnosis not present

## 2016-12-21 DIAGNOSIS — I10 Essential (primary) hypertension: Secondary | ICD-10-CM | POA: Insufficient documentation

## 2016-12-21 LAB — URINALYSIS, ROUTINE W REFLEX MICROSCOPIC
Bilirubin Urine: NEGATIVE
Glucose, UA: NEGATIVE mg/dL
Hgb urine dipstick: NEGATIVE
KETONES UR: NEGATIVE mg/dL
LEUKOCYTES UA: NEGATIVE
NITRITE: NEGATIVE
PH: 5.5 (ref 5.0–8.0)
Protein, ur: NEGATIVE mg/dL
SPECIFIC GRAVITY, URINE: 1.023 (ref 1.005–1.030)

## 2016-12-21 LAB — BASIC METABOLIC PANEL
ANION GAP: 9 (ref 5–15)
BUN: 15 mg/dL (ref 6–20)
CO2: 24 mmol/L (ref 22–32)
Calcium: 9 mg/dL (ref 8.9–10.3)
Chloride: 104 mmol/L (ref 101–111)
Creatinine, Ser: 0.68 mg/dL (ref 0.44–1.00)
GFR calc Af Amer: >60 mL/min (ref >60)
Glucose, Bld: 143 mg/dL — ABNORMAL HIGH (ref 65–99)
POTASSIUM: 3.4 mmol/L — AB (ref 3.5–5.1)
SODIUM: 137 mmol/L (ref 135–145)

## 2016-12-21 LAB — CBC
HEMATOCRIT: 41 % (ref 36.0–46.0)
HEMOGLOBIN: 13.9 g/dL (ref 12.0–15.0)
MCH: 29.4 pg (ref 26.0–34.0)
MCHC: 33.9 g/dL (ref 30.0–36.0)
MCV: 86.9 fL (ref 78.0–100.0)
Platelets: 249 10*3/uL (ref 150–400)
RBC: 4.72 MIL/uL (ref 3.87–5.11)
RDW: 15.1 % (ref 11.5–15.5)
WBC: 7.9 10*3/uL (ref 4.0–10.5)

## 2016-12-21 LAB — PREGNANCY, URINE: PREG TEST UR: NEGATIVE

## 2016-12-21 MED ORDER — HYDROMORPHONE HCL 1 MG/ML IJ SOLN
1.0000 mg | Freq: Once | INTRAMUSCULAR | Status: AC
  Administered 2016-12-21: 1 mg via INTRAVENOUS
  Filled 2016-12-21: qty 1

## 2016-12-21 MED ORDER — PROMETHAZINE HCL 25 MG/ML IJ SOLN
12.5000 mg | Freq: Once | INTRAMUSCULAR | Status: AC
  Administered 2016-12-21: 12.5 mg via INTRAVENOUS
  Filled 2016-12-21: qty 1

## 2016-12-21 MED ORDER — PROMETHAZINE HCL 25 MG/ML IJ SOLN
25.0000 mg | Freq: Once | INTRAMUSCULAR | Status: AC
  Administered 2016-12-21: 25 mg via INTRAVENOUS
  Filled 2016-12-21: qty 1

## 2016-12-21 MED ORDER — SODIUM CHLORIDE 0.9 % IV BOLUS (SEPSIS)
1000.0000 mL | Freq: Once | INTRAVENOUS | Status: AC
  Administered 2016-12-21: 1000 mL via INTRAVENOUS

## 2016-12-21 MED ORDER — HYDROCODONE-ACETAMINOPHEN 5-325 MG PO TABS: 1.0000 | ORAL_TABLET | Freq: Four times a day (QID) | ORAL | 0 refills | Status: DC | PRN

## 2016-12-21 MED ORDER — IOPAMIDOL (ISOVUE-300) INJECTION 61%
100.0000 mL | Freq: Once | INTRAVENOUS | Status: AC | PRN
  Administered 2016-12-21: 100 mL via INTRAVENOUS

## 2016-12-21 NOTE — ED Triage Notes (Signed)
Pt c/o n/v/d for the last four days with fever.  Two hours ago, pt began having right flank pain radiating to right lower abdomen and groin.  Pt took 800mg  ibuprofen prior to arrival at 1500.

## 2016-12-21 NOTE — ED Provider Notes (Signed)
[facility] DE[facility] Provider Note   CSN: [id number] Arrival date & time: 12/21/16  1638   By signing my name below, I, Eunice Blase, attest that this documentation has been prepared under the direction and in the presence of Montine Circle, PA-C. Electronically Signed: Eunice Blase, Scribe. 12/21/16. 6:30 PM.   History   Chief Complaint Chief Complaint  Patient presents with  . Flank Pain   HPI  HPI Comments: [name] is a 51 y.o. female who presents to the Emergency Department complaining of worsening vomiting x 5 days. Pt reports associated diarrhea, fever (tMax 103.1 measured orally at home), cough, congestion, constant R sided stabbing flank pain since ~3:00 PM today that radiates around to the R lower abdomen, appetite decrease, myalgias, urinary incontinence, weakness and fatigue. She adds concern for increasing high blood pressure readings at home since the onset of her pain. Pt notes Hx of similar, less severe symptoms treated in [facility] ED without dx of kidney stones. Pt adds she has taken Tylenol and prescribed 800 mg ibuprofen without relief. She reports she takes metoprolol regularly at home to manage HTN. Pt denies hematuria, dysuria, vaginal bleeding and vaginal discharge.  Past Medical History:  Diagnosis Date  . Anxiety   . Arthritis   . Diabetes mellitus   . Endometriosis   . Hypersomnia, persistent    epworth 16   . Hypertension   . Lyme borreliosis   . Migraine   . Morbid obesity ([place])   . Obesity, Class III, BMI 40-49.9 (morbid obesity) ([place])   . Obstructive sleep apnea    CPAP NIGHTLY; sleep study 2007.  Marland Kitchen Renal disorder    kidney stone    Patient Active Problem List   Diagnosis Date Noted  . Anxiety 03/08/2016  . Chest pain 02/20/2016  . Vitamin D insufficiency 09/25/2015  . Endometriosis 01/17/2014  . Sleep apnea with use of continuous positive airway pressure (CPAP) 06/27/2013  . Nocturia 06/27/2013  . Obesity, Class III, BMI 40-49.9  (morbid obesity) ([place])   . Obstructive sleep apnea 02/15/2013  . Hypersomnia, persistent   . HTN (hypertension) 02/03/2013  . DM (diabetes mellitus), type 2 ([place]) 02/03/2013    Past Surgical History:  Procedure Laterality Date  . ABLATION ON ENDOMETRIOSIS    . ABLATION ON ENDOMETRIOSIS     10 years ago  . CARDIAC CATHETERIZATION N/A 02/20/2016   Procedure: Left Heart Cath and Coronary Angiography;  Surgeon: [name], MD;  Location: [place];  Service: Cardiovascular;  Laterality: N/A;  . CHOLECYSTECTOMY    . KNEE ARTHROCENTESIS    . KNEE SURGERY    . TUBAL LIGATION      OB History    No data available       Home Medications    Prior to Admission medications   Medication Sig Start Date End Date Taking? Authorizing Provider  amoxicillin-clavulanate (AUGMENTIN) 875-125 MG tablet Take 1 tablet by mouth 2 (two) times daily. 11/13/16   Mancel Bale, PA-C  celecoxib (CELEBREX) 200 MG capsule Take 1 capsule (200 mg total) by mouth daily. 06/01/16   Mancel Bale, PA-C  chlorpheniramine-HYDROcodone (TUSSIONEX PENNKINETIC ER) 10-8 MG/5ML SUER Take 5 mLs by mouth 2 (two) times daily as needed for cough. 11/13/16   Mancel Bale, PA-C  cyclobenzaprine (FLEXERIL) 5 MG tablet TAKE 1 TABLET (5 MG TOTAL) BY MOUTH 3 (THREE) TIMES DAILY AS NEEDED FOR MUSCLE SPASMS. 06/01/16   Mancel Bale, PA-C  diazepam (VALIUM) 2 MG  tablet Take 0.5-1 tablets (1-2 mg total) by mouth every 12 (twelve) hours as needed for anxiety or muscle spasms. 11/13/16   Mancel Bale, PA-C  FLUoxetine (PROZAC) 20 MG capsule Take 1 capsule (20 mg total) by mouth 3 (three) times daily. Patient taking differently: Take 20 mg by mouth 2 (two) times daily.  09/08/16   Mancel Bale, PA-C  furosemide (LASIX) 20 MG tablet Take 0.5-1 tablets (10-20 mg total) by mouth daily. 06/01/16   Mancel Bale, PA-C  glucose blood test strip Use as instructed 06/01/16   Mancel Bale, PA-C  HYDROcodone-acetaminophen (NORCO/VICODIN) 5-325  MG tablet Take 1 tablet by mouth daily. 11/13/16   Mancel Bale, PA-C  HYDROcodone-acetaminophen (NORCO/VICODIN) 5-325 MG tablet Take 1-2 tablets by mouth every 6 (six) hours as needed. 11/27/16   Blanchie Dessert, MD  ibuprofen (ADVIL,MOTRIN) 800 MG tablet Take 1 tablet (800 mg total) by mouth 3 (three) times daily. 09/08/16   Mancel Bale, PA-C  Insulin Glargine (TOUJEO SOLOSTAR) 300 UNIT/ML SOPN Inject 80 Units into the skin daily. 09/08/16   Mancel Bale, PA-C  liraglutide (VICTOZA) 18 MG/3ML SOPN INJECT 0.3MLS SUBCUTANEOUSLY DAILY 11/22/16   Mancel Bale, PA-C  Lorcaserin HCl 10 MG TABS Take 10 mg by mouth daily. 11/13/16   Mancel Bale, PA-C  metoprolol (LOPRESSOR) 50 MG tablet Take 1 tablet (50 mg total) by mouth 2 (two) times daily. 09/08/16   Mancel Bale, PA-C  NOVOTWIST 32G X 5 MM MISC USE 1 APPLICATION BY DOES NOT APPLY ROUTE DAILY. 06/01/16   Mancel Bale, PA-C  rosuvastatin (CRESTOR) 40 MG tablet Take 1 tablet (40 mg total) by mouth at bedtime. 09/08/16   Mancel Bale, PA-C  triamcinolone (NASACORT AQ) 55 MCG/ACT AERO nasal inhaler Place 2 sprays into the nose daily. 06/01/16   Mancel Bale, PA-C    Family History Family History  Problem Relation Age of Onset  . Diabetes Mother   . Thyroid disease Mother   . Depression Mother     with anxiety  . Stroke Mother   . Diabetes Father   . Hypertension Father   . Diabetes Sister   . Hypertension Sister   . Hypertension Brother     Social History Social History  Substance Use Topics  . Smoking status: Current Every Day Smoker    Packs/day: 0.20    Years: 0.00    Types: Cigarettes  . Smokeless tobacco: Never Used  . Alcohol use No     Allergies   Darvocet [propoxyphene n-acetaminophen]; Lisinopril; Metformin and related; Morphine and related; Percocet [oxycodone-acetaminophen]; Wellbutrin [bupropion hcl]; and Zofran [ondansetron hcl]   Review of Systems Review of Systems  Constitutional: Positive for fatigue and  fever.  HENT: Positive for congestion.   Respiratory: Positive for cough.   Gastrointestinal: Positive for abdominal pain, diarrhea, nausea and vomiting.  Genitourinary: Positive for flank pain. Negative for dysuria, hematuria, vaginal bleeding and vaginal discharge.       +urinary incontinence   Musculoskeletal: Positive for myalgias.  Neurological: Positive for weakness.     Physical Exam Updated Vital Signs BP (!) 173/100 (BP Location: Right Arm) Comment: HX of hypertension  Pulse 77   Temp 99.8 F (37.7 C) (Oral)   Resp 20   Ht 5\' 1"  (1.549 m)   Wt 237 lb (107.5 kg)   SpO2 98%   BMI 44.78 kg/m   Physical Exam  Constitutional: She is oriented to person, place, and  time. She appears well-developed and well-nourished.  HENT:  Head: Normocephalic and atraumatic.  Eyes: Pupils are equal, round, and reactive to light.  Cardiovascular: Regular rhythm.   Pulmonary/Chest: Effort normal.  Abdominal: Soft.  Musculoskeletal: Normal range of motion.  Neurological: She is alert and oriented to person, place, and time.  5/5 strength in major muscle groups of  bilateral upper and lower extremities. Speech normal. No facial asymetry.   Nursing note and vitals reviewed.    ED Treatments / Results  DIAGNOSTIC STUDIES: Oxygen Saturation is 98% on RA, normal by my interpretation.    COORDINATION OF CARE: 6:26 PM Discussed treatment plan with pt at bedside and pt agreed to plan. Will order medications, imaging and blood work.  Labs (all labs ordered are listed, but only abnormal results are displayed) Labs Reviewed  BASIC METABOLIC PANEL - Abnormal; Notable for the following:       Result Value   Potassium 3.4 (*)    Glucose, Bld 143 (*)    All other components within normal limits  CBC  URINALYSIS, ROUTINE W REFLEX MICROSCOPIC  PREGNANCY, URINE    EKG  EKG Interpretation None       Radiology Ct Abdomen Pelvis Wo Contrast  Result Date: 12/21/2016 CLINICAL DATA:   Right lower quadrant pain for 2 hours. Nausea vomiting and diarrhea for 4 days. Hypertension. Diabetes. Endometriosis. EXAM: CT ABDOMEN AND PELVIS WITHOUT CONTRAST TECHNIQUE: Multidetector CT imaging of the abdomen and pelvis was performed following the standard protocol without IV contrast. COMPARISON:  11/27/2016 FINDINGS: Lower chest: Clear lung bases. Normal heart size without pericardial or pleural effusion. Hepatobiliary: Caudate lobe enlargement. Cholecystectomy, without biliary ductal dilatation. Pancreas: Normal, without mass or ductal dilatation. Spleen: Normal in size, without focal abnormality. Adrenals/Urinary Tract: Normal adrenal glands. Punctate interpolar right renal collecting system calculus. Normal left kidney. No hydroureter or ureteric calculi. No bladder calculi. Stomach/Bowel: Normal stomach, without wall thickening. Scattered colonic diverticula. These small bowel is primarily positioned in the right-sided abdomen, within the colon positioned in the left side of the pelvis and abdomen. The ileocecal junction is in the midline, including on image 61/series 2. Appendix not identified, but no pericecal inflammation seen. Normal terminal ileum.  Otherwise normal small bowel. Vascular/Lymphatic: Aortic and branch vessel atherosclerosis. SMA/SMV relationship is atypical, with the SMV positioned anterior and just left of the SMA on image 32/series 2. No abdominopelvic adenopathy. Reproductive: Mildly lobulated the uterus, including on image 74/series 2. Normal adnexa for age. Other: No significant free fluid. Musculoskeletal: Bilateral hip osteoarthritis. Lumbosacral spondylosis. IMPRESSION: 1.  No acute process in the abdomen or pelvis. 2. Right nephrolithiasis. 3. Findings most consistent with small bowel malrotation. No evidence of complicating obstruction. 4.  Aortic atherosclerosis. 5. Suspect at least 1 uterine fibroid. Electronically Signed   By: Abigail Miyamoto M.D.   On: 12/21/2016 20:38     Procedures Procedures (including critical care time)  Medications Ordered in ED Medications  HYDROmorphone (DILAUDID) injection 1 mg (1 mg Intravenous Given 12/21/16 1817)     Initial Impression / Assessment and Plan / ED Course  I have reviewed the triage vital signs and the nursing notes.  Pertinent labs & imaging results that were available during my care of the patient were reviewed by me and considered in my medical decision making (see chart for details).     Patient with intermittent abdominal pain times past 3 weeks. She states that it has been worsening. She reports pain in her right flank, but  also describes some diffuse abdominal pain. She reports some nausea. She is afebrile. She is nontoxic-appearing. Given the amount of discomfort, question whether the patient has a ureteral stone. She did have a recent CT study which showed nephrolithiasis. Will repeat CT but with contrast today. Patient has a contrast allergy, but did take oral contrast. The right renal stone remains, and an incidental finding of small bowel malrotation was discovered. I discussed this with Dr. Lucia Gaskins from general surgery, who recommends outpatient follow-up, as there is no evidence of obstruction on CT exam tonight. He believes that this is less likely to be causing the patient's acute pain, I agree given that there is no obstruction. Patient has had her pain adequately controlled in the emergency department. I will prescribe her some pain medicine and recommend follow-up with general surgery and with urology. Patient understands and agrees with plan. She is stable and ready for discharge.  Final Clinical Impressions(s) / ED Diagnoses   Final diagnoses:  Nephrolithiasis  Intestine, malrotation    New Prescriptions Discharge Medication List as of 12/21/2016  9:23 PM     I personally performed the services described in this documentation, which was scribed in my presence. The recorded information has  been reviewed and is accurate.      Montine Circle, PA-C 12/21/16 2249    Courteney Julio Alm, MD 12/25/16 1524

## 2016-12-21 NOTE — ED Notes (Signed)
Patient transported to CT. Pt requesting pain medication. PA aware.

## 2016-12-21 NOTE — ED Notes (Signed)
ED Provider at bedside. 

## 2016-12-25 ENCOUNTER — Other Ambulatory Visit: Payer: Self-pay | Admitting: Physician Assistant

## 2016-12-25 DIAGNOSIS — I10 Essential (primary) hypertension: Secondary | ICD-10-CM

## 2016-12-29 ENCOUNTER — Other Ambulatory Visit: Payer: Self-pay | Admitting: Physician Assistant

## 2016-12-29 DIAGNOSIS — N809 Endometriosis, unspecified: Secondary | ICD-10-CM

## 2016-12-29 NOTE — Telephone Encounter (Signed)
Please advise Last OV 11/13/16

## 2016-12-29 NOTE — Telephone Encounter (Signed)
Patient needs her diazepam (VALIUM) 2 MG tablet and her HYDROcodone-acetaminophen (NORCO/VICODIN) 5-325 MG tablet refilled by [facility].    Her call back number is [phone]

## 2016-12-30 MED ORDER — HYDROCODONE-ACETAMINOPHEN 5-325 MG PO TABS: 1.0000 | ORAL_TABLET | Freq: Four times a day (QID) | ORAL | 0 refills | Status: DC | PRN

## 2016-12-30 MED ORDER — DIAZEPAM 2 MG PO TABS: 1.0000 mg | ORAL_TABLET | Freq: Two times a day (BID) | ORAL | 0 refills | Status: DC | PRN

## 2016-12-30 NOTE — Telephone Encounter (Signed)
Meds ordered this encounter  Medications  . HYDROcodone-acetaminophen (NORCO/VICODIN) 5-325 MG tablet    Sig: Take 1-2 tablets by mouth every 6 (six) hours as needed.    Dispense:  15 tablet    Refill:  0    Order Specific Question:   Supervising Provider    Answer:   Carlota Raspberry, JEFFREY R [[id number]]  . diazepam (VALIUM) 2 MG tablet    Sig: Take 0.5-1 tablets (1-2 mg total) by mouth every 12 (twelve) hours as needed for anxiety or muscle spasms.    Dispense:  40 tablet    Refill:  0    Order Specific Question:   Supervising Provider    Answer:   Carlota Raspberry, JEFFREY R [[id number]]

## 2016-12-30 NOTE — Telephone Encounter (Signed)
Ok to fill Rx I have pended them.

## 2017-01-01 NOTE — Telephone Encounter (Signed)
Up front 

## 2017-01-14 ENCOUNTER — Encounter (HOSPITAL_BASED_OUTPATIENT_CLINIC_OR_DEPARTMENT_OTHER): Payer: Self-pay | Admitting: *Deleted

## 2017-01-14 ENCOUNTER — Emergency Department (HOSPITAL_BASED_OUTPATIENT_CLINIC_OR_DEPARTMENT_OTHER): Payer: BLUE CROSS/BLUE SHIELD

## 2017-01-14 ENCOUNTER — Emergency Department (HOSPITAL_BASED_OUTPATIENT_CLINIC_OR_DEPARTMENT_OTHER)
Admission: EM | Admit: 2017-01-14 | Discharge: 2017-01-15 | Disposition: A | Discharge status: Home / Self Care | Payer: BLUE CROSS/BLUE SHIELD | Attending: Emergency Medicine | Admitting: Emergency Medicine

## 2017-01-14 DIAGNOSIS — E119 Type 2 diabetes mellitus without complications: Secondary | ICD-10-CM | POA: Insufficient documentation

## 2017-01-14 DIAGNOSIS — R1030 Lower abdominal pain, unspecified: Secondary | ICD-10-CM

## 2017-01-14 DIAGNOSIS — Z794 Long term (current) use of insulin: Secondary | ICD-10-CM | POA: Diagnosis not present

## 2017-01-14 DIAGNOSIS — F17210 Nicotine dependence, cigarettes, uncomplicated: Secondary | ICD-10-CM | POA: Insufficient documentation

## 2017-01-14 DIAGNOSIS — I10 Essential (primary) hypertension: Secondary | ICD-10-CM | POA: Insufficient documentation

## 2017-01-14 DIAGNOSIS — R1031 Right lower quadrant pain: Secondary | ICD-10-CM | POA: Diagnosis not present

## 2017-01-14 DIAGNOSIS — N200 Calculus of kidney: Secondary | ICD-10-CM | POA: Diagnosis not present

## 2017-01-14 DIAGNOSIS — Z79899 Other long term (current) drug therapy: Secondary | ICD-10-CM | POA: Insufficient documentation

## 2017-01-14 DIAGNOSIS — R1110 Vomiting, unspecified: Secondary | ICD-10-CM | POA: Insufficient documentation

## 2017-01-14 DIAGNOSIS — R197 Diarrhea, unspecified: Secondary | ICD-10-CM | POA: Insufficient documentation

## 2017-01-14 LAB — CBC WITH DIFFERENTIAL/PLATELET
Basophils Absolute: 0.1 10*3/uL (ref 0.0–0.1)
Basophils Relative: 0 %
EOS PCT: 2 %
Eosinophils Absolute: 0.3 10*3/uL (ref 0.0–0.7)
HCT: 38.1 % (ref 36.0–46.0)
HEMOGLOBIN: 12.7 g/dL (ref 12.0–15.0)
LYMPHS ABS: 4.5 10*3/uL — AB (ref 0.7–4.0)
Lymphocytes Relative: 37 %
MCH: 29.6 pg (ref 26.0–34.0)
MCHC: 33.3 g/dL (ref 30.0–36.0)
MCV: 88.8 fL (ref 78.0–100.0)
MONO ABS: 0.9 10*3/uL (ref 0.1–1.0)
MONOS PCT: 7 %
Neutro Abs: 6.4 10*3/uL (ref 1.7–7.7)
Neutrophils Relative %: 54 %
PLATELETS: 296 10*3/uL (ref 150–400)
RBC: 4.29 MIL/uL (ref 3.87–5.11)
RDW: 14.4 % (ref 11.5–15.5)
WBC: 12 10*3/uL — ABNORMAL HIGH (ref 4.0–10.5)

## 2017-01-14 LAB — URINALYSIS, ROUTINE W REFLEX MICROSCOPIC
Bilirubin Urine: NEGATIVE
Glucose, UA: NEGATIVE mg/dL
HGB URINE DIPSTICK: NEGATIVE
Ketones, ur: NEGATIVE mg/dL
Leukocytes, UA: NEGATIVE
Nitrite: NEGATIVE
PH: 6 (ref 5.0–8.0)
Protein, ur: NEGATIVE mg/dL
SPECIFIC GRAVITY, URINE: 1.025 (ref 1.005–1.030)

## 2017-01-14 LAB — BASIC METABOLIC PANEL
Anion gap: 7 (ref 5–15)
BUN: 19 mg/dL (ref 6–20)
CALCIUM: 9.3 mg/dL (ref 8.9–10.3)
CHLORIDE: 106 mmol/L (ref 101–111)
CO2: 25 mmol/L (ref 22–32)
CREATININE: 1.05 mg/dL — AB (ref 0.44–1.00)
GFR calc Af Amer: >60 mL/min (ref >60)
GFR calc non Af Amer: >60 mL/min (ref >60)
GLUCOSE: 110 mg/dL — AB (ref 65–99)
Potassium: 3.6 mmol/L (ref 3.5–5.1)
Sodium: 138 mmol/L (ref 135–145)

## 2017-01-14 LAB — HEPATIC FUNCTION PANEL
ALT: 17 U/L (ref 14–54)
AST: 16 U/L (ref 15–41)
Albumin: 3.9 g/dL (ref 3.5–5.0)
Alkaline Phosphatase: 75 U/L (ref 38–126)
Total Bilirubin: 0.5 mg/dL (ref 0.3–1.2)
Total Protein: 7.2 g/dL (ref 6.5–8.1)

## 2017-01-14 LAB — LIPASE, BLOOD: LIPASE: 59 U/L — AB (ref 11–51)

## 2017-01-14 LAB — I-STAT CG4 LACTIC ACID, ED: LACTIC ACID, VENOUS: 0.86 mmol/L (ref 0.5–1.9)

## 2017-01-14 MED ORDER — HYDROCODONE-ACETAMINOPHEN 5-325 MG PO TABS
1.0000 | ORAL_TABLET | Freq: Once | ORAL | Status: AC
  Administered 2017-01-14: 1 via ORAL
  Filled 2017-01-14: qty 1

## 2017-01-14 MED ORDER — HYDROMORPHONE HCL 1 MG/ML IJ SOLN
1.0000 mg | Freq: Once | INTRAMUSCULAR | Status: AC
  Administered 2017-01-14: 1 mg via INTRAVENOUS
  Filled 2017-01-14: qty 1

## 2017-01-14 MED ORDER — METOCLOPRAMIDE HCL 5 MG/ML IJ SOLN
10.0000 mg | INTRAMUSCULAR | Status: AC
  Administered 2017-01-14: 10 mg via INTRAVENOUS
  Filled 2017-01-14: qty 2

## 2017-01-14 NOTE — ED Notes (Signed)
Pt transported to and from CT.

## 2017-01-14 NOTE — ED Triage Notes (Signed)
Pt with abdominal pain-evaluated for same on 3.27.18, reports intestinal twisting.  States that she has f/u with surgeon next week.

## 2017-01-14 NOTE — ED Notes (Signed)
Pt has started her second bottle of barium for CT

## 2017-01-14 NOTE — Discharge Instructions (Signed)
STAY ON A LIQUID DIET WITH SMALL PORTIONS AND NO GREASY OR SPICY MEALS UNTIL YOU FOLLOW UP WITH GENERAL SURGERY. RETURN TO ED IF ANY BLOODY STOOLS, BLOODY EMESIS, FEVER, OR WORSENING SYMPTOMS.

## 2017-01-14 NOTE — ED Provider Notes (Signed)
[facility] Provider Note   CSN: [id number] Arrival date & time: 01/14/17  1739  By signing my name below, I, Sonum Patel, attest that this documentation has been prepared under the direction and in the presence of Sharlett Iles, MD. Electronically Signed: Sonum Patel, Education administrator. 01/14/17. 6:57 PM.  History   Chief Complaint Chief Complaint  Patient presents with  . Abdominal Pain    The history is provided by the patient. No language interpreter was used.     HPI Comments: [name] is a 51 y.o. female who presents to the Emergency Department complaining of an episode of RLQ abdominal pain that began today. She reports intermittent RLQ abdominal pain that has been ongoing for about 1 month, since she was seen here on 12/21/16. She reports associated mucous-like diarrhea and 1 episode of vomiting today. She has taken an ibuprofen earlier today without significant relief. She reports eating well today. She denies hematuria, dysuria, fever, cough, sore throat, CP, SOB. She has an appointment with general surgery on 01/20/17.   Past Medical History:  Diagnosis Date  . Anxiety   . Arthritis   . Diabetes mellitus   . Endometriosis   . Hypersomnia, persistent    epworth 16   . Hypertension   . Lyme borreliosis   . Migraine   . Morbid obesity ([place])   . Obesity, Class III, BMI 40-49.9 (morbid obesity) ([place])   . Obstructive sleep apnea    CPAP NIGHTLY; sleep study 2007.  Marland Kitchen Renal disorder    kidney stone    Patient Active Problem List   Diagnosis Date Noted  . Anxiety 03/08/2016  . Chest pain 02/20/2016  . Vitamin D insufficiency 09/25/2015  . Endometriosis 01/17/2014  . Sleep apnea with use of continuous positive airway pressure (CPAP) 06/27/2013  . Nocturia 06/27/2013  . Obesity, Class III, BMI 40-49.9 (morbid obesity) ([place])   . Obstructive sleep apnea 02/15/2013  . Hypersomnia, persistent   . HTN (hypertension) 02/03/2013  . DM (diabetes mellitus), type  2 ([place]) 02/03/2013    Past Surgical History:  Procedure Laterality Date  . ABLATION ON ENDOMETRIOSIS    . ABLATION ON ENDOMETRIOSIS     10 years ago  . CARDIAC CATHETERIZATION N/A 02/20/2016   Procedure: Left Heart Cath and Coronary Angiography;  Surgeon: [name], MD;  Location: [facility];  Service: Cardiovascular;  Laterality: N/A;  . CHOLECYSTECTOMY    . KNEE ARTHROCENTESIS    . KNEE SURGERY    . TUBAL LIGATION      OB History    No data available       Home Medications    Prior to Admission medications   Medication Sig Start Date End Date Taking? Authorizing Provider  amoxicillin-clavulanate (AUGMENTIN) 875-125 MG tablet Take 1 tablet by mouth 2 (two) times daily. 11/13/16   Mancel Bale, PA-C  celecoxib (CELEBREX) 200 MG capsule Take 1 capsule (200 mg total) by mouth daily. 06/01/16   Mancel Bale, PA-C  chlorpheniramine-HYDROcodone (TUSSIONEX PENNKINETIC ER) 10-8 MG/5ML SUER Take 5 mLs by mouth 2 (two) times daily as needed for cough. 11/13/16   Mancel Bale, PA-C  cyclobenzaprine (FLEXERIL) 5 MG tablet TAKE 1 TABLET (5 MG TOTAL) BY MOUTH 3 (THREE) TIMES DAILY AS NEEDED FOR MUSCLE SPASMS. 06/01/16   Mancel Bale, PA-C  diazepam (VALIUM) 2 MG tablet Take 0.5-1 tablets (1-2 mg total) by mouth every 12 (twelve) hours as needed for anxiety or muscle spasms. 12/30/16  Chelle Jeffery, PA-C  FLUoxetine (PROZAC) 20 MG capsule Take 1 capsule (20 mg total) by mouth 3 (three) times daily. Patient taking differently: Take 20 mg by mouth 2 (two) times daily.  09/08/16   Mancel Bale, PA-C  furosemide (LASIX) 20 MG tablet Take 0.5-1 tablets (10-20 mg total) by mouth daily. 06/01/16   Mancel Bale, PA-C  glucose blood test strip Use as instructed 06/01/16   Mancel Bale, PA-C  HYDROcodone-acetaminophen (NORCO/VICODIN) 5-325 MG tablet Take 1-2 tablets by mouth every 6 (six) hours as needed. 12/30/16   Chelle Jeffery, PA-C  ibuprofen (ADVIL,MOTRIN) 800 MG tablet Take 1 tablet (800 mg  total) by mouth 3 (three) times daily. 09/08/16   Mancel Bale, PA-C  Insulin Glargine (TOUJEO SOLOSTAR) 300 UNIT/ML SOPN Inject 80 Units into the skin daily. 09/08/16   Mancel Bale, PA-C  liraglutide (VICTOZA) 18 MG/3ML SOPN INJECT 0.3MLS SUBCUTANEOUSLY DAILY 11/22/16   Mancel Bale, PA-C  Lorcaserin HCl 10 MG TABS Take 10 mg by mouth daily. 11/13/16   Mancel Bale, PA-C  metoprolol (LOPRESSOR) 50 MG tablet TAKE 1 TABLET BY MOUTH TWICE A DAY 12/25/16   Mancel Bale, PA-C  NOVOTWIST 32G X 5 MM MISC USE 1 APPLICATION BY DOES NOT APPLY ROUTE DAILY. 06/01/16   Mancel Bale, PA-C  rosuvastatin (CRESTOR) 40 MG tablet Take 1 tablet (40 mg total) by mouth at bedtime. 09/08/16   Mancel Bale, PA-C  triamcinolone (NASACORT AQ) 55 MCG/ACT AERO nasal inhaler Place 2 sprays into the nose daily. 06/01/16   Mancel Bale, PA-C    Family History Family History  Problem Relation Age of Onset  . Diabetes Mother   . Thyroid disease Mother   . Depression Mother     with anxiety  . Stroke Mother   . Diabetes Father   . Hypertension Father   . Diabetes Sister   . Hypertension Sister   . Hypertension Brother     Social History Social History  Substance Use Topics  . Smoking status: Current Every Day Smoker    Packs/day: 0.20    Years: 0.00    Types: Cigarettes  . Smokeless tobacco: Never Used  . Alcohol use No     Allergies   Contrast media [iodinated diagnostic agents]; Darvocet [propoxyphene n-acetaminophen]; Lisinopril; Metformin and related; Morphine and related; Percocet [oxycodone-acetaminophen]; Wellbutrin [bupropion hcl]; and Zofran [ondansetron hcl]   Review of Systems Review of Systems  Constitutional: Negative for fever.  HENT: Negative for sore throat.   Respiratory: Negative for cough and shortness of breath.   Cardiovascular: Negative for chest pain.  Gastrointestinal: Positive for abdominal pain, diarrhea and vomiting.  Genitourinary: Negative for dysuria and hematuria.    All other systems reviewed and are negative.  Physical Exam Updated Vital Signs BP 134/66 (BP Location: Right Arm)   Pulse (!) 55   Temp 98.8 F (37.1 C) (Oral)   Resp 16   Ht 5\' 1"  (1.549 m)   Wt 237 lb (107.5 kg)   SpO2 96%   BMI 44.78 kg/m   Physical Exam  Constitutional: She is oriented to person, place, and time. She appears well-developed and well-nourished.  In mild distress due to pain  HENT:  Head: Normocephalic and atraumatic.  Moist mucous membranes  Eyes: Conjunctivae are normal.  Neck: Neck supple.  Cardiovascular: Normal rate, regular rhythm and normal heart sounds.   No murmur heard. Pulmonary/Chest: Effort normal and breath sounds normal.  Abdominal: Soft.  She exhibits no distension and no mass. There is tenderness (RLQ). There is no rebound and no guarding.  Hypoactive bowel sounds  Musculoskeletal: She exhibits no edema.  Neurological: She is alert and oriented to person, place, and time.  Fluent speech  Skin: Skin is warm and dry.  Psychiatric: She has a normal mood and affect. Judgment normal.  Nursing note and vitals reviewed.    ED Treatments / Results  DIAGNOSTIC STUDIES: Oxygen Saturation is 100% on RA, normal by my interpretation.    COORDINATION OF CARE: 6:42 PM Discussed treatment plan with pt at bedside and pt agreed to plan.   Labs (all labs ordered are listed, but only abnormal results are displayed) Labs Reviewed  BASIC METABOLIC PANEL - Abnormal; Notable for the following:       Result Value   Glucose, Bld 110 (*)    Creatinine, Ser 1.05 (*)    All other components within normal limits  CBC WITH DIFFERENTIAL/PLATELET - Abnormal; Notable for the following:    WBC 12.0 (*)    Lymphs Abs 4.5 (*)    All other components within normal limits  LIPASE, BLOOD - Abnormal; Notable for the following:    Lipase 59 (*)    All other components within normal limits  HEPATIC FUNCTION PANEL - Abnormal; Notable for the following:     Bilirubin, Direct <0.1 (*)    All other components within normal limits  URINALYSIS, ROUTINE W REFLEX MICROSCOPIC  I-STAT CG4 LACTIC ACID, ED    EKG  EKG Interpretation None       Radiology Ct Abdomen Pelvis Wo Contrast  Result Date: 01/14/2017 CLINICAL DATA:  Right lower quadrant pain and tenderness EXAM: CT ABDOMEN AND PELVIS WITHOUT CONTRAST TECHNIQUE: Multidetector CT imaging of the abdomen and pelvis was performed following the standard protocol without IV contrast. COMPARISON:  12/21/2016 FINDINGS: Lower chest: Lung bases demonstrate no acute consolidation or pleural effusion. Normal heart size. Hepatobiliary: No focal hepatic abnormality. Surgical clips in the gallbladder fossa. No biliary dilatation. Pancreas: Unremarkable. No pancreatic ductal dilatation or surrounding inflammatory changes. Spleen: Normal in size without focal abnormality. Adrenals/Urinary Tract: Adrenal glands are within normal limits. No hydronephrosis. Punctate nonobstructing stones in the right kidney. The bladder is normal. Stomach/Bowel: The stomach is slightly enlarged. No dilated small bowel. Again visualized is now rotation with small bowel on the right and colon on the left. Midline cecum. Colon diverticula without acute inflammation or wall thickening. Vascular/Lymphatic: Atherosclerotic calcifications. No enlarged abdominal or pelvic nodes. Atypical relationship of SMA and SMV as before. Reproductive: Uterine fibroid.  No adnexal mass. Other: Small fat containing periumbilical hernia. No free air. No free fluid. Musculoskeletal: Degenerative changes. No acute or suspicious bone lesion IMPRESSION: 1. No inflammatory process is visualized in the right lower quadrant of the abdomen 2. Findings consistent with bowel malrotation. There is no evidence for a bowel obstruction 3. Punctate nonobstructing stones in the right kidney Electronically Signed   By: Donavan Foil M.D.   On: 01/14/2017 21:02     Procedures Procedures (including critical care time)  Medications Ordered in ED Medications  metoCLOPramide (REGLAN) injection 10 mg (10 mg Intravenous Given 01/14/17 1853)  HYDROmorphone (DILAUDID) injection 1 mg (1 mg Intravenous Given 01/14/17 1853)  HYDROmorphone (DILAUDID) injection 1 mg (1 mg Intravenous Given 01/14/17 2114)  HYDROcodone-acetaminophen (NORCO/VICODIN) 5-325 MG per tablet 1 tablet (1 tablet Oral Given 01/14/17 2355)     Initial Impression / Assessment and Plan / ED Course  I  have reviewed the triage vital signs and the nursing notes.  Pertinent labs & imaging results that were available during my care of the patient were reviewed by me and considered in my medical decision making (see chart for details).     Pt w/ abd pain, 1 episode of vomiting, h/o similar Sensation last week when she was diagnosed with malrotation. She was uncomfortable on exam, abdomen was soft but she did have lower abdominal tenderness worst in her right lower quadrant. No rebound or peritonitis. Afebrile and vital signs stable. Her lab work was unremarkable, normal lactate, WBC 12. As of vomiting and concern for possible development of obstruction, obtained CT of abdomen and pelvis which showed persistent bowel malrotation but no evidence of obstruction and no other acute findings including no inflammation in right lower quadrant. I discussed the findings and presentation with general surgeon on call, Dr. Elinor Parkinson, appreciate assistance. He reviewed imaging and felt that her current presentation was unrelated to the findings of malrotation. He recommended liquid diet with small meals and keeping her current appointment next week with general surgery. I relayed this information to the patient who was well-appearing on reexamination and voiced understanding of treatment plan and return precautions. Patient discharged in satisfactory condition after successful PO challenge.  Final Clinical  Impressions(s) / ED Diagnoses   Final diagnoses:  Lower abdominal pain    New Prescriptions Discharge Medication List as of 01/14/2017 11:52 PM     I personally performed the services described in this documentation, which was scribed in my presence. The recorded information has been reviewed and is accurate.    Sharlett Iles, MD 01/15/17 [phone]

## 2017-01-15 NOTE — ED Notes (Signed)
Pt verbalizes understanding of d/c instructions and denies any further needs at this time. 

## 2017-01-24 ENCOUNTER — Other Ambulatory Visit: Payer: Self-pay | Admitting: Physician Assistant

## 2017-01-24 DIAGNOSIS — E119 Type 2 diabetes mellitus without complications: Secondary | ICD-10-CM

## 2017-02-07 ENCOUNTER — Other Ambulatory Visit: Payer: Self-pay | Admitting: Family Medicine

## 2017-02-07 DIAGNOSIS — N809 Endometriosis, unspecified: Secondary | ICD-10-CM

## 2017-02-07 NOTE — Telephone Encounter (Signed)
12/30/16 last refill of hydrocodone 2/18 last ov

## 2017-02-07 NOTE — Telephone Encounter (Signed)
[name] PT CALLING FOR A REFILL ON VALIUM IBUPROFEN HYDROCODONE

## 2017-02-08 MED ORDER — HYDROCODONE-ACETAMINOPHEN 5-325 MG PO TABS
1.0000 | ORAL_TABLET | Freq: Four times a day (QID) | ORAL | 0 refills | Status: DC | PRN
Start: 2017-02-08 — End: 2017-03-14

## 2017-02-08 MED ORDER — IBUPROFEN 800 MG PO TABS: 800.0000 mg | ORAL_TABLET | Freq: Three times a day (TID) | ORAL | 1 refills | Status: DC

## 2017-02-08 MED ORDER — DIAZEPAM 2 MG PO TABS: 1.0000 mg | ORAL_TABLET | Freq: Two times a day (BID) | ORAL | 0 refills | Status: DC | PRN

## 2017-02-08 NOTE — Telephone Encounter (Signed)
Ready to pick up - she needs to schedule an appt within a month for DM lab work

## 2017-02-08 NOTE — Telephone Encounter (Signed)
Pt aware script is ready for pick up  Advised to schedule ov before next refill can be given

## 2017-02-28 ENCOUNTER — Emergency Department (HOSPITAL_BASED_OUTPATIENT_CLINIC_OR_DEPARTMENT_OTHER)
Admission: EM | Admit: 2017-02-28 | Discharge: 2017-02-28 | Disposition: A | Discharge status: Home / Self Care | Payer: BLUE CROSS/BLUE SHIELD | Attending: Emergency Medicine | Admitting: Emergency Medicine

## 2017-02-28 ENCOUNTER — Encounter (HOSPITAL_BASED_OUTPATIENT_CLINIC_OR_DEPARTMENT_OTHER): Payer: Self-pay | Admitting: *Deleted

## 2017-02-28 DIAGNOSIS — M25532 Pain in left wrist: Secondary | ICD-10-CM | POA: Diagnosis not present

## 2017-02-28 DIAGNOSIS — Z7901 Long term (current) use of anticoagulants: Secondary | ICD-10-CM | POA: Diagnosis not present

## 2017-02-28 DIAGNOSIS — Y999 Unspecified external cause status: Secondary | ICD-10-CM | POA: Diagnosis not present

## 2017-02-28 DIAGNOSIS — M2550 Pain in unspecified joint: Secondary | ICD-10-CM

## 2017-02-28 DIAGNOSIS — R51 Headache: Secondary | ICD-10-CM | POA: Insufficient documentation

## 2017-02-28 DIAGNOSIS — Y92009 Unspecified place in unspecified non-institutional (private) residence as the place of occurrence of the external cause: Secondary | ICD-10-CM | POA: Diagnosis not present

## 2017-02-28 DIAGNOSIS — Z794 Long term (current) use of insulin: Secondary | ICD-10-CM | POA: Insufficient documentation

## 2017-02-28 DIAGNOSIS — R110 Nausea: Secondary | ICD-10-CM

## 2017-02-28 DIAGNOSIS — R112 Nausea with vomiting, unspecified: Secondary | ICD-10-CM | POA: Insufficient documentation

## 2017-02-28 DIAGNOSIS — Z79899 Other long term (current) drug therapy: Secondary | ICD-10-CM | POA: Insufficient documentation

## 2017-02-28 DIAGNOSIS — Y93H2 Activity, gardening and landscaping: Secondary | ICD-10-CM | POA: Insufficient documentation

## 2017-02-28 DIAGNOSIS — W57XXXA Bitten or stung by nonvenomous insect and other nonvenomous arthropods, initial encounter: Secondary | ICD-10-CM | POA: Insufficient documentation

## 2017-02-28 DIAGNOSIS — F17210 Nicotine dependence, cigarettes, uncomplicated: Secondary | ICD-10-CM | POA: Insufficient documentation

## 2017-02-28 DIAGNOSIS — R519 Headache, unspecified: Secondary | ICD-10-CM

## 2017-02-28 DIAGNOSIS — M25531 Pain in right wrist: Secondary | ICD-10-CM | POA: Diagnosis not present

## 2017-02-28 DIAGNOSIS — E119 Type 2 diabetes mellitus without complications: Secondary | ICD-10-CM | POA: Insufficient documentation

## 2017-02-28 DIAGNOSIS — I10 Essential (primary) hypertension: Secondary | ICD-10-CM | POA: Diagnosis not present

## 2017-02-28 LAB — CBC WITH DIFFERENTIAL/PLATELET
Basophils Absolute: 0.1 10*3/uL (ref 0.0–0.1)
Basophils Relative: 1 %
Eosinophils Absolute: 0.3 10*3/uL (ref 0.0–0.7)
Eosinophils Relative: 3 %
HCT: 38.7 % (ref 36.0–46.0)
Hemoglobin: 13 g/dL (ref 12.0–15.0)
Lymphocytes Relative: 28 %
Lymphs Abs: 2.7 10*3/uL (ref 0.7–4.0)
MCH: 29 pg (ref 26.0–34.0)
MCHC: 33.6 g/dL (ref 30.0–36.0)
MCV: 86.2 fL (ref 78.0–100.0)
Monocytes Absolute: 0.6 10*3/uL (ref 0.1–1.0)
Monocytes Relative: 6 %
Neutro Abs: 6.1 10*3/uL (ref 1.7–7.7)
Neutrophils Relative %: 62 %
Platelets: 283 10*3/uL (ref 150–400)
RBC: 4.49 MIL/uL (ref 3.87–5.11)
RDW: 14 % (ref 11.5–15.5)
WBC: 9.7 10*3/uL (ref 4.0–10.5)

## 2017-02-28 LAB — BASIC METABOLIC PANEL
Anion gap: 8 (ref 5–15)
BUN: 21 mg/dL — ABNORMAL HIGH (ref 6–20)
CO2: 22 mmol/L (ref 22–32)
Calcium: 9.1 mg/dL (ref 8.9–10.3)
Chloride: 108 mmol/L (ref 101–111)
Creatinine, Ser: 0.64 mg/dL (ref 0.44–1.00)
GFR calc Af Amer: >60 mL/min (ref >60)
GFR calc non Af Amer: >60 mL/min (ref >60)
Glucose, Bld: 111 mg/dL — ABNORMAL HIGH (ref 65–99)
Potassium: 3.8 mmol/L (ref 3.5–5.1)
Sodium: 138 mmol/L (ref 135–145)

## 2017-02-28 LAB — CBG MONITORING, ED: Glucose-Capillary: 113 mg/dL — ABNORMAL HIGH (ref 65–99)

## 2017-02-28 MED ORDER — KETOROLAC TROMETHAMINE 30 MG/ML IJ SOLN
30.0000 mg | Freq: Once | INTRAMUSCULAR | Status: AC
  Administered 2017-02-28: 30 mg via INTRAVENOUS
  Filled 2017-02-28: qty 1

## 2017-02-28 MED ORDER — MAGNESIUM SULFATE 2 GM/50ML IV SOLN
2.0000 g | Freq: Once | INTRAVENOUS | Status: AC
  Administered 2017-02-28: 2 g via INTRAVENOUS
  Filled 2017-02-28: qty 50

## 2017-02-28 MED ORDER — HALOPERIDOL LACTATE 5 MG/ML IJ SOLN
5.0000 mg | Freq: Once | INTRAMUSCULAR | Status: AC
  Administered 2017-02-28: 5 mg via INTRAVENOUS
  Filled 2017-02-28: qty 1

## 2017-02-28 MED ORDER — SODIUM CHLORIDE 0.9 % IV BOLUS (SEPSIS)
1000.0000 mL | Freq: Once | INTRAVENOUS | Status: AC
  Administered 2017-02-28: 1000 mL via INTRAVENOUS

## 2017-02-28 MED ORDER — PROCHLORPERAZINE MALEATE 10 MG PO TABS
10.0000 mg | ORAL_TABLET | Freq: Once | ORAL | Status: AC
  Administered 2017-02-28: 10 mg via ORAL
  Filled 2017-02-28: qty 1

## 2017-02-28 MED ORDER — DIPHENHYDRAMINE HCL 50 MG/ML IJ SOLN
25.0000 mg | Freq: Once | INTRAMUSCULAR | Status: AC
  Administered 2017-02-28: 25 mg via INTRAVENOUS
  Filled 2017-02-28: qty 1

## 2017-02-28 MED ORDER — DOXYCYCLINE HYCLATE 100 MG PO CAPS: 100.0000 mg | ORAL_CAPSULE | Freq: Two times a day (BID) | ORAL | 0 refills | Status: AC

## 2017-02-28 MED ORDER — DOXYCYCLINE HYCLATE 100 MG PO TABS
200.0000 mg | ORAL_TABLET | Freq: Once | ORAL | Status: AC
  Administered 2017-02-28: 200 mg via ORAL
  Filled 2017-02-28: qty 2

## 2017-02-28 NOTE — ED Notes (Signed)
ED Provider at bedside. 

## 2017-02-28 NOTE — Discharge Instructions (Signed)
Please take all of your antibiotics until finished!   You may develop abdominal discomfort or diarrhea from the antibiotic.  You may help offset this with probiotics which you can buy or get in yogurt. Do not eat  or take the probiotics until 2 hours after your antibiotic. Continue taking your migraine medications. Follow-up with your primary care within 1 week for reevaluation of your symptoms. Return to the ED if any concerning symptoms develop.

## 2017-02-28 NOTE — ED Triage Notes (Signed)
Dizzy, headache, nausea. States she pulled a tick off her abdomen this am.

## 2017-02-28 NOTE — ED Provider Notes (Signed)
[facility] Provider Note   CSN: [id number] Arrival date & time: 02/28/17  1301     History   Chief Complaint Chief Complaint  Patient presents with  . Tick Bite    HPI [name] is a 51 y.o. female with history of DM, anxiety, Lyme's disease, HTN, migraines with chief complaint gradual onset, gradually worsening frontal headache with associated nausea and lightheadedness for 3 days. She states headache is constant and throbbing in nature primarily in her temporal region and retro-orbital with radiation today to the occipital region. She states this headache is no more severe than any other headache she has had. She endorses associated lightheadedness, photophobia, and nausea. She notes she found a very small tick to her abdomen earlier today which she pulled off her body, but states she thinks the head is still attached. She thinks the tick was attached for possibly up to 3 days. She was doing yardwork before all of her symptoms began. She also endorses development of generalized joint pains of bilateral wrists, elbows, and knees. She has tried ibuprofen, Vicodin, and Valium for her symptoms with some relief. She states she has had Lyme's disease in the past and this feels similarly. She denies numbness, tingling, weakness, fever, chills, chest pain, shortness of breath, abdominal pain and vomiting. The history is provided by the patient.    Past Medical History:  Diagnosis Date  . Anxiety   . Arthritis   . Diabetes mellitus   . Endometriosis   . Hypersomnia, persistent    epworth 16   . Hypertension   . Lyme borreliosis   . Migraine   . Morbid obesity ([place])   . Obesity, Class III, BMI 40-49.9 (morbid obesity) ([place])   . Obstructive sleep apnea    CPAP NIGHTLY; sleep study 2007.  Marland Kitchen Renal disorder    kidney stone    Patient Active Problem List   Diagnosis Date Noted  . Anxiety 03/08/2016  . Chest pain 02/20/2016  . Vitamin D insufficiency 09/25/2015  .  Endometriosis 01/17/2014  . Sleep apnea with use of continuous positive airway pressure (CPAP) 06/27/2013  . Nocturia 06/27/2013  . Obesity, Class III, BMI 40-49.9 (morbid obesity) ([place])   . Obstructive sleep apnea 02/15/2013  . Hypersomnia, persistent   . HTN (hypertension) 02/03/2013  . DM (diabetes mellitus), type 2 ([place]) 02/03/2013    Past Surgical History:  Procedure Laterality Date  . ABLATION ON ENDOMETRIOSIS    . ABLATION ON ENDOMETRIOSIS     10 years ago  . CARDIAC CATHETERIZATION N/A 02/20/2016   Procedure: Left Heart Cath and Coronary Angiography;  Surgeon: [name], MD;  Location: [facility];  Service: Cardiovascular;  Laterality: N/A;  . CHOLECYSTECTOMY    . KNEE ARTHROCENTESIS    . KNEE SURGERY    . TUBAL LIGATION      OB History    No data available       Home Medications    Prior to Admission medications   Medication Sig Start Date End Date Taking? Authorizing Provider  amoxicillin-clavulanate (AUGMENTIN) 875-125 MG tablet Take 1 tablet by mouth 2 (two) times daily. 11/13/16   Gale Journey, Damaris Hippo, PA-C  celecoxib (CELEBREX) 200 MG capsule Take 1 capsule (200 mg total) by mouth daily. 06/01/16   Weber, Damaris Hippo, PA-C  chlorpheniramine-HYDROcodone (TUSSIONEX PENNKINETIC ER) 10-8 MG/5ML SUER Take 5 mLs by mouth 2 (two) times daily as needed for cough. 11/13/16   Gale Journey, Damaris Hippo, PA-C  cyclobenzaprine (FLEXERIL)  5 MG tablet TAKE 1 TABLET (5 MG TOTAL) BY MOUTH 3 (THREE) TIMES DAILY AS NEEDED FOR MUSCLE SPASMS. 06/01/16   Gale Journey, Damaris Hippo, PA-C  diazepam (VALIUM) 2 MG tablet Take 0.5-1 tablets (1-2 mg total) by mouth every 12 (twelve) hours as needed for anxiety or muscle spasms. 02/08/17   Weber, Damaris Hippo, PA-C  FLUoxetine (PROZAC) 20 MG capsule Take 1 capsule (20 mg total) by mouth 3 (three) times daily. Patient taking differently: Take 20 mg by mouth 2 (two) times daily.  09/08/16   Weber, Damaris Hippo, PA-C  furosemide (LASIX) 20 MG tablet Take 0.5-1 tablets (10-20 mg  total) by mouth daily. 06/01/16   Weber, Damaris Hippo, PA-C  glucose blood test strip Use as instructed 06/01/16   Weber, Damaris Hippo, PA-C  HYDROcodone-acetaminophen (NORCO/VICODIN) 5-325 MG tablet Take 1-2 tablets by mouth every 6 (six) hours as needed. 02/08/17   Weber, Damaris Hippo, PA-C  ibuprofen (ADVIL,MOTRIN) 800 MG tablet Take 1 tablet (800 mg total) by mouth 3 (three) times daily. 02/08/17   Weber, Damaris Hippo, PA-C  liraglutide (VICTOZA) 18 MG/3ML SOPN INJECT 0.3MLS SUBCUTANEOUSLY DAILY 11/22/16   Gale Journey, Damaris Hippo, PA-C  Lorcaserin HCl 10 MG TABS Take 10 mg by mouth daily. 11/13/16   Weber, Damaris Hippo, PA-C  metoprolol (LOPRESSOR) 50 MG tablet TAKE 1 TABLET BY MOUTH TWICE A DAY 12/25/16   Weber, Sarah L, PA-C  NOVOTWIST 32G X 5 MM MISC USE 1 APPLICATION BY DOES NOT APPLY ROUTE DAILY. 06/01/16   Gale Journey, Damaris Hippo, PA-C  rosuvastatin (CRESTOR) 40 MG tablet Take 1 tablet (40 mg total) by mouth at bedtime. 09/08/16   Weber, Sarah L, PA-C  TOUJEO SOLOSTAR 300 UNIT/ML SOPN INJECT 80 UNITS INTO THE SKIN DAILY. 01/25/17   Weber, Damaris Hippo, PA-C  triamcinolone (NASACORT AQ) 55 MCG/ACT AERO nasal inhaler Place 2 sprays into the nose daily. 06/01/16   Gale Journey, Damaris Hippo, PA-C    Family History Family History  Problem Relation Age of Onset  . Diabetes Mother   . Thyroid disease Mother   . Depression Mother        with anxiety  . Stroke Mother   . Diabetes Father   . Hypertension Father   . Diabetes Sister   . Hypertension Sister   . Hypertension Brother     Social History Social History  Substance Use Topics  . Smoking status: Current Every Day Smoker    Packs/day: 0.20    Years: 0.00    Types: Cigarettes  . Smokeless tobacco: Never Used  . Alcohol use No     Allergies   Contrast media [iodinated diagnostic agents]; Darvocet [propoxyphene n-acetaminophen]; Lisinopril; Metformin and related; Morphine and related; Percocet [oxycodone-acetaminophen]; Wellbutrin [bupropion hcl]; and Zofran [ondansetron hcl]   Review of  Systems Review of Systems  Constitutional: Negative for chills and fever.  Respiratory: Negative for shortness of breath.   Cardiovascular: Negative for chest pain.  Gastrointestinal: Negative for abdominal pain, diarrhea, nausea and vomiting.  Musculoskeletal: Positive for arthralgias.  All other systems reviewed and are negative.    Physical Exam Updated Vital Signs BP (!) 142/63 (BP Location: Right Arm)   Pulse 71   Temp 98.4 F (36.9 C) (Oral)   Resp 18   Ht 5' 1.5" (1.562 m)   Wt 112.5 kg (248 lb)   SpO2 99%   BMI 46.10 kg/m   Physical Exam  Constitutional: She is oriented to person, place, and time. She appears well-developed and well-nourished.  HENT:  Head: Normocephalic and atraumatic.  Eyes: Conjunctivae and EOM are normal. Pupils are equal, round, and reactive to light. Right eye exhibits no discharge. Left eye exhibits no discharge.  Neck: No JVD present. No tracheal deviation present.  Cardiovascular: Normal rate, regular rhythm, normal heart sounds and intact distal pulses.   Pulmonary/Chest: Effort normal and breath sounds normal.  Abdominal: Soft. Bowel sounds are normal. She exhibits no distension. There is no tenderness.  Musculoskeletal: Normal range of motion. She exhibits no tenderness.       Right shoulder: Normal.       Left shoulder: Normal.       Right elbow: Normal.      Left elbow: Normal.       Right wrist: Normal.       Left wrist: Normal.       Right hip: Normal.       Left hip: Normal.       Right knee: Normal.       Left knee: Normal.       Right ankle: Normal.       Left ankle: Normal.       Cervical back: Normal.       Thoracic back: Normal.       Lumbar back: Normal.  Neurological: She is alert and oriented to person, place, and time. No cranial nerve deficit or sensory deficit.  Fluent speech, no facial droop, sensation intact to soft touch globally, normal gait   Skin: Skin is warm and dry. Capillary refill takes less than 2  seconds.  Tick head appears embedded in the skin of left abdomen with surrounding erythema of around 1cm. No bull's-eye rash, fluctuance, tenderness, or discharge noted.  Psychiatric: She has a normal mood and affect. Her behavior is normal.     ED Treatments / Results  Labs (all labs ordered are listed, but only abnormal results are displayed) Labs Reviewed  BASIC METABOLIC PANEL - Abnormal; Notable for the following:       Result Value   Glucose, Bld 111 (*)    BUN 21 (*)    All other components within normal limits  CBG MONITORING, ED - Abnormal; Notable for the following:    Glucose-Capillary 113 (*)    All other components within normal limits  CBC WITH DIFFERENTIAL/PLATELET    EKG  EKG Interpretation None       Radiology No results found.  Procedures Procedures (including critical care time)  Medications Ordered in ED Medications  prochlorperazine (COMPAZINE) tablet 10 mg (10 mg Oral Given 02/28/17 1357)  diphenhydrAMINE (BENADRYL) injection 25 mg (25 mg Intravenous Given 02/28/17 1404)  sodium chloride 0.9 % bolus 1,000 mL (0 mLs Intravenous Stopped 02/28/17 1456)  doxycycline (VIBRA-TABS) tablet 200 mg (200 mg Oral Given 02/28/17 1357)  ketorolac (TORADOL) 30 MG/ML injection 30 mg (30 mg Intravenous Given 02/28/17 1443)  magnesium sulfate IVPB 2 g 50 mL (0 g Intravenous Stopped 02/28/17 1545)  haloperidol lactate (HALDOL) injection 5 mg (5 mg Intravenous Given 02/28/17 1636)     Initial Impression / Assessment and Plan / ED Course  I have reviewed the triage vital signs and the nursing notes.  Pertinent labs & imaging results that were available during my care of the patient were reviewed by me and considered in my medical decision making (see chart for details).     Patient with complaint of headache, nausea, multiple arthralgias and tick bite. Afebrile, vital signs are stable here. No focal neurological  deficits. Low suspicion of CVA, ICH, or meningitis. Pain improved  with fluids and medication while in the ED. Per up-to-date recommendations, tick head will most likely be spontaneously expelled. Concern for Lyme's disease versus [facility] spotted fever. Given first dose doxycycline while in the ED. Will discharge with Rx for 21 day course. She'll follow-up with her primary care physician for reevaluation. Discussed indications for return to the ED. Pt verbalized understanding of and agreement with plan and is safe for discharge home at this time.   Final Clinical Impressions(s) / ED Diagnoses   Final diagnoses:  Bad headache  Nausea  Tick bite, initial encounter  Arthralgia of multiple joints    New Prescriptions New Prescriptions   No medications on file     Debroah Baller 02/28/17 1704    Gareth Morgan, MD 03/04/17 [phone]

## 2017-03-14 ENCOUNTER — Emergency Department (HOSPITAL_BASED_OUTPATIENT_CLINIC_OR_DEPARTMENT_OTHER)
Admission: EM | Admit: 2017-03-14 | Discharge: 2017-03-14 | Disposition: A | Discharge status: Home / Self Care | Payer: BLUE CROSS/BLUE SHIELD | Attending: Emergency Medicine | Admitting: Emergency Medicine

## 2017-03-14 ENCOUNTER — Emergency Department (HOSPITAL_BASED_OUTPATIENT_CLINIC_OR_DEPARTMENT_OTHER): Payer: BLUE CROSS/BLUE SHIELD

## 2017-03-14 ENCOUNTER — Encounter (HOSPITAL_BASED_OUTPATIENT_CLINIC_OR_DEPARTMENT_OTHER): Payer: Self-pay | Admitting: *Deleted

## 2017-03-14 DIAGNOSIS — R109 Unspecified abdominal pain: Secondary | ICD-10-CM

## 2017-03-14 DIAGNOSIS — Z79899 Other long term (current) drug therapy: Secondary | ICD-10-CM | POA: Diagnosis not present

## 2017-03-14 DIAGNOSIS — R1032 Left lower quadrant pain: Secondary | ICD-10-CM | POA: Diagnosis not present

## 2017-03-14 DIAGNOSIS — E119 Type 2 diabetes mellitus without complications: Secondary | ICD-10-CM | POA: Diagnosis not present

## 2017-03-14 DIAGNOSIS — F17210 Nicotine dependence, cigarettes, uncomplicated: Secondary | ICD-10-CM | POA: Insufficient documentation

## 2017-03-14 DIAGNOSIS — I10 Essential (primary) hypertension: Secondary | ICD-10-CM | POA: Insufficient documentation

## 2017-03-14 DIAGNOSIS — Z7984 Long term (current) use of oral hypoglycemic drugs: Secondary | ICD-10-CM | POA: Diagnosis not present

## 2017-03-14 DIAGNOSIS — K5732 Diverticulitis of large intestine without perforation or abscess without bleeding: Secondary | ICD-10-CM | POA: Diagnosis not present

## 2017-03-14 DIAGNOSIS — K5792 Diverticulitis of intestine, part unspecified, without perforation or abscess without bleeding: Secondary | ICD-10-CM | POA: Diagnosis not present

## 2017-03-14 DIAGNOSIS — Z791 Long term (current) use of non-steroidal anti-inflammatories (NSAID): Secondary | ICD-10-CM | POA: Diagnosis not present

## 2017-03-14 LAB — COMPREHENSIVE METABOLIC PANEL
ALT: 17 U/L (ref 14–54)
ANION GAP: 12 (ref 5–15)
AST: 16 U/L (ref 15–41)
Albumin: 4.2 g/dL (ref 3.5–5.0)
Alkaline Phosphatase: 98 U/L (ref 38–126)
BILIRUBIN TOTAL: 0.8 mg/dL (ref 0.3–1.2)
BUN: 19 mg/dL (ref 6–20)
CALCIUM: 9.6 mg/dL (ref 8.9–10.3)
CO2: 25 mmol/L (ref 22–32)
Chloride: 101 mmol/L (ref 101–111)
Creatinine, Ser: 0.78 mg/dL (ref 0.44–1.00)
Glucose, Bld: 133 mg/dL — ABNORMAL HIGH (ref 65–99)
Potassium: 3.9 mmol/L (ref 3.5–5.1)
SODIUM: 138 mmol/L (ref 135–145)
TOTAL PROTEIN: 8 g/dL (ref 6.5–8.1)

## 2017-03-14 LAB — CBC WITH DIFFERENTIAL/PLATELET
BASOS PCT: 0 %
Basophils Absolute: 0 10*3/uL (ref 0.0–0.1)
EOS ABS: 0.2 10*3/uL (ref 0.0–0.7)
Eosinophils Relative: 1 %
HCT: 41.8 % (ref 36.0–46.0)
Hemoglobin: 14.2 g/dL (ref 12.0–15.0)
LYMPHS PCT: 18 %
Lymphs Abs: 2.9 10*3/uL (ref 0.7–4.0)
MCH: 29.1 pg (ref 26.0–34.0)
MCHC: 34 g/dL (ref 30.0–36.0)
MCV: 85.7 fL (ref 78.0–100.0)
Monocytes Absolute: 1 10*3/uL (ref 0.1–1.0)
Monocytes Relative: 6 %
NEUTROS ABS: 11.9 10*3/uL — AB (ref 1.7–7.7)
Neutrophils Relative %: 75 %
Platelets: 276 10*3/uL (ref 150–400)
RBC: 4.88 MIL/uL (ref 3.87–5.11)
RDW: 14.6 % (ref 11.5–15.5)
WBC: 16 10*3/uL — ABNORMAL HIGH (ref 4.0–10.5)

## 2017-03-14 LAB — LIPASE, BLOOD: LIPASE: 72 U/L — AB (ref 11–51)

## 2017-03-14 MED ORDER — SODIUM CHLORIDE 0.9 % IV SOLN: Freq: Once | INTRAVENOUS | Status: DC

## 2017-03-14 MED ORDER — AMOXICILLIN-POT CLAVULANATE 875-125 MG PO TABS
1.0000 | ORAL_TABLET | Freq: Once | ORAL | Status: AC
  Administered 2017-03-14: 1 via ORAL
  Filled 2017-03-14: qty 1

## 2017-03-14 MED ORDER — HYDROCODONE-ACETAMINOPHEN 5-325 MG PO TABS: 2.0000 | ORAL_TABLET | ORAL | 0 refills | Status: DC | PRN

## 2017-03-14 MED ORDER — PROMETHAZINE HCL 25 MG/ML IJ SOLN
12.5000 mg | Freq: Once | INTRAMUSCULAR | Status: AC
  Administered 2017-03-14: 12.5 mg via INTRAVENOUS
  Filled 2017-03-14: qty 1

## 2017-03-14 MED ORDER — AMOXICILLIN-POT CLAVULANATE 875-125 MG PO TABS: 1.0000 | ORAL_TABLET | Freq: Two times a day (BID) | ORAL | 0 refills | Status: DC

## 2017-03-14 MED ORDER — SODIUM CHLORIDE 0.9 % IV BOLUS (SEPSIS)
500.0000 mL | Freq: Once | INTRAVENOUS | Status: AC
  Administered 2017-03-14: 500 mL via INTRAVENOUS

## 2017-03-14 MED ORDER — HYDROCODONE-ACETAMINOPHEN 5-325 MG PO TABS
1.0000 | ORAL_TABLET | Freq: Once | ORAL | Status: AC
  Administered 2017-03-14: 1 via ORAL
  Filled 2017-03-14: qty 1

## 2017-03-14 MED ORDER — FENTANYL CITRATE (PF) 100 MCG/2ML IJ SOLN
50.0000 ug | INTRAMUSCULAR | Status: DC | PRN
  Administered 2017-03-14: 50 ug via INTRAVENOUS
  Filled 2017-03-14: qty 2

## 2017-03-14 MED ORDER — METRONIDAZOLE 500 MG PO TABS: 500.0000 mg | ORAL_TABLET | Freq: Three times a day (TID) | ORAL | 0 refills | Status: DC

## 2017-03-14 MED ORDER — METRONIDAZOLE 500 MG PO TABS
500.0000 mg | ORAL_TABLET | Freq: Once | ORAL | Status: AC
  Administered 2017-03-14: 500 mg via ORAL
  Filled 2017-03-14: qty 1

## 2017-03-14 MED ORDER — DOCUSATE SODIUM 100 MG PO CAPS: 100.0000 mg | ORAL_CAPSULE | Freq: Two times a day (BID) | ORAL | 0 refills | Status: DC

## 2017-03-14 MED FILL — HYDROCODON-APAP 5-325: 5-325 | 2 days supply | Qty: 10 | Fill #0

## 2017-03-14 MED FILL — AMOX-CLAV 875-125 MG TABLET: 875-125 | 7 days supply | Qty: 14 | Fill #0

## 2017-03-14 MED FILL — metroNIDAZOLE 500 MG TABS: 500 | 10 days supply | Qty: 30 | Fill #0

## 2017-03-14 MED FILL — DOK 100 MG SOFTGEL: 100 | 50 days supply | Qty: 100 | Fill #0

## 2017-03-14 NOTE — ED Notes (Signed)
Pt claimed that the pain started Saturday around 5:30 pm and it gets worst when she ambulates.  Sharp pain that starts from LUQ and LLQ.

## 2017-03-14 NOTE — ED Notes (Signed)
ED Provider at bedside. 

## 2017-03-14 NOTE — ED Provider Notes (Signed)
[facility] Provider Note   CSN: [id number] Arrival date & time: 03/14/17  1300     History   Chief Complaint Chief Complaint  Patient presents with  . Abdominal Pain    HPI [name] is a 51 y.o. female. Chief complaint is left lower quadrant abdominal pain.  HPI:  This is a 51 year old female. No previous episodes of diverticulitis. Has a history of small intestine malrotation. His never had volvulus or complications related to this. She describes lower left abdominal pain starting yesterday and worsening today. Nausea no vomiting. Did have a bowel movement today. No blood. No diarrhea or mucousy stools. Has never had colonoscopy. His uncertain if she has diverticuli.  No fever. No wrist or symptoms. No other complaints.  Past Medical History:  Diagnosis Date  . Anxiety   . Arthritis   . Diabetes mellitus   . Endometriosis   . Hypersomnia, persistent    epworth 16   . Hypertension   . Lyme borreliosis   . Migraine   . Morbid obesity ([place])   . Obesity, Class III, BMI 40-49.9 (morbid obesity) ([place])   . Obstructive sleep apnea    CPAP NIGHTLY; sleep study 2007.  Marland Kitchen Renal disorder    kidney stone    Patient Active Problem List   Diagnosis Date Noted  . Anxiety 03/08/2016  . Chest pain 02/20/2016  . Vitamin D insufficiency 09/25/2015  . Endometriosis 01/17/2014  . Sleep apnea with use of continuous positive airway pressure (CPAP) 06/27/2013  . Nocturia 06/27/2013  . Obesity, Class III, BMI 40-49.9 (morbid obesity) ([place])   . Obstructive sleep apnea 02/15/2013  . Hypersomnia, persistent   . HTN (hypertension) 02/03/2013  . DM (diabetes mellitus), type 2 ([place]) 02/03/2013    Past Surgical History:  Procedure Laterality Date  . ABLATION ON ENDOMETRIOSIS    . ABLATION ON ENDOMETRIOSIS     10 years ago  . CARDIAC CATHETERIZATION N/A 02/20/2016   Procedure: Left Heart Cath and Coronary Angiography;  Surgeon: [name], MD;  Location: [facility];  Service: Cardiovascular;  Laterality: N/A;  . CHOLECYSTECTOMY    . KNEE ARTHROCENTESIS    . KNEE SURGERY    . TUBAL LIGATION      OB History    No data available       Home Medications    Prior to Admission medications   Medication Sig Start Date End Date Taking? Authorizing Provider  amoxicillin-clavulanate (AUGMENTIN) 875-125 MG tablet Take 1 tablet by mouth 2 (two) times daily. 11/13/16   Gale Journey, Damaris Hippo, PA-C  celecoxib (CELEBREX) 200 MG capsule Take 1 capsule (200 mg total) by mouth daily. 06/01/16   Weber, Damaris Hippo, PA-C  chlorpheniramine-HYDROcodone (TUSSIONEX PENNKINETIC ER) 10-8 MG/5ML SUER Take 5 mLs by mouth 2 (two) times daily as needed for cough. 11/13/16   Weber, Damaris Hippo, PA-C  cyclobenzaprine (FLEXERIL) 5 MG tablet TAKE 1 TABLET (5 MG TOTAL) BY MOUTH 3 (THREE) TIMES DAILY AS NEEDED FOR MUSCLE SPASMS. 06/01/16   Gale Journey, Damaris Hippo, PA-C  diazepam (VALIUM) 2 MG tablet Take 0.5-1 tablets (1-2 mg total) by mouth every 12 (twelve) hours as needed for anxiety or muscle spasms. 02/08/17   Gale Journey, Damaris Hippo, PA-C  doxycycline (VIBRAMYCIN) 100 MG capsule Take 1 capsule (100 mg total) by mouth 2 (two) times daily. 02/28/17 03/21/17  Rodell Perna A, PA-C  FLUoxetine (PROZAC) 20 MG capsule Take 1 capsule (20 mg total) by mouth 3 (three) times daily. Patient  taking differently: Take 20 mg by mouth 2 (two) times daily.  09/08/16   Weber, Damaris Hippo, PA-C  furosemide (LASIX) 20 MG tablet Take 0.5-1 tablets (10-20 mg total) by mouth daily. 06/01/16   Weber, Damaris Hippo, PA-C  glucose blood test strip Use as instructed 06/01/16   Weber, Damaris Hippo, PA-C  HYDROcodone-acetaminophen (NORCO/VICODIN) 5-325 MG tablet Take 1-2 tablets by mouth every 6 (six) hours as needed. 02/08/17   Weber, Damaris Hippo, PA-C  ibuprofen (ADVIL,MOTRIN) 800 MG tablet Take 1 tablet (800 mg total) by mouth 3 (three) times daily. 02/08/17   Weber, Damaris Hippo, PA-C  liraglutide (VICTOZA) 18 MG/3ML SOPN INJECT 0.3MLS SUBCUTANEOUSLY DAILY 11/22/16    Gale Journey, Damaris Hippo, PA-C  Lorcaserin HCl 10 MG TABS Take 10 mg by mouth daily. 11/13/16   Weber, Damaris Hippo, PA-C  metoprolol (LOPRESSOR) 50 MG tablet TAKE 1 TABLET BY MOUTH TWICE A DAY 12/25/16   Weber, Sarah L, PA-C  NOVOTWIST 32G X 5 MM MISC USE 1 APPLICATION BY DOES NOT APPLY ROUTE DAILY. 06/01/16   Gale Journey, Damaris Hippo, PA-C  rosuvastatin (CRESTOR) 40 MG tablet Take 1 tablet (40 mg total) by mouth at bedtime. 09/08/16   Weber, Sarah L, PA-C  TOUJEO SOLOSTAR 300 UNIT/ML SOPN INJECT 80 UNITS INTO THE SKIN DAILY. 01/25/17   Weber, Damaris Hippo, PA-C  triamcinolone (NASACORT AQ) 55 MCG/ACT AERO nasal inhaler Place 2 sprays into the nose daily. 06/01/16   Gale Journey, Damaris Hippo, PA-C    Family History Family History  Problem Relation Age of Onset  . Diabetes Mother   . Thyroid disease Mother   . Depression Mother        with anxiety  . Stroke Mother   . Diabetes Father   . Hypertension Father   . Diabetes Sister   . Hypertension Sister   . Hypertension Brother     Social History Social History  Substance Use Topics  . Smoking status: Current Every Day Smoker    Packs/day: 0.20    Years: 0.00    Types: Cigarettes  . Smokeless tobacco: Never Used  . Alcohol use No     Allergies   Contrast media [iodinated diagnostic agents]; Darvocet [propoxyphene n-acetaminophen]; Lisinopril; Metformin and related; Morphine and related; Percocet [oxycodone-acetaminophen]; Wellbutrin [bupropion hcl]; and Zofran [ondansetron hcl]   Review of Systems Review of Systems  Constitutional: Negative for appetite change, chills, diaphoresis, fatigue and fever.  HENT: Negative for mouth sores, sore throat and trouble swallowing.   Eyes: Negative for visual disturbance.  Respiratory: Negative for cough, chest tightness, shortness of breath and wheezing.   Cardiovascular: Negative for chest pain.  Gastrointestinal: Positive for abdominal pain. Negative for abdominal distention, diarrhea, nausea and vomiting.  Endocrine: Negative for  polydipsia, polyphagia and polyuria.  Genitourinary: Negative for dysuria, frequency and hematuria.  Musculoskeletal: Negative for gait problem.  Skin: Negative for color change, pallor and rash.  Neurological: Negative for dizziness, syncope, light-headedness and headaches.  Hematological: Does not bruise/bleed easily.  Psychiatric/Behavioral: Negative for behavioral problems and confusion.     Physical Exam Updated Vital Signs BP 139/69   Pulse 96   Temp 98.9 F (37.2 C) (Oral)   Resp 20   Ht 5\' 2"  (1.575 m)   Wt 112.5 kg (248 lb)   SpO2 97%   BMI 45.36 kg/m   Physical Exam  Constitutional: She is oriented to person, place, and time. She appears well-developed and well-nourished. No distress.  HENT:  Head: Normocephalic.  Eyes: Conjunctivae are  normal. Pupils are equal, round, and reactive to light. No scleral icterus.  Neck: Normal range of motion. Neck supple. No thyromegaly present.  Cardiovascular: Normal rate and regular rhythm.  Exam reveals no gallop and no friction rub.   No murmur heard. Pulmonary/Chest: Effort normal and breath sounds normal. No respiratory distress. She has no wheezes. She has no rales.  Abdominal: Soft. Bowel sounds are normal. She exhibits no distension. There is no tenderness. There is no rebound.  Tenderness to left lower abdomen. Pain with cough but no additional signs of rebound or peritoneal irritation. Obese.  Musculoskeletal: Normal range of motion.  Neurological: She is alert and oriented to person, place, and time.  Skin: Skin is warm and dry. No rash noted.  Psychiatric: She has a normal mood and affect. Her behavior is normal.     ED Treatments / Results  Labs (all labs ordered are listed, but only abnormal results are displayed) Labs Reviewed  CBC WITH DIFFERENTIAL/PLATELET - Abnormal; Notable for the following:       Result Value   WBC 16.0 (*)    All other components within normal limits  COMPREHENSIVE METABOLIC PANEL -  Abnormal; Notable for the following:    Glucose, Bld 133 (*)    All other components within normal limits  LIPASE, BLOOD - Abnormal; Notable for the following:    Lipase 72 (*)    All other components within normal limits  URINALYSIS, ROUTINE W REFLEX MICROSCOPIC    EKG  EKG Interpretation None       Radiology Ct Abdomen Pelvis Wo Contrast  Result Date: 03/14/2017 CLINICAL DATA:  Abdominal pain and nausea EXAM: CT ABDOMEN AND PELVIS WITHOUT CONTRAST TECHNIQUE: Multidetector CT imaging of the abdomen and pelvis was performed following the standard protocol without oral or intravenous contrast material administration. COMPARISON:  May 16, 2017 FINDINGS: Lower chest: Lung bases are clear. Hepatobiliary: No focal liver lesions are apparent. Gallbladder is absent. There is no biliary duct dilatation. Pancreas: There is no pancreatic mass or inflammatory focus. Spleen:  No splenic lesions are evident. Adrenals/Urinary Tract: Adrenals bilaterally appear unremarkable. There is no apparent renal mass or hydronephrosis on either side. There is a 1 mm calculus in the upper pole right kidney. There is a 1 mm calculus in mid left kidney. There is no ureteral calculus on either side. Urinary bladder is midline with wall thickness within normal limits. Stomach/Bowel: There is wall and mesenteric thickening in the proximal sigmoid colon consistent with diverticulitis. There is no abscess or perforation evident. There is no bowel wall or mesenteric thickening elsewhere. There is no evident bowel obstruction. No free air or portal venous air. Vascular/Lymphatic: There is atherosclerotic calcification in the aorta and iliac arteries without aneurysm. Major mesenteric vessels appear patent on this noncontrast enhanced study. There is no adenopathy appreciable in the abdomen or pelvis. Reproductive: Uterus is anteverted.  No pelvic mass. Other: There is no periappendiceal region inflammation. There is no ascites or  abscess in the abdomen or pelvis. There is a small ventral hernia containing only fat. Musculoskeletal: There is degenerative change in the thoracic and lumbar spine regions. There are no blastic or lytic bone lesions. There is no intramuscular or abdominal wall lesion. IMPRESSION: Sigmoid diverticulitis without abscess or perforation. No bowel obstruction. No abscess elsewhere in the abdomen or pelvis. There is a small ventral hernia containing only fat. Gallbladder absent. Tiny intrarenal calculi bilaterally. No hydronephrosis. No ureteral calculus on either side. Electronically Signed  By: Lowella Grip III M.D.   On: 03/14/2017 13:59    Procedures Procedures (including critical care time)  Medications Ordered in ED Medications  fentaNYL (SUBLIMAZE) injection 50 mcg (50 mcg Intravenous Given 03/14/17 1423)  0.9 %  sodium chloride infusion (not administered)  promethazine (PHENERGAN) injection 12.5 mg (12.5 mg Intravenous Given 03/14/17 1423)  sodium chloride 0.9 % bolus 500 mL (500 mLs Intravenous New Bag/Given 03/14/17 1408)  amoxicillin-clavulanate (AUGMENTIN) 875-125 MG per tablet 1 tablet (1 tablet Oral Given 03/14/17 1435)  metroNIDAZOLE (FLAGYL) tablet 500 mg (500 mg Oral Given 03/14/17 1435)     Initial Impression / Assessment and Plan / ED Course  I have reviewed the triage vital signs and the nursing notes.  Pertinent labs & imaging results that were available during my care of the patient were reviewed by me and considered in my medical decision making (see chart for details).    IVs placed. Given IV senna for pain. Symptoms have improved. Leukocytosis of 16,000. Glucose 133. Normal renal function. Normal hepatobiliary enzymes.  CT shows no sign of obstructive pattern. Shows left lower quadrant sigmoid diverticulitis without abscess or perforation. I discussed this at length with patient. With consideration of her allergies and the patient's statement that she is uncertain about  Cipro she is given Augmentin and Flagyl. Plan will be home treatment. Vicodin, Colace, Augmentin 875, Flagyl. Low residue diet until resolved. Primary care follow-up. ER with any changes or worsening.  Final Clinical Impressions(s) / ED Diagnoses   Final diagnoses:  Abdominal pain    New Prescriptions New Prescriptions   No medications on file     Tanna Furry, MD 03/14/17 1456

## 2017-03-14 NOTE — ED Triage Notes (Signed)
Left lower abdominal pain since last night. She had a BM with no improvement.

## 2017-04-20 ENCOUNTER — Telehealth: Payer: Self-pay | Admitting: Physician Assistant

## 2017-04-20 DIAGNOSIS — N809 Endometriosis, unspecified: Secondary | ICD-10-CM

## 2017-04-20 NOTE — Telephone Encounter (Signed)
Pt is needing to get a refill on valium and vicodan and she knows that she needs an appt but cant get in till after Judson Roch comes back from vacation because she has had her sister pass on 04/06/17 and her mother pass on 04/16/17 and needs to get the funeral completed and then she will come in   [place] number [phone]

## 2017-04-20 NOTE — Telephone Encounter (Signed)
Created in error

## 2017-04-21 NOTE — Telephone Encounter (Signed)
Please advise 

## 2017-04-24 ENCOUNTER — Other Ambulatory Visit: Payer: Self-pay | Admitting: Physician Assistant

## 2017-04-24 DIAGNOSIS — I10 Essential (primary) hypertension: Secondary | ICD-10-CM

## 2017-04-25 MED ORDER — DIAZEPAM 2 MG PO TABS: 1.0000 mg | ORAL_TABLET | Freq: Two times a day (BID) | ORAL | 0 refills | Status: DC | PRN

## 2017-04-25 MED ORDER — HYDROCODONE-ACETAMINOPHEN 5-325 MG PO TABS: 2.0000 | ORAL_TABLET | ORAL | 0 refills | Status: DC | PRN

## 2017-04-25 NOTE — Telephone Encounter (Signed)
Informed patient prescription are ready for pick up

## 2017-04-25 NOTE — Telephone Encounter (Signed)
Done

## 2017-05-21 ENCOUNTER — Other Ambulatory Visit: Payer: Self-pay | Admitting: Physician Assistant

## 2017-05-21 DIAGNOSIS — E119 Type 2 diabetes mellitus without complications: Secondary | ICD-10-CM

## 2017-05-25 ENCOUNTER — Other Ambulatory Visit: Payer: Self-pay | Admitting: Physician Assistant

## 2017-05-25 ENCOUNTER — Telehealth: Payer: Self-pay | Admitting: Physician Assistant

## 2017-05-25 DIAGNOSIS — E119 Type 2 diabetes mellitus without complications: Secondary | ICD-10-CM

## 2017-05-25 NOTE — Telephone Encounter (Signed)
Pt states that she knows that she needs an appt for her blood sugar meds but with her husband being sick and having to go back to and forth more often with him to the cancer doctor she is unable to get an appt and would like to know if something can be called in   [facility] number [phone]

## 2017-05-26 MED ORDER — LIRAGLUTIDE 18 MG/3ML ~~LOC~~ SOPN: PEN_INJECTOR | SUBCUTANEOUS | 0 refills | Status: DC

## 2017-05-26 MED ORDER — INSULIN GLARGINE 300 UNIT/ML ~~LOC~~ SOPN: 80.0000 [IU] | PEN_INJECTOR | Freq: Every day | SUBCUTANEOUS | 0 refills | Status: DC

## 2017-05-26 NOTE — Telephone Encounter (Signed)
Spoke with patient, states that she is unable to get into the office for a visit for a few weeks, but that she is out of her Tujeo and Victoza. Patient is requesting 30 day supply. Per verbal order from provider, medications sent to [facility] in [place] per patient request. Patient states that she will come in for appointment prior to running out of medications./ S.Cherita Hebel,CMA

## 2017-05-27 ENCOUNTER — Emergency Department (HOSPITAL_BASED_OUTPATIENT_CLINIC_OR_DEPARTMENT_OTHER): Payer: BLUE CROSS/BLUE SHIELD

## 2017-05-27 ENCOUNTER — Emergency Department (HOSPITAL_BASED_OUTPATIENT_CLINIC_OR_DEPARTMENT_OTHER)
Admission: EM | Admit: 2017-05-27 | Discharge: 2017-05-27 | Disposition: A | Discharge status: Home / Self Care | Payer: BLUE CROSS/BLUE SHIELD | Attending: Emergency Medicine | Admitting: Emergency Medicine

## 2017-05-27 ENCOUNTER — Encounter (HOSPITAL_BASED_OUTPATIENT_CLINIC_OR_DEPARTMENT_OTHER): Payer: Self-pay | Admitting: *Deleted

## 2017-05-27 DIAGNOSIS — E119 Type 2 diabetes mellitus without complications: Secondary | ICD-10-CM | POA: Insufficient documentation

## 2017-05-27 DIAGNOSIS — Z79899 Other long term (current) drug therapy: Secondary | ICD-10-CM | POA: Insufficient documentation

## 2017-05-27 DIAGNOSIS — K5730 Diverticulosis of large intestine without perforation or abscess without bleeding: Secondary | ICD-10-CM | POA: Diagnosis not present

## 2017-05-27 DIAGNOSIS — R109 Unspecified abdominal pain: Secondary | ICD-10-CM | POA: Diagnosis not present

## 2017-05-27 DIAGNOSIS — R1032 Left lower quadrant pain: Secondary | ICD-10-CM | POA: Insufficient documentation

## 2017-05-27 DIAGNOSIS — I10 Essential (primary) hypertension: Secondary | ICD-10-CM | POA: Insufficient documentation

## 2017-05-27 DIAGNOSIS — F17210 Nicotine dependence, cigarettes, uncomplicated: Secondary | ICD-10-CM | POA: Insufficient documentation

## 2017-05-27 DIAGNOSIS — Z7984 Long term (current) use of oral hypoglycemic drugs: Secondary | ICD-10-CM | POA: Diagnosis not present

## 2017-05-27 LAB — CBC WITH DIFFERENTIAL/PLATELET
BASOS PCT: 1 %
Basophils Absolute: 0.1 10*3/uL (ref 0.0–0.1)
EOS PCT: 3 %
Eosinophils Absolute: 0.3 10*3/uL (ref 0.0–0.7)
HEMATOCRIT: 35.7 % — AB (ref 36.0–46.0)
Hemoglobin: 12.1 g/dL (ref 12.0–15.0)
Lymphocytes Relative: 34 %
Lymphs Abs: 3.4 10*3/uL (ref 0.7–4.0)
MCH: 29.6 pg (ref 26.0–34.0)
MCHC: 33.9 g/dL (ref 30.0–36.0)
MCV: 87.3 fL (ref 78.0–100.0)
MONO ABS: 0.7 10*3/uL (ref 0.1–1.0)
MONOS PCT: 7 %
NEUTROS ABS: 5.5 10*3/uL (ref 1.7–7.7)
Neutrophils Relative %: 55 %
PLATELETS: 295 10*3/uL (ref 150–400)
RBC: 4.09 MIL/uL (ref 3.87–5.11)
RDW: 15.6 % — AB (ref 11.5–15.5)
WBC: 9.9 10*3/uL (ref 4.0–10.5)

## 2017-05-27 LAB — URINALYSIS, ROUTINE W REFLEX MICROSCOPIC
BILIRUBIN URINE: NEGATIVE
Glucose, UA: NEGATIVE mg/dL
HGB URINE DIPSTICK: NEGATIVE
Ketones, ur: NEGATIVE mg/dL
Leukocytes, UA: NEGATIVE
Nitrite: NEGATIVE
Protein, ur: NEGATIVE mg/dL
SPECIFIC GRAVITY, URINE: 1.025 (ref 1.005–1.030)
pH: 6 (ref 5.0–8.0)

## 2017-05-27 LAB — COMPREHENSIVE METABOLIC PANEL
ALBUMIN: 4 g/dL (ref 3.5–5.0)
ALT: 22 U/L (ref 14–54)
ANION GAP: 9 (ref 5–15)
AST: 19 U/L (ref 15–41)
Alkaline Phosphatase: 90 U/L (ref 38–126)
BILIRUBIN TOTAL: 0.3 mg/dL (ref 0.3–1.2)
BUN: 20 mg/dL (ref 6–20)
CO2: 25 mmol/L (ref 22–32)
Calcium: 9.4 mg/dL (ref 8.9–10.3)
Chloride: 105 mmol/L (ref 101–111)
Creatinine, Ser: 0.83 mg/dL (ref 0.44–1.00)
GFR calc Af Amer: >60 mL/min (ref >60)
Glucose, Bld: 116 mg/dL — ABNORMAL HIGH (ref 65–99)
POTASSIUM: 3.5 mmol/L (ref 3.5–5.1)
Sodium: 139 mmol/L (ref 135–145)
TOTAL PROTEIN: 6.7 g/dL (ref 6.5–8.1)

## 2017-05-27 LAB — LIPASE, BLOOD: LIPASE: 63 U/L — AB (ref 11–51)

## 2017-05-27 MED ORDER — HYDROCODONE-ACETAMINOPHEN 5-325 MG PO TABS: 1.0000 | ORAL_TABLET | Freq: Once | ORAL | Status: DC

## 2017-05-27 MED ORDER — DIPHENOXYLATE-ATROPINE 2.5-0.025 MG PO TABS
2.0000 | ORAL_TABLET | Freq: Once | ORAL | Status: AC
  Administered 2017-05-27: 2 via ORAL
  Filled 2017-05-27: qty 2

## 2017-05-27 MED ORDER — DICYCLOMINE HCL 20 MG PO TABS: 20.0000 mg | ORAL_TABLET | Freq: Two times a day (BID) | ORAL | 0 refills | Status: DC

## 2017-05-27 MED ORDER — DIPHENHYDRAMINE HCL 25 MG PO CAPS
25.0000 mg | ORAL_CAPSULE | Freq: Once | ORAL | Status: AC
  Administered 2017-05-27: 25 mg via ORAL
  Filled 2017-05-27: qty 1

## 2017-05-27 MED ORDER — METOCLOPRAMIDE HCL 5 MG/ML IJ SOLN
10.0000 mg | Freq: Once | INTRAMUSCULAR | Status: AC
  Administered 2017-05-27: 10 mg via INTRAVENOUS
  Filled 2017-05-27: qty 2

## 2017-05-27 MED ORDER — HYDROMORPHONE HCL 1 MG/ML IJ SOLN
1.0000 mg | INTRAMUSCULAR | Status: DC | PRN
  Administered 2017-05-27: 1 mg via INTRAVENOUS
  Filled 2017-05-27: qty 1

## 2017-05-27 MED ORDER — TETANUS-DIPHTH-ACELL PERTUSSIS 5-2.5-18.5 LF-MCG/0.5 IM SUSP: 0.5000 mL | Freq: Once | INTRAMUSCULAR | Status: DC

## 2017-05-27 MED ORDER — DIPHENOXYLATE-ATROPINE 2.5-0.025 MG PO TABS: 1.0000 | ORAL_TABLET | Freq: Four times a day (QID) | ORAL | 0 refills | Status: DC | PRN

## 2017-05-27 NOTE — ED Notes (Signed)
ED Provider at bedside. 

## 2017-05-27 NOTE — ED Provider Notes (Signed)
[facility] DE[facility] Provider Note   CSN: [id number] Arrival date & time: 05/27/17  1719     History   Chief Complaint Chief Complaint  Patient presents with  . Abdominal Pain    HPI [name] is a 51 y.o. female. Chief complaint is abdominal pain.  HPI:  51 year old female. Diarrhea for the last week or 2. Has some localized left lower quadrant pain. Has history of diverticulitis became no blood in her stools. No fever. States she just found out yesterday that her husband has "terminal cancer". That she has had a lot of social and family stress in her life secondary to this.  Past Medical History:  Diagnosis Date  . Anxiety   . Arthritis   . Diabetes mellitus   . Endometriosis   . Hypersomnia, persistent    epworth 16   . Hypertension   . Lyme borreliosis   . Migraine   . Morbid obesity ([place])   . Obesity, Class III, BMI 40-49.9 (morbid obesity) ([place])   . Obstructive sleep apnea    CPAP NIGHTLY; sleep study 2007.  Marland Kitchen Renal disorder    kidney stone    Patient Active Problem List   Diagnosis Date Noted  . Anxiety 03/08/2016  . Chest pain 02/20/2016  . Vitamin D insufficiency 09/25/2015  . Endometriosis 01/17/2014  . Sleep apnea with use of continuous positive airway pressure (CPAP) 06/27/2013  . Nocturia 06/27/2013  . Obesity, Class III, BMI 40-49.9 (morbid obesity) ([place])   . Obstructive sleep apnea 02/15/2013  . Hypersomnia, persistent   . HTN (hypertension) 02/03/2013  . DM (diabetes mellitus), type 2 ([place]) 02/03/2013    Past Surgical History:  Procedure Laterality Date  . ABLATION ON ENDOMETRIOSIS    . ABLATION ON ENDOMETRIOSIS     10 years ago  . CARDIAC CATHETERIZATION N/A 02/20/2016   Procedure: Left Heart Cath and Coronary Angiography;  Surgeon: [name], MD;  Location: [facility];  Service: Cardiovascular;  Laterality: N/A;  . CHOLECYSTECTOMY    . KNEE ARTHROCENTESIS    . KNEE SURGERY    . TUBAL LIGATION      OB History    No data available       Home Medications    Prior to Admission medications   Medication Sig Start Date End Date Taking? Authorizing Provider  celecoxib (CELEBREX) 200 MG capsule Take 1 capsule (200 mg total) by mouth daily. 06/01/16   Weber, Damaris Hippo, PA-C  cyclobenzaprine (FLEXERIL) 5 MG tablet TAKE 1 TABLET (5 MG TOTAL) BY MOUTH 3 (THREE) TIMES DAILY AS NEEDED FOR MUSCLE SPASMS. 06/01/16   Gale Journey, Damaris Hippo, PA-C  diazepam (VALIUM) 2 MG tablet Take 0.5-1 tablets (1-2 mg total) by mouth every 12 (twelve) hours as needed for anxiety or muscle spasms. 04/25/17   Weber, Damaris Hippo, PA-C  dicyclomine (BENTYL) 20 MG tablet Take 1 tablet (20 mg total) by mouth 2 (two) times daily. 05/27/17   Tanna Furry, MD  diphenoxylate-atropine (LOMOTIL) 2.5-0.025 MG tablet Take 1 tablet by mouth 4 (four) times daily as needed for diarrhea or loose stools. 05/27/17   Tanna Furry, MD  docusate sodium (COLACE) 100 MG capsule Take 1 capsule (100 mg total) by mouth every 12 (twelve) hours. 03/14/17   Tanna Furry, MD  FLUoxetine (PROZAC) 20 MG capsule Take 1 capsule (20 mg total) by mouth 3 (three) times daily. Patient taking differently: Take 20 mg by mouth 2 (two) times daily.  09/08/16   Gale Journey, Damaris Hippo,  PA-C  furosemide (LASIX) 20 MG tablet Take 0.5-1 tablets (10-20 mg total) by mouth daily. 06/01/16   Weber, Damaris Hippo, PA-C  glucose blood test strip Use as instructed 06/01/16   Weber, Damaris Hippo, PA-C  HYDROcodone-acetaminophen (NORCO/VICODIN) 5-325 MG tablet Take 2 tablets by mouth every 4 (four) hours as needed. 04/25/17   Weber, Damaris Hippo, PA-C  ibuprofen (ADVIL,MOTRIN) 800 MG tablet Take 1 tablet (800 mg total) by mouth 3 (three) times daily. 02/08/17   Weber, Damaris Hippo, PA-C  Insulin Glargine (TOUJEO SOLOSTAR) 300 UNIT/ML SOPN Inject 80 Units into the skin daily. 05/26/17   Weber, Damaris Hippo, PA-C  liraglutide (VICTOZA) 18 MG/3ML SOPN INJECT 0.3MLS SUBCUTANEOUSLY DAILY 05/26/17   Weber, Damaris Hippo, PA-C  Lorcaserin HCl 10 MG TABS Take 10 mg by  mouth daily. 11/13/16   Gale Journey, Damaris Hippo, PA-C  metoprolol tartrate (LOPRESSOR) 50 MG tablet Take 1 tablet (50 mg total) by mouth 2 (two) times daily. Patient needs office visit for 90 day supply 04/25/17   Weber, Sarah L, PA-C  NOVOTWIST 32G X 5 MM MISC USE 1 APPLICATION BY DOES NOT APPLY ROUTE DAILY. 06/01/16   Gale Journey, Damaris Hippo, PA-C  rosuvastatin (CRESTOR) 40 MG tablet Take 1 tablet (40 mg total) by mouth at bedtime. 09/08/16   Weber, Damaris Hippo, PA-C  triamcinolone (NASACORT AQ) 55 MCG/ACT AERO nasal inhaler Place 2 sprays into the nose daily. 06/01/16   Gale Journey, Damaris Hippo, PA-C    Family History Family History  Problem Relation Age of Onset  . Diabetes Mother   . Thyroid disease Mother   . Depression Mother        with anxiety  . Stroke Mother   . Diabetes Father   . Hypertension Father   . Diabetes Sister   . Hypertension Sister   . Hypertension Brother     Social History Social History  Substance Use Topics  . Smoking status: Current Every Day Smoker    Packs/day: 0.20    Years: 0.00    Types: Cigarettes  . Smokeless tobacco: Never Used  . Alcohol use No     Allergies   Contrast media [iodinated diagnostic agents]; Darvocet [propoxyphene n-acetaminophen]; Lisinopril; Metformin and related; Morphine and related; Percocet [oxycodone-acetaminophen]; Wellbutrin [bupropion hcl]; and Zofran [ondansetron hcl]   Review of Systems Review of Systems  Constitutional: Negative for appetite change, chills, diaphoresis, fatigue and fever.  HENT: Negative for mouth sores, sore throat and trouble swallowing.   Eyes: Negative for visual disturbance.  Respiratory: Negative for cough, chest tightness, shortness of breath and wheezing.   Cardiovascular: Negative for chest pain.  Gastrointestinal: Positive for abdominal pain and diarrhea. Negative for abdominal distention, nausea and vomiting.  Endocrine: Negative for polydipsia, polyphagia and polyuria.  Genitourinary: Negative for dysuria,  frequency and hematuria.  Musculoskeletal: Negative for gait problem.  Skin: Negative for color change, pallor and rash.  Neurological: Negative for dizziness, syncope, light-headedness and headaches.  Hematological: Does not bruise/bleed easily.  Psychiatric/Behavioral: Negative for behavioral problems and confusion.     Physical Exam Updated Vital Signs BP (!) 108/54   Pulse 61   Temp 99.3 F (37.4 C) (Oral)   Resp 11   Ht 5\' 1"  (1.549 m)   Wt 112 kg (247 lb)   LMP 05/20/2017 (Within Days) Comment: lmp 1 week ago  SpO2 96%   BMI 46.67 kg/m   Physical Exam  Constitutional: She is oriented to person, place, and time. She appears well-developed and well-nourished. No distress.  HENT:  Head: Normocephalic.  Eyes: Pupils are equal, round, and reactive to light. Conjunctivae are normal. No scleral icterus.  Neck: Normal range of motion. Neck supple. No thyromegaly present.  Cardiovascular: Normal rate and regular rhythm.  Exam reveals no gallop and no friction rub.   No murmur heard. Pulmonary/Chest: Effort normal and breath sounds normal. No respiratory distress. She has no wheezes. She has no rales.  Abdominal: Soft. Bowel sounds are normal. She exhibits no distension. There is no tenderness. There is no rebound.  Soft. Some tenderness in left lower quadrant. No frank peritoneal irritation. She does have pain with ambulation. Not with cough.  Musculoskeletal: Normal range of motion.  Neurological: She is alert and oriented to person, place, and time.  Skin: Skin is warm and dry. No rash noted.  Psychiatric: She has a normal mood and affect. Her behavior is normal.     ED Treatments / Results  Labs (all labs ordered are listed, but only abnormal results are displayed) Labs Reviewed  CBC WITH DIFFERENTIAL/PLATELET - Abnormal; Notable for the following:       Result Value   HCT 35.7 (*)    RDW 15.6 (*)    All other components within normal limits  COMPREHENSIVE METABOLIC  PANEL - Abnormal; Notable for the following:    Glucose, Bld 116 (*)    All other components within normal limits  LIPASE, BLOOD - Abnormal; Notable for the following:    Lipase 63 (*)    All other components within normal limits  URINALYSIS, ROUTINE W REFLEX MICROSCOPIC    EKG  EKG Interpretation None       Radiology Ct Abdomen Pelvis Wo Contrast  Result Date: 05/27/2017 CLINICAL DATA:  51 year old female with acute lower abdominal and pelvic pain and nausea. EXAM: CT ABDOMEN AND PELVIS WITHOUT CONTRAST TECHNIQUE: Multidetector CT imaging of the abdomen and pelvis was performed following the standard protocol without IV contrast. COMPARISON:  03/14/2017 and prior CTs FINDINGS: Please note that parenchymal abnormalities may be missed without intravenous contrast. Lower chest: No acute abnormality. Hepatobiliary: The liver is unremarkable. The patient is status post cholecystectomy. No biliary dilatation. Pancreas: Unremarkable Spleen: Unremarkable Adrenals/Urinary Tract: The kidneys, adrenal glands and bladder are unremarkable except for a a nonobstructing 3 mm right upper pole renal calculus. Stomach/Bowel: Stomach is within normal limits. No evidence of bowel wall thickening, distention, or inflammatory changes. Colonic diverticulosis noted without evidence of diverticulitis. Vascular/Lymphatic: Aortic atherosclerosis. No enlarged abdominal or pelvic lymph nodes. Reproductive: Uterus and bilateral adnexa are unremarkable. Other: No ascites, focal collection or pneumoperitoneum. A small umbilical hernia containing fat is unchanged. Musculoskeletal: No acute bony abnormality or suspicious focal lesion IMPRESSION: 1. No evidence of acute abnormality 2. Colonic diverticulosis without evidence of diverticulitis 3. 3 mm nonobstructing right renal calculus 4. Small umbilical hernia containing fat 5.  Aortic Atherosclerosis (ICD10-I70.0). Electronically Signed   By: Margarette Canada M.D.   On: 05/27/2017  18:31    Procedures Procedures (including critical care time)  Medications Ordered in ED Medications  HYDROmorphone (DILAUDID) injection 1 mg (1 mg Intravenous Given 05/27/17 1822)  diphenoxylate-atropine (LOMOTIL) 2.5-0.025 MG per tablet 2 tablet (not administered)  diphenhydrAMINE (BENADRYL) capsule 25 mg (not administered)  metoCLOPramide (REGLAN) injection 10 mg (10 mg Intravenous Given 05/27/17 1818)     Initial Impression / Assessment and Plan / ED Course  I have reviewed the triage vital signs and the nursing notes.  Pertinent labs & imaging results that were available during my  care of the patient were reviewed by me and considered in my medical decision making (see chart for details).    Reassuring labs. CT shows diverticuli. No diverticulitis. Given IV pain medications and have improved. He Lomotil and Bentyl. Plan by mouth Lomotil and Bentyl at home when necessary for diarrhea and intestinal colic.  Final Clinical Impressions(s) / ED Diagnoses   Final diagnoses:  Left lower quadrant pain    New Prescriptions New Prescriptions   DICYCLOMINE (BENTYL) 20 MG TABLET    Take 1 tablet (20 mg total) by mouth 2 (two) times daily.   DIPHENOXYLATE-ATROPINE (LOMOTIL) 2.5-0.025 MG TABLET    Take 1 tablet by mouth 4 (four) times daily as needed for diarrhea or loose stools.     Tanna Furry, MD 05/27/17 [phone]

## 2017-05-27 NOTE — ED Triage Notes (Signed)
Pt co left lower abd pain x 1 day 

## 2017-05-27 NOTE — Discharge Instructions (Signed)
Lomotil for diarrhea.  Bentyl for cramps.  Primary care if not improving.

## 2017-05-27 NOTE — ED Notes (Signed)
Pt educated about not driving or performing other critical tasks (such as operating heavy machinery, caring for infant/toddler/child) due to sedative nature of medications received in ED. Also warned about risks of consuming alcohol or taking other medications with sedative properties. Pt/caregiver verbalized understanding.

## 2017-05-27 NOTE — ED Notes (Signed)
Pt unable to void at this time. 

## 2017-05-27 NOTE — ED Notes (Signed)
Patient transported to CT 

## 2017-06-22 ENCOUNTER — Other Ambulatory Visit: Payer: Self-pay | Admitting: Physician Assistant

## 2017-06-22 DIAGNOSIS — E119 Type 2 diabetes mellitus without complications: Secondary | ICD-10-CM

## 2017-06-22 NOTE — Telephone Encounter (Signed)
Please advise 

## 2017-06-24 NOTE — Telephone Encounter (Signed)
Pt has appt tomorrow

## 2017-06-25 ENCOUNTER — Ambulatory Visit: Payer: BLUE CROSS/BLUE SHIELD | Admitting: Physician Assistant

## 2017-06-28 ENCOUNTER — Ambulatory Visit: Payer: BLUE CROSS/BLUE SHIELD | Admitting: Physician Assistant

## 2017-07-01 ENCOUNTER — Ambulatory Visit (INDEPENDENT_AMBULATORY_CARE_PROVIDER_SITE_OTHER): Payer: BLUE CROSS/BLUE SHIELD | Admitting: Physician Assistant

## 2017-07-01 ENCOUNTER — Encounter: Payer: Self-pay | Admitting: Physician Assistant

## 2017-07-01 VITALS — BP 154/80 | HR 89 | Temp 98.9°F | Resp 18 | Ht 61.0 in | Wt 250.2 lb

## 2017-07-01 DIAGNOSIS — S39012A Strain of muscle, fascia and tendon of lower back, initial encounter: Secondary | ICD-10-CM | POA: Diagnosis not present

## 2017-07-01 DIAGNOSIS — R600 Localized edema: Secondary | ICD-10-CM | POA: Diagnosis not present

## 2017-07-01 DIAGNOSIS — K5730 Diverticulosis of large intestine without perforation or abscess without bleeding: Secondary | ICD-10-CM | POA: Insufficient documentation

## 2017-07-01 DIAGNOSIS — E119 Type 2 diabetes mellitus without complications: Secondary | ICD-10-CM | POA: Diagnosis not present

## 2017-07-01 DIAGNOSIS — F430 Acute stress reaction: Secondary | ICD-10-CM

## 2017-07-01 DIAGNOSIS — Z1211 Encounter for screening for malignant neoplasm of colon: Secondary | ICD-10-CM | POA: Diagnosis not present

## 2017-07-01 DIAGNOSIS — E7800 Pure hypercholesterolemia, unspecified: Secondary | ICD-10-CM

## 2017-07-01 DIAGNOSIS — I10 Essential (primary) hypertension: Secondary | ICD-10-CM | POA: Diagnosis not present

## 2017-07-01 DIAGNOSIS — F321 Major depressive disorder, single episode, moderate: Secondary | ICD-10-CM | POA: Diagnosis not present

## 2017-07-01 DIAGNOSIS — N809 Endometriosis, unspecified: Secondary | ICD-10-CM

## 2017-07-01 MED ORDER — FLUOXETINE HCL 20 MG PO CAPS: 20.0000 mg | ORAL_CAPSULE | Freq: Two times a day (BID) | ORAL | 1 refills | Status: DC

## 2017-07-01 MED ORDER — METOPROLOL TARTRATE 50 MG PO TABS: 50.0000 mg | ORAL_TABLET | Freq: Two times a day (BID) | ORAL | 0 refills | Status: DC

## 2017-07-01 MED ORDER — HYDROCODONE-ACETAMINOPHEN 5-325 MG PO TABS: 2.0000 | ORAL_TABLET | ORAL | 0 refills | Status: DC | PRN

## 2017-07-01 MED ORDER — LIRAGLUTIDE 18 MG/3ML ~~LOC~~ SOPN: PEN_INJECTOR | SUBCUTANEOUS | 0 refills | Status: DC

## 2017-07-01 MED ORDER — FUROSEMIDE 20 MG PO TABS: 10.0000 mg | ORAL_TABLET | Freq: Every day | ORAL | 0 refills | Status: DC

## 2017-07-01 MED ORDER — DIAZEPAM 2 MG PO TABS: 1.0000 mg | ORAL_TABLET | Freq: Two times a day (BID) | ORAL | 0 refills | Status: DC | PRN

## 2017-07-01 MED ORDER — CELECOXIB 200 MG PO CAPS: 200.0000 mg | ORAL_CAPSULE | Freq: Every day | ORAL | 1 refills | Status: DC

## 2017-07-01 MED ORDER — INSULIN GLARGINE 300 UNIT/ML ~~LOC~~ SOPN: 80.0000 [IU] | PEN_INJECTOR | Freq: Every day | SUBCUTANEOUS | 0 refills | Status: DC

## 2017-07-01 MED ORDER — ROSUVASTATIN CALCIUM 40 MG PO TABS: 40.0000 mg | ORAL_TABLET | Freq: Every day | ORAL | 1 refills | Status: DC

## 2017-07-01 NOTE — Progress Notes (Signed)
[name]  MRN: [id number] DOB: [birth year]  PCP: Mancel Bale, PA-C  Chief Complaint  Patient presents with  . Medication Refill    all meds     Subjective:  Pt presents to clinic for medication refill.  She has not been doing great with her diet - her sister died over the summer ans eh had to clean out her house.  She takes her medication daily and checks her glucose at and the average is around 200.    Valium - 1/2 in the am , 1/2 in the afternoon, 1/2 in the evening and then 1/2 before before - she is really stressed out with husband's illness ans she is trying to enjoy all the time she has with him.  History is obtained by patient.  Review of Systems  Constitutional: Negative for chills and fever.  Respiratory: Negative for shortness of breath.   Cardiovascular: Negative for chest pain, palpitations and leg swelling.  Skin: Negative for wound.  Neurological: Negative for numbness.    Patient Active Problem List   Diagnosis Date Noted  . Colon, diverticulosis 07/01/2017  . Anxiety 03/08/2016  . Chest pain 02/20/2016  . Vitamin D insufficiency 09/25/2015  . Endometriosis 01/17/2014  . Sleep apnea with use of continuous positive airway pressure (CPAP) 06/27/2013  . Nocturia 06/27/2013  . Obesity, Class III, BMI 40-49.9 (morbid obesity) ([place])   . Obstructive sleep apnea 02/15/2013  . Hypersomnia, persistent   . HTN (hypertension) 02/03/2013  . DM (diabetes mellitus), type 2 ([place]) 02/03/2013    Current Outpatient Prescriptions on File Prior to Visit  Medication Sig Dispense Refill  . cyclobenzaprine (FLEXERIL) 5 MG tablet TAKE 1 TABLET (5 MG TOTAL) BY MOUTH 3 (THREE) TIMES DAILY AS NEEDED FOR MUSCLE SPASMS. 90 tablet 1  . glucose blood test strip Use as instructed 100 each 12  . NOVOTWIST 32G X 5 MM MISC USE 1 APPLICATION BY DOES NOT APPLY ROUTE DAILY. 200 each 3  . dicyclomine (BENTYL) 20 MG tablet Take 1 tablet (20 mg total) by mouth 2 (two) times daily.  (Patient not taking: Reported on 07/01/2017) 20 tablet 0  . Lorcaserin HCl 10 MG TABS Take 10 mg by mouth daily. (Patient not taking: Reported on 07/01/2017) 30 tablet 2  . [DISCONTINUED] Ranitidine HCl (ZANTAC PO) Take 1 tablet by mouth 2 (two) times daily. Patient uses this medication for heartburn.     No current facility-administered medications on file prior to visit.     Allergies  Allergen Reactions  . Contrast Media [Iodinated Diagnostic Agents] Hives  . Darvocet [Propoxyphene N-Acetaminophen] Hives and Itching  . Lisinopril Cough  . Metformin And Related Other (See Comments)    Chest pain  . Morphine And Related Nausea And Vomiting  . Percocet [Oxycodone-Acetaminophen] Itching    And headache  . Wellbutrin [Bupropion Hcl]     Hears voices  . Zofran [Ondansetron Hcl] Itching    Past Medical History:  Diagnosis Date  . Anxiety   . Arthritis   . Diabetes mellitus   . Endometriosis   . Hypersomnia, persistent    epworth 16   . Hypertension   . Lyme borreliosis   . Migraine   . Morbid obesity ([place])   . Obesity, Class III, BMI 40-49.9 (morbid obesity) ([place])   . Obstructive sleep apnea    CPAP NIGHTLY; sleep study 2007.  Marland Kitchen Renal disorder    kidney stone   Social History   Social History Narrative  Marital status: married x 25 years.      Children:  One son (3), no grandchildren, 24 dogs      Lives: with husband, son.      Employment: Museum/gallery curator part       Tobacco: 1/2 ppd x 15 years.      Alcohol: socially; special ocassions.      Drugs: none      Exercise: none   Caffeine Use: 2 cups daily   Social History  Substance Use Topics  . Smoking status: Current Every Day Smoker    Packs/day: 0.20    Years: 0.00    Types: Cigarettes  . Smokeless tobacco: Never Used  . Alcohol use No   family history includes Depression in her mother; Diabetes in her father, mother, and sister; Hypertension in her brother, father, and sister; Stroke in her mother; Thyroid  disease in her mother.     Objective:  BP (!) 154/80   Pulse 89   Temp 98.9 F (37.2 C) (Oral)   Resp 18   Ht '5\' 1"'$  (1.549 m)   Wt 250 lb 3.2 oz (113.5 kg)   SpO2 97%   BMI 47.27 kg/m  Body mass index is 47.27 kg/m.  Physical Exam  Constitutional: She is oriented to person, place, and time and well-developed, well-nourished, and in no distress.  HENT:  Head: Normocephalic and atraumatic.  Right Ear: External ear normal.  Left Ear: External ear normal.  Eyes: Conjunctivae are normal.  Cardiovascular: Normal rate, regular rhythm and normal heart sounds.   No murmur heard. Pulmonary/Chest: Effort normal and breath sounds normal.  Neurological: She is alert and oriented to person, place, and time. Gait normal.  Skin: Skin is warm and dry.  Psychiatric: Mood, memory, affect and judgment normal.  Vitals reviewed.   Assessment and Plan :  Type 2 diabetes mellitus without complication, without long-term current use of insulin (HCC) - Plan: Insulin Glargine (TOUJEO SOLOSTAR) 300 UNIT/ML SOPN, liraglutide (VICTOZA) 18 MG/3ML SOPN, Microalbumin, urine, CMP14+EGFR, Hemoglobin A1c, HM DIABETES FOOT EXAM, CANCELED: CMP14+EGFR, CANCELED: Hemoglobin A1c, CANCELED: Microalbumin, urine - pt does not want to have lab work done today because she is not fasting -   Colon, diverticulosis  Essential hypertension - Plan: metoprolol tartrate (LOPRESSOR) 50 MG tablet - continue treatment -   Bilateral leg edema - Plan: furosemide (LASIX) 20 MG tablet - uses prn she will continue  Endometriosis - Plan: diazepam (VALIUM) 2 MG tablet, HYDROcodone-acetaminophen (NORCO/VICODIN) 5-325 MG tablet, DISCONTINUED: HYDROcodone-acetaminophen (NORCO/VICODIN) 5-325 MG tablet  Pure hypercholesterolemia - Plan: rosuvastatin (CRESTOR) 40 MG tablet, Lipid panel, CANCELED: Lipid panel - continue medications - adjust as needed based on lab results  Moderate single current episode of major depressive disorder ([place]) -  Plan: FLUoxetine (PROZAC) 20 MG capsule - pt's depression is not controlled but she has not tolerated high dose and she is not interested in changing medications - she will continue using valium - she gets 40 a month and we will monitor the usage.  She is better at work but home is really difficult currently with her husbands illness.  Stress reaction - Plan: FLUoxetine (PROZAC) 20 MG capsule  Lumbar strain, initial encounter - Plan: celecoxib (CELEBREX) 200 MG capsule - use as needed  Screen for colon cancer - Plan: Cologuard  Windell Hummingbird PA-C  [facility] 07/02/2017 8:10 AM

## 2017-07-01 NOTE — Patient Instructions (Signed)
     IF you received an x-ray today, you will receive an invoice from [facility]. Please contact [facility] at [phone] with questions or concerns regarding your invoice.   IF you received labwork today, you will receive an invoice from [facility]. Please contact [facility] at [phone] with questions or concerns regarding your invoice.   Our billing staff will not be able to assist you with questions regarding bills from these companies.  You will be contacted with the lab results as soon as they are available. The fastest way to get your results is to activate your My Chart account. Instructions are located on the last page of this paperwork. If you have not heard from us regarding the results in 2 weeks, please contact this office.     

## 2017-07-03 ENCOUNTER — Other Ambulatory Visit: Payer: Self-pay | Admitting: Physician Assistant

## 2017-07-03 DIAGNOSIS — E119 Type 2 diabetes mellitus without complications: Secondary | ICD-10-CM

## 2017-07-11 ENCOUNTER — Other Ambulatory Visit: Payer: Self-pay

## 2017-07-11 DIAGNOSIS — E119 Type 2 diabetes mellitus without complications: Secondary | ICD-10-CM

## 2017-07-11 MED ORDER — LIRAGLUTIDE 18 MG/3ML ~~LOC~~ SOPN: PEN_INJECTOR | SUBCUTANEOUS | 0 refills | Status: DC

## 2017-07-16 ENCOUNTER — Telehealth: Payer: Self-pay | Admitting: Physician Assistant

## 2017-07-16 DIAGNOSIS — E119 Type 2 diabetes mellitus without complications: Secondary | ICD-10-CM

## 2017-07-16 NOTE — Telephone Encounter (Signed)
Pt called stated that she is unable to come in at the moment to do blood work and would like a refill on Insulin Glargine (TOUJEO SOLOSTAR) 300 UNIT/ML SOPN [[id number]]...  Please advise

## 2017-07-18 NOTE — Telephone Encounter (Signed)
Please advise 

## 2017-07-19 MED ORDER — INSULIN GLARGINE 300 UNIT/ML ~~LOC~~ SOPN: 80.0000 [IU] | PEN_INJECTOR | Freq: Every day | SUBCUTANEOUS | 0 refills | Status: DC

## 2017-07-19 NOTE — Telephone Encounter (Signed)
Done

## 2017-07-20 ENCOUNTER — Other Ambulatory Visit: Payer: Self-pay | Admitting: Physician Assistant

## 2017-07-20 DIAGNOSIS — F321 Major depressive disorder, single episode, moderate: Secondary | ICD-10-CM

## 2017-07-20 DIAGNOSIS — F430 Acute stress reaction: Secondary | ICD-10-CM

## 2017-07-20 NOTE — Telephone Encounter (Signed)
Please advise/refill  FLUoxetine (PROZAC)

## 2017-07-21 NOTE — Telephone Encounter (Signed)
Done

## 2017-07-26 ENCOUNTER — Other Ambulatory Visit: Payer: Self-pay

## 2017-07-26 DIAGNOSIS — R600 Localized edema: Secondary | ICD-10-CM

## 2017-07-26 MED ORDER — FUROSEMIDE 20 MG PO TABS: 10.0000 mg | ORAL_TABLET | Freq: Every day | ORAL | 0 refills | Status: DC

## 2017-07-30 ENCOUNTER — Other Ambulatory Visit: Payer: Self-pay | Admitting: Physician Assistant

## 2017-07-30 DIAGNOSIS — I10 Essential (primary) hypertension: Secondary | ICD-10-CM

## 2017-08-02 ENCOUNTER — Telehealth: Payer: Self-pay | Admitting: Physician Assistant

## 2017-08-02 DIAGNOSIS — E119 Type 2 diabetes mellitus without complications: Secondary | ICD-10-CM

## 2017-08-02 NOTE — Telephone Encounter (Signed)
Copied from [place] #[id number]. Topic: Quick Communication - See Telephone Encounter >> Aug 02, 2017  9:06 AM Burnis Medin, NT wrote: CRM for notification. See Telephone encounter for: Pt. Needs a refill of Insulin Glargine (TOUJEO SOLOSTAR) 300 UNIT/ML SOPN. Pt. Said she will run out by the week and needs refill as soon as possible. Pt. [facility]ses [facility] in [place].  08/02/17.

## 2017-08-02 NOTE — Telephone Encounter (Signed)
Please advise 

## 2017-08-03 ENCOUNTER — Other Ambulatory Visit: Payer: Self-pay | Admitting: Physician Assistant

## 2017-08-03 DIAGNOSIS — E119 Type 2 diabetes mellitus without complications: Secondary | ICD-10-CM

## 2017-08-03 NOTE — Telephone Encounter (Signed)
Please advise/refill TOUJEO SOLOSTAR-   disp amount?

## 2017-08-04 MED ORDER — INSULIN GLARGINE 300 UNIT/ML ~~LOC~~ SOPN: 80.0000 [IU] | PEN_INJECTOR | Freq: Every day | SUBCUTANEOUS | 0 refills | Status: DC

## 2017-08-05 NOTE — Telephone Encounter (Signed)
Enough for her to use 80 U for 30 d

## 2017-08-16 ENCOUNTER — Encounter: Payer: Self-pay | Admitting: Physician Assistant

## 2017-08-16 ENCOUNTER — Ambulatory Visit: Payer: BLUE CROSS/BLUE SHIELD | Admitting: Physician Assistant

## 2017-08-16 ENCOUNTER — Other Ambulatory Visit: Payer: Self-pay

## 2017-08-16 DIAGNOSIS — E119 Type 2 diabetes mellitus without complications: Secondary | ICD-10-CM | POA: Diagnosis not present

## 2017-08-16 DIAGNOSIS — N809 Endometriosis, unspecified: Secondary | ICD-10-CM

## 2017-08-16 DIAGNOSIS — E66813 Obesity, class 3: Secondary | ICD-10-CM

## 2017-08-16 MED ORDER — INSULIN GLARGINE 300 UNIT/ML ~~LOC~~ SOPN: 80.0000 [IU] | PEN_INJECTOR | Freq: Every day | SUBCUTANEOUS | 1 refills | Status: DC

## 2017-08-16 MED ORDER — LIRAGLUTIDE 18 MG/3ML ~~LOC~~ SOPN: PEN_INJECTOR | SUBCUTANEOUS | 1 refills | Status: DC

## 2017-08-16 MED ORDER — PHENTERMINE HCL 37.5 MG PO CAPS: 37.5000 mg | ORAL_CAPSULE | ORAL | 1 refills | Status: DC

## 2017-08-16 MED ORDER — DIAZEPAM 2 MG PO TABS: 1.0000 mg | ORAL_TABLET | Freq: Two times a day (BID) | ORAL | 0 refills | Status: AC | PRN

## 2017-08-16 MED ORDER — HYDROCODONE-ACETAMINOPHEN 5-325 MG PO TABS: 2.0000 | ORAL_TABLET | ORAL | 0 refills | Status: DC | PRN

## 2017-08-16 NOTE — Patient Instructions (Addendum)
If Judson Roch is not back when you make your next appointment, I will be happy to see you. Please call if there are any problems with your medications.   Thank you for coming in today. I hope you feel we met your needs.  Feel free to call PCP if you have any questions or further requests.  Please consider signing up for MyChart if you do not already have it, as this is a great way to communicate with me.  Best,  Whitney McVey, PA-C   IF you received an x-ray today, you will receive an invoice from [facility]. Please contact [facility] at [phone] with questions or concerns regarding your invoice.   IF you received labwork today, you will receive an invoice from [facility]. Please contact [facility] at [phone] with questions or concerns regarding your invoice.   Our billing staff will not be able to assist you with questions regarding bills from these companies.  You will be contacted with the lab results as soon as they are available. The fastest way to get your results is to activate your My Chart account. Instructions are located on the last page of this paperwork. If you have not heard from [place] regarding the results in 2 weeks, please contact this office.

## 2017-08-16 NOTE — Progress Notes (Signed)
[name]  MRN: [id number] DOB: [birth year]  PCP: Mancel Bale, PA-C  Subjective:  Pt is a pleasant 51 year old female PMH HTN, OSA, diverticulosis, DM, vitamin D insufficiency who presents to clinic for medication refill and labwork.  She is a pt of colleague PA Windell Hummingbird.   Pt continues to not eat a healthy diet, as she eats when she is stressed - mother and sister unexpectedly passed away this summer and her husband was recently diagnosed with liver cancer. "The chemo is gonna kill him"  Last OV 10/5 - Labs were not done because she ate before her appointment.   Valium - She takes Valium according to how her husband is doing. Sometimes she will take 1/4 pill, sometimes she takes 1/2 four times a day.   She would like to restart phentermine. She and her brother plan to start going to [employer]. She has been on this in the past. She has a goal to lose weight.  "I want to be around for a long time"   Norco 5 - for menstrual cramps. H/o endometriosis   Review of Systems  Respiratory: Negative for cough, chest tightness and shortness of breath.   Cardiovascular: Negative for chest pain and palpitations.  Endocrine: Negative for polydipsia, polyphagia and polyuria.  Genitourinary: Positive for menstrual problem (cramps).  Neurological: Negative for dizziness, light-headedness and headaches.  Psychiatric/Behavioral: Positive for dysphoric mood.    Patient Active Problem List   Diagnosis Date Noted  . Colon, diverticulosis 07/01/2017  . Anxiety 03/08/2016  . Chest pain 02/20/2016  . Vitamin D insufficiency 09/25/2015  . Endometriosis 01/17/2014  . Sleep apnea with use of continuous positive airway pressure (CPAP) 06/27/2013  . Nocturia 06/27/2013  . Obesity, Class III, BMI 40-49.9 (morbid obesity) ([place])   . Obstructive sleep apnea 02/15/2013  . Hypersomnia, persistent   . HTN (hypertension) 02/03/2013  . DM (diabetes mellitus), type 2 ([place]) 02/03/2013    Current  Outpatient Medications on File Prior to Visit  Medication Sig Dispense Refill  . celecoxib (CELEBREX) 200 MG capsule Take 1 capsule (200 mg total) by mouth daily. 90 capsule 1  . cyclobenzaprine (FLEXERIL) 5 MG tablet TAKE 1 TABLET (5 MG TOTAL) BY MOUTH 3 (THREE) TIMES DAILY AS NEEDED FOR MUSCLE SPASMS. 90 tablet 1  . diazepam (VALIUM) 2 MG tablet Take 0.5-1 tablets (1-2 mg total) by mouth every 12 (twelve) hours as needed for anxiety or muscle spasms. 40 tablet 0  . dicyclomine (BENTYL) 20 MG tablet Take 1 tablet (20 mg total) by mouth 2 (two) times daily. 20 tablet 0  . FLUoxetine (PROZAC) 20 MG capsule Take 1 capsule (20 mg total) by mouth 2 (two) times daily. 180 capsule 1  . FLUoxetine (PROZAC) 20 MG capsule TAKE 1 CAPSULE (20 MG TOTAL) BY MOUTH 2 (TWO) TIMES DAILY. 180 capsule 1  . furosemide (LASIX) 20 MG tablet Take 0.5-1 tablets (10-20 mg total) by mouth daily. 180 tablet 0  . glucose blood test strip Use as instructed 100 each 12  . HYDROcodone-acetaminophen (NORCO/VICODIN) 5-325 MG tablet Take 2 tablets by mouth every 4 (four) hours as needed. 20 tablet 0  . Insulin Glargine (TOUJEO SOLOSTAR) 300 UNIT/ML SOPN Inject 80 Units daily into the skin. 3 pen 0  . liraglutide (VICTOZA) 18 MG/3ML SOPN INJECT 0.3MLS SUBCUTANEOUSLY DAILY 15 pen 0  . Lorcaserin HCl 10 MG TABS Take 10 mg by mouth daily. 30 tablet 2  . metoprolol tartrate (LOPRESSOR) 50 MG tablet  TAKE 1 TABLET BY MOUTH 2 TIMES A DAY 60 tablet 0  . NOVOTWIST 32G X 5 MM MISC USE 1 APPLICATION BY DOES NOT APPLY ROUTE DAILY. 200 each 3  . rosuvastatin (CRESTOR) 40 MG tablet Take 1 tablet (40 mg total) by mouth at bedtime. 90 tablet 1  . [DISCONTINUED] Ranitidine HCl (ZANTAC PO) Take 1 tablet by mouth 2 (two) times daily. Patient uses this medication for heartburn.     No current facility-administered medications on file prior to visit.     Allergies  Allergen Reactions  . Contrast Media [Iodinated Diagnostic Agents] Hives  .  Darvocet [Propoxyphene N-Acetaminophen] Hives and Itching  . Lisinopril Cough  . Metformin And Related Other (See Comments)    Chest pain  . Morphine And Related Nausea And Vomiting  . Percocet [Oxycodone-Acetaminophen] Itching    And headache  . Wellbutrin [Bupropion Hcl]     Hears voices  . Zofran [Ondansetron Hcl] Itching     Objective:  BP 120/86   Pulse 66   Temp 98.4 F (36.9 C) (Oral)   Resp 16   Ht _0  (1.549 m)   Wt 254 lb 3.2 oz (115.3 kg)   SpO2 98%   BMI 48.03 kg/m   Physical Exam  Constitutional: She is oriented to person, place, and time and well-developed, well-nourished, and in no distress. No distress.  Obese  Cardiovascular: Normal rate, regular rhythm and normal heart sounds.  Neurological: She is alert and oriented to person, place, and time. GCS score is 15.  Skin: Skin is warm and dry.  Psychiatric: Mood, memory, affect and judgment normal.  Vitals reviewed.   Lab Results  Component Value Date   HGBA1C 7.2 (H) 09/08/2016    Assessment and Plan :  1. Type 2 diabetes mellitus without complication, without long-term current use of insulin (HCC) - liraglutide (VICTOZA) 18 MG/3ML SOPN; INJECT 0.3MLS SUBCUTANEOUSLY DAILY  Dispense: 15 pen; Refill: 1 - Insulin Glargine (TOUJEO SOLOSTAR) 300 UNIT/ML SOPN; Inject 80 Units daily into the skin.  Dispense: 18 pen; Refill: 1 - Hemoglobin A1c - Lipid panel - CMP14+EGFR - Routine labs are pending. Will contact with results. Encouraged DM friendly diet . 2. Endometriosis - HYDROcodone-acetaminophen (NORCO/VICODIN) 5-325 MG tablet; Take 2 tablets every 4 (four) hours as needed by mouth.  Dispense: 20 tablet; Refill: 0 - diazepam (VALIUM) 2 MG tablet; Take 0.5-1 tablets (1-2 mg total) every 12 (twelve) hours as needed by mouth for anxiety or muscle spasms.  Dispense: 40 tablet; Refill: 0  3. Obesity, Class III, BMI 40-49.9 (morbid obesity) (HCC) - phentermine 37.5 MG capsule; Take 1 capsule (37.5 mg total)  every morning by mouth.  Dispense: 30 capsule; Refill: 1 - She plans on working out at [employer] with her brother on a regular basis, "I want to be around for a while".   Mercer Pod, PA-C  [facility] 08/16/2017 8:39 AM

## 2017-08-17 LAB — LIPID PANEL
Chol/HDL Ratio: 7.9 ratio — ABNORMAL HIGH (ref 0.0–4.4)
Cholesterol, Total: 278 mg/dL — ABNORMAL HIGH (ref 100–199)
HDL: 35 mg/dL — ABNORMAL LOW (ref >39)
Triglycerides: 441 mg/dL — ABNORMAL HIGH (ref 0–149)

## 2017-08-17 LAB — CMP14+EGFR
ALT: 20 IU/L (ref 0–32)
AST: 16 IU/L (ref 0–40)
Albumin/Globulin Ratio: 1.7 (ref 1.2–2.2)
Albumin: 4.3 g/dL (ref 3.5–5.5)
Alkaline Phosphatase: 108 IU/L (ref 39–117)
BUN/Creatinine Ratio: 23 (ref 9–23)
BUN: 19 mg/dL (ref 6–24)
Bilirubin Total: <0.2 mg/dL (ref 0.0–1.2)
CO2: 22 mmol/L (ref 20–29)
Calcium: 9.6 mg/dL (ref 8.7–10.2)
Chloride: 105 mmol/L (ref 96–106)
Creatinine, Ser: 0.81 mg/dL (ref 0.57–1.00)
GFR calc Af Amer: 98 mL/min/{1.73_m2} (ref >59)
GFR calc non Af Amer: 85 mL/min/{1.73_m2} (ref >59)
Globulin, Total: 2.6 g/dL (ref 1.5–4.5)
Glucose: 130 mg/dL — ABNORMAL HIGH (ref 65–99)
Potassium: 4.5 mmol/L (ref 3.5–5.2)
Sodium: 143 mmol/L (ref 134–144)
Total Protein: 6.9 g/dL (ref 6.0–8.5)

## 2017-08-17 LAB — HEMOGLOBIN A1C
Est. average glucose Bld gHb Est-mCnc: 171 mg/dL
Hgb A1c MFr Bld: 7.6 % — ABNORMAL HIGH (ref 4.8–5.6)

## 2017-08-19 ENCOUNTER — Encounter: Payer: Self-pay | Admitting: Physician Assistant

## 2017-08-19 ENCOUNTER — Other Ambulatory Visit: Payer: Self-pay | Admitting: Physician Assistant

## 2017-08-19 DIAGNOSIS — E7849 Other hyperlipidemia: Secondary | ICD-10-CM

## 2017-08-19 MED ORDER — ATORVASTATIN CALCIUM 40 MG PO TABS: 40.0000 mg | ORAL_TABLET | Freq: Every day | ORAL | 3 refills | Status: DC

## 2017-08-19 MED ORDER — GEMFIBROZIL 600 MG PO TABS: 600.0000 mg | ORAL_TABLET | Freq: Two times a day (BID) | ORAL | 6 refills | Status: DC

## 2017-08-19 NOTE — Progress Notes (Signed)
Please call pt and let her know her cholesterol levels are very high - triglycerides are 441, your LDL (bad cholesterol) continues to rise and your HDL ("good" cholesterol) continues to lower. High cholesterol levels puts you at a much higher risk for a heart attack or stroke.  She needs to: 1) Stop taking Rosuvastatin  2) Start taking Atorvastatin  3) Start taking Gemfibrozole 600mg  twice daily 30 minutes before breakfast and dinner These medications should be at her pharmacy.  Also consider a fish oil supplement and eating fiber-rich foods. Exercise will help these levels come back down considerably.   I sent a letter in the mail with more details.    Come back and see me in 3 months to recheck your lipid levels. Please be fasting.   Thank you!

## 2017-08-22 ENCOUNTER — Telehealth: Payer: Self-pay | Admitting: Physician Assistant

## 2017-08-22 NOTE — Telephone Encounter (Signed)
Results given Pt will call back to make appt for lab  Notes recorded by McVey, Gelene Mink, PA-C on 08/19/2017 at 11:33 AM EST Please call pt and let her know her cholesterol levels are very high - triglycerides are 441, your LDL (bad cholesterol) continues to rise and your HDL ("good" cholesterol) continues to lower. High cholesterol levels puts you at a much higher risk for a heart attack or stroke.  She needs to: 1) Stop taking Rosuvastatin  2) Start taking Atorvastatin  3) Start taking Gemfibrozole 600mg  twice daily 30 minutes before breakfast and dinner These medications should be at her pharmacy.  Also consider a fish oil supplement and eating fiber-rich foods. Exercise will help these levels come back down considerably.   I sent a letter in the mail with more details.   Come back and see me in 3 months to recheck your lipid levels. Please be fasting.   Thank you!

## 2017-08-22 NOTE — Telephone Encounter (Signed)
Left message to return call for lab results.

## 2017-08-25 ENCOUNTER — Telehealth: Payer: Self-pay | Admitting: *Deleted

## 2017-08-25 NOTE — Telephone Encounter (Signed)
Pt advised regarding lab results. She wanted to let you know that she have been watching what she eats and have lost 7 pounds. She was advised to follow up in 3 months for fasting labs.

## 2017-08-31 ENCOUNTER — Encounter (HOSPITAL_BASED_OUTPATIENT_CLINIC_OR_DEPARTMENT_OTHER): Payer: Self-pay | Admitting: Emergency Medicine

## 2017-08-31 ENCOUNTER — Emergency Department (HOSPITAL_BASED_OUTPATIENT_CLINIC_OR_DEPARTMENT_OTHER)
Admission: EM | Admit: 2017-08-31 | Discharge: 2017-08-31 | Disposition: A | Discharge status: Home / Self Care | Payer: BLUE CROSS/BLUE SHIELD | Attending: Emergency Medicine | Admitting: Emergency Medicine

## 2017-08-31 ENCOUNTER — Other Ambulatory Visit: Payer: Self-pay

## 2017-08-31 ENCOUNTER — Other Ambulatory Visit: Payer: Self-pay | Admitting: Physician Assistant

## 2017-08-31 DIAGNOSIS — E119 Type 2 diabetes mellitus without complications: Secondary | ICD-10-CM | POA: Diagnosis not present

## 2017-08-31 DIAGNOSIS — Z9049 Acquired absence of other specified parts of digestive tract: Secondary | ICD-10-CM | POA: Diagnosis not present

## 2017-08-31 DIAGNOSIS — M5441 Lumbago with sciatica, right side: Secondary | ICD-10-CM | POA: Diagnosis not present

## 2017-08-31 DIAGNOSIS — F419 Anxiety disorder, unspecified: Secondary | ICD-10-CM | POA: Insufficient documentation

## 2017-08-31 DIAGNOSIS — Z79899 Other long term (current) drug therapy: Secondary | ICD-10-CM | POA: Diagnosis not present

## 2017-08-31 DIAGNOSIS — I10 Essential (primary) hypertension: Secondary | ICD-10-CM

## 2017-08-31 DIAGNOSIS — F17210 Nicotine dependence, cigarettes, uncomplicated: Secondary | ICD-10-CM | POA: Insufficient documentation

## 2017-08-31 DIAGNOSIS — Z794 Long term (current) use of insulin: Secondary | ICD-10-CM | POA: Diagnosis not present

## 2017-08-31 DIAGNOSIS — M545 Low back pain: Secondary | ICD-10-CM | POA: Diagnosis not present

## 2017-08-31 LAB — URINALYSIS, ROUTINE W REFLEX MICROSCOPIC
Bilirubin Urine: NEGATIVE
GLUCOSE, UA: NEGATIVE mg/dL
HGB URINE DIPSTICK: NEGATIVE
Ketones, ur: NEGATIVE mg/dL
LEUKOCYTES UA: NEGATIVE
Nitrite: NEGATIVE
PROTEIN: NEGATIVE mg/dL
pH: 5.5 (ref 5.0–8.0)

## 2017-08-31 MED ORDER — KETOROLAC TROMETHAMINE 60 MG/2ML IM SOLN
60.0000 mg | Freq: Once | INTRAMUSCULAR | Status: AC
  Administered 2017-08-31: 60 mg via INTRAMUSCULAR
  Filled 2017-08-31: qty 2

## 2017-08-31 MED ORDER — ACETAMINOPHEN 325 MG PO TABS
650.0000 mg | ORAL_TABLET | Freq: Once | ORAL | Status: AC
  Administered 2017-08-31: 650 mg via ORAL
  Filled 2017-08-31: qty 2

## 2017-08-31 MED ORDER — METHOCARBAMOL 500 MG PO TABS
1000.0000 mg | ORAL_TABLET | Freq: Once | ORAL | Status: DC
  Filled 2017-08-31: qty 2

## 2017-08-31 NOTE — ED Notes (Signed)
Pt requesting pain meds, EDP made aware.

## 2017-08-31 NOTE — ED Notes (Signed)
ED Provider at bedside. 

## 2017-08-31 NOTE — ED Provider Notes (Signed)
[facility] EMERGENCY DEPARTMENT Provider Note   CSN: [id number] Arrival date & time: 08/31/17  7371     History   Chief Complaint Chief Complaint  Patient presents with  . Back Pain    HPI [name] is a 51 y.o. female.  HPI Patient presents with right-sided low back pain radiating to the right buttock.  States that started yesterday evening.  No known exacerbating factors.  Has some urinary frequency but denies dysuria, urgency or hematuria.  No fever or chills.  Patient denies any leg weakness or numbness.  States she took hydrocodone and Flexeril yesterday evening without relief. Past Medical History:  Diagnosis Date  . Anxiety   . Arthritis   . Diabetes mellitus   . Endometriosis   . Hypersomnia, persistent    epworth 16   . Hypertension   . Lyme borreliosis   . Migraine   . Morbid obesity ([place])   . Obesity, Class III, BMI 40-49.9 (morbid obesity) ([place])   . Obstructive sleep apnea    CPAP NIGHTLY; sleep study 2007.  Marland Kitchen Renal disorder    kidney stone    Patient Active Problem List   Diagnosis Date Noted  . Colon, diverticulosis 07/01/2017  . Anxiety 03/08/2016  . Chest pain 02/20/2016  . Vitamin D insufficiency 09/25/2015  . Endometriosis 01/17/2014  . Sleep apnea with use of continuous positive airway pressure (CPAP) 06/27/2013  . Nocturia 06/27/2013  . Obesity, Class III, BMI 40-49.9 (morbid obesity) ([place])   . Obstructive sleep apnea 02/15/2013  . Hypersomnia, persistent   . HTN (hypertension) 02/03/2013  . DM (diabetes mellitus), type 2 ([place]) 02/03/2013    Past Surgical History:  Procedure Laterality Date  . ABLATION ON ENDOMETRIOSIS    . ABLATION ON ENDOMETRIOSIS     10 years ago  . CARDIAC CATHETERIZATION N/A 02/20/2016   Procedure: Left Heart Cath and Coronary Angiography;  Surgeon: [name], MD;  Location: [facility];  Service: Cardiovascular;  Laterality: N/A;  . CHOLECYSTECTOMY    . KNEE ARTHROCENTESIS    . KNEE  SURGERY    . TUBAL LIGATION      OB History    No data available       Home Medications    Prior to Admission medications   Medication Sig Start Date End Date Taking? Authorizing Provider  atorvastatin (LIPITOR) 40 MG tablet Take 1 tablet (40 mg total) by mouth daily. 08/19/17   McVey, Gelene Mink, PA-C  celecoxib (CELEBREX) 200 MG capsule Take 1 capsule (200 mg total) by mouth daily. 07/01/17   Weber, Damaris Hippo, PA-C  cyclobenzaprine (FLEXERIL) 5 MG tablet TAKE 1 TABLET (5 MG TOTAL) BY MOUTH 3 (THREE) TIMES DAILY AS NEEDED FOR MUSCLE SPASMS. 06/01/16   Gale Journey, Damaris Hippo, PA-C  diazepam (VALIUM) 2 MG tablet Take 0.5-1 tablets (1-2 mg total) every 12 (twelve) hours as needed by mouth for anxiety or muscle spasms. 08/16/17   McVey, Gelene Mink, PA-C  dicyclomine (BENTYL) 20 MG tablet Take 1 tablet (20 mg total) by mouth 2 (two) times daily. 05/27/17   Tanna Furry, MD  FLUoxetine (PROZAC) 20 MG capsule Take 1 capsule (20 mg total) by mouth 2 (two) times daily. 07/01/17   Weber, Damaris Hippo, PA-C  FLUoxetine (PROZAC) 20 MG capsule TAKE 1 CAPSULE (20 MG TOTAL) BY MOUTH 2 (TWO) TIMES DAILY. 07/21/17   Weber, Damaris Hippo, PA-C  furosemide (LASIX) 20 MG tablet Take 0.5-1 tablets (10-20 mg total) by mouth daily. 07/26/17  Weber, Damaris Hippo, PA-C  gemfibrozil (LOPID) 600 MG tablet Take 1 tablet (600 mg total) by mouth 2 (two) times daily before a meal. 08/19/17   McVey, Gelene Mink, PA-C  glucose blood test strip Use as instructed 06/01/16   Weber, Damaris Hippo, PA-C  HYDROcodone-acetaminophen (NORCO/VICODIN) 5-325 MG tablet Take 2 tablets every 4 (four) hours as needed by mouth. 08/16/17   McVey, Gelene Mink, PA-C  Insulin Glargine (TOUJEO SOLOSTAR) 300 UNIT/ML SOPN Inject 80 Units daily into the skin. 08/16/17   McVey, Gelene Mink, PA-C  liraglutide (VICTOZA) 18 MG/3ML SOPN INJECT 0.3MLS SUBCUTANEOUSLY DAILY 08/16/17   McVey, Gelene Mink, PA-C  Lorcaserin HCl 10 MG TABS Take 10 mg by  mouth daily. 11/13/16   Weber, Damaris Hippo, PA-C  metoprolol tartrate (LOPRESSOR) 50 MG tablet TAKE 1 TABLET BY MOUTH TWICE A DAY 08/31/17   Weber, Sarah L, PA-C  NOVOTWIST 32G X 5 MM MISC USE 1 APPLICATION BY DOES NOT APPLY ROUTE DAILY. 06/01/16   Gale Journey, Damaris Hippo, PA-C  phentermine 37.5 MG capsule Take 1 capsule (37.5 mg total) every morning by mouth. 08/16/17   McVey, Gelene Mink, PA-C    Family History Family History  Problem Relation Age of Onset  . Diabetes Mother   . Thyroid disease Mother   . Depression Mother        with anxiety  . Stroke Mother   . Diabetes Father   . Hypertension Father   . Diabetes Sister   . Hypertension Sister   . Hypertension Brother     Social History Social History   Tobacco Use  . Smoking status: Current Every Day Smoker    Packs/day: 0.20    Years: 0.00    Pack years: 0.00    Types: Cigarettes  . Smokeless tobacco: Never Used  Substance Use Topics  . Alcohol use: No  . Drug use: No     Allergies   Contrast media [iodinated diagnostic agents]; Darvocet [propoxyphene n-acetaminophen]; Lisinopril; Metformin and related; Morphine and related; Percocet [oxycodone-acetaminophen]; Wellbutrin [bupropion hcl]; and Zofran [ondansetron hcl]   Review of Systems Review of Systems  Constitutional: Negative for chills and fever.  Respiratory: Negative for shortness of breath.   Cardiovascular: Negative for chest pain.  Gastrointestinal: Positive for nausea. Negative for abdominal pain, constipation, diarrhea and vomiting.  Genitourinary: Positive for frequency. Negative for dysuria and hematuria.  Musculoskeletal: Positive for back pain and myalgias. Negative for neck pain and neck stiffness.  Skin: Negative for rash and wound.  Neurological: Negative for dizziness, weakness, numbness and headaches.  All other systems reviewed and are negative.    Physical Exam Updated Vital Signs BP (!) 146/75   Pulse 86   Temp 98.2 F (36.8 C)   Ht 5\' 1"   (1.549 m)   Wt 112.9 kg (249 lb)   SpO2 98%   BMI 47.05 kg/m   Physical Exam  Constitutional: She is oriented to person, place, and time. She appears well-developed and well-nourished.  HENT:  Head: Normocephalic and atraumatic.  Mouth/Throat: Oropharynx is clear and moist.  Eyes: EOM are normal. Pupils are equal, round, and reactive to light.  Neck: Normal range of motion. Neck supple.  Cardiovascular: Normal rate and regular rhythm.  Pulmonary/Chest: Effort normal and breath sounds normal.  Abdominal: Soft. Bowel sounds are normal. There is no tenderness. There is no rebound and no guarding.  Musculoskeletal: Normal range of motion. She exhibits tenderness. She exhibits no edema.  Right lumbar paraspinal tenderness to palpation.  No definite CVA tenderness.  No midline thoracic or lumbar tenderness.  Negative straight leg raise bilaterally.  Distal pulses are 2+.  Neurological: She is alert and oriented to person, place, and time.  5/5 motor in all extremities.  Sensation fully intact.  No saddle anesthesia.  Skin: Skin is warm and dry. Capillary refill takes less than 2 seconds. No rash noted. No erythema.  Psychiatric: She has a normal mood and affect. Her behavior is normal.  Nursing note and vitals reviewed.    ED Treatments / Results  Labs (all labs ordered are listed, but only abnormal results are displayed) Labs Reviewed  URINALYSIS, ROUTINE W REFLEX MICROSCOPIC - Abnormal; Notable for the following components:      Result Value   Specific Gravity, Urine >1.030 (*)    All other components within normal limits    EKG  EKG Interpretation None       Radiology No results found.  Procedures Procedures (including critical care time)  Medications Ordered in ED Medications  ketorolac (TORADOL) injection 60 mg (60 mg Intramuscular Given 08/31/17 0805)  acetaminophen (TYLENOL) tablet 650 mg (650 mg Oral Given 08/31/17 0914)     Initial Impression / Assessment and  Plan / ED Course  I have reviewed the triage vital signs and the nursing notes.  Pertinent labs & imaging results that were available during my care of the patient were reviewed by me and considered in my medical decision making (see chart for details).    No red flag signs or symptoms.  Patient's urine without evidence of infection or hematuria.  Will treat symptomatically.  Advised to follow-up closely with her primary physician.   Final Clinical Impressions(s) / ED Diagnoses   Final diagnoses:  Acute right-sided low back pain with right-sided sciatica    ED Discharge Orders    None       Julianne Rice, MD 08/31/17 1409

## 2017-08-31 NOTE — ED Triage Notes (Signed)
Pt c/o lower back pain radiating down her right leg.  Pt c/o frequency but no urgency or burning.

## 2017-09-15 ENCOUNTER — Telehealth: Payer: Self-pay | Admitting: Physician Assistant

## 2017-09-15 NOTE — Telephone Encounter (Signed)
Pt would like a refill on her HYDROcodone-acetaminophen (NORCO/VICODIN) 5-325 MG tablet [[id number]]. Please advise at [phone]

## 2017-09-16 NOTE — Telephone Encounter (Signed)
Req for refill Vicodin sent to [facility]

## 2017-09-29 ENCOUNTER — Other Ambulatory Visit: Payer: Self-pay | Admitting: Physician Assistant

## 2017-09-29 DIAGNOSIS — N809 Endometriosis, unspecified: Secondary | ICD-10-CM

## 2017-09-29 MED ORDER — HYDROCODONE-ACETAMINOPHEN 5-325 MG PO TABS: 2.0000 | ORAL_TABLET | ORAL | 0 refills | Status: DC | PRN

## 2017-10-12 NOTE — Telephone Encounter (Signed)
Patient still needing this refill of vicodin, also will need refill on phentermine 37.5. Please Adivse [phone]

## 2017-10-13 NOTE — Telephone Encounter (Signed)
Advised pt to schedule OV.

## 2017-10-27 DIAGNOSIS — M5431 Sciatica, right side: Secondary | ICD-10-CM | POA: Diagnosis not present

## 2017-10-27 DIAGNOSIS — M9901 Segmental and somatic dysfunction of cervical region: Secondary | ICD-10-CM | POA: Diagnosis not present

## 2017-10-31 DIAGNOSIS — M9901 Segmental and somatic dysfunction of cervical region: Secondary | ICD-10-CM | POA: Diagnosis not present

## 2017-10-31 DIAGNOSIS — M5431 Sciatica, right side: Secondary | ICD-10-CM | POA: Diagnosis not present

## 2017-11-01 DIAGNOSIS — M9901 Segmental and somatic dysfunction of cervical region: Secondary | ICD-10-CM | POA: Diagnosis not present

## 2017-11-01 DIAGNOSIS — M5431 Sciatica, right side: Secondary | ICD-10-CM | POA: Diagnosis not present

## 2017-11-02 DIAGNOSIS — M5431 Sciatica, right side: Secondary | ICD-10-CM | POA: Diagnosis not present

## 2017-11-02 DIAGNOSIS — M9901 Segmental and somatic dysfunction of cervical region: Secondary | ICD-10-CM | POA: Diagnosis not present

## 2017-11-11 ENCOUNTER — Emergency Department (HOSPITAL_BASED_OUTPATIENT_CLINIC_OR_DEPARTMENT_OTHER)
Admission: EM | Admit: 2017-11-11 | Discharge: 2017-11-11 | Disposition: A | Discharge status: Home / Self Care | Payer: BLUE CROSS/BLUE SHIELD | Attending: Emergency Medicine | Admitting: Emergency Medicine

## 2017-11-11 ENCOUNTER — Encounter (HOSPITAL_BASED_OUTPATIENT_CLINIC_OR_DEPARTMENT_OTHER): Payer: Self-pay | Admitting: Emergency Medicine

## 2017-11-11 ENCOUNTER — Other Ambulatory Visit: Payer: Self-pay

## 2017-11-11 ENCOUNTER — Emergency Department (HOSPITAL_BASED_OUTPATIENT_CLINIC_OR_DEPARTMENT_OTHER): Payer: BLUE CROSS/BLUE SHIELD

## 2017-11-11 DIAGNOSIS — R109 Unspecified abdominal pain: Secondary | ICD-10-CM | POA: Diagnosis not present

## 2017-11-11 DIAGNOSIS — R1012 Left upper quadrant pain: Secondary | ICD-10-CM | POA: Diagnosis not present

## 2017-11-11 DIAGNOSIS — R10812 Left upper quadrant abdominal tenderness: Secondary | ICD-10-CM | POA: Diagnosis not present

## 2017-11-11 DIAGNOSIS — F17210 Nicotine dependence, cigarettes, uncomplicated: Secondary | ICD-10-CM | POA: Diagnosis not present

## 2017-11-11 DIAGNOSIS — Z794 Long term (current) use of insulin: Secondary | ICD-10-CM | POA: Insufficient documentation

## 2017-11-11 DIAGNOSIS — Z79899 Other long term (current) drug therapy: Secondary | ICD-10-CM | POA: Insufficient documentation

## 2017-11-11 DIAGNOSIS — E119 Type 2 diabetes mellitus without complications: Secondary | ICD-10-CM | POA: Diagnosis not present

## 2017-11-11 DIAGNOSIS — I10 Essential (primary) hypertension: Secondary | ICD-10-CM | POA: Diagnosis not present

## 2017-11-11 LAB — URINALYSIS, ROUTINE W REFLEX MICROSCOPIC
BILIRUBIN URINE: NEGATIVE
Glucose, UA: NEGATIVE mg/dL
KETONES UR: NEGATIVE mg/dL
Leukocytes, UA: NEGATIVE
NITRITE: NEGATIVE
PH: 5.5 (ref 5.0–8.0)
Protein, ur: NEGATIVE mg/dL
Specific Gravity, Urine: 1.025 (ref 1.005–1.030)

## 2017-11-11 LAB — CBC WITH DIFFERENTIAL/PLATELET
Basophils Absolute: 0.1 10*3/uL (ref 0.0–0.1)
Basophils Relative: 1 %
EOS PCT: 2 %
Eosinophils Absolute: 0.2 10*3/uL (ref 0.0–0.7)
HEMATOCRIT: 42.7 % (ref 36.0–46.0)
Hemoglobin: 14.7 g/dL (ref 12.0–15.0)
LYMPHS ABS: 2.8 10*3/uL (ref 0.7–4.0)
LYMPHS PCT: 21 %
MCH: 30.1 pg (ref 26.0–34.0)
MCHC: 34.4 g/dL (ref 30.0–36.0)
MCV: 87.3 fL (ref 78.0–100.0)
MONO ABS: 0.9 10*3/uL (ref 0.1–1.0)
MONOS PCT: 7 %
Neutro Abs: 9.3 10*3/uL — ABNORMAL HIGH (ref 1.7–7.7)
Neutrophils Relative %: 69 %
PLATELETS: 294 10*3/uL (ref 150–400)
RBC: 4.89 MIL/uL (ref 3.87–5.11)
RDW: 14.6 % (ref 11.5–15.5)
WBC: 13.2 10*3/uL — ABNORMAL HIGH (ref 4.0–10.5)

## 2017-11-11 LAB — TROPONIN I: Troponin I: <0.03 ng/mL (ref <0.03)

## 2017-11-11 LAB — COMPREHENSIVE METABOLIC PANEL
ALBUMIN: 4.4 g/dL (ref 3.5–5.0)
ALK PHOS: 103 U/L (ref 38–126)
ALT: 31 U/L (ref 14–54)
AST: 25 U/L (ref 15–41)
Anion gap: 11 (ref 5–15)
BUN: 17 mg/dL (ref 6–20)
CO2: 24 mmol/L (ref 22–32)
CREATININE: 0.79 mg/dL (ref 0.44–1.00)
Calcium: 9.6 mg/dL (ref 8.9–10.3)
Chloride: 104 mmol/L (ref 101–111)
GFR calc Af Amer: >60 mL/min (ref >60)
GFR calc non Af Amer: >60 mL/min (ref >60)
GLUCOSE: 146 mg/dL — AB (ref 65–99)
Potassium: 3.5 mmol/L (ref 3.5–5.1)
SODIUM: 139 mmol/L (ref 135–145)
Total Bilirubin: 0.6 mg/dL (ref 0.3–1.2)
Total Protein: 7.9 g/dL (ref 6.5–8.1)

## 2017-11-11 LAB — URINALYSIS, MICROSCOPIC (REFLEX)

## 2017-11-11 LAB — PREGNANCY, URINE: Preg Test, Ur: NEGATIVE

## 2017-11-11 LAB — LIPASE, BLOOD: Lipase: 62 U/L — ABNORMAL HIGH (ref 11–51)

## 2017-11-11 MED ORDER — FENTANYL CITRATE (PF) 100 MCG/2ML IJ SOLN
100.0000 ug | Freq: Once | INTRAMUSCULAR | Status: AC
  Administered 2017-11-11: 100 ug via INTRAVENOUS
  Filled 2017-11-11: qty 2

## 2017-11-11 MED ORDER — PROMETHAZINE HCL 25 MG/ML IJ SOLN
25.0000 mg | Freq: Once | INTRAMUSCULAR | Status: AC
  Administered 2017-11-11: 25 mg via INTRAVENOUS
  Filled 2017-11-11: qty 1

## 2017-11-11 MED ORDER — SODIUM CHLORIDE 0.9 % IV BOLUS (SEPSIS)
500.0000 mL | Freq: Once | INTRAVENOUS | Status: AC
  Administered 2017-11-11: 500 mL via INTRAVENOUS

## 2017-11-11 MED ORDER — PROMETHAZINE HCL 25 MG PO TABS: 25.0000 mg | ORAL_TABLET | Freq: Three times a day (TID) | ORAL | 0 refills | Status: DC | PRN

## 2017-11-11 MED FILL — PROMETHAZINE 25 MG TABLET: 25 | 3 days supply | Qty: 10 | Fill #0

## 2017-11-11 NOTE — Discharge Instructions (Signed)
The cause of your pain was not identified today.  You can use warm compresses at home and you can try lidoderm patches, available over the counter for pain.

## 2017-11-11 NOTE — ED Triage Notes (Signed)
Left flank pain and left sided abd pain, upper and lower, since 4am.  Slight nausea but no vomiting or diarrhea.

## 2017-11-11 NOTE — ED Provider Notes (Signed)
[facility] EMERGENCY DEPARTMENT Provider Note   CSN: [id number] Arrival date & time: 11/11/17  0702     History   Chief Complaint Chief Complaint  Patient presents with  . Flank Pain    HPI [name] is a 52 y.o. female.  The history is provided by the patient. No language interpreter was used.  Flank Pain    [name] is a 52 y.o. female who presents to the Emergency Department complaining of flank pain. She reports severe left flank pain that woke her from sleep at 4 am.  Pain is constant in nature and radiates to LUQ, LLQ.  She has associated nausea and it feels like her breath is being taken away.  She denies fevers, vomiting, diarrhea, dysuria. She does have a strong odor to her urine and it is very yellow.  She has a hx/o kidney stones in the past but this pain is different because she never had associated pain to palpation and her stomach never felt swollen with kidney stones previously.   Past Medical History:  Diagnosis Date  . Anxiety   . Arthritis   . Diabetes mellitus   . Endometriosis   . Hypersomnia, persistent    epworth 16   . Hypertension   . Lyme borreliosis   . Migraine   . Morbid obesity ([place])   . Obesity, Class III, BMI 40-49.9 (morbid obesity) ([place])   . Obstructive sleep apnea    CPAP NIGHTLY; sleep study 2007.  Marland Kitchen Renal disorder    kidney stone    Patient Active Problem List   Diagnosis Date Noted  . Colon, diverticulosis 07/01/2017  . Anxiety 03/08/2016  . Chest pain 02/20/2016  . Vitamin D insufficiency 09/25/2015  . Endometriosis 01/17/2014  . Sleep apnea with use of continuous positive airway pressure (CPAP) 06/27/2013  . Nocturia 06/27/2013  . Obesity, Class III, BMI 40-49.9 (morbid obesity) ([place])   . Obstructive sleep apnea 02/15/2013  . Hypersomnia, persistent   . HTN (hypertension) 02/03/2013  . DM (diabetes mellitus), type 2 ([place]) 02/03/2013    Past Surgical History:  Procedure Laterality Date  . ABLATION  ON ENDOMETRIOSIS    . ABLATION ON ENDOMETRIOSIS     10 years ago  . CARDIAC CATHETERIZATION N/A 02/20/2016   Procedure: Left Heart Cath and Coronary Angiography;  Surgeon: [name], MD;  Location: [facility];  Service: Cardiovascular;  Laterality: N/A;  . CHOLECYSTECTOMY    . KNEE ARTHROCENTESIS    . KNEE SURGERY    . TUBAL LIGATION      OB History    No data available       Home Medications    Prior to Admission medications   Medication Sig Start Date End Date Taking? Authorizing Provider  atorvastatin (LIPITOR) 40 MG tablet Take 1 tablet (40 mg total) by mouth daily. 08/19/17   McVey, Gelene Mink, PA-C  celecoxib (CELEBREX) 200 MG capsule Take 1 capsule (200 mg total) by mouth daily. 07/01/17   Weber, Damaris Hippo, PA-C  cyclobenzaprine (FLEXERIL) 5 MG tablet TAKE 1 TABLET (5 MG TOTAL) BY MOUTH 3 (THREE) TIMES DAILY AS NEEDED FOR MUSCLE SPASMS. 06/01/16   Gale Journey, Damaris Hippo, PA-C  diazepam (VALIUM) 2 MG tablet Take 0.5-1 tablets (1-2 mg total) every 12 (twelve) hours as needed by mouth for anxiety or muscle spasms. 08/16/17   McVey, Gelene Mink, PA-C  dicyclomine (BENTYL) 20 MG tablet Take 1 tablet (20 mg total) by mouth 2 (two) times daily.  05/27/17   Tanna Furry, MD  FLUoxetine (PROZAC) 20 MG capsule Take 1 capsule (20 mg total) by mouth 2 (two) times daily. 07/01/17   Weber, Damaris Hippo, PA-C  FLUoxetine (PROZAC) 20 MG capsule TAKE 1 CAPSULE (20 MG TOTAL) BY MOUTH 2 (TWO) TIMES DAILY. 07/21/17   Weber, Damaris Hippo, PA-C  furosemide (LASIX) 20 MG tablet Take 0.5-1 tablets (10-20 mg total) by mouth daily. 07/26/17   Gale Journey, Damaris Hippo, PA-C  gemfibrozil (LOPID) 600 MG tablet Take 1 tablet (600 mg total) by mouth 2 (two) times daily before a meal. 08/19/17   McVey, Gelene Mink, PA-C  glucose blood test strip Use as instructed 06/01/16   Weber, Damaris Hippo, PA-C  HYDROcodone-acetaminophen (NORCO/VICODIN) 5-325 MG tablet Take 2 tablets by mouth every 4 (four) hours as needed. 09/29/17    McVey, Gelene Mink, PA-C  Insulin Glargine (TOUJEO SOLOSTAR) 300 UNIT/ML SOPN Inject 80 Units daily into the skin. 08/16/17   McVey, Gelene Mink, PA-C  liraglutide (VICTOZA) 18 MG/3ML SOPN INJECT 0.3MLS SUBCUTANEOUSLY DAILY 08/16/17   McVey, Gelene Mink, PA-C  Lorcaserin HCl 10 MG TABS Take 10 mg by mouth daily. 11/13/16   Gale Journey, Damaris Hippo, PA-C  metoprolol tartrate (LOPRESSOR) 50 MG tablet TAKE 1 TABLET BY MOUTH TWICE A DAY 08/31/17   Weber, Sarah L, PA-C  NOVOTWIST 32G X 5 MM MISC USE 1 APPLICATION BY DOES NOT APPLY ROUTE DAILY. 06/01/16   Gale Journey, Damaris Hippo, PA-C  phentermine 37.5 MG capsule Take 1 capsule (37.5 mg total) every morning by mouth. 08/16/17   McVey, Gelene Mink, PA-C  promethazine (PHENERGAN) 25 MG tablet Take 1 tablet (25 mg total) by mouth every 8 (eight) hours as needed for nausea or vomiting. 11/11/17   Quintella Reichert, MD  Ranitidine HCl (ZANTAC PO) Take 1 tablet by mouth 2 (two) times daily. Patient uses this medication for heartburn.  06/03/13  [provider]    Family History Family History  Problem Relation Age of Onset  . Diabetes Mother   . Thyroid disease Mother   . Depression Mother        with anxiety  . Stroke Mother   . Diabetes Father   . Hypertension Father   . Diabetes Sister   . Hypertension Sister   . Hypertension Brother     Social History Social History   Tobacco Use  . Smoking status: Current Every Day Smoker    Packs/day: 0.20    Years: 0.00    Pack years: 0.00    Types: Cigarettes  . Smokeless tobacco: Never Used  Substance Use Topics  . Alcohol use: No  . Drug use: No     Allergies   Contrast media [iodinated diagnostic agents]; Darvocet [propoxyphene n-acetaminophen]; Lisinopril; Metformin and related; Morphine and related; Percocet [oxycodone-acetaminophen]; Robaxin [methocarbamol]; Wellbutrin [bupropion hcl]; and Zofran [ondansetron hcl]   Review of Systems Review of Systems  Genitourinary: Positive  for flank pain.  All other systems reviewed and are negative.    Physical Exam Updated Vital Signs BP 133/67   Pulse 67   Temp 98.5 F (36.9 C) (Oral)   Resp 16   Ht 5\' 2"  (1.575 m)   Wt 111.1 kg (245 lb)   LMP 10/28/2017 (Exact Date) Comment: neg u preg in ED today   SpO2 95%   BMI 44.81 kg/m   Physical Exam  Constitutional: She is oriented to person, place, and time. She appears well-developed and well-nourished.  Uncomfortable appearing.   HENT:  Head: Normocephalic  and atraumatic.  Cardiovascular: Normal rate and regular rhythm.  No murmur heard. Pulmonary/Chest: Effort normal and breath sounds normal. No respiratory distress.  Abdominal: Soft. There is no rebound and no guarding.  Moderate LUQ tenderness to palpation  Musculoskeletal: She exhibits no edema or tenderness.  Neurological: She is alert and oriented to person, place, and time.  Skin: Skin is warm and dry.  Psychiatric: She has a normal mood and affect. Her behavior is normal.  Nursing note and vitals reviewed.    ED Treatments / Results  Labs (all labs ordered are listed, but only abnormal results are displayed) Labs Reviewed  URINALYSIS, ROUTINE W REFLEX MICROSCOPIC - Abnormal; Notable for the following components:      Result Value   Hgb urine dipstick TRACE (*)    All other components within normal limits  URINALYSIS, MICROSCOPIC (REFLEX) - Abnormal; Notable for the following components:   Bacteria, UA RARE (*)    Squamous Epithelial / LPF 0-5 (*)    All other components within normal limits  COMPREHENSIVE METABOLIC PANEL - Abnormal; Notable for the following components:   Glucose, Bld 146 (*)    All other components within normal limits  LIPASE, BLOOD - Abnormal; Notable for the following components:   Lipase 62 (*)    All other components within normal limits  CBC WITH DIFFERENTIAL/PLATELET - Abnormal; Notable for the following components:   WBC 13.2 (*)    Neutro Abs 9.3 (*)    All  other components within normal limits  PREGNANCY, URINE  TROPONIN I    EKG  EKG Interpretation  Date/Time:  Friday November 11 2017 08:39:59 EST Ventricular Rate:  68 PR Interval:    QRS Duration: 84 QT Interval:  420 QTC Calculation: 447 R Axis:   74 Text Interpretation:  Sinus rhythm Nonspecific T abnormalities, lateral leads Baseline wander in lead(s) I III aVR aVL V1 No significant change since last tracing Confirmed by Quintella Reichert [phone]) on 11/11/2017 8:52:41 AM       Radiology Ct Renal Stone Study  Result Date: 11/11/2017 CLINICAL DATA:  Left flank pain EXAM: CT ABDOMEN AND PELVIS WITHOUT CONTRAST TECHNIQUE: Multidetector CT imaging of the abdomen and pelvis was performed following the standard protocol without IV contrast. COMPARISON:  05/27/2017 FINDINGS: Lower chest: Lung bases are clear. No effusions. Heart is normal size. Hepatobiliary: No focal liver abnormality is seen. Status post cholecystectomy. No biliary dilatation. Pancreas: No focal abnormality or ductal dilatation. Spleen: No focal abnormality.  Normal size. Adrenals/Urinary Tract: 4-5 mm stone in the upper pole of the right kidney. No stones on the left. No hydronephrosis or ureteral stones bilaterally. Urinary bladder decompressed, grossly unremarkable. No adrenal mass. Stomach/Bowel: Few scattered colonic diverticula. Partial malrotation of the bowel with small bowel located in the right abdomen and colon located in the left abdomen with the cecum and ascending colon in the midline. Appendix is not definitively seen, but no pericecal inflammation. No bowel obstruction. Vascular/Lymphatic: Aorta and iliac vessels calcified. No evidence of aneurysm or adenopathy. Reproductive: Small exophytic fibroid off the anterior wall of the uterus, 2.2 cm. No adnexal mass. Other: No free fluid or free air. Small umbilical hernia containing fat. Musculoskeletal: No acute bony abnormality. IMPRESSION: Small right upper pole renal  stone. No ureteral stones or hydronephrosis bilaterally. Prior cholecystectomy. Small subserosal uterine fibroid anteriorly. Scattered colonic diverticula.  No active diverticulitis. Small umbilical hernia. Aortic atherosclerosis. No acute findings. Electronically Signed   By: Rolm Baptise M.D.  On: 11/11/2017 08:38    Procedures Procedures (including critical care time)  Medications Ordered in ED Medications  sodium chloride 0.9 % bolus 500 mL (0 mLs Intravenous Stopped 11/11/17 0956)  fentaNYL (SUBLIMAZE) injection 100 mcg (100 mcg Intravenous Given 11/11/17 0848)  promethazine (PHENERGAN) injection 25 mg (25 mg Intravenous Given 11/11/17 0857)     Initial Impression / Assessment and Plan / ED Course  I have reviewed the triage vital signs and the nursing notes.  Pertinent labs & imaging results that were available during my care of the patient were reviewed by me and considered in my medical decision making (see chart for details).    Pt here for evaluation of flank pain.  She is uncomfortable on initial exam, improved on recheck after treatment.  Presentation is not c/w ACS, PE.  No evidence of UTI, pancreatitis, obstructing stone, diverticulitis.  Discussed with patient home care for flank pain/abdominal pain unclear etiology. Discussed outpatient follow up and return precautions.    Final Clinical Impressions(s) / ED Diagnoses   Final diagnoses:  Left flank pain    ED Discharge Orders        Ordered    promethazine (PHENERGAN) 25 MG tablet  Every 8 hours PRN     11/11/17 0940       Quintella Reichert, MD 11/11/17 1650

## 2017-11-21 ENCOUNTER — Other Ambulatory Visit: Payer: Self-pay | Admitting: Physician Assistant

## 2017-11-21 DIAGNOSIS — N809 Endometriosis, unspecified: Secondary | ICD-10-CM

## 2017-11-21 DIAGNOSIS — S39012A Strain of muscle, fascia and tendon of lower back, initial encounter: Secondary | ICD-10-CM

## 2017-11-21 NOTE — Telephone Encounter (Signed)
Vicodin Last OV: 08/16/17 PCP: Gale Journey Pharmacy:  [id number] - [place], [place] - [address] [phone] (Phone) [phone] (Fax)

## 2017-11-21 NOTE — Telephone Encounter (Unsigned)
Copied from [place] [phone]. Topic: Quick Communication - Rx Refill/Question >> Nov 21, 2017  2:24 PM Percell Belt A wrote: Medication:  HYDROcodone-acetaminophen (NORCO/VICODIN) 5-325 MG tablet [[id number]] /  phentermine 37.5 MG capsule [[id number]]     Has the patient contacted their pharmacy?    (Agent: If no, request that the patient contact the pharmacy for the refill.)   Preferred [facility]with phone number [facility] [place] - pt would like to know when this called in to the pharmacy - pt has stuff going on with husband , he has liver cancer and can not get to office    Agent: Please be advised that RX refills may take up to 3 business days. We ask that you follow-up with your pharmacy.

## 2017-11-22 ENCOUNTER — Other Ambulatory Visit: Payer: Self-pay

## 2017-11-22 DIAGNOSIS — E119 Type 2 diabetes mellitus without complications: Secondary | ICD-10-CM

## 2017-11-22 MED ORDER — HYDROCODONE-ACETAMINOPHEN 5-325 MG PO TABS: 2.0000 | ORAL_TABLET | ORAL | 0 refills | Status: DC | PRN

## 2017-11-22 MED ORDER — NOVOTWIST 32G X 5 MM MISC: 3 refills | Status: DC

## 2017-11-22 NOTE — Telephone Encounter (Signed)
Done.  Please call and make the patient and appt with me.

## 2017-11-23 ENCOUNTER — Other Ambulatory Visit: Payer: Self-pay | Admitting: Physician Assistant

## 2018-01-09 ENCOUNTER — Other Ambulatory Visit: Payer: Self-pay | Admitting: Physician Assistant

## 2018-01-09 DIAGNOSIS — R600 Localized edema: Secondary | ICD-10-CM

## 2018-01-10 ENCOUNTER — Other Ambulatory Visit: Payer: Self-pay | Admitting: Physician Assistant

## 2018-01-10 DIAGNOSIS — N809 Endometriosis, unspecified: Secondary | ICD-10-CM

## 2018-01-10 NOTE — Telephone Encounter (Signed)
Refill of Norco  LOV 08/16/17  W.McVey  [id number] - [place], [place] - [address]

## 2018-01-10 NOTE — Telephone Encounter (Signed)
Patient is requesting a refill of the following medications: Requested Prescriptions   Pending Prescriptions Disp Refills  . HYDROcodone-acetaminophen (NORCO/VICODIN) 5-325 MG tablet 20 tablet 0    Sig: Take 2 tablets by mouth every 4 (four) hours as needed.    Date of patient request: 01/10/2018 Last office visit: 08/16/2017 Date of last refill: 11/22/2017 Last refill amount: 20 Follow up time period per chart:

## 2018-01-10 NOTE — Telephone Encounter (Signed)
Copied from [place]. Topic: General - Other >> Jan 10, 2018 12:39 PM Darl Householder, RMA wrote: Reason for CRM: Medication refill request for HYDROcodone-acetaminophen (NORCO/VICODIN) 5-325 MG tablet to be [facility] to [facility]

## 2018-01-11 DIAGNOSIS — J3089 Other allergic rhinitis: Secondary | ICD-10-CM | POA: Diagnosis not present

## 2018-01-12 MED ORDER — HYDROCODONE-ACETAMINOPHEN 5-325 MG PO TABS: 2.0000 | ORAL_TABLET | ORAL | 0 refills | Status: DC | PRN

## 2018-01-12 NOTE — Telephone Encounter (Signed)
Please call the patient and schedule an appt with me please

## 2018-01-22 ENCOUNTER — Other Ambulatory Visit: Payer: Self-pay | Admitting: Physician Assistant

## 2018-01-22 DIAGNOSIS — I10 Essential (primary) hypertension: Secondary | ICD-10-CM

## 2018-02-02 ENCOUNTER — Other Ambulatory Visit: Payer: Self-pay | Admitting: Physician Assistant

## 2018-02-02 DIAGNOSIS — S39012A Strain of muscle, fascia and tendon of lower back, initial encounter: Secondary | ICD-10-CM

## 2018-02-02 NOTE — Telephone Encounter (Signed)
Celecoxib (Celebrex) refill Last OV: 07/01/17 Last Refill:11/22/17 #30 tabs [facility] [address] PCP: Windell Hummingbird PA-C

## 2018-02-18 ENCOUNTER — Other Ambulatory Visit: Payer: Self-pay | Admitting: Physician Assistant

## 2018-02-18 DIAGNOSIS — E7849 Other hyperlipidemia: Secondary | ICD-10-CM

## 2018-02-19 ENCOUNTER — Other Ambulatory Visit: Payer: Self-pay | Admitting: Physician Assistant

## 2018-02-19 DIAGNOSIS — I10 Essential (primary) hypertension: Secondary | ICD-10-CM

## 2018-02-21 ENCOUNTER — Observation Stay (HOSPITAL_BASED_OUTPATIENT_CLINIC_OR_DEPARTMENT_OTHER): Payer: BLUE CROSS/BLUE SHIELD

## 2018-02-21 ENCOUNTER — Encounter (HOSPITAL_BASED_OUTPATIENT_CLINIC_OR_DEPARTMENT_OTHER): Payer: Self-pay | Admitting: Emergency Medicine

## 2018-02-21 ENCOUNTER — Emergency Department (HOSPITAL_BASED_OUTPATIENT_CLINIC_OR_DEPARTMENT_OTHER): Payer: BLUE CROSS/BLUE SHIELD

## 2018-02-21 ENCOUNTER — Inpatient Hospital Stay (HOSPITAL_BASED_OUTPATIENT_CLINIC_OR_DEPARTMENT_OTHER)
Admission: EM | Admit: 2018-02-21 | Discharge: 2018-02-28 | DRG: 871 | Disposition: A | Discharge status: Home / Self Care | Payer: BLUE CROSS/BLUE SHIELD | Attending: Internal Medicine | Admitting: Internal Medicine

## 2018-02-21 ENCOUNTER — Other Ambulatory Visit: Payer: Self-pay

## 2018-02-21 DIAGNOSIS — R0902 Hypoxemia: Secondary | ICD-10-CM

## 2018-02-21 DIAGNOSIS — Z833 Family history of diabetes mellitus: Secondary | ICD-10-CM | POA: Diagnosis not present

## 2018-02-21 DIAGNOSIS — J9601 Acute respiratory failure with hypoxia: Secondary | ICD-10-CM | POA: Diagnosis not present

## 2018-02-21 DIAGNOSIS — Z6841 Body Mass Index (BMI) 40.0 and over, adult: Secondary | ICD-10-CM

## 2018-02-21 DIAGNOSIS — E872 Acidosis: Secondary | ICD-10-CM | POA: Diagnosis present

## 2018-02-21 DIAGNOSIS — F411 Generalized anxiety disorder: Secondary | ICD-10-CM | POA: Diagnosis present

## 2018-02-21 DIAGNOSIS — Z9989 Dependence on other enabling machines and devices: Secondary | ICD-10-CM | POA: Diagnosis not present

## 2018-02-21 DIAGNOSIS — J209 Acute bronchitis, unspecified: Secondary | ICD-10-CM | POA: Diagnosis not present

## 2018-02-21 DIAGNOSIS — Z8249 Family history of ischemic heart disease and other diseases of the circulatory system: Secondary | ICD-10-CM

## 2018-02-21 DIAGNOSIS — G4733 Obstructive sleep apnea (adult) (pediatric): Secondary | ICD-10-CM | POA: Diagnosis not present

## 2018-02-21 DIAGNOSIS — R062 Wheezing: Secondary | ICD-10-CM

## 2018-02-21 DIAGNOSIS — E876 Hypokalemia: Secondary | ICD-10-CM | POA: Diagnosis present

## 2018-02-21 DIAGNOSIS — F17210 Nicotine dependence, cigarettes, uncomplicated: Secondary | ICD-10-CM | POA: Diagnosis present

## 2018-02-21 DIAGNOSIS — R0609 Other forms of dyspnea: Secondary | ICD-10-CM | POA: Diagnosis not present

## 2018-02-21 DIAGNOSIS — J189 Pneumonia, unspecified organism: Secondary | ICD-10-CM | POA: Diagnosis not present

## 2018-02-21 DIAGNOSIS — E1165 Type 2 diabetes mellitus with hyperglycemia: Secondary | ICD-10-CM | POA: Diagnosis present

## 2018-02-21 DIAGNOSIS — E785 Hyperlipidemia, unspecified: Secondary | ICD-10-CM | POA: Diagnosis not present

## 2018-02-21 DIAGNOSIS — T380X5A Adverse effect of glucocorticoids and synthetic analogues, initial encounter: Secondary | ICD-10-CM | POA: Diagnosis present

## 2018-02-21 DIAGNOSIS — E1169 Type 2 diabetes mellitus with other specified complication: Secondary | ICD-10-CM | POA: Diagnosis not present

## 2018-02-21 DIAGNOSIS — F329 Major depressive disorder, single episode, unspecified: Secondary | ICD-10-CM | POA: Diagnosis present

## 2018-02-21 DIAGNOSIS — Z794 Long term (current) use of insulin: Secondary | ICD-10-CM

## 2018-02-21 DIAGNOSIS — J40 Bronchitis, not specified as acute or chronic: Secondary | ICD-10-CM | POA: Diagnosis not present

## 2018-02-21 DIAGNOSIS — I110 Hypertensive heart disease with heart failure: Secondary | ICD-10-CM | POA: Diagnosis present

## 2018-02-21 DIAGNOSIS — I5032 Chronic diastolic (congestive) heart failure: Secondary | ICD-10-CM | POA: Diagnosis present

## 2018-02-21 DIAGNOSIS — A419 Sepsis, unspecified organism: Secondary | ICD-10-CM | POA: Diagnosis not present

## 2018-02-21 DIAGNOSIS — J440 Chronic obstructive pulmonary disease with acute lower respiratory infection: Secondary | ICD-10-CM | POA: Diagnosis present

## 2018-02-21 DIAGNOSIS — R0602 Shortness of breath: Secondary | ICD-10-CM | POA: Diagnosis not present

## 2018-02-21 DIAGNOSIS — Z9049 Acquired absence of other specified parts of digestive tract: Secondary | ICD-10-CM | POA: Diagnosis not present

## 2018-02-21 DIAGNOSIS — J441 Chronic obstructive pulmonary disease with (acute) exacerbation: Secondary | ICD-10-CM | POA: Diagnosis present

## 2018-02-21 DIAGNOSIS — E119 Type 2 diabetes mellitus without complications: Secondary | ICD-10-CM | POA: Diagnosis not present

## 2018-02-21 LAB — CBC WITH DIFFERENTIAL/PLATELET
Basophils Absolute: 0.1 10*3/uL (ref 0.0–0.1)
Basophils Relative: 0 %
EOS ABS: 0.2 10*3/uL (ref 0.0–0.7)
EOS PCT: 2 %
HCT: 37.6 % (ref 36.0–46.0)
HEMOGLOBIN: 13.4 g/dL (ref 12.0–15.0)
LYMPHS ABS: 3.6 10*3/uL (ref 0.7–4.0)
Lymphocytes Relative: 26 %
MCH: 31.6 pg (ref 26.0–34.0)
MCHC: 35.6 g/dL (ref 30.0–36.0)
MCV: 88.7 fL (ref 78.0–100.0)
MONOS PCT: 6 %
Monocytes Absolute: 0.8 10*3/uL (ref 0.1–1.0)
Neutro Abs: 9.3 10*3/uL — ABNORMAL HIGH (ref 1.7–7.7)
Neutrophils Relative %: 66 %
PLATELETS: 257 10*3/uL (ref 150–400)
RBC: 4.24 MIL/uL (ref 3.87–5.11)
RDW: 14.3 % (ref 11.5–15.5)
WBC: 14 10*3/uL — ABNORMAL HIGH (ref 4.0–10.5)

## 2018-02-21 LAB — GLUCOSE, CAPILLARY: Glucose-Capillary: 563 mg/dL (ref 65–99)

## 2018-02-21 LAB — BASIC METABOLIC PANEL
Anion gap: 14 (ref 5–15)
BUN: 9 mg/dL (ref 6–20)
CHLORIDE: 101 mmol/L (ref 101–111)
CO2: 20 mmol/L — AB (ref 22–32)
CREATININE: 0.78 mg/dL (ref 0.44–1.00)
Calcium: 8.4 mg/dL — ABNORMAL LOW (ref 8.9–10.3)
GFR calc Af Amer: >60 mL/min (ref >60)
GFR calc non Af Amer: >60 mL/min (ref >60)
Glucose, Bld: 360 mg/dL — ABNORMAL HIGH (ref 65–99)
Potassium: 3 mmol/L — ABNORMAL LOW (ref 3.5–5.1)
Sodium: 135 mmol/L (ref 135–145)

## 2018-02-21 LAB — BLOOD GAS, ARTERIAL
ACID-BASE DEFICIT: 5.3 mmol/L — AB (ref 0.0–2.0)
Bicarbonate: 18.7 mmol/L — ABNORMAL LOW (ref 20.0–28.0)
Drawn by: 519031
O2 CONTENT: 3 L/min
O2 SAT: 95.5 %
PATIENT TEMPERATURE: 98.6
PCO2 ART: 30.9 mmHg — AB (ref 32.0–48.0)
pH, Arterial: 7.398 (ref 7.350–7.450)
pO2, Arterial: 78.8 mmHg — ABNORMAL LOW (ref 83.0–108.0)

## 2018-02-21 LAB — D-DIMER, QUANTITATIVE: D-Dimer, Quant: 0.63 ug/mL-FEU — ABNORMAL HIGH (ref 0.00–0.50)

## 2018-02-21 LAB — I-STAT CG4 LACTIC ACID, ED
LACTIC ACID, VENOUS: 7.05 mmol/L — AB (ref 0.5–1.9)
Lactic Acid, Venous: 2.43 mmol/L (ref 0.5–1.9)
Lactic Acid, Venous: 5.58 mmol/L (ref 0.5–1.9)

## 2018-02-21 LAB — TROPONIN I

## 2018-02-21 LAB — PROCALCITONIN: PROCALCITONIN: 0.32 ng/mL

## 2018-02-21 LAB — INFLUENZA PANEL BY PCR (TYPE A & B)
Influenza A By PCR: NEGATIVE
Influenza B By PCR: NEGATIVE

## 2018-02-21 MED ORDER — AZITHROMYCIN 500 MG IV SOLR
INTRAVENOUS | Status: AC
  Filled 2018-02-21: qty 500

## 2018-02-21 MED ORDER — OSELTAMIVIR PHOSPHATE 75 MG PO CAPS
75.0000 mg | ORAL_CAPSULE | Freq: Once | ORAL | Status: AC
  Administered 2018-02-21: 75 mg via ORAL
  Filled 2018-02-21: qty 1

## 2018-02-21 MED ORDER — INSULIN ASPART 100 UNIT/ML ~~LOC~~ SOLN
0.0000 [IU] | Freq: Every day | SUBCUTANEOUS | Status: DC
  Administered 2018-02-22: 5 [IU] via SUBCUTANEOUS
  Administered 2018-02-23: 4 [IU] via SUBCUTANEOUS
  Administered 2018-02-25: 5 [IU] via SUBCUTANEOUS

## 2018-02-21 MED ORDER — CELECOXIB 200 MG PO CAPS
200.0000 mg | ORAL_CAPSULE | Freq: Every day | ORAL | Status: DC
  Administered 2018-02-21 – 2018-02-28 (×8): 200 mg via ORAL
  Filled 2018-02-21 (×9): qty 1

## 2018-02-21 MED ORDER — IPRATROPIUM-ALBUTEROL 0.5-2.5 (3) MG/3ML IN SOLN
3.0000 mL | Freq: Four times a day (QID) | RESPIRATORY_TRACT | Status: DC
  Administered 2018-02-21 (×2): 3 mL via RESPIRATORY_TRACT
  Filled 2018-02-21: qty 3

## 2018-02-21 MED ORDER — LORAZEPAM 2 MG/ML IJ SOLN
0.5000 mg | Freq: Every day | INTRAMUSCULAR | Status: DC | PRN
  Administered 2018-02-21 – 2018-02-28 (×8): 0.5 mg via INTRAVENOUS
  Filled 2018-02-21 (×8): qty 1

## 2018-02-21 MED ORDER — SODIUM CHLORIDE 0.9 % IV BOLUS
1000.0000 mL | Freq: Once | INTRAVENOUS | Status: AC
  Administered 2018-02-21: 1000 mL via INTRAVENOUS

## 2018-02-21 MED ORDER — SODIUM CHLORIDE 0.9 % IV BOLUS: 1000.0000 mL | Freq: Once | INTRAVENOUS | Status: DC

## 2018-02-21 MED ORDER — IOPAMIDOL (ISOVUE-370) INJECTION 76%
100.0000 mL | Freq: Once | INTRAVENOUS | Status: AC | PRN
  Administered 2018-02-21: 68 mL via INTRAVENOUS

## 2018-02-21 MED ORDER — NICOTINE 14 MG/24HR TD PT24: 14.0000 mg | MEDICATED_PATCH | Freq: Every day | TRANSDERMAL | Status: DC | PRN

## 2018-02-21 MED ORDER — DIAZEPAM 2 MG PO TABS
1.0000 mg | ORAL_TABLET | Freq: Two times a day (BID) | ORAL | Status: DC | PRN
  Administered 2018-02-22 – 2018-02-25 (×5): 2 mg via ORAL
  Filled 2018-02-21 (×5): qty 1

## 2018-02-21 MED ORDER — AZITHROMYCIN 250 MG PO TABS: 1000.0000 mg | ORAL_TABLET | Freq: Once | ORAL | Status: DC

## 2018-02-21 MED ORDER — INSULIN ASPART 100 UNIT/ML ~~LOC~~ SOLN
0.0000 [IU] | Freq: Three times a day (TID) | SUBCUTANEOUS | Status: DC
  Administered 2018-02-22: 5 [IU] via SUBCUTANEOUS
  Administered 2018-02-22 – 2018-02-23 (×2): 11 [IU] via SUBCUTANEOUS
  Administered 2018-02-23: 15 [IU] via SUBCUTANEOUS
  Administered 2018-02-23 – 2018-02-24 (×3): 11 [IU] via SUBCUTANEOUS
  Administered 2018-02-25: 15 [IU] via SUBCUTANEOUS
  Administered 2018-02-25: 8 [IU] via SUBCUTANEOUS
  Administered 2018-02-25: 5 [IU] via SUBCUTANEOUS
  Administered 2018-02-26: 3 [IU] via SUBCUTANEOUS
  Administered 2018-02-26: 2 [IU] via SUBCUTANEOUS

## 2018-02-21 MED ORDER — SODIUM CHLORIDE 0.9 % IV SOLN: 1.0000 g | Freq: Once | INTRAVENOUS | Status: DC

## 2018-02-21 MED ORDER — SODIUM CHLORIDE 3 % IN NEBU: 4.0000 mL | INHALATION_SOLUTION | Freq: Three times a day (TID) | RESPIRATORY_TRACT | Status: DC

## 2018-02-21 MED ORDER — FLUOXETINE HCL 20 MG PO CAPS
20.0000 mg | ORAL_CAPSULE | Freq: Two times a day (BID) | ORAL | Status: DC
  Administered 2018-02-21 – 2018-02-28 (×14): 20 mg via ORAL
  Filled 2018-02-21 (×14): qty 1

## 2018-02-21 MED ORDER — ENOXAPARIN SODIUM 40 MG/0.4ML ~~LOC~~ SOLN
40.0000 mg | SUBCUTANEOUS | Status: DC
  Administered 2018-02-21 – 2018-02-27 (×7): 40 mg via SUBCUTANEOUS
  Filled 2018-02-21 (×7): qty 0.4

## 2018-02-21 MED ORDER — FUROSEMIDE 20 MG PO TABS
20.0000 mg | ORAL_TABLET | Freq: Every day | ORAL | Status: DC
  Administered 2018-02-21 – 2018-02-22 (×2): 20 mg via ORAL
  Filled 2018-02-21 (×2): qty 1

## 2018-02-21 MED ORDER — SODIUM CHLORIDE 0.9 % IV SOLN
500.0000 mg | INTRAVENOUS | Status: DC
  Administered 2018-02-21 – 2018-02-22 (×2): 500 mg via INTRAVENOUS
  Filled 2018-02-21 (×2): qty 500

## 2018-02-21 MED ORDER — GUAIFENESIN ER 600 MG PO TB12
1200.0000 mg | ORAL_TABLET | Freq: Two times a day (BID) | ORAL | Status: DC
  Administered 2018-02-21 – 2018-02-28 (×14): 1200 mg via ORAL
  Filled 2018-02-21 (×14): qty 2

## 2018-02-21 MED ORDER — ALBUTEROL SULFATE (2.5 MG/3ML) 0.083% IN NEBU
2.5000 mg | INHALATION_SOLUTION | RESPIRATORY_TRACT | Status: DC | PRN
Start: 2018-02-21 — End: 2018-02-28

## 2018-02-21 MED ORDER — ALBUTEROL SULFATE (2.5 MG/3ML) 0.083% IN NEBU
INHALATION_SOLUTION | RESPIRATORY_TRACT | Status: AC
  Administered 2018-02-21: 2.5 mg
  Filled 2018-02-21: qty 3

## 2018-02-21 MED ORDER — IPRATROPIUM-ALBUTEROL 0.5-2.5 (3) MG/3ML IN SOLN
RESPIRATORY_TRACT | Status: AC
  Administered 2018-02-21: 3 mL via RESPIRATORY_TRACT
  Filled 2018-02-21: qty 3

## 2018-02-21 MED ORDER — ACETAMINOPHEN 500 MG PO TABS: 1000.0000 mg | ORAL_TABLET | Freq: Once | ORAL | Status: DC

## 2018-02-21 MED ORDER — LORAZEPAM 2 MG/ML IJ SOLN
0.5000 mg | Freq: Once | INTRAMUSCULAR | Status: AC
  Administered 2018-02-21: 0.5 mg via INTRAVENOUS
  Filled 2018-02-21: qty 1

## 2018-02-21 MED ORDER — SODIUM CHLORIDE 0.9 % IV SOLN
2.0000 g | INTRAVENOUS | Status: DC
  Administered 2018-02-21 – 2018-02-22 (×2): 2 g via INTRAVENOUS
  Filled 2018-02-21 (×2): qty 20

## 2018-02-21 MED ORDER — LORAZEPAM 2 MG/ML IJ SOLN
1.0000 mg | Freq: Once | INTRAMUSCULAR | Status: AC
  Administered 2018-02-21: 1 mg via INTRAVENOUS
  Filled 2018-02-21: qty 1

## 2018-02-21 MED ORDER — ALBUTEROL SULFATE (2.5 MG/3ML) 0.083% IN NEBU
5.0000 mg | INHALATION_SOLUTION | Freq: Once | RESPIRATORY_TRACT | Status: AC
  Administered 2018-02-21: 5 mg via RESPIRATORY_TRACT
  Filled 2018-02-21: qty 6

## 2018-02-21 MED ORDER — METHYLPREDNISOLONE SODIUM SUCC 125 MG IJ SOLR
125.0000 mg | Freq: Once | INTRAMUSCULAR | Status: AC
  Administered 2018-02-21: 125 mg via INTRAVENOUS
  Filled 2018-02-21: qty 2

## 2018-02-21 MED ORDER — ALBUTEROL SULFATE (2.5 MG/3ML) 0.083% IN NEBU: 2.5000 mg | INHALATION_SOLUTION | Freq: Four times a day (QID) | RESPIRATORY_TRACT | Status: DC

## 2018-02-21 MED ORDER — MAGNESIUM SULFATE 2 GM/50ML IV SOLN
2.0000 g | Freq: Once | INTRAVENOUS | Status: AC
  Administered 2018-02-21: 2 g via INTRAVENOUS
  Filled 2018-02-21 (×2): qty 50

## 2018-02-21 MED ORDER — DIPHENHYDRAMINE HCL 50 MG/ML IJ SOLN
50.0000 mg | Freq: Once | INTRAMUSCULAR | Status: AC
  Administered 2018-02-21: 50 mg via INTRAVENOUS
  Filled 2018-02-21: qty 1

## 2018-02-21 MED ORDER — INSULIN ASPART 100 UNIT/ML ~~LOC~~ SOLN
25.0000 [IU] | Freq: Once | SUBCUTANEOUS | Status: AC
  Administered 2018-02-21: 25 [IU] via SUBCUTANEOUS

## 2018-02-21 MED ORDER — METHYLPREDNISOLONE SODIUM SUCC 125 MG IJ SOLR
60.0000 mg | Freq: Three times a day (TID) | INTRAMUSCULAR | Status: DC
  Administered 2018-02-21 – 2018-02-22 (×3): 60 mg via INTRAVENOUS
  Filled 2018-02-21 (×3): qty 2

## 2018-02-21 MED ORDER — POTASSIUM CHLORIDE CRYS ER 20 MEQ PO TBCR
40.0000 meq | EXTENDED_RELEASE_TABLET | Freq: Three times a day (TID) | ORAL | Status: AC
  Administered 2018-02-21 – 2018-02-22 (×3): 40 meq via ORAL
  Filled 2018-02-21 (×3): qty 2

## 2018-02-21 MED ORDER — IPRATROPIUM-ALBUTEROL 0.5-2.5 (3) MG/3ML IN SOLN: 3.0000 mL | Freq: Four times a day (QID) | RESPIRATORY_TRACT | Status: DC

## 2018-02-21 MED ORDER — SODIUM CHLORIDE 3 % IN NEBU
4.0000 mL | INHALATION_SOLUTION | Freq: Three times a day (TID) | RESPIRATORY_TRACT | Status: DC
  Administered 2018-02-21 – 2018-02-23 (×6): 4 mL via RESPIRATORY_TRACT
  Filled 2018-02-21 (×10): qty 4

## 2018-02-21 MED ORDER — ACETAMINOPHEN 500 MG PO TABS
1000.0000 mg | ORAL_TABLET | Freq: Once | ORAL | Status: AC
  Administered 2018-02-21: 1000 mg via ORAL
  Filled 2018-02-21: qty 2

## 2018-02-21 MED ORDER — LORAZEPAM 2 MG/ML IJ SOLN: 0.5000 mg | Freq: Four times a day (QID) | INTRAMUSCULAR | Status: DC | PRN

## 2018-02-21 MED ORDER — IPRATROPIUM-ALBUTEROL 0.5-2.5 (3) MG/3ML IN SOLN
3.0000 mL | Freq: Three times a day (TID) | RESPIRATORY_TRACT | Status: DC
  Administered 2018-02-22 – 2018-02-24 (×6): 3 mL via RESPIRATORY_TRACT
  Filled 2018-02-21 (×6): qty 3
  Filled 2018-02-21: qty 39

## 2018-02-21 MED ORDER — GUAIFENESIN-DM 100-10 MG/5ML PO SYRP: 5.0000 mL | ORAL_SOLUTION | ORAL | Status: DC | PRN

## 2018-02-21 MED ORDER — ATORVASTATIN CALCIUM 40 MG PO TABS
40.0000 mg | ORAL_TABLET | Freq: Every day | ORAL | Status: DC
  Administered 2018-02-21 – 2018-02-28 (×8): 40 mg via ORAL
  Filled 2018-02-21 (×8): qty 1

## 2018-02-21 NOTE — ED Notes (Signed)
Pt received a total of 3L NS; infusion given at bolus rate.

## 2018-02-21 NOTE — ED Notes (Signed)
PT provided snack per [place] by EDP

## 2018-02-21 NOTE — ED Notes (Signed)
Pt called out and said she was having trouble breathing; HR 145, resp 40, O2 sat 95% on 3L [place]; EDP and RT to bedside; pt appears anxious and was encouraged to slow breathing. Pt requests to take one of her "Valiums"; EDP will order med for anxiety.

## 2018-02-21 NOTE — ED Notes (Signed)
Report to [place], Therapist, sports with [facility]

## 2018-02-21 NOTE — H&P (Signed)
History and Physical  [name] LEX:[id number] DOB: 05/04/1966 DOA: 02/21/2018  Referring physician: Dr  Waldron Labs PCP: Mancel Bale, PA-C  Outpatient Specialists: Cardiology Patient coming from: Home  Chief Complaint: Shortness of breath and cough  HPI: [name] is a 52 y.o. female with medical history significant for COPD, OSA compliant with CPAP, morbid obesity, type 2 diabetes, generalized anxiety, chronic diastolic CHF, who presented to [facility] with complaints of shortness of breath of 5 days duration.  Patient reports symptoms have been gradually worsening.  Associated with productive cough with yellowish sputum, intermittent moderate chest tightness, and dyspnea on exertion.  No sick contacts.  Current smoker 30+-year.  Admits to doing outside work and exposure to pollen.  Endorses night sweats and chills.  Reports compliance with medications and CPAP.  ED Course: On presentation hypoxic with O2 saturation in the 80s on room air, tachycardic and tachypneic.  CTA unremarkable for acute PE.  Chest x-ray and CT chest personally reviewed revealed bilateral pulmonary interstitial infiltrates.  Lab studies remarkable for lactic acidosis with a lactic acid of 7, leukocytosis with WBC 14.  Hypokalemia.  EKG with sinus tach rate of 117 and ventricular bigeminy.  Patient admitted for sepsis secondary to CAP bacterial versus viral, present on admission.  Promptly started on IV antibiotics IV ceftriaxone and IV azithromycin.  Review of Systems: Review of systems as noted in the HPI.  All other systems reviewed and are negative.     Past Medical History:  Diagnosis Date  . Anxiety   . Arthritis   . Diabetes mellitus   . Endometriosis   . Hypersomnia, persistent    epworth 16   . Hypertension   . Lyme borreliosis   . Migraine   . Morbid obesity ([place])   . Obesity, Class III, BMI 40-49.9 (morbid obesity) ([place])   . Obstructive sleep apnea    CPAP NIGHTLY; sleep study 2007.  Marland Kitchen  Renal disorder    kidney stone   Past Surgical History:  Procedure Laterality Date  . ABLATION ON ENDOMETRIOSIS    . ABLATION ON ENDOMETRIOSIS     10 years ago  . CARDIAC CATHETERIZATION N/A 02/20/2016   Procedure: Left Heart Cath and Coronary Angiography;  Surgeon: [name], MD;  Location: [facility];  Service: Cardiovascular;  Laterality: N/A;  . CHOLECYSTECTOMY    . KNEE ARTHROCENTESIS    . KNEE SURGERY    . TUBAL LIGATION      Social History:  reports that she has been smoking cigarettes.  She has been smoking about 0.20 packs per day for the past 0.00 years. She has never used smokeless tobacco. She reports that she does not drink alcohol or use drugs.   Allergies  Allergen Reactions  . Contrast Media [Iodinated Diagnostic Agents] Hives  . Darvocet [Propoxyphene N-Acetaminophen] Hives and Itching  . Lisinopril Cough  . Metformin And Related Other (See Comments)    Chest pain  . Morphine And Related Nausea And Vomiting  . Percocet [Oxycodone-Acetaminophen] Itching    And headache  . Robaxin [Methocarbamol]     Headache   . Wellbutrin [Bupropion Hcl]     Hears voices  . Zofran [Ondansetron Hcl] Itching    Family History  Problem Relation Age of Onset  . Diabetes Mother   . Thyroid disease Mother   . Depression Mother        with anxiety  . Stroke Mother   . Diabetes Father   . Hypertension Father   .  Diabetes Sister   . Hypertension Sister   . Hypertension Brother     Sister with heart disease in her 46s. Mother with heart disease.  Prior to Admission medications   Medication Sig Start Date End Date Taking? Authorizing Provider  atorvastatin (LIPITOR) 40 MG tablet Take 1 tablet (40 mg total) by mouth daily. 08/19/17   McVey, Gelene Mink, PA-C  celecoxib (CELEBREX) 200 MG capsule TAKE 1 CAPSULE (200 MG TOTAL) BY MOUTH DAILY. OFFICE VISIT NEEDED 02/02/18   Gale Journey, Damaris Hippo, PA-C  cyclobenzaprine (FLEXERIL) 5 MG tablet TAKE 1 TABLET (5 MG TOTAL)  BY MOUTH 3 (THREE) TIMES DAILY AS NEEDED FOR MUSCLE SPASMS. 06/01/16   Gale Journey, Damaris Hippo, PA-C  diazepam (VALIUM) 2 MG tablet Take 0.5-1 tablets (1-2 mg total) every 12 (twelve) hours as needed by mouth for anxiety or muscle spasms. 08/16/17   McVey, Gelene Mink, PA-C  dicyclomine (BENTYL) 20 MG tablet Take 1 tablet (20 mg total) by mouth 2 (two) times daily. 05/27/17   Tanna Furry, MD  FLUoxetine (PROZAC) 20 MG capsule Take 1 capsule (20 mg total) by mouth 2 (two) times daily. 07/01/17   Weber, Damaris Hippo, PA-C  FLUoxetine (PROZAC) 20 MG capsule TAKE 1 CAPSULE (20 MG TOTAL) BY MOUTH 2 (TWO) TIMES DAILY. 07/21/17   Weber, Damaris Hippo, PA-C  furosemide (LASIX) 20 MG tablet TAKE 1/2 TO 1 TABLET BY MOUTH EVERY DAY 01/09/18   Weber, Damaris Hippo, PA-C  gemfibrozil (LOPID) 600 MG tablet TAKE 1 TABLET BY MOUTH TWICE A DAY BEFORE A MEAL 02/21/18   Weber, Sarah L, PA-C  glucose blood test strip Use as instructed 06/01/16   Weber, Damaris Hippo, PA-C  HYDROcodone-acetaminophen (NORCO/VICODIN) 5-325 MG tablet Take 2 tablets by mouth every 4 (four) hours as needed. 01/12/18   Weber, Damaris Hippo, PA-C  Insulin Glargine (TOUJEO SOLOSTAR) 300 UNIT/ML SOPN Inject 80 Units daily into the skin. 08/16/17   McVey, Gelene Mink, PA-C  liraglutide (VICTOZA) 18 MG/3ML SOPN INJECT 0.3MLS SUBCUTANEOUSLY DAILY 08/16/17   McVey, Gelene Mink, PA-C  Lorcaserin HCl 10 MG TABS Take 10 mg by mouth daily. 11/13/16   Gale Journey, Damaris Hippo, PA-C  metoprolol tartrate (LOPRESSOR) 50 MG tablet Take 1 tablet (50 mg total) by mouth 2 (two) times daily. Ov needed 02/21/18   Weber, Sarah L, PA-C  NOVOTWIST 32G X 5 MM MISC USE 1 APPLICATION BY DOES NOT APPLY ROUTE DAILY. 11/22/17   Gale Journey, Damaris Hippo, PA-C  phentermine 37.5 MG capsule Take 1 capsule (37.5 mg total) every morning by mouth. 08/16/17   McVey, Gelene Mink, PA-C  promethazine (PHENERGAN) 25 MG tablet Take 1 tablet (25 mg total) by mouth every 8 (eight) hours as needed for nausea or vomiting. 11/11/17    Quintella Reichert, MD  Ranitidine HCl (ZANTAC PO) Take 1 tablet by mouth 2 (two) times daily. Patient uses this medication for heartburn.  06/03/13  [provider]    Physical Exam: BP (!) 142/72   Pulse (!) 113   Temp 98.8 F (37.1 C) (Oral)   Resp 20   Ht '5\' 2"'$  (1.575 m)   Wt 114.2 kg (251 lb 12.2 oz)   LMP 02/09/2018   SpO2 95%   BMI 46.05 kg/m   . General: 52 y.o. year-old female morbidly obese, mildly uncomfortable due to dyspnea.  Alert and oriented x3. . Cardiovascular: Regular rate and rhythm with no rubs or gallops.  No thyromegaly or JVD noted.   Marland Kitchen Respiratory: Diffuse wheezes and rales bilaterally.  Good inspiratory effort. . Abdomen: Soft nontender nondistended with normal bowel sounds x4 quadrants. . Musculoskeletal: No lower extremity edema. 2/4 pulses in all 4 extremities. . Skin: No ulcerative lesions noted or rashes, . Psychiatry: Mood is appropriate for condition and setting          Labs on Admission:  Basic Metabolic Panel: Recent Labs  Lab 02/21/18 1111  NA 135  K 3.0*  CL 101  CO2 20*  GLUCOSE 360*  BUN 9  CREATININE 0.78  CALCIUM 8.4*   Liver Function Tests: No results for input(s): AST, ALT, ALKPHOS, BILITOT, PROT, ALBUMIN in the last 168 hours. No results for input(s): LIPASE, AMYLASE in the last 168 hours. No results for input(s): AMMONIA in the last 168 hours. CBC: Recent Labs  Lab 02/21/18 1111  WBC 14.0*  NEUTROABS 9.3*  HGB 13.4  HCT 37.6  MCV 88.7  PLT 257   Cardiac Enzymes: Recent Labs  Lab 02/21/18 1111  TROPONINI <0.03    BNP (last 3 results) No results for input(s): BNP in the last 8760 hours.  ProBNP (last 3 results) No results for input(s): PROBNP in the last 8760 hours.  CBG: No results for input(s): GLUCAP in the last 168 hours.  Radiological Exams on Admission: Ct Angio Chest Pe W/cm &/or Wo Cm  Result Date: 02/21/2018 CLINICAL DATA:  Shortness of breath on oxygen. Suspect pulmonary embolism.  History of hypertension, cardiac catheterization. EXAM: CT ANGIOGRAPHY CHEST WITH CONTRAST TECHNIQUE: Multidetector CT imaging of the chest was performed using the standard protocol during bolus administration of intravenous contrast. Multiplanar CT image reconstructions and MIPs were obtained to evaluate the vascular anatomy. CONTRAST:  22m ISOVUE-370 IOPAMIDOL (ISOVUE-370) INJECTION 76% COMPARISON:  Chest radiograph Feb 21, 2018 FINDINGS: CARDIOVASCULAR: Suboptimal contrast opacification of the pulmonary artery's (150 Hounsfield units, target is 250 Hounsfield units). Main pulmonary artery is not enlarged. No pulmonary arterial filling defects to the level of the segmental branches. Heart size is normal, no right heart strain. No pericardial effusion. Thoracic aorta is normal course and caliber, mild calcific atherosclerosis. MEDIASTINUM/NODES: Borderline mediastinal lymphadenopathy attributable to parenchymal process, no routine indicated follow-up (This recommendation follows ACR consensus guidelines: Managing Incidental Findings on Thoracic CT: Mediastinal and Cardiovascular Findings. A White Paper of the ACR Incidental Findings Committee. J Am Coll Radiol. 2018; 15:: [id number]. LUNGS/PLEURA: Tracheobronchial tree is patent, no pneumothorax. Mild bronchial wall thickening. Tree-in-bud infiltrates throughout all lobes of RIGHT lung. No focal consolidation. Faint ground-glass centrilobular nodules LEFT lung. No pleural effusion. UPPER ABDOMEN: Nonacute.  Status post cholecystectomy. MUSCULOSKELETAL: Nonacute. Mild degenerative change of the thoracic spine. Review of the MIP images confirms the above findings. IMPRESSION: 1. No acute pulmonary embolism. 2. RIGHT tree-in-bud infiltrates and LEFT centrilobular ground-glass nodules may be infectious or inflammatory. No focal consolidation. Aortic Atherosclerosis (ICD10-I70.0). Electronically Signed   By: CElon AlasM.D.   On: 02/21/2018 15:32   Dg Chest  Port 1 View  Result Date: 02/21/2018 CLINICAL DATA:  Back pain and shortness of breath for the past 5 days; current smoker. EXAM: PORTABLE CHEST 1 VIEW COMPARISON:  PA and lateral chest x-ray of September 28, 2016 FINDINGS: The lungs are adequately inflated. The interstitial markings are mildly increased diffusely. The heart and pulmonary vascularity are normal. The mediastinum is normal in width. There is calcification in the wall of the aortic arch. There is no pleural effusion. IMPRESSION: Increased interstitial markings bilaterally may reflect acute on chronic bronchitic kit-smoking related changes. No alveolar pneumonia nor CHF. Thoracic  aortic atherosclerosis. Electronically Signed   By: David  Martinique M.D.   On: 02/21/2018 11:13    EKG: Independently reviewed.  EKG personally reviewed revealed sinus tachycardia with heart rate of 117 and ventricular bigeminy.  Assessment/Plan Present on Admission: . Acute bronchitis . CAP (community acquired pneumonia)  Active Problems:   Acute bronchitis   CAP (community acquired pneumonia)   Sepsis secondary to community acquired pneumonia, poa Viral versus bacterial Lactic acid 7, WBC 14, heart rate 116, respiration rate 32 Chest x-ray and CT chest personally reviewed and revealed bilateral interstitial infiltrates. Respiratory viral panel pending Blood cultures x2 pending Sputum culture pending Obtain procalcitonin Continue IV azithromycin IV ceftriaxone empirically Continue IV Solu-Medrol 60 mg 3 times daily Start duo nebs every 6 hours and every 2 hours as needed Start hypersaline nebs 3 times daily and chest PT 3 times daily Start Mucinex 1200 mg twice daily Start Robitussin every 4 hours as needed for cough Continue O2 supplementation to maintain O2 saturation 92% or greater  Acute hypoxic respiratory failure most likely secondary to community-acquired pneumonia Continue O2 supplementation to maintain O2 saturation greater than  92% Continuous pulse oximetry ABG revealed normal pH, PCO2 30 PO2 78 on 3 L of oxygen by nasal cannula  Lactic acidosis Most likely secondary to hypoxia/CAP Lactic acid on presentation greater than 7 Trending down.  Last lactic acid acid was 5 ABG done today revealed normal pH 7.398, PCO2 30 PO2 78  Intermittent pleuritic chest pain Most likely secondary to intractable cough Troponin less than 0.03 EKG sinus tach with ventricular bigeminy CTA negative for PE: Continue to monitor on telemetry  Type 2 diabetes, uncontrolled due to hyperglycemia Start insulin sliding scale before meals at bedtime Diabetes coordinator consulted Hemoglobin A1c ordered Last A1c 7.6 in 2018 Heart healthy carb diet  Chronic diastolic CHF Last 2D echo done on 02/20/2016 revealed LVEF 50 to 55% with no wall motion abnormality and grade 1 diastolic dysfunction Continue cardiac medications Hold beta-blocker due to acute pulmonary illness Start strict I's and O and daily weight  Hypokalemia Potassium 3.0 Repleted with KCl p.o. 40 mEq 3 times daily x3 doses Added 2 g of IV magnesium Repeat BMP in the morning Order magnesium level in the morning  OSA, compliant with CPAP Continue CPAP at night  Tobacco use disorder Tobacco cessation counseling at bedside Nicotine patch as needed  Generalized anxiety/chronic depression Continue home medications On Valium twice daily as needed IV Ativan daily as needed Continue Prozac  Hyperlipidemia Continue Lipitor      DVT prophylaxis: Lovenox subcu daily  Code Status: Full code  Family Communication: None at bedside  Disposition Plan: Home when clinically stable  Consults called: None  Admission status: Inpatient.  Patient will require at least 2 midnight for acute hypoxic respiratory failure, sepsis secondary to community-acquired pneumonia, lactic acidosis, and electrolyte imbalance.  Risks: Severe due to Lactic acidosis, sepsis secondary to  community-acquired pneumonia, acute hypoxic respiratory failure, multiple comorbidities, and age.    Kayleen Memos MD [facility] Pager [phone]  If 7PM-7AM, please contact night-coverage [email or web address] Password [facility]  02/21/2018, 6:47 PM

## 2018-02-21 NOTE — ED Provider Notes (Signed)
[place] EMERGENCY DEPARTMENT Provider Note   CSN: [id number] Arrival date & time: 02/21/18  4268     History   Chief Complaint Chief Complaint  Patient presents with  . Shortness of Breath    HPI [name] is a 52 y.o. female.  Patient is a 52 year old female with a history of diabetes, hypertension, obstructive sleep apnea and migraines who presents with cough and cold symptoms.  She has a week history of URI symptoms with runny nose congestion coughing.  She states over the last few days her coughing is gotten worse.  She is coughing up green sputum.  She denies any known fevers..  She feels short of breath.  She is been wheezy.  She found an old inhaler that she had from a time when she had bronchitis in the past and has used it with no improvement in symptoms.  She does not use oxygen at home.  She denies any nausea or vomiting.  She has had diffuse fatigue and generalized myalgias.  No leg swelling.  She is been using over-the-counter cold medicines without improvement in symptoms.     Past Medical History:  Diagnosis Date  . Anxiety   . Arthritis   . Diabetes mellitus   . Endometriosis   . Hypersomnia, persistent    epworth 16   . Hypertension   . Lyme borreliosis   . Migraine   . Morbid obesity ([place])   . Obesity, Class III, BMI 40-49.9 (morbid obesity) ([place])   . Obstructive sleep apnea    CPAP NIGHTLY; sleep study 2007.  Marland Kitchen Renal disorder    kidney stone    Patient Active Problem List   Diagnosis Date Noted  . Acute bronchitis 02/21/2018  . Colon, diverticulosis 07/01/2017  . Anxiety 03/08/2016  . Chest pain 02/20/2016  . Vitamin D insufficiency 09/25/2015  . Endometriosis 01/17/2014  . Sleep apnea with use of continuous positive airway pressure (CPAP) 06/27/2013  . Nocturia 06/27/2013  . Obesity, Class III, BMI 40-49.9 (morbid obesity) ([place])   . Obstructive sleep apnea 02/15/2013  . Hypersomnia, persistent   . HTN (hypertension)  02/03/2013  . DM (diabetes mellitus), type 2 ([place]) 02/03/2013    Past Surgical History:  Procedure Laterality Date  . ABLATION ON ENDOMETRIOSIS    . ABLATION ON ENDOMETRIOSIS     10 years ago  . CARDIAC CATHETERIZATION N/A 02/20/2016   Procedure: Left Heart Cath and Coronary Angiography;  Surgeon: [name], MD;  Location: [facility];  Service: Cardiovascular;  Laterality: N/A;  . CHOLECYSTECTOMY    . KNEE ARTHROCENTESIS    . KNEE SURGERY    . TUBAL LIGATION       OB History   None      Home Medications    Prior to Admission medications   Medication Sig Start Date End Date Taking? Authorizing Provider  atorvastatin (LIPITOR) 40 MG tablet Take 1 tablet (40 mg total) by mouth daily. 08/19/17   McVey, Gelene Mink, PA-C  celecoxib (CELEBREX) 200 MG capsule TAKE 1 CAPSULE (200 MG TOTAL) BY MOUTH DAILY. OFFICE VISIT NEEDED 02/02/18   Gale Journey, Damaris Hippo, PA-C  cyclobenzaprine (FLEXERIL) 5 MG tablet TAKE 1 TABLET (5 MG TOTAL) BY MOUTH 3 (THREE) TIMES DAILY AS NEEDED FOR MUSCLE SPASMS. 06/01/16   Gale Journey, Damaris Hippo, PA-C  diazepam (VALIUM) 2 MG tablet Take 0.5-1 tablets (1-2 mg total) every 12 (twelve) hours as needed by mouth for anxiety or muscle spasms. 08/16/17   McVey,  Gelene Mink, PA-C  dicyclomine (BENTYL) 20 MG tablet Take 1 tablet (20 mg total) by mouth 2 (two) times daily. 05/27/17   Tanna Furry, MD  FLUoxetine (PROZAC) 20 MG capsule Take 1 capsule (20 mg total) by mouth 2 (two) times daily. 07/01/17   Weber, Damaris Hippo, PA-C  FLUoxetine (PROZAC) 20 MG capsule TAKE 1 CAPSULE (20 MG TOTAL) BY MOUTH 2 (TWO) TIMES DAILY. 07/21/17   Weber, Damaris Hippo, PA-C  furosemide (LASIX) 20 MG tablet TAKE 1/2 TO 1 TABLET BY MOUTH EVERY DAY 01/09/18   Weber, Damaris Hippo, PA-C  gemfibrozil (LOPID) 600 MG tablet TAKE 1 TABLET BY MOUTH TWICE A DAY BEFORE A MEAL 02/21/18   Weber, Sarah L, PA-C  glucose blood test strip Use as instructed 06/01/16   Weber, Damaris Hippo, PA-C  HYDROcodone-acetaminophen  (NORCO/VICODIN) 5-325 MG tablet Take 2 tablets by mouth every 4 (four) hours as needed. 01/12/18   Weber, Damaris Hippo, PA-C  Insulin Glargine (TOUJEO SOLOSTAR) 300 UNIT/ML SOPN Inject 80 Units daily into the skin. 08/16/17   McVey, Gelene Mink, PA-C  liraglutide (VICTOZA) 18 MG/3ML SOPN INJECT 0.3MLS SUBCUTANEOUSLY DAILY 08/16/17   McVey, Gelene Mink, PA-C  Lorcaserin HCl 10 MG TABS Take 10 mg by mouth daily. 11/13/16   Gale Journey, Damaris Hippo, PA-C  metoprolol tartrate (LOPRESSOR) 50 MG tablet Take 1 tablet (50 mg total) by mouth 2 (two) times daily. Ov needed 02/21/18   Weber, Sarah L, PA-C  NOVOTWIST 32G X 5 MM MISC USE 1 APPLICATION BY DOES NOT APPLY ROUTE DAILY. 11/22/17   Gale Journey, Damaris Hippo, PA-C  phentermine 37.5 MG capsule Take 1 capsule (37.5 mg total) every morning by mouth. 08/16/17   McVey, Gelene Mink, PA-C  promethazine (PHENERGAN) 25 MG tablet Take 1 tablet (25 mg total) by mouth every 8 (eight) hours as needed for nausea or vomiting. 11/11/17   Quintella Reichert, MD  Ranitidine HCl (ZANTAC PO) Take 1 tablet by mouth 2 (two) times daily. Patient uses this medication for heartburn.  06/03/13  [provider]    Family History Family History  Problem Relation Age of Onset  . Diabetes Mother   . Thyroid disease Mother   . Depression Mother        with anxiety  . Stroke Mother   . Diabetes Father   . Hypertension Father   . Diabetes Sister   . Hypertension Sister   . Hypertension Brother     Social History Social History   Tobacco Use  . Smoking status: Current Every Day Smoker    Packs/day: 0.20    Years: 0.00    Pack years: 0.00    Types: Cigarettes  . Smokeless tobacco: Never Used  Substance Use Topics  . Alcohol use: No  . Drug use: No     Allergies   Contrast media [iodinated diagnostic agents]; Darvocet [propoxyphene n-acetaminophen]; Lisinopril; Metformin and related; Morphine and related; Percocet [oxycodone-acetaminophen]; Robaxin [methocarbamol];  Wellbutrin [bupropion hcl]; and Zofran [ondansetron hcl]   Review of Systems Review of Systems  Constitutional: Positive for fatigue. Negative for chills, diaphoresis and fever.  HENT: Negative for congestion, rhinorrhea and sneezing.   Eyes: Negative.   Respiratory: Positive for cough, shortness of breath and wheezing. Negative for chest tightness.   Cardiovascular: Negative for chest pain and leg swelling.  Gastrointestinal: Negative for abdominal pain, blood in stool, diarrhea, nausea and vomiting.  Genitourinary: Negative for difficulty urinating, flank pain, frequency and hematuria.  Musculoskeletal: Negative for arthralgias and back pain.  Skin: Negative for rash.  Neurological: Negative for dizziness, speech difficulty, weakness, numbness and headaches.     Physical Exam Updated Vital Signs BP 140/75   Pulse (!) 106   Temp 98.6 F (37 C) (Oral)   Resp (!) 21   LMP 02/09/2018   SpO2 94%   Physical Exam  Constitutional: She is oriented to person, place, and time. She appears well-developed and well-nourished.  HENT:  Head: Normocephalic and atraumatic.  Eyes: Pupils are equal, round, and reactive to light.  Neck: Normal range of motion. Neck supple.  Cardiovascular: Normal rate, regular rhythm and normal heart sounds.  Pulmonary/Chest: Effort normal. Tachypnea noted. No respiratory distress. She has wheezes. She has rhonchi. She has no rales. She exhibits no tenderness.  Abdominal: Soft. Bowel sounds are normal. There is no tenderness. There is no rebound and no guarding.  Musculoskeletal: Normal range of motion. She exhibits no edema.  Lymphadenopathy:    She has no cervical adenopathy.  Neurological: She is alert and oriented to person, place, and time.  Skin: Skin is warm and dry. No rash noted.  Psychiatric: She has a normal mood and affect.     ED Treatments / Results  Labs (all labs ordered are listed, but only abnormal results are displayed) Labs Reviewed    BASIC METABOLIC PANEL - Abnormal; Notable for the following components:      Result Value   Potassium 3.0 (*)    CO2 20 (*)    Glucose, Bld 360 (*)    Calcium 8.4 (*)    All other components within normal limits  CBC WITH DIFFERENTIAL/PLATELET - Abnormal; Notable for the following components:   WBC 14.0 (*)    Neutro Abs 9.3 (*)    All other components within normal limits  D-DIMER, QUANTITATIVE (NOT AT [facility]) - Abnormal; Notable for the following components:   D-Dimer, Quant 0.63 (*)    All other components within normal limits  I-STAT CG4 LACTIC ACID, ED - Abnormal; Notable for the following components:   Lactic Acid, Venous 2.43 (*)    All other components within normal limits  I-STAT CG4 LACTIC ACID, ED - Abnormal; Notable for the following components:   Lactic Acid, Venous 7.05 (*)    All other components within normal limits  I-STAT CG4 LACTIC ACID, ED - Abnormal; Notable for the following components:   Lactic Acid, Venous 5.58 (*)    All other components within normal limits  CULTURE, BLOOD (ROUTINE X 2)  CULTURE, BLOOD (ROUTINE X 2)  TROPONIN I  INFLUENZA PANEL BY PCR (TYPE A & B)    EKG EKG Interpretation  Date/Time:  Tuesday Feb 21 2018 10:45:22 EDT Ventricular Rate:  117 PR Interval:    QRS Duration: 92 QT Interval:  352 QTC Calculation: 492 R Axis:   100 Text Interpretation:  Sinus tachycardia Ventricular trigeminy Right axis deviation Probable anteroseptal infarct, old SINCE LAST TRACING HEART RATE HAS INCREASED Confirmed by Malvin Johns [phone]) on 02/21/2018 11:38:41 AM   Radiology Ct Angio Chest Pe W/cm &/or Wo Cm  Result Date: 02/21/2018 CLINICAL DATA:  Shortness of breath on oxygen. Suspect pulmonary embolism. History of hypertension, cardiac catheterization. EXAM: CT ANGIOGRAPHY CHEST WITH CONTRAST TECHNIQUE: Multidetector CT imaging of the chest was performed using the standard protocol during bolus administration of intravenous contrast. Multiplanar  CT image reconstructions and MIPs were obtained to evaluate the vascular anatomy. CONTRAST:  79m ISOVUE-370 IOPAMIDOL (ISOVUE-370) INJECTION 76% COMPARISON:  Chest radiograph Feb 21, 2018 FINDINGS:  CARDIOVASCULAR: Suboptimal contrast opacification of the pulmonary artery's (150 Hounsfield units, target is 250 Hounsfield units). Main pulmonary artery is not enlarged. No pulmonary arterial filling defects to the level of the segmental branches. Heart size is normal, no right heart strain. No pericardial effusion. Thoracic aorta is normal course and caliber, mild calcific atherosclerosis. MEDIASTINUM/NODES: Borderline mediastinal lymphadenopathy attributable to parenchymal process, no routine indicated follow-up (This recommendation follows ACR consensus guidelines: Managing Incidental Findings on Thoracic CT: Mediastinal and Cardiovascular Findings. A White Paper of the ACR Incidental Findings Committee. J Am Coll Radiol. 2018; 15: 3785-8850). LUNGS/PLEURA: Tracheobronchial tree is patent, no pneumothorax. Mild bronchial wall thickening. Tree-in-bud infiltrates throughout all lobes of RIGHT lung. No focal consolidation. Faint ground-glass centrilobular nodules LEFT lung. No pleural effusion. UPPER ABDOMEN: Nonacute.  Status post cholecystectomy. MUSCULOSKELETAL: Nonacute. Mild degenerative change of the thoracic spine. Review of the MIP images confirms the above findings. IMPRESSION: 1. No acute pulmonary embolism. 2. RIGHT tree-in-bud infiltrates and LEFT centrilobular ground-glass nodules may be infectious or inflammatory. No focal consolidation. Aortic Atherosclerosis (ICD10-I70.0). Electronically Signed   By: Elon Alas M.D.   On: 02/21/2018 15:32   Dg Chest Port 1 View  Result Date: 02/21/2018 CLINICAL DATA:  Back pain and shortness of breath for the past 5 days; current smoker. EXAM: PORTABLE CHEST 1 VIEW COMPARISON:  PA and lateral chest x-ray of September 28, 2016 FINDINGS: The lungs are adequately  inflated. The interstitial markings are mildly increased diffusely. The heart and pulmonary vascularity are normal. The mediastinum is normal in width. There is calcification in the wall of the aortic arch. There is no pleural effusion. IMPRESSION: Increased interstitial markings bilaterally may reflect acute on chronic bronchitic kit-smoking related changes. No alveolar pneumonia nor CHF. Thoracic aortic atherosclerosis. Electronically Signed   By: David  Martinique M.D.   On: 02/21/2018 11:13    Procedures Procedures (including critical care time)  Medications Ordered in ED Medications  cefTRIAXone (ROCEPHIN) 2 g in sodium chloride 0.9 % 100 mL IVPB (0 g Intravenous Stopped 02/21/18 1247)  azithromycin (ZITHROMAX) 500 mg in sodium chloride 0.9 % 250 mL IVPB (0 mg Intravenous Stopped 02/21/18 1320)  azithromycin (ZITHROMAX) 500 MG injection (has no administration in time range)  sodium chloride 0.9 % bolus 1,000 mL (1,000 mLs Intravenous Transfusing/Transfer 02/21/18 1528)  sodium chloride 0.9 % bolus 1,000 mL (has no administration in time range)  albuterol (PROVENTIL) (2.5 MG/3ML) 0.083% nebulizer solution (2.5 mg  Given 02/21/18 0959)  ipratropium-albuterol (DUONEB) 0.5-2.5 (3) MG/3ML nebulizer solution (3 mLs  Given 02/21/18 0959)  methylPREDNISolone sodium succinate (SOLU-MEDROL) 125 mg/2 mL injection 125 mg (125 mg Intravenous Given 02/21/18 1112)  sodium chloride 0.9 % bolus 1,000 mL (0 mLs Intravenous Stopped 02/21/18 1247)  acetaminophen (TYLENOL) tablet 1,000 mg (1,000 mg Oral Given 02/21/18 1147)  albuterol (PROVENTIL) (2.5 MG/3ML) 0.083% nebulizer solution 5 mg (5 mg Nebulization Given 02/21/18 1211)  LORazepam (ATIVAN) injection 1 mg (1 mg Intravenous Given 02/21/18 1321)  oseltamivir (TAMIFLU) capsule 75 mg (75 mg Oral Given 02/21/18 1412)  diphenhydrAMINE (BENADRYL) injection 50 mg (50 mg Intravenous Given 02/21/18 1405)  iopamidol (ISOVUE-370) 76 % injection 100 mL (68 mLs Intravenous Contrast  Given 02/21/18 1504)     Initial Impression / Assessment and Plan / ED Course  I have reviewed the triage vital signs and the nursing notes.  Pertinent labs & imaging results that were available during my care of the patient were reviewed by me and considered in my medical decision making (see  chart for details).     Patient is a 52 year old female who presents with wheezing and URI symptoms.  Her chest x-ray is negative for infiltrate.  She initially was very wheezy and hypoxic on arrival.  She was given nebulizer treatments and steroids.  She was treated for possible sepsis with IV antibiotics for community-acquired pneumonia and IV fluids.  Her initial lactate was 2.4.  She was not initially given a full 30 cc/kg bolus because of that.  However her second lactate 2 hours later went up to 7.  This was repeated and it was 5.5.  Given this, she was started on the full 30 cc/kg bolus.  At one point she did become acutely more short of breath.  She looks very anxious.  She does report a history of anxiety.  She was maintaining oxygen saturations in the mid 90s on her nasal cannula.  Her heart rate went up though and she was more short of breath.  Her lung sounds actually were improved from the initial assessment.  She was given a dose of Ativan and a d-dimer was ordered to further assess for PE.  Her d-dimer is positive.  She does have an allergy to contrast medium.  She will be premedicated and get a CT angios.  She has already had Solu-Medrol and will be given Benadryl 1 hour before the scan.  I have spoken with Dr. Landis Gandy who has accepted the patient for transfer to [facility].  Patient's breathing has much improved after the Ativan.  She is much less tachypneic and her heart rate has improved.  She is maintaining normal oxygen saturations on nasal cannula.  CT scan shows no evidence of pulmonary embolus.  There is but entry process which could represent infectious versus inflammatory disease.   Patient is awaiting transfer to [facility].  CRITICAL CARE Performed by: Malvin Johns Total critical care time: 75 minutes Critical care time was exclusive of separately billable procedures and treating other patients. Critical care was necessary to treat or prevent imminent or life-threatening deterioration. Critical care was time spent personally by me on the following activities: development of treatment plan with patient and/or surrogate as well as nursing, discussions with consultants, evaluation of patient's response to treatment, examination of patient, obtaining history from patient or surrogate, ordering and performing treatments and interventions, ordering and review of laboratory studies, ordering and review of radiographic studies, pulse oximetry and re-evaluation of patient's condition.   Final Clinical Impressions(s) / ED Diagnoses   Final diagnoses:  Bronchitis  Wheezing  Hypoxia    ED Discharge Orders    None       Malvin Johns, MD 02/21/18 1535

## 2018-02-21 NOTE — ED Notes (Signed)
Report to [place], Therapist, sports on [facility] 2W

## 2018-02-21 NOTE — Progress Notes (Addendum)
51 year old female with past medical history of diabetes, hyperlipidemia, hypertension, presents with shortness of breath, cough, increased work of breathing, chest x-ray with no evidence of pneumonia, but hypoxic in the mid 80s on room air, cough with productive sputum, afebrile with some leukocytosis, with significant wheezing, most likely viral upper airway disease/acute bronchitis reactive airway, received azithromycin, Rocephin and IV Solu-Medrol, low panel pending, d-dimer is pending as well, if positive will need CTA chest.  Addendum: - Lactic  acid elevated at 7, dimers elevated at 0.63, requested to have another fluid bolus given, and to premedicate for CTA chest either to be done there or here.  they are unable to do rapid flu test at [facility], so will receive 1 dose of Tamiflu empirically. Phillips Climes MD

## 2018-02-21 NOTE — ED Notes (Signed)
Critical Lactic Acid 7.05 reported to Dr. Tamera Punt. Ordered repeat Lactic.

## 2018-02-21 NOTE — ED Notes (Signed)
Critical Lactic Acid 2.43 results reported to Dr. Tamera Punt and Vincente Liberty, RN

## 2018-02-21 NOTE — ED Notes (Signed)
Repeat Critical Lactic Acid of 5.58 reported to Dr. Tamera Punt.

## 2018-02-21 NOTE — ED Triage Notes (Signed)
PT presents with c/o cough and shortness of breath and weakness and unable to eat since Thursday. PT also reports right flank pain.

## 2018-02-21 NOTE — Telephone Encounter (Signed)
gemfibrozil refill Last OV: 08/16/17 Last Refill:08/19/17 #60 tabs 6 RF Pharmacy:[facility] [address]  Liver: 11/11/17  Lipids: 08/16/17

## 2018-02-22 DIAGNOSIS — J9601 Acute respiratory failure with hypoxia: Secondary | ICD-10-CM

## 2018-02-22 DIAGNOSIS — A419 Sepsis, unspecified organism: Principal | ICD-10-CM

## 2018-02-22 DIAGNOSIS — J189 Pneumonia, unspecified organism: Secondary | ICD-10-CM

## 2018-02-22 DIAGNOSIS — Z794 Long term (current) use of insulin: Secondary | ICD-10-CM

## 2018-02-22 DIAGNOSIS — Z9989 Dependence on other enabling machines and devices: Secondary | ICD-10-CM

## 2018-02-22 DIAGNOSIS — E119 Type 2 diabetes mellitus without complications: Secondary | ICD-10-CM

## 2018-02-22 DIAGNOSIS — G4733 Obstructive sleep apnea (adult) (pediatric): Secondary | ICD-10-CM

## 2018-02-22 LAB — BASIC METABOLIC PANEL
ANION GAP: 13 (ref 5–15)
Anion gap: 11 (ref 5–15)
BUN: 10 mg/dL (ref 6–20)
BUN: 13 mg/dL (ref 6–20)
CALCIUM: 8.5 mg/dL — AB (ref 8.9–10.3)
CO2: 20 mmol/L — ABNORMAL LOW (ref 22–32)
CO2: 21 mmol/L — AB (ref 22–32)
CREATININE: 0.79 mg/dL (ref 0.44–1.00)
Calcium: 8.7 mg/dL — ABNORMAL LOW (ref 8.9–10.3)
Chloride: 106 mmol/L (ref 101–111)
Chloride: 105 mmol/L (ref 101–111)
Creatinine, Ser: 0.96 mg/dL (ref 0.44–1.00)
GFR calc non Af Amer: >60 mL/min (ref >60)
GLUCOSE: 341 mg/dL — AB (ref 65–99)
Glucose, Bld: 462 mg/dL — ABNORMAL HIGH (ref 65–99)
Potassium: 3.8 mmol/L (ref 3.5–5.1)
Potassium: 4.1 mmol/L (ref 3.5–5.1)
SODIUM: 137 mmol/L (ref 135–145)
Sodium: 139 mmol/L (ref 135–145)

## 2018-02-22 LAB — RESPIRATORY PANEL BY PCR
Adenovirus: NOT DETECTED
BORDETELLA PERTUSSIS-RVPCR: NOT DETECTED
Chlamydophila pneumoniae: NOT DETECTED
Coronavirus 229E: NOT DETECTED
Coronavirus HKU1: NOT DETECTED
Coronavirus NL63: NOT DETECTED
Coronavirus OC43: NOT DETECTED
INFLUENZA B-RVPPCR: NOT DETECTED
Influenza A: NOT DETECTED
Metapneumovirus: NOT DETECTED
Mycoplasma pneumoniae: DETECTED — AB
PARAINFLUENZA VIRUS 3-RVPPCR: NOT DETECTED
PARAINFLUENZA VIRUS 4-RVPPCR: NOT DETECTED
Parainfluenza Virus 1: NOT DETECTED
Parainfluenza Virus 2: NOT DETECTED
RESPIRATORY SYNCYTIAL VIRUS-RVPPCR: NOT DETECTED
RHINOVIRUS / ENTEROVIRUS - RVPPCR: NOT DETECTED

## 2018-02-22 LAB — CBC
HCT: 36 % (ref 36.0–46.0)
Hemoglobin: 11.9 g/dL — ABNORMAL LOW (ref 12.0–15.0)
MCH: 30.4 pg (ref 26.0–34.0)
MCHC: 33.1 g/dL (ref 30.0–36.0)
MCV: 91.8 fL (ref 78.0–100.0)
PLATELETS: 287 10*3/uL (ref 150–400)
RBC: 3.92 MIL/uL (ref 3.87–5.11)
RDW: 14.2 % (ref 11.5–15.5)
WBC: 17.8 10*3/uL — AB (ref 4.0–10.5)

## 2018-02-22 LAB — GLUCOSE, CAPILLARY
GLUCOSE-CAPILLARY: 433 mg/dL — AB (ref 65–99)
Glucose-Capillary: 315 mg/dL — ABNORMAL HIGH (ref 65–99)
Glucose-Capillary: 343 mg/dL — ABNORMAL HIGH (ref 65–99)
Glucose-Capillary: 498 mg/dL — ABNORMAL HIGH (ref 65–99)
Glucose-Capillary: 365 mg/dL — ABNORMAL HIGH (ref 65–99)

## 2018-02-22 LAB — LACTIC ACID, PLASMA: Lactic Acid, Venous: 1.2 mmol/L (ref 0.5–1.9)

## 2018-02-22 LAB — MAGNESIUM: MAGNESIUM: 2.3 mg/dL (ref 1.7–2.4)

## 2018-02-22 LAB — HEMOGLOBIN A1C
HEMOGLOBIN A1C: 8.2 % — AB (ref 4.8–5.6)
Mean Plasma Glucose: 188.64 mg/dL

## 2018-02-22 LAB — PROCALCITONIN: Procalcitonin: 0.26 ng/mL

## 2018-02-22 MED ORDER — INSULIN ASPART 100 UNIT/ML ~~LOC~~ SOLN
15.0000 [IU] | Freq: Once | SUBCUTANEOUS | Status: AC
  Administered 2018-02-22: 15 [IU] via SUBCUTANEOUS

## 2018-02-22 MED ORDER — AZITHROMYCIN 250 MG PO TABS
500.0000 mg | ORAL_TABLET | Freq: Every day | ORAL | Status: DC
  Administered 2018-02-22 – 2018-02-28 (×7): 500 mg via ORAL
  Filled 2018-02-22 (×9): qty 2

## 2018-02-22 MED ORDER — METHYLPREDNISOLONE SODIUM SUCC 125 MG IJ SOLR
60.0000 mg | Freq: Two times a day (BID) | INTRAMUSCULAR | Status: DC
  Administered 2018-02-22 – 2018-02-27 (×10): 60 mg via INTRAVENOUS
  Filled 2018-02-22 (×10): qty 2

## 2018-02-22 MED ORDER — INSULIN ASPART 100 UNIT/ML ~~LOC~~ SOLN
5.0000 [IU] | Freq: Three times a day (TID) | SUBCUTANEOUS | Status: DC
Start: 2018-02-22 — End: 2018-02-23
  Administered 2018-02-22 – 2018-02-23 (×3): 5 [IU] via SUBCUTANEOUS

## 2018-02-22 MED ORDER — METOPROLOL TARTRATE 12.5 MG HALF TABLET
12.5000 mg | ORAL_TABLET | Freq: Two times a day (BID) | ORAL | Status: DC
  Administered 2018-02-22 – 2018-02-28 (×11): 12.5 mg via ORAL
  Filled 2018-02-22 (×14): qty 1

## 2018-02-22 MED ORDER — DOXYCYCLINE HYCLATE 100 MG PO TABS
100.0000 mg | ORAL_TABLET | Freq: Two times a day (BID) | ORAL | Status: DC
  Administered 2018-02-22 – 2018-02-28 (×12): 100 mg via ORAL
  Filled 2018-02-22 (×12): qty 1

## 2018-02-22 MED ORDER — HYDROCODONE-ACETAMINOPHEN 5-325 MG PO TABS
1.0000 | ORAL_TABLET | Freq: Once | ORAL | Status: AC
  Administered 2018-02-22: 1 via ORAL
  Filled 2018-02-22: qty 1

## 2018-02-22 MED ORDER — INSULIN GLARGINE 100 UNIT/ML ~~LOC~~ SOLN
80.0000 [IU] | Freq: Every day | SUBCUTANEOUS | Status: DC
  Administered 2018-02-22 – 2018-02-23 (×2): 80 [IU] via SUBCUTANEOUS
  Filled 2018-02-22 (×2): qty 0.8

## 2018-02-22 NOTE — Progress Notes (Signed)
Gave 2 forms for AD-her and her husband.  Call chaplain when ready to have forms notarized/Tiffiany Beadles Thompson Caul, Bonney Roussel   02/22/18 1200  Clinical Encounter Type  Visited With Patient  Visit Type Initial;Spiritual support  Referral From Physician  Consult/Referral To Chaplain  Spiritual Encounters  Spiritual Needs Prayer;Emotional;Other (Comment) (husband has stage 4 cancer)  Stress Factors  Patient Stress Factors Exhausted;Health changes;Loss  Family Stress Factors Loss (husband has stage 4 cancer)

## 2018-02-22 NOTE — Progress Notes (Signed)
PROGRESS NOTE  [name] JKK:[id number] DOB: 1965-12-31 DOA: 02/21/2018 PCP: Mancel Bale, PA-C  HPI/Recap of past 24 hours:  Blood sugar elevated Still has cough and dyspnea   Assessment/Plan: Active Problems:   Acute bronchitis   CAP (community acquired pneumonia)   Sepsis with community acquired pneumonia, acute respiratory failure -she presented with leukocytosis, lactic acidosis, sinus tachycardia, tachypnea, hypoxia  on admission -CTA no pe, does has " RIGHT tree-in-bud infiltrates and LEFT centrilobular ground-glass nodules may be infectious or inflammatory. No focal consolidation." -blood culture no growth -respiratory panel + mycoplasam pneumoniae -d/c rocephin, continue zitrhmax, add doxycycline due to severity of symptom. Taper steroids, continue nebs, mucinex, wean oxygen  Chronic diastolic chf: -Last ef done in 2017,  -Currently appear euvolemic, hold lasix in the setting of sepsis -lopressor dose reduced, continue adjust lopressor, repeat echo   Insulin dependent DM2 -a1c 8.2 -blood glucose elevated, likely due to steroids, expect quick taper steroids, -continue adjust insulin  HLD; continue home meds  OSA/ Body mass index is 46.13 kg/m. meet morbid obesity criteria Continue cpap, encourage weight loss   Tobacco use disorder Tobacco cessation counseling at bedside Nicotine patch as needed  Generalized anxiety/chronic depression Continue home medications On Valium twice daily as needed IV Ativan daily as needed     Code Status: full  Family Communication: patient   Disposition Plan: home in 1-2 days, pending respiratory status, blood glucose control   Consultants:  Diabetes coordinator  Procedures:  *  Antibiotics:  *   Objective: BP 109/84 (BP Location: Left Arm)   Pulse 97   Temp 98.5 F (36.9 C) (Oral)   Resp (!) 22   Ht 5\' 2"  (1.575 m)   Wt 114.4 kg (252 lb 3.3 oz)   LMP 02/09/2018   SpO2 97%   BMI 46.13  kg/m   Intake/Output Summary (Last 24 hours) at 02/22/2018 1156 Last data filed at 02/21/2018 2200 Gross per 24 hour  Intake 3070 ml  Output -  Net 3070 ml   Filed Weights   02/21/18 1742 02/22/18 0406  Weight: 114.2 kg (251 lb 12.2 oz) 114.4 kg (252 lb 3.3 oz)    Exam: Patient is examined daily including today on 02/22/2018, exams remain the same as of yesterday except that has changed    General:  NAD  Cardiovascular: mild sinus tachycardai  Respiratory: mild wheezing,   Abdomen: Soft/ND/NT, positive BS  Musculoskeletal: No Edema  Neuro: alert, oriented   Data Reviewed: Basic Metabolic Panel: Recent Labs  Lab 02/21/18 1111 02/22/18 0329  NA 135 139  K 3.0* 3.8  CL 101 106  CO2 20* 20*  GLUCOSE 360* 341*  BUN 9 10  CREATININE 0.78 0.79  CALCIUM 8.4* 8.7*  MG  --  2.3   Liver Function Tests: No results for input(s): AST, ALT, ALKPHOS, BILITOT, PROT, ALBUMIN in the last 168 hours. No results for input(s): LIPASE, AMYLASE in the last 168 hours. No results for input(s): AMMONIA in the last 168 hours. CBC: Recent Labs  Lab 02/21/18 1111 02/22/18 0329  WBC 14.0* 17.8*  NEUTROABS 9.3*  --   HGB 13.4 11.9*  HCT 37.6 36.0  MCV 88.7 91.8  PLT 257 287   Cardiac Enzymes:   Recent Labs  Lab 02/21/18 1111  TROPONINI <0.03   BNP (last 3 results) No results for input(s): BNP in the last 8760 hours.  ProBNP (last 3 results) No results for input(s): PROBNP in the last 8760 hours.  CBG:  Recent Labs  Lab 02/21/18 2128 02/22/18 0507 02/22/18 0808 02/22/18 1145  GLUCAP 563* 315* 343* 433*    No results found for this or any previous visit (from the past 240 hour(s)).   Studies: Ct Angio Chest Pe W/cm &/or Wo Cm  Result Date: 02/21/2018 CLINICAL DATA:  Shortness of breath on oxygen. Suspect pulmonary embolism. History of hypertension, cardiac catheterization. EXAM: CT ANGIOGRAPHY CHEST WITH CONTRAST TECHNIQUE: Multidetector CT imaging of the chest was  performed using the standard protocol during bolus administration of intravenous contrast. Multiplanar CT image reconstructions and MIPs were obtained to evaluate the vascular anatomy. CONTRAST:  28mL ISOVUE-370 IOPAMIDOL (ISOVUE-370) INJECTION 76% COMPARISON:  Chest radiograph Feb 21, 2018 FINDINGS: CARDIOVASCULAR: Suboptimal contrast opacification of the pulmonary artery's (150 Hounsfield units, target is 250 Hounsfield units). Main pulmonary artery is not enlarged. No pulmonary arterial filling defects to the level of the segmental branches. Heart size is normal, no right heart strain. No pericardial effusion. Thoracic aorta is normal course and caliber, mild calcific atherosclerosis. MEDIASTINUM/NODES: Borderline mediastinal lymphadenopathy attributable to parenchymal process, no routine indicated follow-up (This recommendation follows ACR consensus guidelines: Managing Incidental Findings on Thoracic CT: Mediastinal and Cardiovascular Findings. A White Paper of the ACR Incidental Findings Committee. J Am Coll Radiol. 2018; 15: 2703-5009). LUNGS/PLEURA: Tracheobronchial tree is patent, no pneumothorax. Mild bronchial wall thickening. Tree-in-bud infiltrates throughout all lobes of RIGHT lung. No focal consolidation. Faint ground-glass centrilobular nodules LEFT lung. No pleural effusion. UPPER ABDOMEN: Nonacute.  Status post cholecystectomy. MUSCULOSKELETAL: Nonacute. Mild degenerative change of the thoracic spine. Review of the MIP images confirms the above findings. IMPRESSION: 1. No acute pulmonary embolism. 2. RIGHT tree-in-bud infiltrates and LEFT centrilobular ground-glass nodules may be infectious or inflammatory. No focal consolidation. Aortic Atherosclerosis (ICD10-I70.0). Electronically Signed   By: Elon Alas M.D.   On: 02/21/2018 15:32    Scheduled Meds: . atorvastatin  40 mg Oral Daily  . celecoxib  200 mg Oral Daily  . enoxaparin (LOVENOX) injection  40 mg Subcutaneous Q24H  .  FLUoxetine  20 mg Oral BID  . furosemide  20 mg Oral Daily  . guaiFENesin  1,200 mg Oral BID  . insulin aspart  0-15 Units Subcutaneous TID WC  . insulin aspart  0-5 Units Subcutaneous QHS  . insulin aspart  5 Units Subcutaneous TID WC  . insulin glargine  80 Units Subcutaneous Daily  . ipratropium-albuterol  3 mL Nebulization TID  . methylPREDNISolone (SOLU-MEDROL) injection  60 mg Intravenous Q12H  . potassium chloride  40 mEq Oral TID  . sodium chloride HYPERTONIC  4 mL Nebulization TID    Continuous Infusions: . azithromycin 500 mg (02/22/18 1047)  . cefTRIAXone (ROCEPHIN)  IV Stopped (02/21/18 1247)  . sodium chloride       Time spent: 78mins I have personally reviewed and interpreted on  02/22/2018 daily labs, tele strips, imagings as discussed above under date review session and assessment and plans.  I reviewed all nursing notes, pharmacy notes,  vitals, pertinent old records  I have discussed plan of care as described above with RN , patient on 02/22/2018   Florencia Reasons MD, PhD  [facility] Pager [phone]. If 7PM-7AM, please contact night-coverage at [email or web address], password [facility] 02/22/2018, 11:56 AM  LOS: 1 day

## 2018-02-22 NOTE — Progress Notes (Signed)
Inpatient Diabetes Program Recommendations  AACE/ADA: New Consensus Statement on Inpatient Glycemic Control (2015)  Target Ranges:  Prepandial:   less than 140 mg/dL      Peak postprandial:   less than 180 mg/dL (1-2 hours)      Critically ill patients:  140 - 180 mg/dL   Lab Results  Component Value Date   GLUCAP 343 (H) 02/22/2018   HGBA1C 8.2 (H) 02/22/2018   Review of Glycemic Control  Diabetes history: DM 2 Outpatient Diabetes medications: Toujeo 80 units Daily, Victoza 1.8 Daily Current orders for Inpatient glycemic control: Novolog Moderate Correction 0-15 units tid, Novolog HS scale 0-5 units  Inpatient Diabetes Program Recommendations:    Noted Patient receiving IV Solumedrol 60 mg tid. Patient takes basal insulin at home. Consider starting a portion of patients basal insulin, Lantus 20-25 units.  Thanks,  Tama Headings RN, MSN, BC-ADM, [facility] Inpatient Diabetes Coordinator Team Pager [phone] (8a-5p)

## 2018-02-22 NOTE — Progress Notes (Signed)
Took Advance Directives form to patient.  She mentioned her husband would also like to complete AD form due to his stage 4 cancer.  Patient very emotional.  They want time to look over and study forms before actually completing them. Gave her 2 for her and for her husband.  Had prayer and provided spiritual support.  Conard Novak, Chaplain   02/22/18 1200  Clinical Encounter Type  Visited With Patient  Visit Type Initial;Spiritual support  Referral From Physician  Consult/Referral To Chaplain  Spiritual Encounters  Spiritual Needs Prayer;Emotional;Other (Comment) (husband has stage 4 cancer)  Stress Factors  Patient Stress Factors Exhausted;Health changes;Loss  Family Stress Factors Loss (husband has stage 4 cancer)

## 2018-02-23 ENCOUNTER — Inpatient Hospital Stay (HOSPITAL_COMMUNITY): Payer: BLUE CROSS/BLUE SHIELD

## 2018-02-23 DIAGNOSIS — I5032 Chronic diastolic (congestive) heart failure: Secondary | ICD-10-CM

## 2018-02-23 LAB — CBC
HEMATOCRIT: 34.5 % — AB (ref 36.0–46.0)
Hemoglobin: 11.8 g/dL — ABNORMAL LOW (ref 12.0–15.0)
MCH: 32.2 pg (ref 26.0–34.0)
MCHC: 34.2 g/dL (ref 30.0–36.0)
MCV: 94 fL (ref 78.0–100.0)
PLATELETS: 292 10*3/uL (ref 150–400)
RBC: 3.67 MIL/uL — AB (ref 3.87–5.11)
RDW: 14.3 % (ref 11.5–15.5)
WBC: 24.6 10*3/uL — AB (ref 4.0–10.5)

## 2018-02-23 LAB — BASIC METABOLIC PANEL
ANION GAP: 9 (ref 5–15)
BUN: 17 mg/dL (ref 6–20)
CALCIUM: 8.7 mg/dL — AB (ref 8.9–10.3)
CO2: 21 mmol/L — ABNORMAL LOW (ref 22–32)
Chloride: 107 mmol/L (ref 101–111)
Creatinine, Ser: 0.76 mg/dL (ref 0.44–1.00)
GLUCOSE: 338 mg/dL — AB (ref 65–99)
POTASSIUM: 5 mmol/L (ref 3.5–5.1)
Sodium: 137 mmol/L (ref 135–145)

## 2018-02-23 LAB — ECHOCARDIOGRAM COMPLETE
HEIGHTINCHES: 62 in
WEIGHTICAEL: 4083.2 [oz_av]

## 2018-02-23 LAB — MAGNESIUM: Magnesium: 2.1 mg/dL (ref 1.7–2.4)

## 2018-02-23 LAB — GLUCOSE, CAPILLARY
GLUCOSE-CAPILLARY: 351 mg/dL — AB (ref 65–99)
GLUCOSE-CAPILLARY: 350 mg/dL — AB (ref 65–99)
GLUCOSE-CAPILLARY: 325 mg/dL — AB (ref 65–99)
Glucose-Capillary: 338 mg/dL — ABNORMAL HIGH (ref 65–99)

## 2018-02-23 MED ORDER — FUROSEMIDE 10 MG/ML IJ SOLN
40.0000 mg | Freq: Once | INTRAMUSCULAR | Status: AC
  Administered 2018-02-23: 40 mg via INTRAVENOUS
  Filled 2018-02-23: qty 4

## 2018-02-23 MED ORDER — KETOROLAC TROMETHAMINE 30 MG/ML IJ SOLN
30.0000 mg | Freq: Once | INTRAMUSCULAR | Status: AC
  Administered 2018-02-23: 30 mg via INTRAVENOUS
  Filled 2018-02-23: qty 1

## 2018-02-23 MED ORDER — INSULIN ASPART 100 UNIT/ML ~~LOC~~ SOLN
8.0000 [IU] | Freq: Three times a day (TID) | SUBCUTANEOUS | Status: DC
  Administered 2018-02-23 – 2018-02-24 (×2): 8 [IU] via SUBCUTANEOUS

## 2018-02-23 MED ORDER — CYCLOBENZAPRINE HCL 10 MG PO TABS
10.0000 mg | ORAL_TABLET | Freq: Once | ORAL | Status: AC
  Administered 2018-02-23: 10 mg via ORAL
  Filled 2018-02-23: qty 1

## 2018-02-23 MED ORDER — INSULIN GLARGINE 100 UNIT/ML ~~LOC~~ SOLN
90.0000 [IU] | Freq: Every day | SUBCUTANEOUS | Status: DC
  Administered 2018-02-24 – 2018-02-26 (×3): 90 [IU] via SUBCUTANEOUS
  Filled 2018-02-23 (×3): qty 0.9

## 2018-02-23 NOTE — Progress Notes (Addendum)
Inpatient Diabetes Program Recommendations  AACE/ADA: New Consensus Statement on Inpatient Glycemic Control (2015)  Target Ranges:  Prepandial:   less than 140 mg/dL      Peak postprandial:   less than 180 mg/dL (1-2 hours)      Critically ill patients:  140 - 180 mg/dL   Results for ALANE, HANSSEN (MRN [id number]) as of 02/23/2018 10:46  Ref. Range 02/22/2018 08:08 02/22/2018 11:45 02/22/2018 16:48 02/22/2018 21:20 02/23/2018 08:13  Glucose-Capillary Latest Ref Range: 65 - 99 mg/dL 343 (H) 433 (H) 498 (H) 365 (H) 351 (H)   Review of Glycemic Control  Diabetes history: DM 2 Outpatient Diabetes medications: Toujeo 80 units Daily, Victoza 1.8 Daily Current orders for Inpatient glycemic control: Novolog Moderate Correction 0-15 units tid, Novolog HS scale 0-5 units  Inpatient Diabetes Program Recommendations:    Noted home Lantus received yesterday. Glucose trend still elevated in the 300's, however IV Solumedrol is now BID. Could add more Novolog Meal coverage. Also consider increasing Novolog Correction to Resistant scale 0-20 units.  Thanks,  Tama Headings RN, MSN, BC-ADM, [facility] Inpatient Diabetes Coordinator Team Pager [phone] (8a-5p)

## 2018-02-23 NOTE — Progress Notes (Signed)
PROGRESS NOTE  [name] GLO:[id number] DOB: Sep 25, 1966 DOA: 02/21/2018 PCP: Mancel Bale, PA-C  HPI/Recap of past 24 hours:  Remain wheezing, cough, dyspnea No edema, no fever, no chest pain Blood sugar elevated    Assessment/Plan: Active Problems:   Acute bronchitis   CAP (community acquired pneumonia)   Sepsis with community acquired pneumonia, acute respiratory failure -she presented with leukocytosis, lactic acidosis, sinus tachycardia, tachypnea, hypoxia  on admission -CTA no pe, does has " RIGHT tree-in-bud infiltrates and LEFT centrilobular ground-glass nodules may be infectious or inflammatory. No focal consolidation." -blood culture no growth -respiratory panel + mycoplasam pneumoniae -d/c rocephin, continue zitrhmax, add doxycycline due to severity of symptom. Taper steroids, continue nebs, mucinex, wean oxygen  Chronic diastolic chf: -Last ef done in 2017,  -Currently appear euvolemic,  lasix held yesterday in the setting of sepsis -lopressor dose reduced, continue adjust lopressor, repeat echo -she reports some pedal edema will give lasixx1 today   Insulin dependent DM2 -a1c 8.2 -blood glucose elevated, likely due to steroids, expect quick taper steroids, -continue adjust insulin  HLD; continue home meds  OSA/ Body mass index is 46.68 kg/m. meet morbid obesity criteria Continue cpap, encourage weight loss   Tobacco use disorder Tobacco cessation counseling at bedside Nicotine patch as needed  Generalized anxiety/chronic depression Continue home medications On Valium twice daily as needed IV Ativan daily as needed     Code Status: full  Family Communication: patient   Disposition Plan: home in 1-2 days, pending respiratory status, blood glucose control   Consultants:  Diabetes coordinator  Procedures:  none  Antibiotics:  As above   Objective: BP (!) 161/71   Pulse 76   Temp 98.4 F (36.9 C) (Oral)   Resp 20    Ht 5\' 2"  (1.575 m)   Wt 115.8 kg (255 lb 3.2 oz)   LMP 02/09/2018   SpO2 95%   BMI 46.68 kg/m   Intake/Output Summary (Last 24 hours) at 02/23/2018 1348 Last data filed at 02/23/2018 0500 Gross per 24 hour  Intake 350 ml  Output 600 ml  Net -250 ml   Filed Weights   02/21/18 1742 02/22/18 0406 02/23/18 0600  Weight: 114.2 kg (251 lb 12.2 oz) 114.4 kg (252 lb 3.3 oz) 115.8 kg (255 lb 3.2 oz)    Exam: Patient is examined daily including today on 02/23/2018, exams remain the same as of yesterday except that has changed    General:  NAD  Cardiovascular: sinus tachycardia has resolved  Respiratory: diffuse bilateral wheezing,   Abdomen: Soft/ND/NT, positive BS  Musculoskeletal: No Edema  Neuro: alert, oriented   Data Reviewed: Basic Metabolic Panel: Recent Labs  Lab 02/21/18 1111 02/22/18 0329 02/22/18 1700 02/23/18 0424  NA 135 139 137 137  K 3.0* 3.8 4.1 5.0  CL 101 106 105 107  CO2 20* 20* 21* 21*  GLUCOSE 360* 341* 462* 338*  BUN 9 10 13 17   CREATININE 0.78 0.79 0.96 0.76  CALCIUM 8.4* 8.7* 8.5* 8.7*  MG  --  2.3  --  2.1   Liver Function Tests: No results for input(s): AST, ALT, ALKPHOS, BILITOT, PROT, ALBUMIN in the last 168 hours. No results for input(s): LIPASE, AMYLASE in the last 168 hours. No results for input(s): AMMONIA in the last 168 hours. CBC: Recent Labs  Lab 02/21/18 1111 02/22/18 0329 02/23/18 0424  WBC 14.0* 17.8* 24.6*  NEUTROABS 9.3*  --   --   HGB 13.4 11.9* 11.8*  HCT 37.6  36.0 34.5*  MCV 88.7 91.8 94.0  PLT 257 287 292   Cardiac Enzymes:   Recent Labs  Lab 02/21/18 1111  TROPONINI <0.03   BNP (last 3 results) No results for input(s): BNP in the last 8760 hours.  ProBNP (last 3 results) No results for input(s): PROBNP in the last 8760 hours.  CBG: Recent Labs  Lab 02/22/18 1145 02/22/18 1648 02/22/18 2120 02/23/18 0813 02/23/18 1142  GLUCAP 433* 498* 365* 351* 350*    Recent Results (from the past 240  hour(s))  Blood Culture (routine x 2)     Status: None (Preliminary result)   Collection Time: 02/21/18 11:50 AM  Result Value Ref Range Status   Specimen Description   Final    RIGHT ANTECUBITAL Performed at [facility], [address] [place], [place] [address]    Special Requests   Final    BOTTLES DRAWN AEROBIC AND ANAEROBIC Blood Culture adequate volume Performed at [facility], [address] [place], [place] [address]    Culture   Final    NO GROWTH 1 DAY Performed at [facility], [place] [address], [place], [place] [address]    Report Status PENDING  Incomplete  Blood Culture (routine x 2)     Status: None (Preliminary result)   Collection Time: 02/21/18 12:00 PM  Result Value Ref Range Status   Specimen Description   Final    BLOOD LEFT FOREARM Performed at [facility], [address] [place], [place] [address]    Special Requests   Final    BOTTLES DRAWN AEROBIC AND ANAEROBIC Blood Culture adequate volume Performed at [facility], [address] [place], [place] [address]    Culture   Final    NO GROWTH 1 DAY Performed at [facility], [place] [address], [place], [place] [address]    Report Status PENDING  Incomplete  Respiratory Panel by PCR     Status: Abnormal   Collection Time: 02/22/18  7:05 AM  Result Value Ref Range Status   Adenovirus NOT DETECTED NOT DETECTED Final   Coronavirus 229E NOT DETECTED NOT DETECTED Final   Coronavirus HKU1 NOT DETECTED NOT DETECTED Final   Coronavirus NL63 NOT DETECTED NOT DETECTED Final   Coronavirus OC43 NOT DETECTED NOT DETECTED Final   Metapneumovirus NOT DETECTED NOT DETECTED Final   Rhinovirus / Enterovirus NOT DETECTED NOT DETECTED Final   Influenza A NOT DETECTED NOT DETECTED Final   Influenza B NOT DETECTED NOT DETECTED Final   Parainfluenza Virus 1 NOT DETECTED NOT DETECTED Final   Parainfluenza Virus 2 NOT DETECTED NOT DETECTED Final   Parainfluenza Virus  3 NOT DETECTED NOT DETECTED Final   Parainfluenza Virus 4 NOT DETECTED NOT DETECTED Final   Respiratory Syncytial Virus NOT DETECTED NOT DETECTED Final   Bordetella pertussis NOT DETECTED NOT DETECTED Final   Chlamydophila pneumoniae NOT DETECTED NOT DETECTED Final   Mycoplasma pneumoniae DETECTED (A) NOT DETECTED Final     Studies: No results found.  Scheduled Meds: . atorvastatin  40 mg Oral Daily  . azithromycin  500 mg Oral Daily  . celecoxib  200 mg Oral Daily  . doxycycline  100 mg Oral Q12H  . enoxaparin (LOVENOX) injection  40 mg Subcutaneous Q24H  . FLUoxetine  20 mg Oral BID  . guaiFENesin  1,200 mg Oral BID  . insulin aspart  0-15 Units Subcutaneous TID WC  .  insulin aspart  0-5 Units Subcutaneous QHS  . insulin aspart  5 Units Subcutaneous TID WC  . insulin glargine  80 Units Subcutaneous Daily  . ipratropium-albuterol  3 mL Nebulization TID  . methylPREDNISolone (SOLU-MEDROL) injection  60 mg Intravenous Q12H  . metoprolol tartrate  12.5 mg Oral BID  . sodium chloride HYPERTONIC  4 mL Nebulization TID    Continuous Infusions: . sodium chloride       Time spent: 50mins I have personally reviewed and interpreted on  02/23/2018 daily labs, tele strips, imagings as discussed above under date review session and assessment and plans.  I reviewed all nursing notes, pharmacy notes,  vitals, pertinent old records  I have discussed plan of care as described above with RN , patient on 02/23/2018   Florencia Reasons MD, PhD  [facility] Pager [phone]. If 7PM-7AM, please contact night-coverage at [email or web address], password [facility] 02/23/2018, 1:48 PM  LOS: 2 days

## 2018-02-23 NOTE — Progress Notes (Signed)
  Echocardiogram 2D Echocardiogram has been performed.  Johny Chess 02/23/2018, 11:17 AM

## 2018-02-24 LAB — BASIC METABOLIC PANEL
ANION GAP: 8 (ref 5–15)
BUN: 24 mg/dL — ABNORMAL HIGH (ref 6–20)
CO2: 25 mmol/L (ref 22–32)
CREATININE: 0.9 mg/dL (ref 0.44–1.00)
Calcium: 8.7 mg/dL — ABNORMAL LOW (ref 8.9–10.3)
Chloride: 103 mmol/L (ref 101–111)
Glucose, Bld: 354 mg/dL — ABNORMAL HIGH (ref 65–99)
Potassium: 4.1 mmol/L (ref 3.5–5.1)
Sodium: 136 mmol/L (ref 135–145)

## 2018-02-24 LAB — GLUCOSE, CAPILLARY
GLUCOSE-CAPILLARY: 309 mg/dL — AB (ref 65–99)
GLUCOSE-CAPILLARY: 327 mg/dL — AB (ref 65–99)
Glucose-Capillary: 422 mg/dL — ABNORMAL HIGH (ref 65–99)
Glucose-Capillary: 456 mg/dL — ABNORMAL HIGH (ref 65–99)
Glucose-Capillary: 418 mg/dL — ABNORMAL HIGH (ref 65–99)

## 2018-02-24 LAB — LACTIC ACID, PLASMA: Lactic Acid, Venous: 2.4 mmol/L (ref 0.5–1.9)

## 2018-02-24 MED ORDER — INSULIN ASPART 100 UNIT/ML ~~LOC~~ SOLN
15.0000 [IU] | Freq: Once | SUBCUTANEOUS | Status: AC
  Administered 2018-02-24: 15 [IU] via SUBCUTANEOUS

## 2018-02-24 MED ORDER — IPRATROPIUM-ALBUTEROL 0.5-2.5 (3) MG/3ML IN SOLN
3.0000 mL | Freq: Three times a day (TID) | RESPIRATORY_TRACT | Status: DC
  Administered 2018-02-24 – 2018-02-27 (×10): 3 mL via RESPIRATORY_TRACT
  Filled 2018-02-24 (×10): qty 3

## 2018-02-24 MED ORDER — INSULIN GLARGINE 100 UNIT/ML ~~LOC~~ SOLN
20.0000 [IU] | Freq: Every day | SUBCUTANEOUS | Status: DC
  Administered 2018-02-24 – 2018-02-26 (×3): 20 [IU] via SUBCUTANEOUS
  Filled 2018-02-24 (×3): qty 0.2

## 2018-02-24 MED ORDER — INSULIN ASPART 100 UNIT/ML ~~LOC~~ SOLN
10.0000 [IU] | Freq: Three times a day (TID) | SUBCUTANEOUS | Status: DC
Start: 2018-02-24 — End: 2018-02-26
  Administered 2018-02-24 – 2018-02-26 (×7): 10 [IU] via SUBCUTANEOUS

## 2018-02-24 MED ORDER — MAGNESIUM SULFATE 2 GM/50ML IV SOLN
2.0000 g | Freq: Once | INTRAVENOUS | Status: AC
  Administered 2018-02-24: 2 g via INTRAVENOUS
  Filled 2018-02-24: qty 50

## 2018-02-24 MED ORDER — FUROSEMIDE 10 MG/ML IJ SOLN
40.0000 mg | Freq: Once | INTRAMUSCULAR | Status: AC
  Administered 2018-02-24: 40 mg via INTRAVENOUS
  Filled 2018-02-24: qty 4

## 2018-02-24 MED ORDER — INSULIN ASPART 100 UNIT/ML ~~LOC~~ SOLN
6.0000 [IU] | Freq: Once | SUBCUTANEOUS | Status: AC
  Administered 2018-02-24: 6 [IU] via SUBCUTANEOUS

## 2018-02-24 MED ORDER — IPRATROPIUM-ALBUTEROL 0.5-2.5 (3) MG/3ML IN SOLN: 3.0000 mL | Freq: Four times a day (QID) | RESPIRATORY_TRACT | Status: DC

## 2018-02-24 NOTE — Progress Notes (Signed)
Pt blood sugar 456 1 hour after administering 25 units of insulin. RN notified Dr. Erlinda Hong and received order to give additional 15 units of insulin. RN administered insulin and will report to night shift RN.

## 2018-02-24 NOTE — Progress Notes (Signed)
PROGRESS NOTE  [name] ZOX:[id number] DOB: 11-06-1965 DOA: 02/21/2018 PCP: Mancel Bale, PA-C  HPI/Recap of past 24 hours:  Remain wheezing, cough, dyspnea, now cough is more productive, report sputum is green color No edema, no fever, no chest pain Blood sugar remain elevated    Assessment/Plan: Active Problems:   Acute bronchitis   CAP (community acquired pneumonia)   Sepsis with community acquired pneumonia, acute respiratory failure -she presented with leukocytosis, lactic acidosis, sinus tachycardia, tachypnea, hypoxia  on admission -CTA no pe, does has " RIGHT tree-in-bud infiltrates and LEFT centrilobular ground-glass nodules may be infectious or inflammatory. No focal consolidation." -blood culture no growth -respiratory panel + mycoplasam pneumoniae -d/c rocephin, continue zitrhmax, add doxycycline due to severity of symptom. Taper steroids, continue nebs, mucinex, wean oxygen  Chronic diastolic chf: -Last ef done in 2017,  -Currently appear euvolemic,  lasix held yesterday in the setting of sepsis -lopressor dose reduced, continue adjust lopressor, repeat echo -will give another dose of lasix today   Insulin dependent DM2 -a1c 8.2 -blood glucose elevated, likely due to steroids, expect quick taper steroids, -continue adjust insulin  HLD; continue home meds  OSA/ Body mass index is 46.45 kg/m. meet morbid obesity criteria Continue cpap, encourage weight loss   Tobacco use disorder Tobacco cessation counseling at bedside Nicotine patch as needed  Generalized anxiety/chronic depression Continue home medications On Valium twice daily as needed IV Ativan daily as needed     Code Status: full  Family Communication: patient   Disposition Plan: not ready to discharge, pending respiratory status, blood glucose control   Consultants:  Diabetes coordinator  Procedures:  none  Antibiotics:  As above   Objective: BP (!) 149/78  (BP Location: Left Arm)   Pulse (!) 50   Temp 97.7 F (36.5 C) (Oral)   Resp 20   Ht 5\' 2"  (1.575 m)   Wt 115.2 kg (253 lb 15.5 oz)   LMP 02/09/2018   SpO2 98%   BMI 46.45 kg/m   Intake/Output Summary (Last 24 hours) at 02/24/2018 0844 Last data filed at 02/23/2018 2200 Gross per 24 hour  Intake -  Output 2300 ml  Net -2300 ml   Filed Weights   02/22/18 0406 02/23/18 0600 02/24/18 0500  Weight: 114.4 kg (252 lb 3.3 oz) 115.8 kg (255 lb 3.2 oz) 115.2 kg (253 lb 15.5 oz)    Exam: Patient is examined daily including today on 02/24/2018, exams remain the same as of yesterday except that has changed    General:  NAD  Cardiovascular: sinus tachycardia has resolved  Respiratory: diffuse bilateral wheezing,   Abdomen: Soft/ND/NT, positive BS  Musculoskeletal: No Edema  Neuro: alert, oriented   Data Reviewed: Basic Metabolic Panel: Recent Labs  Lab 02/21/18 1111 02/22/18 0329 02/22/18 1700 02/23/18 0424 02/24/18 0336  NA 135 139 137 137 136  K 3.0* 3.8 4.1 5.0 4.1  CL 101 106 105 107 103  CO2 20* 20* 21* 21* 25  GLUCOSE 360* 341* 462* 338* 354*  BUN 9 10 13 17  24*  CREATININE 0.78 0.79 0.96 0.76 0.90  CALCIUM 8.4* 8.7* 8.5* 8.7* 8.7*  MG  --  2.3  --  2.1  --    Liver Function Tests: No results for input(s): AST, ALT, ALKPHOS, BILITOT, PROT, ALBUMIN in the last 168 hours. No results for input(s): LIPASE, AMYLASE in the last 168 hours. No results for input(s): AMMONIA in the last 168 hours. CBC: Recent Labs  Lab 02/21/18 1111  02/22/18 0329 02/23/18 0424  WBC 14.0* 17.8* 24.6*  NEUTROABS 9.3*  --   --   HGB 13.4 11.9* 11.8*  HCT 37.6 36.0 34.5*  MCV 88.7 91.8 94.0  PLT 257 287 292   Cardiac Enzymes:   Recent Labs  Lab 02/21/18 1111  TROPONINI <0.03   BNP (last 3 results) No results for input(s): BNP in the last 8760 hours.  ProBNP (last 3 results) No results for input(s): PROBNP in the last 8760 hours.  CBG: Recent Labs  Lab 02/23/18 0813  02/23/18 1142 02/23/18 1641 02/23/18 2157 02/24/18 0726  GLUCAP 351* 350* 325* 338* 309*    Recent Results (from the past 240 hour(s))  Blood Culture (routine x 2)     Status: None (Preliminary result)   Collection Time: 02/21/18 11:50 AM  Result Value Ref Range Status   Specimen Description   Final    RIGHT ANTECUBITAL Performed at [facility], [address] [place], [place] [address]    Special Requests   Final    BOTTLES DRAWN AEROBIC AND ANAEROBIC Blood Culture adequate volume Performed at [facility], [address] [place], [place] [address]    Culture   Final    NO GROWTH 2 DAYS Performed at [facility], [place] [address] [place], [place] [address]    Report Status PENDING  Incomplete  Blood Culture (routine x 2)     Status: None (Preliminary result)   Collection Time: 02/21/18 12:00 PM  Result Value Ref Range Status   Specimen Description   Final    BLOOD LEFT FOREARM Performed at [facility], [address] [place], [place] [address]    Special Requests   Final    BOTTLES DRAWN AEROBIC AND ANAEROBIC Blood Culture adequate volume Performed at [facility], [address] [place], [place] [address]    Culture   Final    NO GROWTH 2 DAYS Performed at [facility], [place] [address] [place], [place] [address]    Report Status PENDING  Incomplete  Respiratory Panel by PCR     Status: Abnormal   Collection Time: 02/22/18  7:05 AM  Result Value Ref Range Status   Adenovirus NOT DETECTED NOT DETECTED Final   Coronavirus 229E NOT DETECTED NOT DETECTED Final   Coronavirus HKU1 NOT DETECTED NOT DETECTED Final   Coronavirus NL63 NOT DETECTED NOT DETECTED Final   Coronavirus OC43 NOT DETECTED NOT DETECTED Final   Metapneumovirus NOT DETECTED NOT DETECTED Final   Rhinovirus / Enterovirus NOT DETECTED NOT DETECTED Final   Influenza A NOT DETECTED NOT DETECTED Final   Influenza B NOT DETECTED NOT DETECTED Final     Parainfluenza Virus 1 NOT DETECTED NOT DETECTED Final   Parainfluenza Virus 2 NOT DETECTED NOT DETECTED Final   Parainfluenza Virus 3 NOT DETECTED NOT DETECTED Final   Parainfluenza Virus 4 NOT DETECTED NOT DETECTED Final   Respiratory Syncytial Virus NOT DETECTED NOT DETECTED Final   Bordetella pertussis NOT DETECTED NOT DETECTED Final   Chlamydophila pneumoniae NOT DETECTED NOT DETECTED Final   Mycoplasma pneumoniae DETECTED (A) NOT DETECTED Final     Studies: No results found.  Scheduled Meds: . atorvastatin  40 mg Oral Daily  . azithromycin  500 mg Oral Daily  . celecoxib  200 mg Oral Daily  . doxycycline  100 mg Oral Q12H  . enoxaparin (LOVENOX) injection  40 mg Subcutaneous Q24H  .  FLUoxetine  20 mg Oral BID  . guaiFENesin  1,200 mg Oral BID  . insulin aspart  0-15 Units Subcutaneous TID WC  . insulin aspart  0-5 Units Subcutaneous QHS  . insulin aspart  10 Units Subcutaneous TID WC  . insulin glargine  90 Units Subcutaneous Daily  . ipratropium-albuterol  3 mL Nebulization TID  . methylPREDNISolone (SOLU-MEDROL) injection  60 mg Intravenous Q12H  . metoprolol tartrate  12.5 mg Oral BID  . sodium chloride HYPERTONIC  4 mL Nebulization TID    Continuous Infusions: . sodium chloride       Time spent: 61mins I have personally reviewed and interpreted on  02/24/2018 daily labs, tele strips, imagings as discussed above under date review session and assessment and plans.  I reviewed all nursing notes, pharmacy notes,  vitals, pertinent old records  I have discussed plan of care as described above with RN , patient on 02/24/2018   Florencia Reasons MD, PhD  [facility] Pager [phone]. If 7PM-7AM, please contact night-coverage at [email or web address], password [facility] 02/24/2018, 8:44 AM  LOS: 3 days

## 2018-02-24 NOTE — Progress Notes (Signed)
CRITICAL VALUE ALERT  Critical Value: Lactic acid 2.4  Date & Time Notied: 02/24/18 0455   Provider Notified: On call NP [place]  Orders Received/Actions taken: Awaiting new orders

## 2018-02-24 NOTE — Progress Notes (Signed)
RN notified by nurse tech that pt's blood sugar was 422. RN immediately notified Dr. Erlinda Hong. Will await for orders and continue to monitor pt.

## 2018-02-24 NOTE — Care Management Note (Signed)
Case Management Note  Patient Details  Name: [name] MRN: [id number] Date of Birth: [birth year]  Subjective/Objective:     From home, presents with acute bronchitis, septic, ambulatory, on iv solumedrol.                Action/Plan: DC home when ready.  Expected Discharge Date:                  Expected Discharge Plan:  Home/Self Care  In-House Referral:     Discharge planning Services     Post Acute Care Choice:    Choice offered to:     DME Arranged:    DME Agency:     HH Arranged:    HH Agency:     Status of Service:  In process, will continue to follow  If discussed at Long Length of Stay Meetings, dates discussed:    Additional Comments:  Zenon Mayo, RN 02/24/2018, 4:01 PM

## 2018-02-25 LAB — BASIC METABOLIC PANEL
ANION GAP: 10 (ref 5–15)
BUN: 23 mg/dL — AB (ref 6–20)
CALCIUM: 8.9 mg/dL (ref 8.9–10.3)
CO2: 26 mmol/L (ref 22–32)
Chloride: 101 mmol/L (ref 101–111)
Creatinine, Ser: 0.8 mg/dL (ref 0.44–1.00)
GFR calc Af Amer: >60 mL/min (ref >60)
Glucose, Bld: 195 mg/dL — ABNORMAL HIGH (ref 65–99)
Potassium: 4 mmol/L (ref 3.5–5.1)
Sodium: 137 mmol/L (ref 135–145)

## 2018-02-25 LAB — GLUCOSE, CAPILLARY
GLUCOSE-CAPILLARY: 252 mg/dL — AB (ref 65–99)
GLUCOSE-CAPILLARY: 452 mg/dL — AB (ref 65–99)
GLUCOSE-CAPILLARY: 459 mg/dL — AB (ref 65–99)
GLUCOSE-CAPILLARY: 383 mg/dL — AB (ref 65–99)
Glucose-Capillary: 203 mg/dL — ABNORMAL HIGH (ref 65–99)

## 2018-02-25 MED ORDER — FUROSEMIDE 10 MG/ML IJ SOLN
40.0000 mg | Freq: Once | INTRAMUSCULAR | Status: AC
  Administered 2018-02-25: 40 mg via INTRAVENOUS
  Filled 2018-02-25: qty 4

## 2018-02-25 MED ORDER — FLUCONAZOLE 150 MG PO TABS
150.0000 mg | ORAL_TABLET | Freq: Once | ORAL | Status: AC
  Administered 2018-02-25: 150 mg via ORAL
  Filled 2018-02-25: qty 1

## 2018-02-25 MED ORDER — INSULIN ASPART 100 UNIT/ML ~~LOC~~ SOLN
15.0000 [IU] | Freq: Once | SUBCUTANEOUS | Status: AC
  Administered 2018-02-25: 15 [IU] via SUBCUTANEOUS

## 2018-02-25 NOTE — Progress Notes (Signed)
SATURATION QUALIFICATIONS: (This note is used to comply with regulatory documentation for home oxygen)  Patient Saturations on Room Air at Rest = 94%  Patient Saturations on Room Air while Ambulating = 93%  Patient Saturations on 1 Liters of oxygen while Ambulating = 94%  Please briefly explain why patient needs home oxygen: Patient  did c/o having shortness of breath while walking without oxygen.

## 2018-02-25 NOTE — Progress Notes (Signed)
PROGRESS NOTE  [name] VOJ:[id number] DOB: 04/28/1966 DOA: 02/21/2018 PCP: Mancel Bale, PA-C  HPI/Recap of past 24 hours:  Remain wheezing, maybe slightly better, continue to have productive cough, dyspnea,  She think lasix helped, would like to have another dose today She wants something for yeast infection ( I get yeast infection every time I am on antibiotics) No edema, no fever, no chest pain Blood sugar remain elevated    Assessment/Plan: Active Problems:   Acute bronchitis   CAP (community acquired pneumonia)   Sepsis with community acquired pneumonia, acute respiratory failure -she presented with leukocytosis, lactic acidosis, sinus tachycardia, tachypnea, hypoxia  on admission -CTA no pe, does has " RIGHT tree-in-bud infiltrates and LEFT centrilobular ground-glass nodules may be infectious or inflammatory. No focal consolidation." -blood culture no growth -respiratory panel + mycoplasam pneumoniae -d/c rocephin, continue zitrhmax, add doxycycline due to severity of symptom. Taper steroids, continue nebs, mucinex, wean oxygen  Chronic diastolic chf: -Last ef done in 2017,  -Currently appear euvolemic,  lasix held yesterday in the setting of sepsis -lopressor dose reduced, continue adjust lopressor, repeat echo -iv lasix on 5/30, 31, will give another dose of lasix today   Insulin dependent DM2 -a1c 8.2 -blood glucose elevated, likely due to steroids, remain on fairly high dose steroids due to persistent wheezing -add night time lantus, increase am lantus and meal coverage, continue ssi achs. Diabetes coordinator input appreciated  HLD; continue home meds  OSA/ Body mass index is 46.41 kg/m. meet morbid obesity criteria Continue cpap, encourage weight loss   Tobacco use disorder Tobacco cessation counseling at bedside Nicotine patch as needed  Generalized anxiety/chronic depression Continue home medications On Valium twice daily as needed IV  Ativan daily as needed     Code Status: full  Family Communication: patient   Disposition Plan: not ready to discharge, pending respiratory status, blood glucose control   Consultants:  Diabetes coordinator  Procedures:  none  Antibiotics:  As above   Objective: BP (!) 149/78 (BP Location: Right Arm)   Pulse 64   Temp 97.8 F (36.6 C) (Oral)   Resp 20   Ht 5\' 2"  (1.575 m)   Wt 115.1 kg (253 lb 12 oz)   LMP 02/09/2018   SpO2 98%   BMI 46.41 kg/m   Intake/Output Summary (Last 24 hours) at 02/25/2018 0736 Last data filed at 02/25/2018 0557 Gross per 24 hour  Intake 290 ml  Output 1401 ml  Net -1111 ml   Filed Weights   02/23/18 0600 02/24/18 0500 02/25/18 0443  Weight: 115.8 kg (255 lb 3.2 oz) 115.2 kg (253 lb 15.5 oz) 115.1 kg (253 lb 12 oz)    Exam: Patient is examined daily including today on 02/25/2018, exams remain the same as of yesterday except that has changed    General:  NAD  Cardiovascular: sinus tachycardia has resolved  Respiratory: diffuse bilateral wheezing,   Abdomen: Soft/ND/NT, positive BS  Musculoskeletal: No Edema  Neuro: alert, oriented   Data Reviewed: Basic Metabolic Panel: Recent Labs  Lab 02/22/18 0329 02/22/18 1700 02/23/18 0424 02/24/18 0336 02/25/18 0439  NA 139 137 137 136 137  K 3.8 4.1 5.0 4.1 4.0  CL 106 105 107 103 101  CO2 20* 21* 21* 25 26  GLUCOSE 341* 462* 338* 354* 195*  BUN 10 13 17  24* 23*  CREATININE 0.79 0.96 0.76 0.90 0.80  CALCIUM 8.7* 8.5* 8.7* 8.7* 8.9  MG 2.3  --  2.1  --   --  Liver Function Tests: No results for input(s): AST, ALT, ALKPHOS, BILITOT, PROT, ALBUMIN in the last 168 hours. No results for input(s): LIPASE, AMYLASE in the last 168 hours. No results for input(s): AMMONIA in the last 168 hours. CBC: Recent Labs  Lab 02/21/18 1111 02/22/18 0329 02/23/18 0424  WBC 14.0* 17.8* 24.6*  NEUTROABS 9.3*  --   --   HGB 13.4 11.9* 11.8*  HCT 37.6 36.0 34.5*  MCV 88.7 91.8 94.0    PLT 257 287 292   Cardiac Enzymes:   Recent Labs  Lab 02/21/18 1111  TROPONINI <0.03   BNP (last 3 results) No results for input(s): BNP in the last 8760 hours.  ProBNP (last 3 results) No results for input(s): PROBNP in the last 8760 hours.  CBG: Recent Labs  Lab 02/24/18 1205 02/24/18 1726 02/24/18 1856 02/24/18 2113 02/25/18 0716  GLUCAP 327* 422* 456* 418* 203*    Recent Results (from the past 240 hour(s))  Blood Culture (routine x 2)     Status: None (Preliminary result)   Collection Time: 02/21/18 11:50 AM  Result Value Ref Range Status   Specimen Description   Final    RIGHT ANTECUBITAL Performed at [facility], [address] [place], [place] [address]    Special Requests   Final    BOTTLES DRAWN AEROBIC AND ANAEROBIC Blood Culture adequate volume Performed at [facility], [address] [place], [place] [address]    Culture   Final    NO GROWTH 3 DAYS Performed at [facility], [place] [address] [place], [place] [address]    Report Status PENDING  Incomplete  Blood Culture (routine x 2)     Status: None (Preliminary result)   Collection Time: 02/21/18 12:00 PM  Result Value Ref Range Status   Specimen Description   Final    BLOOD LEFT FOREARM Performed at [facility], [address] [place], [place] [address]    Special Requests   Final    BOTTLES DRAWN AEROBIC AND ANAEROBIC Blood Culture adequate volume Performed at [facility], [address] [place], [place] [address]    Culture   Final    NO GROWTH 3 DAYS Performed at [facility], [place] [address], [place], [place] [address]    Report Status PENDING  Incomplete  Respiratory Panel by PCR     Status: Abnormal   Collection Time: 02/22/18  7:05 AM  Result Value Ref Range Status   Adenovirus NOT DETECTED NOT DETECTED Final   Coronavirus 229E NOT DETECTED NOT DETECTED Final   Coronavirus HKU1 NOT DETECTED NOT DETECTED Final    Coronavirus NL63 NOT DETECTED NOT DETECTED Final   Coronavirus OC43 NOT DETECTED NOT DETECTED Final   Metapneumovirus NOT DETECTED NOT DETECTED Final   Rhinovirus / Enterovirus NOT DETECTED NOT DETECTED Final   Influenza A NOT DETECTED NOT DETECTED Final   Influenza B NOT DETECTED NOT DETECTED Final   Parainfluenza Virus 1 NOT DETECTED NOT DETECTED Final   Parainfluenza Virus 2 NOT DETECTED NOT DETECTED Final   Parainfluenza Virus 3 NOT DETECTED NOT DETECTED Final   Parainfluenza Virus 4 NOT DETECTED NOT DETECTED Final   Respiratory Syncytial Virus NOT DETECTED NOT DETECTED Final   Bordetella pertussis NOT DETECTED NOT DETECTED Final   Chlamydophila pneumoniae NOT DETECTED NOT DETECTED Final   Mycoplasma pneumoniae DETECTED (A) NOT DETECTED Final     Studies: No results  found.  Scheduled Meds: . atorvastatin  40 mg Oral Daily  . azithromycin  500 mg Oral Daily  . celecoxib  200 mg Oral Daily  . doxycycline  100 mg Oral Q12H  . enoxaparin (LOVENOX) injection  40 mg Subcutaneous Q24H  . FLUoxetine  20 mg Oral BID  . guaiFENesin  1,200 mg Oral BID  . insulin aspart  0-15 Units Subcutaneous TID WC  . insulin aspart  0-5 Units Subcutaneous QHS  . insulin aspart  10 Units Subcutaneous TID WC  . insulin glargine  20 Units Subcutaneous QHS  . insulin glargine  90 Units Subcutaneous Daily  . ipratropium-albuterol  3 mL Nebulization TID  . methylPREDNISolone (SOLU-MEDROL) injection  60 mg Intravenous Q12H  . metoprolol tartrate  12.5 mg Oral BID    Continuous Infusions: . sodium chloride       Time spent: 82mins I have personally reviewed and interpreted on  02/25/2018 daily labs, tele strips, imagings as discussed above under date review session and assessment and plans.  I reviewed all nursing notes, pharmacy notes,  vitals, pertinent old records  I have discussed plan of care as described above with RN , patient on 02/25/2018   Florencia Reasons MD, PhD  [facility] Pager  [phone]. If 7PM-7AM, please contact night-coverage at [email or web address], password [facility] 02/25/2018, 7:36 AM  LOS: 4 days

## 2018-02-25 NOTE — Progress Notes (Signed)
02/25/2018 Patient blood sugar at 1905 was 459 Dr Erlinda Hong was made aware via amion. Per MD give Novolog 15 units times one [facility].

## 2018-02-25 NOTE — Progress Notes (Signed)
02/25/2018 patient blood sugar was 452 at 1701. Patient receive 15 unit of Novolog  Sliding scale and 10 units meal coverage. Dr Erlinda Hong was made aware. Order was given to recheck blood sugar at 1900 and to text MD with result Rico Sheehan RN

## 2018-02-26 LAB — BASIC METABOLIC PANEL
Anion gap: 8 (ref 5–15)
BUN: 27 mg/dL — ABNORMAL HIGH (ref 6–20)
CALCIUM: 9.1 mg/dL (ref 8.9–10.3)
CO2: 29 mmol/L (ref 22–32)
CREATININE: 0.92 mg/dL (ref 0.44–1.00)
Chloride: 100 mmol/L — ABNORMAL LOW (ref 101–111)
GFR calc non Af Amer: >60 mL/min (ref >60)
Glucose, Bld: 178 mg/dL — ABNORMAL HIGH (ref 65–99)
Potassium: 4.5 mmol/L (ref 3.5–5.1)
SODIUM: 137 mmol/L (ref 135–145)

## 2018-02-26 LAB — MAGNESIUM: MAGNESIUM: 2 mg/dL (ref 1.7–2.4)

## 2018-02-26 LAB — CULTURE, BLOOD (ROUTINE X 2)
CULTURE: NO GROWTH
Culture: NO GROWTH
SPECIAL REQUESTS: ADEQUATE
Special Requests: ADEQUATE

## 2018-02-26 LAB — GLUCOSE, CAPILLARY
GLUCOSE-CAPILLARY: 306 mg/dL — AB (ref 65–99)
GLUCOSE-CAPILLARY: 388 mg/dL — AB (ref 65–99)
Glucose-Capillary: 149 mg/dL — ABNORMAL HIGH (ref 65–99)
Glucose-Capillary: 185 mg/dL — ABNORMAL HIGH (ref 65–99)

## 2018-02-26 LAB — LACTIC ACID, PLASMA: LACTIC ACID, VENOUS: 1.6 mmol/L (ref 0.5–1.9)

## 2018-02-26 MED ORDER — KETOROLAC TROMETHAMINE 30 MG/ML IJ SOLN
30.0000 mg | Freq: Once | INTRAMUSCULAR | Status: AC
  Administered 2018-02-26: 30 mg via INTRAVENOUS
  Filled 2018-02-26: qty 1

## 2018-02-26 MED ORDER — INSULIN ASPART 100 UNIT/ML ~~LOC~~ SOLN
0.0000 [IU] | Freq: Three times a day (TID) | SUBCUTANEOUS | Status: DC
  Administered 2018-02-26: 15 [IU] via SUBCUTANEOUS
  Administered 2018-02-27: 4 [IU] via SUBCUTANEOUS
  Administered 2018-02-27: 20 [IU] via SUBCUTANEOUS
  Administered 2018-02-27: 4 [IU] via SUBCUTANEOUS

## 2018-02-26 MED ORDER — FUROSEMIDE 10 MG/ML IJ SOLN
40.0000 mg | Freq: Once | INTRAMUSCULAR | Status: AC
  Administered 2018-02-26: 40 mg via INTRAVENOUS
  Filled 2018-02-26: qty 4

## 2018-02-26 MED ORDER — INSULIN ASPART 100 UNIT/ML ~~LOC~~ SOLN
0.0000 [IU] | Freq: Every day | SUBCUTANEOUS | Status: DC
  Administered 2018-02-26: 5 [IU] via SUBCUTANEOUS
  Administered 2018-02-27: 3 [IU] via SUBCUTANEOUS

## 2018-02-26 MED ORDER — CYCLOBENZAPRINE HCL 10 MG PO TABS
5.0000 mg | ORAL_TABLET | Freq: Three times a day (TID) | ORAL | Status: DC | PRN
  Administered 2018-02-26 – 2018-02-27 (×2): 5 mg via ORAL
  Filled 2018-02-26 (×2): qty 1

## 2018-02-26 MED ORDER — INSULIN ASPART 100 UNIT/ML ~~LOC~~ SOLN
12.0000 [IU] | Freq: Three times a day (TID) | SUBCUTANEOUS | Status: DC
  Administered 2018-02-26 – 2018-02-27 (×4): 12 [IU] via SUBCUTANEOUS

## 2018-02-26 MED ORDER — INSULIN GLARGINE 100 UNIT/ML ~~LOC~~ SOLN
100.0000 [IU] | Freq: Every day | SUBCUTANEOUS | Status: DC
  Administered 2018-02-27: 100 [IU] via SUBCUTANEOUS
  Filled 2018-02-26 (×2): qty 1

## 2018-02-26 MED ORDER — DIAZEPAM 2 MG PO TABS
1.0000 mg | ORAL_TABLET | Freq: Two times a day (BID) | ORAL | Status: DC | PRN
  Administered 2018-02-26 – 2018-02-27 (×3): 2 mg via ORAL
  Filled 2018-02-26 (×3): qty 1

## 2018-02-26 NOTE — Progress Notes (Signed)
Inpatient Diabetes Program Recommendations  AACE/ADA: New Consensus Statement on Inpatient Glycemic Control (2015)  Target Ranges:  Prepandial:   less than 140 mg/dL      Peak postprandial:   less than 180 mg/dL (1-2 hours)      Critically ill patients:  140 - 180 mg/dL   Lab Results  Component Value Date   GLUCAP 383 (H) 02/25/2018   HGBA1C 8.2 (H) 02/22/2018    Review of Glycemic Control  Diabetes history: DM 2 Outpatient Diabetes medications: Toujeo 80 units Daily, Victoza 1.8 Daily Current orders for Inpatient glycemic control: Lantus 90 units qam, 20 units qpm, Novolog Moderate Correction 0-15 units tid, Novolog HS scale 0-5 units  Inpatient Diabetes Program Recommendations:    Fasting glucose looks better, however postprandial glucose rises above 400's. Consider increasing Novolog meal coverage to 15 units. Consider increasing Novolog Correction to Resistant scale 0-20 units.  Thanks,  Tama Headings RN, MSN, BC-ADM, [facility] Inpatient Diabetes Coordinator Team Pager [phone] (8a-5p)

## 2018-02-26 NOTE — Progress Notes (Signed)
PROGRESS NOTE  [name] NLZ:[id number] DOB: Nov 04, 1965 DOA: 02/21/2018 PCP: Mancel Bale, PA-C  HPI/Recap of past 24 hours:  Feeling better, less wheezing, less cough, she is on room air now She feels like she will benefit from another dose of lasix 40mg   No edema, no fever, no chest pain  Am fasting blood glucose improving, pm Blood sugar remain elevated    Assessment/Plan: Active Problems:   Acute bronchitis   CAP (community acquired pneumonia)   Sepsis with community acquired pneumonia, acute respiratory failure -she presented with leukocytosis, lactic acidosis, sinus tachycardia, tachypnea, hypoxia  on admission -CTA no pe, does has " RIGHT tree-in-bud infiltrates and LEFT centrilobular ground-glass nodules may be infectious or inflammatory. No focal consolidation." -blood culture no growth -respiratory panel + mycoplasam pneumoniae -d/c rocephin, continue zitrhmax, add doxycycline due to severity of symptom. Taper steroids, continue nebs, mucinex, wean oxygen  Chronic diastolic chf: -Last ef done in 2017,  -Currently appear euvolemic,  lasix held yesterday in the setting of sepsis -lopressor dose reduced, continue adjust lopressor, repeat echo with preserved lvef, no significant interval changes compare to that from 2017 -iv lasix on 5/30, 31, 6/1 and 6/2, she reports at home take lasix everyother day.     Insulin dependent DM2 -a1c 8.2 -blood glucose elevated, likely due to steroids, remain on fairly high dose steroids due to persistent wheezing -add night time lantus, increase am lantus and meal coverage, continue ssi achs. Diabetes coordinator input appreciated  HLD; continue home meds  OSA/ Body mass index is 46.25 kg/m. meet morbid obesity criteria Continue cpap, encourage weight loss   Tobacco use disorder Tobacco cessation counseling at bedside Nicotine patch as needed  Generalized anxiety/chronic depression Continue home medications On  Valium twice daily as needed IV Ativan daily as needed   Morbid obesity/oas on cpap at night Body mass index is 46.25 kg/m.    Code Status: full  Family Communication: patient   Disposition Plan: appear improving slowly, may be able to d/c Monday or Tuesday   pending respiratory status, blood glucose control   Consultants:  Diabetes coordinator  Procedures:  none  Antibiotics:  As above   Objective: BP (!) 154/82 (BP Location: Right Wrist)   Pulse 66   Temp 99.2 F (37.3 C) (Oral)   Resp 16   Ht 5\' 2"  (1.575 m)   Wt 114.7 kg (252 lb 13.9 oz)   LMP 02/09/2018   SpO2 94%   BMI 46.25 kg/m   Intake/Output Summary (Last 24 hours) at 02/26/2018 1201 Last data filed at 02/25/2018 1800 Gross per 24 hour  Intake 720 ml  Output -  Net 720 ml   Filed Weights   02/24/18 0500 02/25/18 0443 02/26/18 0417  Weight: 115.2 kg (253 lb 15.5 oz) 115.1 kg (253 lb 12 oz) 114.7 kg (252 lb 13.9 oz)    Exam: Patient is examined daily including today on 02/26/2018, exams remain the same as of yesterday except that has changed    General:  NAD  Cardiovascular: sinus tachycardia has resolved  Respiratory: diffuse bilateral wheezing is improving  Abdomen: Soft/ND/NT, positive BS  Musculoskeletal: No Edema  Neuro: alert, oriented   Data Reviewed: Basic Metabolic Panel: Recent Labs  Lab 02/22/18 0329 02/22/18 1700 02/23/18 0424 02/24/18 0336 02/25/18 0439 02/26/18 0356  NA 139 137 137 136 137 137  K 3.8 4.1 5.0 4.1 4.0 4.5  CL 106 105 107 103 101 100*  CO2 20* 21* 21* 25 26 29  GLUCOSE 341* 462* 338* 354* 195* 178*  BUN 10 13 17  24* 23* 27*  CREATININE 0.79 0.96 0.76 0.90 0.80 0.92  CALCIUM 8.7* 8.5* 8.7* 8.7* 8.9 9.1  MG 2.3  --  2.1  --   --  2.0   Liver Function Tests: No results for input(s): AST, ALT, ALKPHOS, BILITOT, PROT, ALBUMIN in the last 168 hours. No results for input(s): LIPASE, AMYLASE in the last 168 hours. No results for input(s): AMMONIA in  the last 168 hours. CBC: Recent Labs  Lab 02/21/18 1111 02/22/18 0329 02/23/18 0424  WBC 14.0* 17.8* 24.6*  NEUTROABS 9.3*  --   --   HGB 13.4 11.9* 11.8*  HCT 37.6 36.0 34.5*  MCV 88.7 91.8 94.0  PLT 257 287 292   Cardiac Enzymes:   Recent Labs  Lab 02/21/18 1111  TROPONINI <0.03   BNP (last 3 results) No results for input(s): BNP in the last 8760 hours.  ProBNP (last 3 results) No results for input(s): PROBNP in the last 8760 hours.  CBG: Recent Labs  Lab 02/25/18 1701 02/25/18 1905 02/25/18 2126 02/26/18 0756 02/26/18 1150  GLUCAP 452* 459* 383* 149* 185*    Recent Results (from the past 240 hour(s))  Blood Culture (routine x 2)     Status: None   Collection Time: 02/21/18 11:50 AM  Result Value Ref Range Status   Specimen Description   Final    RIGHT ANTECUBITAL Performed at [facility], [address] [place], [place] [address]    Special Requests   Final    BOTTLES DRAWN AEROBIC AND ANAEROBIC Blood Culture adequate volume Performed at [facility], [address] [place], [place] [address]    Culture   Final    NO GROWTH 5 DAYS Performed at [facility], [place] [address] [place], [place] [address]    Report Status 02/26/2018 FINAL  Final  Blood Culture (routine x 2)     Status: None   Collection Time: 02/21/18 12:00 PM  Result Value Ref Range Status   Specimen Description   Final    BLOOD LEFT FOREARM Performed at [facility], [address] [place], [place] [address]    Special Requests   Final    BOTTLES DRAWN AEROBIC AND ANAEROBIC Blood Culture adequate volume Performed at [facility], [address] [place], [place] [address]    Culture   Final    NO GROWTH 5 DAYS Performed at [facility], [place] [address] [place],  [address]    Report Status 02/26/2018 FINAL  Final  Respiratory Panel by PCR     Status: Abnormal   Collection Time: 02/22/18  7:05 AM  Result Value Ref  Range Status   Adenovirus NOT DETECTED NOT DETECTED Final   Coronavirus 229E NOT DETECTED NOT DETECTED Final   Coronavirus HKU1 NOT DETECTED NOT DETECTED Final   Coronavirus NL63 NOT DETECTED NOT DETECTED Final   Coronavirus OC43 NOT DETECTED NOT DETECTED Final   Metapneumovirus NOT DETECTED NOT DETECTED Final   Rhinovirus / Enterovirus NOT DETECTED NOT DETECTED Final   Influenza A NOT DETECTED NOT DETECTED Final   Influenza B NOT DETECTED NOT DETECTED Final   Parainfluenza Virus 1 NOT DETECTED NOT DETECTED Final   Parainfluenza Virus 2 NOT DETECTED NOT DETECTED Final   Parainfluenza Virus 3 NOT DETECTED NOT DETECTED Final   Parainfluenza Virus 4 NOT DETECTED NOT DETECTED Final  Respiratory Syncytial Virus NOT DETECTED NOT DETECTED Final   Bordetella pertussis NOT DETECTED NOT DETECTED Final   Chlamydophila pneumoniae NOT DETECTED NOT DETECTED Final   Mycoplasma pneumoniae DETECTED (A) NOT DETECTED Final     Studies: No results found.  Scheduled Meds: . atorvastatin  40 mg Oral Daily  . azithromycin  500 mg Oral Daily  . celecoxib  200 mg Oral Daily  . doxycycline  100 mg Oral Q12H  . enoxaparin (LOVENOX) injection  40 mg Subcutaneous Q24H  . FLUoxetine  20 mg Oral BID  . furosemide  40 mg Intravenous Once  . guaiFENesin  1,200 mg Oral BID  . insulin aspart  0-20 Units Subcutaneous TID WC  . insulin aspart  0-5 Units Subcutaneous QHS  . insulin aspart  12 Units Subcutaneous TID WC  . [START ON 02/27/2018] insulin glargine  100 Units Subcutaneous Daily  . insulin glargine  20 Units Subcutaneous QHS  . ipratropium-albuterol  3 mL Nebulization TID  . methylPREDNISolone (SOLU-MEDROL) injection  60 mg Intravenous Q12H  . metoprolol tartrate  12.5 mg Oral BID    Continuous Infusions: . sodium chloride       Time spent: 31mins I have personally reviewed and interpreted on  02/26/2018 daily labs, tele strips, imagings as discussed above under date review session and assessment  and plans.  I reviewed all nursing notes, pharmacy notes,  vitals, pertinent old records  I have discussed plan of care as described above with RN , patient on 02/26/2018   Florencia Reasons MD, PhD  [facility] Pager [phone]. If 7PM-7AM, please contact night-coverage at [email or web address], password [facility] 02/26/2018, 12:01 PM  LOS: 5 days

## 2018-02-27 LAB — BASIC METABOLIC PANEL
ANION GAP: 10 (ref 5–15)
BUN: 28 mg/dL — ABNORMAL HIGH (ref 6–20)
CHLORIDE: 99 mmol/L — AB (ref 101–111)
CO2: 31 mmol/L (ref 22–32)
Calcium: 9.6 mg/dL (ref 8.9–10.3)
Creatinine, Ser: 0.98 mg/dL (ref 0.44–1.00)
GFR calc Af Amer: >60 mL/min (ref >60)
GLUCOSE: 171 mg/dL — AB (ref 65–99)
POTASSIUM: 4.4 mmol/L (ref 3.5–5.1)
Sodium: 140 mmol/L (ref 135–145)

## 2018-02-27 LAB — GLUCOSE, CAPILLARY
GLUCOSE-CAPILLARY: 179 mg/dL — AB (ref 65–99)
GLUCOSE-CAPILLARY: 187 mg/dL — AB (ref 65–99)
GLUCOSE-CAPILLARY: 353 mg/dL — AB (ref 65–99)
GLUCOSE-CAPILLARY: 265 mg/dL — AB (ref 65–99)
Glucose-Capillary: 366 mg/dL — ABNORMAL HIGH (ref 65–99)

## 2018-02-27 LAB — CBC
HCT: 47.4 % — ABNORMAL HIGH (ref 36.0–46.0)
HEMOGLOBIN: 16 g/dL — AB (ref 12.0–15.0)
MCH: 29.9 pg (ref 26.0–34.0)
MCHC: 33.8 g/dL (ref 30.0–36.0)
MCV: 88.4 fL (ref 78.0–100.0)
Platelets: 398 10*3/uL (ref 150–400)
RBC: 5.36 MIL/uL — AB (ref 3.87–5.11)
RDW: 13.8 % (ref 11.5–15.5)
WBC: 23.2 10*3/uL — AB (ref 4.0–10.5)

## 2018-02-27 LAB — MAGNESIUM: MAGNESIUM: 2.1 mg/dL (ref 1.7–2.4)

## 2018-02-27 LAB — TSH: TSH: 0.535 u[IU]/mL (ref 0.350–4.500)

## 2018-02-27 MED ORDER — FUROSEMIDE 10 MG/ML IJ SOLN
40.0000 mg | Freq: Once | INTRAMUSCULAR | Status: AC
  Administered 2018-02-27: 40 mg via INTRAVENOUS
  Filled 2018-02-27: qty 4

## 2018-02-27 MED ORDER — PREDNISONE 20 MG PO TABS
60.0000 mg | ORAL_TABLET | Freq: Every day | ORAL | Status: DC
  Administered 2018-02-28: 60 mg via ORAL
  Filled 2018-02-27: qty 3

## 2018-02-27 NOTE — Progress Notes (Signed)
PROGRESS NOTE  [name] ION:[id number] DOB: 1966-01-25 DOA: 02/21/2018 PCP: Mancel Bale, PA-C  HPI/Recap of past 24 hours:  Feeling better, less wheezing, less cough, she is on room air now She feels like she will benefit from another dose of lasix 40mg   No edema, no fever, no chest pain  Am fasting blood glucose improving, pm Blood sugar remain elevated  She reports she wheezing almost weekly at baseline but not daily, she reports no pulmonologist    Assessment/Plan: Active Problems:   Acute bronchitis   CAP (community acquired pneumonia)   Sepsis with community acquired pneumonia, copd exacerbation, acute hypoxic respiratory failure -she presented with leukocytosis, lactic acidosis, sinus tachycardia, tachypnea, hypoxia  on admission -CTA no pe, does has " RIGHT tree-in-bud infiltrates and LEFT centrilobular ground-glass nodules may be infectious or inflammatory. No focal consolidation." -blood culture no growth -respiratory panel + mycoplasam pneumoniae -d/c rocephin, continue zitrhmax, add doxycycline due to severity of symptom.  continue nebs, mucinex,  -persistent wheezing, but has much improved, d/c iv solumedrol, change to oral prednisone -she is now off oxygen  Chronic diastolic chf: -Last ef done in 2017,  -Currently appear euvolemic,  lasix held on presentation in the setting of sepsis -lopressor dose reduced, continue adjust lopressor, repeat echo with preserved lvef, no significant interval changes compare to that from 2017 -iv lasix on 5/30, 31, 6/1, 6/2 and 6/3, she reports at home take lasix everyother day.     Insulin dependent DM2 -a1c 8.2 -blood glucose elevated, likely due to steroids,  -Taper steroids, continue to adjust insulin -Diabetes coordinator input appreciated  HLD; continue home meds  Tobacco use disorder Tobacco cessation counseling at bedside> 63mins She reports she smoke 4 cigerette a day Nicotine patch as  needed  Generalized anxiety/chronic depression Stable, denies HI/SI Continue home medications On Valium twice daily as needed IV Ativan daily as needed  OSA/ Body mass index is 45.97 kg/m. meet morbid obesity criteria Continue cpap, encourage weight loss      Code Status: full  Family Communication: patient   Disposition Plan: taper steroids, adjust insulin, hopefully d/c on Tuesday   pending respiratory status, blood glucose control   Consultants:  Diabetes coordinator  Procedures:  none  Antibiotics:  As above   Objective: BP (!) 144/75 (BP Location: Right Arm)   Pulse (!) 57   Temp 97.8 F (36.6 C) (Oral)   Resp 17   Ht 5\' 2"  (1.575 m)   Wt 114 kg (251 lb 5.2 oz)   LMP 02/09/2018   SpO2 99%   BMI 45.97 kg/m   Intake/Output Summary (Last 24 hours) at 02/27/2018 1232 Last data filed at 02/27/2018 0805 Gross per 24 hour  Intake 360 ml  Output 1200 ml  Net -840 ml   Filed Weights   02/25/18 0443 02/26/18 0417 02/27/18 0500  Weight: 115.1 kg (253 lb 12 oz) 114.7 kg (252 lb 13.9 oz) 114 kg (251 lb 5.2 oz)    Exam: Patient is examined daily including today on 02/27/2018, exams remain the same as of yesterday except that has changed    General:  NAD, obese   Cardiovascular: sinus tachycardia has resolved  Respiratory: diffuse bilateral wheezing is improving  Abdomen: Soft/ND/NT, positive BS  Musculoskeletal: No Edema  Neuro: alert, oriented   Data Reviewed: Basic Metabolic Panel: Recent Labs  Lab 02/22/18 0329  02/23/18 0424 02/24/18 0336 02/25/18 0439 02/26/18 0356 02/27/18 0743  NA 139   < > 137 136 137  137 140  K 3.8   < > 5.0 4.1 4.0 4.5 4.4  CL 106   < > 107 103 101 100* 99*  CO2 20*   < > 21* 25 26 29 31   GLUCOSE 341*   < > 338* 354* 195* 178* 171*  BUN 10   < > 17 24* 23* 27* 28*  CREATININE 0.79   < > 0.76 0.90 0.80 0.92 0.98  CALCIUM 8.7*   < > 8.7* 8.7* 8.9 9.1 9.6  MG 2.3  --  2.1  --   --  2.0 2.1   < > = values in  this interval not displayed.   Liver Function Tests: No results for input(s): AST, ALT, ALKPHOS, BILITOT, PROT, ALBUMIN in the last 168 hours. No results for input(s): LIPASE, AMYLASE in the last 168 hours. No results for input(s): AMMONIA in the last 168 hours. CBC: Recent Labs  Lab 02/21/18 1111 02/22/18 0329 02/23/18 0424 02/27/18 0743  WBC 14.0* 17.8* 24.6* 23.2*  NEUTROABS 9.3*  --   --   --   HGB 13.4 11.9* 11.8* 16.0*  HCT 37.6 36.0 34.5* 47.4*  MCV 88.7 91.8 94.0 88.4  PLT 257 287 292 398   Cardiac Enzymes:   Recent Labs  Lab 02/21/18 1111  TROPONINI <0.03   BNP (last 3 results) No results for input(s): BNP in the last 8760 hours.  ProBNP (last 3 results) No results for input(s): PROBNP in the last 8760 hours.  CBG: Recent Labs  Lab 02/26/18 1150 02/26/18 1735 02/26/18 2140 02/27/18 0808 02/27/18 1225  GLUCAP 185* 306* 388* 179* 187*    Recent Results (from the past 240 hour(s))  Blood Culture (routine x 2)     Status: None   Collection Time: 02/21/18 11:50 AM  Result Value Ref Range Status   Specimen Description   Final    RIGHT ANTECUBITAL Performed at [facility], [address] [place], [place] [address]    Special Requests   Final    BOTTLES DRAWN AEROBIC AND ANAEROBIC Blood Culture adequate volume Performed at [facility], [address] [place], [place] [address]    Culture   Final    NO GROWTH 5 DAYS Performed at [facility], [place] [address], [place], [place] [address]    Report Status 02/26/2018 FINAL  Final  Blood Culture (routine x 2)     Status: None   Collection Time: 02/21/18 12:00 PM  Result Value Ref Range Status   Specimen Description   Final    BLOOD LEFT FOREARM Performed at [facility] [address] [place], [place] [address]    Special Requests   Final    BOTTLES DRAWN AEROBIC AND ANAEROBIC Blood Culture adequate volume Performed at [facility], [address] [place], [place] [address]    Culture   Final    NO GROWTH 5 DAYS Performed at [facility], [place] [address], [place], [place] [address]    Report Status 02/26/2018 FINAL  Final  Respiratory Panel by PCR     Status: Abnormal   Collection Time: 02/22/18  7:05 AM  Result Value Ref Range Status   Adenovirus NOT DETECTED NOT DETECTED Final   Coronavirus 229E NOT DETECTED NOT DETECTED Final   Coronavirus HKU1 NOT DETECTED NOT DETECTED Final   Coronavirus NL63 NOT DETECTED NOT DETECTED Final   Coronavirus OC43 NOT DETECTED NOT DETECTED  Final   Metapneumovirus NOT DETECTED NOT DETECTED Final   Rhinovirus / Enterovirus NOT DETECTED NOT DETECTED Final   Influenza A NOT DETECTED NOT DETECTED Final   Influenza B NOT DETECTED NOT DETECTED Final   Parainfluenza Virus 1 NOT DETECTED NOT DETECTED Final   Parainfluenza Virus 2 NOT DETECTED NOT DETECTED Final   Parainfluenza Virus 3 NOT DETECTED NOT DETECTED Final   Parainfluenza Virus 4 NOT DETECTED NOT DETECTED Final   Respiratory Syncytial Virus NOT DETECTED NOT DETECTED Final   Bordetella pertussis NOT DETECTED NOT DETECTED Final   Chlamydophila pneumoniae NOT DETECTED NOT DETECTED Final   Mycoplasma pneumoniae DETECTED (A) NOT DETECTED Final     Studies: No results found.  Scheduled Meds: . atorvastatin  40 mg Oral Daily  . azithromycin  500 mg Oral Daily  . celecoxib  200 mg Oral Daily  . doxycycline  100 mg Oral Q12H  . enoxaparin (LOVENOX) injection  40 mg Subcutaneous Q24H  . FLUoxetine  20 mg Oral BID  . furosemide  40 mg Intravenous Once  . guaiFENesin  1,200 mg Oral BID  . insulin aspart  0-20 Units Subcutaneous TID WC  . insulin aspart  0-5 Units Subcutaneous QHS  . insulin aspart  12 Units Subcutaneous TID WC  . insulin glargine  100 Units Subcutaneous Daily  . insulin glargine  20 Units Subcutaneous QHS  . ipratropium-albuterol  3 mL Nebulization TID  . metoprolol tartrate  12.5 mg Oral BID  . [START ON 02/28/2018]  predniSONE  60 mg Oral Q breakfast    Continuous Infusions: . sodium chloride       Time spent: 12mins I have personally reviewed and interpreted on  02/27/2018 daily labs, tele strips, imagings as discussed above under date review session and assessment and plans.  I reviewed all nursing notes, pharmacy notes,  vitals, pertinent old records  I have discussed plan of care as described above with RN , patient on 02/27/2018   Florencia Reasons MD, PhD  [facility] Pager [phone]. If 7PM-7AM, please contact night-coverage at [email or web address], password [facility] 02/27/2018, 12:32 PM  LOS: 6 days

## 2018-02-27 NOTE — Progress Notes (Signed)
Inpatient Diabetes Program Recommendations  AACE/ADA: New Consensus Statement on Inpatient Glycemic Control (2015)  Target Ranges:  Prepandial:   less than 140 mg/dL      Peak postprandial:   less than 180 mg/dL (1-2 hours)      Critically ill patients:  140 - 180 mg/dL   Lab Results  Component Value Date   GLUCAP 187 (H) 02/27/2018   HGBA1C 8.2 (H) 02/22/2018    Review of Glycemic Control Results for [name], [name] (MRN [id number]) as of 02/27/2018 13:12  Ref. Range 02/26/2018 17:35 02/26/2018 21:40 02/27/2018 08:08 02/27/2018 12:25  Glucose-Capillary Latest Ref Range: 65 - 99 mg/dL 306 (H) 388 (H) 179 (H) 187 (H)   Diabetes history: DM 2 Outpatient Diabetes medications: Toujeo 80 units Daily, Victoza 1.8 Daily Current orders for Inpatient glycemic control: Lantus 100 units qam, 20 units qpm, Novolog Moderate Correction 0-20 units tid, Novolog HS scale 0-5 units  Inpatient Diabetes Program Recommendations:    With tapering of IV steroids to 1 dose of oral Prednisone 60 mg QAM, would consider discontinuing Lantus 20 units QHS to avoid hypoglycemia.  Thanks, Bronson Curb, MSN, RNC-OB Diabetes Coordinator [phone] (8a-5p)

## 2018-02-28 DIAGNOSIS — J440 Chronic obstructive pulmonary disease with acute lower respiratory infection: Secondary | ICD-10-CM

## 2018-02-28 DIAGNOSIS — J209 Acute bronchitis, unspecified: Secondary | ICD-10-CM

## 2018-02-28 LAB — GLUCOSE, CAPILLARY
GLUCOSE-CAPILLARY: 137 mg/dL — AB (ref 65–99)
GLUCOSE-CAPILLARY: 144 mg/dL — AB (ref 65–99)
Glucose-Capillary: 71 mg/dL (ref 65–99)

## 2018-02-28 LAB — CBC
HCT: 45 % (ref 36.0–46.0)
Hemoglobin: 15.5 g/dL — ABNORMAL HIGH (ref 12.0–15.0)
MCH: 29.6 pg (ref 26.0–34.0)
MCHC: 34.4 g/dL (ref 30.0–36.0)
MCV: 86 fL (ref 78.0–100.0)
Platelets: 385 10*3/uL (ref 150–400)
RBC: 5.23 MIL/uL — ABNORMAL HIGH (ref 3.87–5.11)
RDW: 13.8 % (ref 11.5–15.5)
WBC: 24.6 10*3/uL — ABNORMAL HIGH (ref 4.0–10.5)

## 2018-02-28 LAB — BASIC METABOLIC PANEL
Anion gap: 10 (ref 5–15)
BUN: 31 mg/dL — ABNORMAL HIGH (ref 6–20)
CO2: 25 mmol/L (ref 22–32)
Calcium: 8.8 mg/dL — ABNORMAL LOW (ref 8.9–10.3)
Chloride: 103 mmol/L (ref 101–111)
Creatinine, Ser: 0.84 mg/dL (ref 0.44–1.00)
GFR calc non Af Amer: >60 mL/min (ref >60)
GLUCOSE: 76 mg/dL (ref 65–99)
POTASSIUM: 3.7 mmol/L (ref 3.5–5.1)
Sodium: 138 mmol/L (ref 135–145)

## 2018-02-28 MED ORDER — FUROSEMIDE 20 MG PO TABS: 40.0000 mg | ORAL_TABLET | ORAL | 0 refills | Status: DC

## 2018-02-28 MED ORDER — AZITHROMYCIN 250 MG PO TABS: 250.0000 mg | ORAL_TABLET | Freq: Every day | ORAL | 0 refills | Status: AC

## 2018-02-28 MED ORDER — DOXYCYCLINE HYCLATE 100 MG PO TABS: 100.0000 mg | ORAL_TABLET | Freq: Two times a day (BID) | ORAL | 0 refills | Status: AC

## 2018-02-28 MED ORDER — FREESTYLE LIBRE 14 DAY SENSOR MISC: 2.0000 | Freq: Once | 0 refills | Status: AC

## 2018-02-28 MED ORDER — FREESTYLE LIBRE 14 DAY READER DEVI: 1.0000 | Freq: Once | 0 refills | Status: AC

## 2018-02-28 MED ORDER — ALBUTEROL SULFATE HFA 108 (90 BASE) MCG/ACT IN AERS: 2.0000 | INHALATION_SPRAY | Freq: Four times a day (QID) | RESPIRATORY_TRACT | 2 refills | Status: AC | PRN

## 2018-02-28 MED ORDER — POTASSIUM CHLORIDE CRYS ER 20 MEQ PO TBCR: 20.0000 meq | EXTENDED_RELEASE_TABLET | ORAL | 0 refills | Status: DC

## 2018-02-28 MED ORDER — INSULIN GLARGINE 100 UNIT/ML ~~LOC~~ SOLN
80.0000 [IU] | Freq: Every day | SUBCUTANEOUS | Status: DC
  Administered 2018-02-28: 80 [IU] via SUBCUTANEOUS
  Filled 2018-02-28: qty 0.8

## 2018-02-28 MED ORDER — PREDNISONE 10 MG PO TABS: ORAL_TABLET | ORAL | 0 refills | Status: DC

## 2018-02-28 MED ORDER — POTASSIUM CHLORIDE CRYS ER 20 MEQ PO TBCR
40.0000 meq | EXTENDED_RELEASE_TABLET | Freq: Once | ORAL | Status: AC
  Administered 2018-02-28: 40 meq via ORAL
  Filled 2018-02-28: qty 2

## 2018-02-28 MED ORDER — GUAIFENESIN ER 600 MG PO TB12: 1200.0000 mg | ORAL_TABLET | Freq: Two times a day (BID) | ORAL | 0 refills | Status: DC

## 2018-02-28 MED ORDER — CYCLOBENZAPRINE HCL 5 MG PO TABS: ORAL_TABLET | ORAL | 0 refills | Status: DC

## 2018-02-28 NOTE — Progress Notes (Signed)
Inpatient Diabetes Program   Patient has signed and been given copy of consent for CGM/Freestyle [place] research study. MD also notified.  Education done regarding application and changing CGM sensor (alternate every 14 days on back of arms), 1 hour warm-up, use of glucometer/where to buy strips, how to scan CGM for glucose reading and information for PCP. Patient has been given [employer] reader and first sensor for use. Patient has also been given educational packet regarding use CGM sensor including the 1-800 toll free number for any questions, problems or needs related to the [facility] sensors or reader.  Patient to be given Rx. For sensors with prescriptions/discharge paper work.  Sensor applied by patient to (L) Arm at (1035am).  Explained that glucose readings will not be available until 1 hour after application. Patient understands that Diabetes coordinator will call them 2 times after discharge (between days 7-12 after d/c from hospital and between days 30-25 after d/c from hospital). Patient verbalizes understanding of use of Freestyle [place] CGM and was told that any issues with blood sugars/diabetes will need to be addressed by PCP.  Diabetes Quality of Life Survey administered to patient.   Thanks,  Tama Headings RN, MSN, BC-ADM, [facility] Inpatient Diabetes Coordinator Team Pager [phone] (8a-5p)

## 2018-02-28 NOTE — Progress Notes (Signed)
Discharge instructions read and given to pt. Pt voice understanding. Telemetry and IV removed. Belongings packed. Pt husband outside waiting to transport pt home. Pt transported out via Wheelchair.

## 2018-02-28 NOTE — Discharge Summary (Addendum)
Discharge Summary  [name] MRN:[id number] DOB: 03/31/1966  PCP: Weber, Sarah L, PA-C  Admit date: 02/21/2018 Discharge date: 02/28/2018  Time spent: <30mins  Recommendations for Outpatient Follow-up:  1. F/u with PMD within a week  for hospital discharge follow up, repeat cbc/bmp at follow up 2. F/u with pulmonology in one month  Discharge Diagnoses:  Active Hospital Problems   Diagnosis Date Noted  . Acute bronchitis 02/21/2018  . CAP (community acquired pneumonia) 02/21/2018    Resolved Hospital Problems  No resolved problems to display.    Discharge Condition: stable  Diet recommendation: heart healthy/carb modified  Filed Weights   02/26/18 0417 02/27/18 0500 02/28/18 0349  Weight: 114.7 kg (252 lb 13.9 oz) 114 kg (251 lb 5.2 oz) 111.9 kg (246 lb 11.1 oz)    History of present illness: (per admitting MD Dr Hall) PCP: Weber, Sarah L, PA-C  Outpatient Specialists: Cardiology Patient coming from: Home  Chief Complaint: Shortness of breath and cough  HPI: [name] is a 51 y.o. female with medical history significant for COPD, OSA compliant with CPAP, morbid obesity, type 2 diabetes, generalized anxiety, chronic diastolic CHF, who presented to [facility] with complaints of shortness of breath of 5 days duration.  Patient reports symptoms have been gradually worsening.  Associated with productive cough with yellowish sputum, intermittent moderate chest tightness, and dyspnea on exertion.  No sick contacts.  Current smoker 30+-year.  Admits to doing outside work and exposure to pollen.  Endorses night sweats and chills.  Reports compliance with medications and CPAP.  ED Course: On presentation hypoxic with O2 saturation in the 80s on room air, tachycardic and tachypneic.  CTA unremarkable for acute PE.  Chest x-ray and CT chest personally reviewed revealed bilateral pulmonary interstitial infiltrates.  Lab studies remarkable for lactic acidosis with a lactic acid of 7,  leukocytosis with WBC 14.  Hypokalemia.  EKG with sinus tach rate of 117 and ventricular bigeminy.  Patient admitted for sepsis secondary to CAP bacterial versus viral, present on admission.  Promptly started on IV antibiotics IV ceftriaxone and IV azithromycin.    Hospital Course:  Active Problems:   Acute bronchitis   CAP (community acquired pneumonia)   Sepsis with community acquired pneumonia, copd exacerbation, acute hypoxic respiratory failure -she presented with leukocytosis, lactic acidosis, sinus tachycardia, tachypnea, hypoxia  on admission -CTA no pe, does has " RIGHT tree-in-bud infiltrates and LEFT centrilobular ground-glass nodules may be infectious or inflammatory. No focal consolidation." -blood culture no growth -respiratory panel + mycoplasam pneumoniae -she is treated with zitrhmax, add doxycycline due to severity of symptom.  continue nebs, mucinex,  -wheezing and hypoxia has resolved at discharge, she is discharged on zithro/doxycycline/prednison taper  Chronic diastolic chf: -Last ef done in 2017,  -Currently appear euvolemic,  lasix held on presentation in the setting of sepsis -repeat echo with preserved lvef, no significant interval changes compare to that from 2017 -iv lasix on 5/30, 31, 6/1, 6/2 and 6/3, she reports at home take lasix everyother day.  -she is discharged on lasix 40mg mwf.    Insulin dependent DM2 -a1c 8.2 -blood glucose elevated, likely due to steroids,  -Taper steroids, continue to adjust insulin -Diabetes coordinator input appreciated, per diabetes coordinator patient likely will benefit from abbott libre trial , plan per diabetes coordinator (one device, two sensors) -continue home insulin regimen  HLD; continue home meds  Tobacco use disorder Tobacco cessation counseling at bedside> 3mins She reports she smokes 4 cigerette a day She   declined Nicotine patch   Generalized anxiety/chronic depression Stable, denies  HI/SI Continue home medications OnValiumtwicedaily as needed IV Ativan daily as needed  OSA/ Body mass index is 45.97 kg/m. meet morbid obesity criteria Continue cpap, encourage weight loss she is on lorcaserin and phentermine at home      Code Status: full  Family Communication: patient   Disposition Plan:  d/c home    Consultants:  Diabetes coordinator  Procedures:  none  Antibiotics:  As above    Discharge Exam: BP 133/73 (BP Location: Right Arm)   Pulse 76   Temp (!) 97.4 F (36.3 C) (Oral)   Resp 17   Ht 5' 2" (1.575 m)   Wt 111.9 kg (246 lb 11.1 oz)   LMP 02/09/2018   SpO2 98%   BMI 45.12 kg/m   General: NAD Cardiovascular: RRR Respiratory: CTABL  Discharge Instructions You were cared for by a hospitalist during your hospital stay. If you have any questions about your discharge medications or the care you received while you were in the hospital after you are discharged, you can call the unit and asked to speak with the hospitalist on call if the hospitalist that took care of you is not available. Once you are discharged, your primary care physician will handle any further medical issues. Please note that NO REFILLS for any discharge medications will be authorized once you are discharged, as it is imperative that you return to your primary care physician (or establish a relationship with a primary care physician if you do not have one) for your aftercare needs so that they can reassess your need for medications and monitor your lab values.  Discharge Instructions    Diet - low sodium heart healthy   Complete by:  As directed    Carb modified   Increase activity slowly   Complete by:  As directed      Allergies as of 02/28/2018      Reactions   Contrast Media [iodinated Diagnostic Agents] Hives   Darvocet [propoxyphene N-acetaminophen] Hives, Itching   Lisinopril Cough   Metformin And Related Other (See Comments)   Chest pain    Morphine And Related Nausea And Vomiting   Percocet [oxycodone-acetaminophen] Itching   And headache   Robaxin [methocarbamol] Other (See Comments)   Headache   Wellbutrin [bupropion Hcl] Other (See Comments)   Hears voices   Zofran [ondansetron Hcl] Itching      Medication List    STOP taking these medications   dicyclomine 20 MG tablet Commonly known as:  BENTYL     TAKE these medications   albuterol 108 (90 Base) MCG/ACT inhaler Commonly known as:  PROVENTIL HFA;VENTOLIN HFA Inhale 2 puffs into the lungs every 6 (six) hours as needed for wheezing or shortness of breath.   atorvastatin 40 MG tablet Commonly known as:  LIPITOR Take 1 tablet (40 mg total) by mouth daily.   azithromycin 250 MG tablet Commonly known as:  ZITHROMAX Take 1 tablet (250 mg total) by mouth daily for 3 days.   celecoxib 200 MG capsule Commonly known as:  CELEBREX TAKE 1 CAPSULE (200 MG TOTAL) BY MOUTH DAILY. OFFICE VISIT NEEDED   cyclobenzaprine 5 MG tablet Commonly known as:  FLEXERIL TAKE 1 TABLET (5 MG TOTAL) BY MOUTH 3 (THREE) TIMES DAILY AS NEEDED FOR MUSCLE SPASMS.   diazepam 2 MG tablet Commonly known as:  VALIUM Take 0.5-1 tablets (1-2 mg total) every 12 (twelve) hours as needed by mouth for anxiety  or muscle spasms.   doxycycline 100 MG tablet Commonly known as:  VIBRA-TABS Take 1 tablet (100 mg total) by mouth every 12 (twelve) hours for 3 days.   FLUoxetine 20 MG capsule Commonly known as:  PROZAC Take 1 capsule (20 mg total) by mouth 2 (two) times daily. What changed:  Another medication with the same name was removed. Continue taking this medication, and follow the directions you see here.   FREESTYLE LIBRE 14 DAY READER Devi 1 each by Does not apply route once for 1 dose.   FREESTYLE LIBRE 14 DAY SENSOR Misc 2 each by Does not apply route once for 1 dose.   furosemide 20 MG tablet Commonly known as:  LASIX Take 2 tablets (40 mg total) by mouth every Monday, Wednesday,  and Friday. Start taking on:  03/01/2018 What changed:  See the new instructions.   gemfibrozil 600 MG tablet Commonly known as:  LOPID TAKE 1 TABLET BY MOUTH TWICE A DAY BEFORE A MEAL What changed:  See the new instructions.   glucose blood test strip Use as instructed   guaiFENesin 600 MG 12 hr tablet Commonly known as:  MUCINEX Take 2 tablets (1,200 mg total) by mouth 2 (two) times daily.   HYDROcodone-acetaminophen 5-325 MG tablet Commonly known as:  NORCO/VICODIN Take 2 tablets by mouth every 4 (four) hours as needed.   Insulin Glargine 300 UNIT/ML Sopn Commonly known as:  TOUJEO SOLOSTAR Inject 80 Units daily into the skin.   liraglutide 18 MG/3ML Sopn Commonly known as:  VICTOZA INJECT 0.3MLS SUBCUTANEOUSLY DAILY   Lorcaserin HCl 10 MG Tabs Take 10 mg by mouth daily.   metoprolol tartrate 50 MG tablet Commonly known as:  LOPRESSOR Take 1 tablet (50 mg total) by mouth 2 (two) times daily. Ov needed   NOVOTWIST 32G X 5 MM Misc Generic drug:  Insulin Pen Needle USE 1 APPLICATION BY DOES NOT APPLY ROUTE DAILY.   phentermine 37.5 MG capsule Take 1 capsule (37.5 mg total) every morning by mouth.   potassium chloride SA 20 MEQ tablet Commonly known as:  K-DUR,KLOR-CON Take 1 tablet (20 mEq total) by mouth every Monday, Wednesday, and Friday. Start taking on:  03/01/2018   predniSONE 10 MG tablet Commonly known as:  DELTASONE Label  & dispense according to the schedule below. 5 Pills PO on day one then, 4 Pills PO on day two, 3Pills PO on day three, 2Pills PO on day four, 1 Pills PO on day five,  then STOP.  Total of 15tabs   promethazine 25 MG tablet Commonly known as:  PHENERGAN Take 1 tablet (25 mg total) by mouth every 8 (eight) hours as needed for nausea or vomiting.      Allergies  Allergen Reactions  . Contrast Media [Iodinated Diagnostic Agents] Hives  . Darvocet [Propoxyphene N-Acetaminophen] Hives and Itching  . Lisinopril Cough  . Metformin And  Related Other (See Comments)    Chest pain  . Morphine And Related Nausea And Vomiting  . Percocet [Oxycodone-Acetaminophen] Itching    And headache  . Robaxin [Methocarbamol] Other (See Comments)    Headache   . Wellbutrin [Bupropion Hcl] Other (See Comments)    Hears voices  . Zofran [Ondansetron Hcl] Itching   Follow-up Information    Mancel Bale, PA-C Follow up in 1 week(s).   Specialty:  Physician Assistant Why:  hospital discharge follow up Contact information: [place] [address] [phone]        [facility] Follow up in 1 month(s).   Specialty:  Pulmonology Why:  for copd/asthma managment Contact information: [place] [phone]           The results of significant diagnostics from this hospitalization (including imaging, microbiology, ancillary and laboratory) are listed below for reference.    Significant Diagnostic Studies: Ct Angio Chest Pe W/cm &/or Wo Cm  Result Date: 02/21/2018 CLINICAL DATA:  Shortness of breath on oxygen. Suspect pulmonary embolism. History of hypertension, cardiac catheterization. EXAM: CT ANGIOGRAPHY CHEST WITH CONTRAST TECHNIQUE: Multidetector CT imaging of the chest was performed using the standard protocol during bolus administration of intravenous contrast. Multiplanar CT image reconstructions and MIPs were obtained to evaluate the vascular anatomy. CONTRAST:  56m ISOVUE-370 IOPAMIDOL (ISOVUE-370) INJECTION 76% COMPARISON:  Chest radiograph Feb 21, 2018 FINDINGS: CARDIOVASCULAR: Suboptimal contrast opacification of the pulmonary artery's (150 Hounsfield units, target is 250 Hounsfield units). Main pulmonary artery is not enlarged. No pulmonary arterial filling defects to the level of the segmental branches. Heart size is normal, no right heart strain. No pericardial effusion. Thoracic aorta is normal course and caliber, mild calcific atherosclerosis.  MEDIASTINUM/NODES: Borderline mediastinal lymphadenopathy attributable to parenchymal process, no routine indicated follow-up (This recommendation follows ACR consensus guidelines: Managing Incidental Findings on Thoracic CT: Mediastinal and Cardiovascular Findings. A White Paper of the ACR Incidental Findings Committee. J Am Coll Radiol. 2018; 15:: 0102-7253. LUNGS/PLEURA: Tracheobronchial tree is patent, no pneumothorax. Mild bronchial wall thickening. Tree-in-bud infiltrates throughout all lobes of RIGHT lung. No focal consolidation. Faint ground-glass centrilobular nodules LEFT lung. No pleural effusion. UPPER ABDOMEN: Nonacute.  Status post cholecystectomy. MUSCULOSKELETAL: Nonacute. Mild degenerative change of the thoracic spine. Review of the MIP images confirms the above findings. IMPRESSION: 1. No acute pulmonary embolism. 2. RIGHT tree-in-bud infiltrates and LEFT centrilobular ground-glass nodules may be infectious or inflammatory. No focal consolidation. Aortic Atherosclerosis (ICD10-I70.0). Electronically Signed   By: CElon AlasM.D.   On: 02/21/2018 15:32   Dg Chest Port 1 View  Result Date: 02/21/2018 CLINICAL DATA:  Back pain and shortness of breath for the past 5 days; current smoker. EXAM: PORTABLE CHEST 1 VIEW COMPARISON:  PA and lateral chest x-ray of September 28, 2016 FINDINGS: The lungs are adequately inflated. The interstitial markings are mildly increased diffusely. The heart and pulmonary vascularity are normal. The mediastinum is normal in width. There is calcification in the wall of the aortic arch. There is no pleural effusion. IMPRESSION: Increased interstitial markings bilaterally may reflect acute on chronic bronchitic kit-smoking related changes. No alveolar pneumonia nor CHF. Thoracic aortic atherosclerosis. Electronically Signed   By: David  JMartiniqueM.D.   On: 02/21/2018 11:13    Microbiology: Recent Results (from the past 240 hour(s))  Blood Culture (routine x 2)      Status: None   Collection Time: 02/21/18 11:50 AM  Result Value Ref Range Status   Specimen Description   Final    RIGHT [facility]TECUBITAL [facility] [id number]   Special Requests   Final    BOTTLES DRAWN AEROBIC AND ANAEROBIC Blood Culture adequate volume [facility] [address] [id number]   Culture   Final    NO GROWTH 5 DAYS Performed at [facility] [address] [place] [address]   Report Status 02/26/2018 FINAL  Final  Blood Culture (routine x 2)  Status: None   Collection Time: 02/21/18 12:00 PM  Result Value Ref Range Status   Specimen Description   Final    BLOOD LEFT FOREARM Performed at [facility], [address] [place], [place] [address]    Special Requests   Final    BOTTLES DRAWN AEROBIC AND ANAEROBIC Blood Culture adequate volume Performed at [facility], [address] [place], [place] [address]    Culture   Final    NO GROWTH 5 DAYS Performed at [facility], [place] [address], [place], [place] [address]    Report Status 02/26/2018 FINAL  Final  Respiratory Panel by PCR     Status: Abnormal   Collection Time: 02/22/18  7:05 AM  Result Value Ref Range Status   Adenovirus NOT DETECTED NOT DETECTED Final   Coronavirus 229E NOT DETECTED NOT DETECTED Final   Coronavirus HKU1 NOT DETECTED NOT DETECTED Final   Coronavirus NL63 NOT DETECTED NOT DETECTED Final   Coronavirus OC43 NOT DETECTED NOT DETECTED Final   Metapneumovirus NOT DETECTED NOT DETECTED Final   Rhinovirus / Enterovirus NOT DETECTED NOT DETECTED Final   Influenza A NOT DETECTED NOT DETECTED Final   Influenza B NOT DETECTED NOT DETECTED Final   Parainfluenza Virus 1 NOT DETECTED NOT DETECTED Final   Parainfluenza Virus 2 NOT DETECTED NOT DETECTED Final   Parainfluenza Virus 3 NOT DETECTED NOT DETECTED Final   Parainfluenza Virus 4 NOT DETECTED  NOT DETECTED Final   Respiratory Syncytial Virus NOT DETECTED NOT DETECTED Final   Bordetella pertussis NOT DETECTED NOT DETECTED Final   Chlamydophila pneumoniae NOT DETECTED NOT DETECTED Final   Mycoplasma pneumoniae DETECTED (A) NOT DETECTED Final     Labs: Basic Metabolic Panel: Recent Labs  Lab 02/22/18 0329  02/23/18 0424 02/24/18 0336 02/25/18 0439 02/26/18 0356 02/27/18 0743 02/28/18 0311  NA 139   < > 137 136 137 137 140 138  K 3.8   < > 5.0 4.1 4.0 4.5 4.4 3.7  CL 106   < > 107 103 101 100* 99* 103  CO2 20*   < > 21* _0 GLUCOSE 341*   < > 338* 354* 195* 178* 171* 76  BUN 10   < > 17 24* 23* 27* 28* 31*  CREATININE 0.79   < > 0.76 0.90 0.80 0.92 0.98 0.84  CALCIUM 8.7*   < > 8.7* 8.7* 8.9 9.1 9.6 8.8*  MG 2.3  --  2.1  --   --  2.0 2.1  --    < > = values in this interval not displayed.   Liver Function Tests: No results for input(s): AST, ALT, ALKPHOS, BILITOT, PROT, ALBUMIN in the last 168 hours. No results for input(s): LIPASE, AMYLASE in the last 168 hours. No results for input(s): AMMONIA in the last 168 hours. CBC: Recent Labs  Lab 02/21/18 1111 02/22/18 0329 02/23/18 0424 02/27/18 0743 02/28/18 0311  WBC 14.0* 17.8* 24.6* 23.2* 24.6*  NEUTROABS 9.3*  --   --   --   --   HGB 13.4 11.9* 11.8* 16.0* 15.5*  HCT 37.6 36.0 34.5* 47.4* 45.0  MCV 88.7 91.8 94.0 88.4 86.0  PLT 257 287 292 398 385   Cardiac Enzymes: Recent Labs  Lab 02/21/18 1111  TROPONINI <0.03   BNP: BNP (last 3 results) No results for input(s): BNP in the last 8760 hours.  ProBNP (last 3 results) No results for input(s):  PROBNP in the last 8760 hours.  CBG: Recent Labs  Lab 02/27/18 1225 02/27/18 1635 02/27/18 1928 02/27/18 2116 02/28/18 0805  GLUCAP 187* 353* 366* 265* 71       Signed:    MD, PhD  [facility] 02/28/2018, 9:48 AM    

## 2018-03-02 ENCOUNTER — Institutional Professional Consult (permissible substitution): Payer: BLUE CROSS/BLUE SHIELD | Admitting: Pulmonary Disease

## 2018-03-02 NOTE — Progress Notes (Deleted)
Synopsis: Referred in June 2019 for COPD***  Subjective:   PATIENT ID: [name] GENDER: female DOB: November 02, 1965, MRN: [id number]   HPI  No chief complaint on file.   ***  Past Medical History:  Diagnosis Date  . Anxiety   . Arthritis   . Diabetes mellitus   . Endometriosis   . Hypersomnia, persistent    epworth 16   . Hypertension   . Lyme borreliosis   . Migraine   . Morbid obesity ([place])   . Obesity, Class III, BMI 40-49.9 (morbid obesity) ([place])   . Obstructive sleep apnea    CPAP NIGHTLY; sleep study 2007.  Marland Kitchen Renal disorder    kidney stone     Family History  Problem Relation Age of Onset  . Diabetes Mother   . Thyroid disease Mother   . Depression Mother        with anxiety  . Stroke Mother   . Diabetes Father   . Hypertension Father   . Diabetes Sister   . Hypertension Sister   . Hypertension Brother      Social History   Socioeconomic History  . Marital status: Married    Spouse name: Elta Guadeloupe  . Number of children: 1  . Years of education: 21  . Highest education level: Not on file  Occupational History  . 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 Financial resource strain: Not on file  . Food insecurity:    Worry: Not on file    Inability: Not on file  . Transportation needs:    Medical: Not on file    Non-medical: Not on file  Tobacco Use  . Smoking status: Current Every Day Smoker    Packs/day: 0.20    Years: 0.00    Pack years: 0.00    Types: Cigarettes  . Smokeless tobacco: Never Used  Substance and Sexual Activity  . Alcohol use: No  . Drug use: No  . Sexual activity: Not on file  Lifestyle  . Physical activity:    Days per week: Not on file    Minutes per session: Not on file  . Stress: Not on file  Relationships  . Social connections:    Talks on phone: Not on file    Gets together: Not on file    Attends religious service: Not on file    Active member of club or  organization: Not on file    Attends meetings of clubs or organizations: Not on file    Relationship status: Not on file  . Intimate partner violence:    Fear of current or ex partner: Not on file    Emotionally abused: Not on file    Physically abused: Not on file    Forced sexual activity: Not on file  Other Topics Concern  . Not on file  Social History Narrative   Marital status: married x 25 years.      Children:  One son (24), no grandchildren, 26 dogs      Lives: with husband, son.      Employment: Museum/gallery curator part       Tobacco: 1/2 ppd x 15 years.      Alcohol: socially; special ocassions.      Drugs: none      Exercise: none   Caffeine Use: 2 cups daily     Allergies  Allergen Reactions  . Contrast Media [Iodinated Diagnostic Agents]  Hives  . Darvocet [Propoxyphene N-Acetaminophen] Hives and Itching  . Lisinopril Cough  . Metformin And Related Other (See Comments)    Chest pain  . Morphine And Related Nausea And Vomiting  . Percocet [Oxycodone-Acetaminophen] Itching    And headache  . Robaxin [Methocarbamol] Other (See Comments)    Headache   . Wellbutrin [Bupropion Hcl] Other (See Comments)    Hears voices  . Zofran [Ondansetron Hcl] Itching     Outpatient Medications Prior to Visit  Medication Sig Dispense Refill  . albuterol (PROVENTIL HFA;VENTOLIN HFA) 108 (90 Base) MCG/ACT inhaler Inhale 2 puffs into the lungs every 6 (six) hours as needed for wheezing or shortness of breath. 1 Inhaler 2  . atorvastatin (LIPITOR) 40 MG tablet Take 1 tablet (40 mg total) by mouth daily. 90 tablet 3  . azithromycin (ZITHROMAX) 250 MG tablet Take 1 tablet (250 mg total) by mouth daily for 3 days. 3 each 0  . celecoxib (CELEBREX) 200 MG capsule TAKE 1 CAPSULE (200 MG TOTAL) BY MOUTH DAILY. OFFICE VISIT NEEDED 15 capsule 0  . cyclobenzaprine (FLEXERIL) 5 MG tablet TAKE 1 TABLET (5 MG TOTAL) BY MOUTH 3 (THREE) TIMES DAILY AS NEEDED FOR MUSCLE SPASMS. 30 tablet 0  . diazepam  (VALIUM) 2 MG tablet Take 0.5-1 tablets (1-2 mg total) every 12 (twelve) hours as needed by mouth for anxiety or muscle spasms. 40 tablet 0  . doxycycline (VIBRA-TABS) 100 MG tablet Take 1 tablet (100 mg total) by mouth every 12 (twelve) hours for 3 days. 6 tablet 0  . FLUoxetine (PROZAC) 20 MG capsule Take 1 capsule (20 mg total) by mouth 2 (two) times daily. 180 capsule 1  . furosemide (LASIX) 20 MG tablet Take 2 tablets (40 mg total) by mouth every Monday, Wednesday, and Friday. 30 tablet 0  . gemfibrozil (LOPID) 600 MG tablet TAKE 1 TABLET BY MOUTH TWICE A DAY BEFORE A MEAL 60 tablet 0  . glucose blood test strip Use as instructed 100 each 12  . guaiFENesin (MUCINEX) 600 MG 12 hr tablet Take 2 tablets (1,200 mg total) by mouth 2 (two) times daily. 60 tablet 0  . HYDROcodone-acetaminophen (NORCO/VICODIN) 5-325 MG tablet Take 2 tablets by mouth every 4 (four) hours as needed. 20 tablet 0  . Insulin Glargine (TOUJEO SOLOSTAR) 300 UNIT/ML SOPN Inject 80 Units daily into the skin. 18 pen 1  . liraglutide (VICTOZA) 18 MG/3ML SOPN INJECT 0.3MLS SUBCUTANEOUSLY DAILY 15 pen 1  . Lorcaserin HCl 10 MG TABS Take 10 mg by mouth daily. 30 tablet 2  . metoprolol tartrate (LOPRESSOR) 50 MG tablet Take 1 tablet (50 mg total) by mouth 2 (two) times daily. Ov needed 30 tablet 0  . NOVOTWIST 32G X 5 MM MISC USE 1 APPLICATION BY DOES NOT APPLY ROUTE DAILY. 200 each 3  . phentermine 37.5 MG capsule Take 1 capsule (37.5 mg total) every morning by mouth. 30 capsule 1  . potassium chloride SA (K-DUR,KLOR-CON) 20 MEQ tablet Take 1 tablet (20 mEq total) by mouth every Monday, Wednesday, and Friday. 30 tablet 0  . predniSONE (DELTASONE) 10 MG tablet Label  & dispense according to the schedule below. 5 Pills PO on day one then, 4 Pills PO on day two, 3Pills PO on day three, 2Pills PO on day four, 1 Pills PO on day five,  then STOP.  Total of 15tabs 15 tablet 0  . promethazine (PHENERGAN) 25 MG tablet Take 1 tablet (25 mg  total) by mouth every 8 (  eight) hours as needed for nausea or vomiting. 10 tablet 0   No facility-administered medications prior to visit.     ROS    Objective:  Physical Exam   There were no vitals filed for this visit.  ***  CBC    Component Value Date/Time   WBC 24.6 (H) 02/28/2018 0311   RBC 5.23 (H) 02/28/2018 0311   HGB 15.5 (H) 02/28/2018 0311   HCT 45.0 02/28/2018 0311   PLT 385 02/28/2018 0311   MCV 86.0 02/28/2018 0311   MCV 90.6 12/08/2014 1305   MCH 29.6 02/28/2018 0311   MCHC 34.4 02/28/2018 0311   RDW 13.8 02/28/2018 0311   LYMPHSABS 3.6 02/21/2018 1111   MONOABS 0.8 02/21/2018 1111   EOSABS 0.2 02/21/2018 1111   BASOSABS 0.1 02/21/2018 1111     Chest imaging: 01/2017 CT angiogram chest images reviewed: obese, centrilobular nodular infiltrate and tree-in-bud abnormalities noted upper lobes bilaterally, no PE  PFT:  Labs:  Path:  Echo:  Heart Catheterization:  June 2019 hospital discharge records reviewed where the patient had a COPD exacerbation treated with steroids and bronchodialtors.     Assessment & Plan:   No diagnosis found.  Discussion: ***    Current Outpatient Medications:  .  albuterol (PROVENTIL HFA;VENTOLIN HFA) 108 (90 Base) MCG/ACT inhaler, Inhale 2 puffs into the lungs every 6 (six) hours as needed for wheezing or shortness of breath., Disp: 1 Inhaler, Rfl: 2 .  atorvastatin (LIPITOR) 40 MG tablet, Take 1 tablet (40 mg total) by mouth daily., Disp: 90 tablet, Rfl: 3 .  azithromycin (ZITHROMAX) 250 MG tablet, Take 1 tablet (250 mg total) by mouth daily for 3 days., Disp: 3 each, Rfl: 0 .  celecoxib (CELEBREX) 200 MG capsule, TAKE 1 CAPSULE (200 MG TOTAL) BY MOUTH DAILY. OFFICE VISIT NEEDED, Disp: 15 capsule, Rfl: 0 .  cyclobenzaprine (FLEXERIL) 5 MG tablet, TAKE 1 TABLET (5 MG TOTAL) BY MOUTH 3 (THREE) TIMES DAILY AS NEEDED FOR MUSCLE SPASMS., Disp: 30 tablet, Rfl: 0 .  diazepam (VALIUM) 2 MG tablet, Take 0.5-1 tablets  (1-2 mg total) every 12 (twelve) hours as needed by mouth for anxiety or muscle spasms., Disp: 40 tablet, Rfl: 0 .  doxycycline (VIBRA-TABS) 100 MG tablet, Take 1 tablet (100 mg total) by mouth every 12 (twelve) hours for 3 days., Disp: 6 tablet, Rfl: 0 .  FLUoxetine (PROZAC) 20 MG capsule, Take 1 capsule (20 mg total) by mouth 2 (two) times daily., Disp: 180 capsule, Rfl: 1 .  furosemide (LASIX) 20 MG tablet, Take 2 tablets (40 mg total) by mouth every Monday, Wednesday, and Friday., Disp: 30 tablet, Rfl: 0 .  gemfibrozil (LOPID) 600 MG tablet, TAKE 1 TABLET BY MOUTH TWICE A DAY BEFORE A MEAL, Disp: 60 tablet, Rfl: 0 .  glucose blood test strip, Use as instructed, Disp: 100 each, Rfl: 12 .  guaiFENesin (MUCINEX) 600 MG 12 hr tablet, Take 2 tablets (1,200 mg total) by mouth 2 (two) times daily., Disp: 60 tablet, Rfl: 0 .  HYDROcodone-acetaminophen (NORCO/VICODIN) 5-325 MG tablet, Take 2 tablets by mouth every 4 (four) hours as needed., Disp: 20 tablet, Rfl: 0 .  Insulin Glargine (TOUJEO SOLOSTAR) 300 UNIT/ML SOPN, Inject 80 Units daily into the skin., Disp: 18 pen, Rfl: 1 .  liraglutide (VICTOZA) 18 MG/3ML SOPN, INJECT 0.3MLS SUBCUTANEOUSLY DAILY, Disp: 15 pen, Rfl: 1 .  Lorcaserin HCl 10 MG TABS, Take 10 mg by mouth daily., Disp: 30 tablet, Rfl: 2 .  metoprolol tartrate (  LOPRESSOR) 50 MG tablet, Take 1 tablet (50 mg total) by mouth 2 (two) times daily. Ov needed, Disp: 30 tablet, Rfl: 0 .  NOVOTWIST 32G X 5 MM MISC, USE 1 APPLICATION BY DOES NOT APPLY ROUTE DAILY., Disp: 200 each, Rfl: 3 .  phentermine 37.5 MG capsule, Take 1 capsule (37.5 mg total) every morning by mouth., Disp: 30 capsule, Rfl: 1 .  potassium chloride SA (K-DUR,KLOR-CON) 20 MEQ tablet, Take 1 tablet (20 mEq total) by mouth every Monday, Wednesday, and Friday., Disp: 30 tablet, Rfl: 0 .  predniSONE (DELTASONE) 10 MG tablet, Label  & dispense according to the schedule below. 5 Pills PO on day one then, 4 Pills PO on day two, 3Pills  PO on day three, 2Pills PO on day four, 1 Pills PO on day five,  then STOP.  Total of 15tabs, Disp: 15 tablet, Rfl: 0 .  promethazine (PHENERGAN) 25 MG tablet, Take 1 tablet (25 mg total) by mouth every 8 (eight) hours as needed for nausea or vomiting., Disp: 10 tablet, Rfl: 0

## 2018-03-06 DIAGNOSIS — M9901 Segmental and somatic dysfunction of cervical region: Secondary | ICD-10-CM | POA: Diagnosis not present

## 2018-03-06 DIAGNOSIS — M5431 Sciatica, right side: Secondary | ICD-10-CM | POA: Diagnosis not present

## 2018-03-07 ENCOUNTER — Other Ambulatory Visit: Payer: Self-pay

## 2018-03-07 ENCOUNTER — Encounter: Payer: Self-pay | Admitting: Physician Assistant

## 2018-03-07 ENCOUNTER — Ambulatory Visit: Payer: BLUE CROSS/BLUE SHIELD | Admitting: Physician Assistant

## 2018-03-07 VITALS — BP 112/82 | HR 87 | Temp 99.0°F | Resp 18 | Ht 62.0 in | Wt 240.0 lb

## 2018-03-07 DIAGNOSIS — J181 Lobar pneumonia, unspecified organism: Secondary | ICD-10-CM

## 2018-03-07 DIAGNOSIS — Z09 Encounter for follow-up examination after completed treatment for conditions other than malignant neoplasm: Secondary | ICD-10-CM

## 2018-03-07 DIAGNOSIS — G4730 Sleep apnea, unspecified: Secondary | ICD-10-CM

## 2018-03-07 DIAGNOSIS — E119 Type 2 diabetes mellitus without complications: Secondary | ICD-10-CM

## 2018-03-07 DIAGNOSIS — Z23 Encounter for immunization: Secondary | ICD-10-CM

## 2018-03-07 DIAGNOSIS — Z794 Long term (current) use of insulin: Secondary | ICD-10-CM

## 2018-03-07 DIAGNOSIS — G4733 Obstructive sleep apnea (adult) (pediatric): Secondary | ICD-10-CM

## 2018-03-07 DIAGNOSIS — E1165 Type 2 diabetes mellitus with hyperglycemia: Secondary | ICD-10-CM

## 2018-03-07 DIAGNOSIS — I10 Essential (primary) hypertension: Secondary | ICD-10-CM

## 2018-03-07 DIAGNOSIS — N809 Endometriosis, unspecified: Secondary | ICD-10-CM | POA: Diagnosis not present

## 2018-03-07 DIAGNOSIS — J189 Pneumonia, unspecified organism: Secondary | ICD-10-CM

## 2018-03-07 MED ORDER — INSULIN GLARGINE 300 UNIT/ML ~~LOC~~ SOPN: 80.0000 [IU] | PEN_INJECTOR | Freq: Every day | SUBCUTANEOUS | 2 refills | Status: DC

## 2018-03-07 MED ORDER — INSULIN ASPART 100 UNIT/ML FLEXPEN: 4.0000 [IU] | PEN_INJECTOR | Freq: Three times a day (TID) | SUBCUTANEOUS | 0 refills | Status: DC

## 2018-03-07 MED ORDER — HYDROCODONE-ACETAMINOPHEN 5-325 MG PO TABS: 2.0000 | ORAL_TABLET | ORAL | 0 refills | Status: DC | PRN

## 2018-03-07 MED ORDER — FREESTYLE LIBRE 14 DAY SENSOR MISC: 1.0000 "application " | 4 refills | Status: DC

## 2018-03-07 MED ORDER — LIRAGLUTIDE 18 MG/3ML ~~LOC~~ SOPN: PEN_INJECTOR | SUBCUTANEOUS | 1 refills | Status: DC

## 2018-03-07 MED ORDER — NOVOTWIST 32G X 5 MM MISC: 3 refills | Status: DC

## 2018-03-07 NOTE — Progress Notes (Signed)
[name]  MRN: [id number] DOB: 02-Jan-1966  PCP: Mancel Bale, PA-C  Chief Complaint  Patient presents with  . Hospitalization Follow-up  . Medication Refill    all meds     Subjective:  Pt presents to clinic for hospital follow-up.  She was admitted 5/28 and discharged 6/4. She was admitted with acute bronchitis and CAP.  She has h/o COPD, OSA with CPAP, Type 2 DM, chronic diastolic CHF with a 20+ pack year history. She was admitted with an O2 saturation of 80% on RA.    Imaging: CTA showed no acute PE. Chest x-ray and CT showed bilateral pulmonary infiltrates.  Labs: Lactic acidosis upon admittance resolved through stay Leukocytosis improved during stay  Medications: Patient is treated with IV ceftriaxone and azithromycin -and was released home on azithromycin and prednisone which she has finished   Since returning home patient has continued to feel better.  She still has lower energy.  She has finished her antibiotics he continues her daily medications.  During her hospital stay she realized that she really needed to change some of her lifestyle habits.  She has stopped smoking. She is really changed her eating habits as she realizes that her diabetes is completely out of control due to her eating.  She has cut out breads and eating better since getting the freestyle libre - most of her afternoon glucose readings are high - most of the time it is in 200s.  She is eating based on what her sugars are before she eats.  She is eating almost no sugars.  She was on prednisone in the hospital but she is now off of it.  Her sugars have not changed much since getting off of the prednisone.  A1C in the hospital was 8.2.  Current Tuojeo 80U at night. Victozia 1.8 in the am. In the hospital she was on rapid insulin with sliding scale.  She has decrease her valium since her hospitalization.  No sleeping at night due to cough.  Still out of work - she is not back to her pre  hospital energy level.  Quit smoking totally  History is obtained by patient.  Review of Systems  Constitutional: Negative for chills and fever.  Respiratory: Positive for cough (Almost gone). Negative for shortness of breath and wheezing.   Cardiovascular: Negative for chest pain, palpitations and leg swelling.  Skin: Negative for wound.  Neurological: Negative for numbness.    Patient Active Problem List   Diagnosis Date Noted  . Acute bronchitis 02/21/2018  . CAP (community acquired pneumonia) 02/21/2018  . Colon, diverticulosis 07/01/2017  . Anxiety 03/08/2016  . Chest pain 02/20/2016  . Vitamin D insufficiency 09/25/2015  . Endometriosis 01/17/2014  . Sleep apnea with use of continuous positive airway pressure (CPAP) 06/27/2013  . Nocturia 06/27/2013  . Obesity, Class III, BMI 40-49.9 (morbid obesity) ([place])   . Obstructive sleep apnea 02/15/2013  . Hypersomnia, persistent   . HTN (hypertension) 02/03/2013  . DM (diabetes mellitus), type 2 ([place]) 02/03/2013    Current Outpatient Medications on File Prior to Visit  Medication Sig Dispense Refill  . albuterol (PROVENTIL HFA;VENTOLIN HFA) 108 (90 Base) MCG/ACT inhaler Inhale 2 puffs into the lungs every 6 (six) hours as needed for wheezing or shortness of breath. 1 Inhaler 2  . atorvastatin (LIPITOR) 40 MG tablet Take 1 tablet (40 mg total) by mouth daily. 90 tablet 3  . celecoxib (CELEBREX) 200 MG capsule TAKE 1 CAPSULE (200 MG TOTAL) BY  MOUTH DAILY. OFFICE VISIT NEEDED 15 capsule 0  . cyclobenzaprine (FLEXERIL) 5 MG tablet TAKE 1 TABLET (5 MG TOTAL) BY MOUTH 3 (THREE) TIMES DAILY AS NEEDED FOR MUSCLE SPASMS. 30 tablet 0  . diazepam (VALIUM) 2 MG tablet Take 0.5-1 tablets (1-2 mg total) every 12 (twelve) hours as needed by mouth for anxiety or muscle spasms. 40 tablet 0  . FLUoxetine (PROZAC) 20 MG capsule Take 1 capsule (20 mg total) by mouth 2 (two) times daily. 180 capsule 1  . furosemide (LASIX) 20 MG tablet Take 2  tablets (40 mg total) by mouth every Monday, Wednesday, and Friday. 30 tablet 0  . gemfibrozil (LOPID) 600 MG tablet TAKE 1 TABLET BY MOUTH TWICE A DAY BEFORE A MEAL 60 tablet 0  . glucose blood test strip Use as instructed 100 each 12  . guaiFENesin (MUCINEX) 600 MG 12 hr tablet Take 2 tablets (1,200 mg total) by mouth 2 (two) times daily. 60 tablet 0  . Lorcaserin HCl 10 MG TABS Take 10 mg by mouth daily. 30 tablet 2  . metoprolol tartrate (LOPRESSOR) 50 MG tablet Take 1 tablet (50 mg total) by mouth 2 (two) times daily. Ov needed 30 tablet 0  . phentermine 37.5 MG capsule Take 1 capsule (37.5 mg total) every morning by mouth. 30 capsule 1  . potassium chloride SA (K-DUR,KLOR-CON) 20 MEQ tablet Take 1 tablet (20 mEq total) by mouth every Monday, Wednesday, and Friday. 30 tablet 0  . promethazine (PHENERGAN) 25 MG tablet Take 1 tablet (25 mg total) by mouth every 8 (eight) hours as needed for nausea or vomiting. 10 tablet 0  . Continuous Blood Gluc Sensor (FREESTYLE LIBRE 14 DAY SENSOR) MISC   0  . [DISCONTINUED] Ranitidine HCl (ZANTAC PO) Take 1 tablet by mouth 2 (two) times daily. Patient uses this medication for heartburn.     No current facility-administered medications on file prior to visit.     Allergies  Allergen Reactions  . Contrast Media [Iodinated Diagnostic Agents] Hives  . Darvocet [Propoxyphene N-Acetaminophen] Hives and Itching  . Lisinopril Cough  . Metformin And Related Other (See Comments)    Chest pain  . Morphine And Related Nausea And Vomiting  . Percocet [Oxycodone-Acetaminophen] Itching    And headache  . Robaxin [Methocarbamol] Other (See Comments)    Headache   . Wellbutrin [Bupropion Hcl] Other (See Comments)    Hears voices  . Zofran [Ondansetron Hcl] Itching    Past Medical History:  Diagnosis Date  . Anxiety   . Arthritis   . Diabetes mellitus   . Endometriosis   . Hypersomnia, persistent    epworth 16   . Hypertension   . Lyme borreliosis   .  Migraine   . Morbid obesity ([place])   . Obesity, Class III, BMI 40-49.9 (morbid obesity) ([place])   . Obstructive sleep apnea    CPAP NIGHTLY; sleep study 2007.  Marland Kitchen Renal disorder    kidney stone   Social History   Social History Narrative   Marital status: married x 25 years.      Children:  One son (65), no grandchildren, 62 dogs      Lives: with husband, son.      Employment: Museum/gallery curator part       Tobacco: 1/2 ppd x 15 years.      Alcohol: socially; special ocassions.      Drugs: none      Exercise: none   Caffeine Use: 2 cups daily  Social History   Tobacco Use  . Smoking status: Former Smoker    Packs/day: 0.20    Years: 0.00    Pack years: 0.00    Types: Cigarettes  . Smokeless tobacco: Never Used  Substance Use Topics  . Alcohol use: No  . Drug use: No   family history includes Depression in her mother; Diabetes in her father, mother, and sister; Hypertension in her brother, father, and sister; Stroke in her mother; Thyroid disease in her mother.     Objective:  BP 112/82   Pulse 87   Temp 99 F (37.2 C) (Oral)   Resp 18   Ht '5\' 2"'$  (1.575 m)   Wt 240 lb (108.9 kg)   LMP 02/09/2018   SpO2 97%   BMI 43.90 kg/m  Body mass index is 43.9 kg/m.  Wt Readings from Last 3 Encounters:  03/07/18 240 lb (108.9 kg)  02/28/18 246 lb 11.1 oz (111.9 kg)  11/11/17 245 lb (111.1 kg)    Physical Exam  Constitutional: She is oriented to person, place, and time. She appears well-developed and well-nourished.  HENT:  Head: Normocephalic and atraumatic.  Right Ear: External ear normal.  Left Ear: External ear normal.  Eyes: Conjunctivae are normal.  Neck: Normal range of motion.  Cardiovascular: Normal rate, regular rhythm and normal heart sounds.  No murmur heard. Pulmonary/Chest: Effort normal and breath sounds normal.  Neurological: She is alert and oriented to person, place, and time.  Skin: Skin is warm and dry.  Psychiatric: She has a normal mood and affect.  Her behavior is normal. Judgment and thought content normal.  Vitals reviewed.   Assessment and Plan :  Community acquired pneumonia of left lower lobe of lung ([place]) - Plan: CBC with Differential/Platelet, Pneumococcal polysaccharide vaccine 23-valent greater than or equal to 2yo subcutaneous/IM -patient was discharged on June 4 and is continuing to feel better.  Still needs to make a follow-up appointment with pulmonology for likely COPD and any further treatment that is needed for that.  Endometriosis - Plan: HYDROcodone-acetaminophen (NORCO/VICODIN) 5-325 MG tablet she needs refill of this medicine which she -also at this point can use for cough to prevent liquid cough medications with elevated sugar content  Type 2 diabetes mellitus without complication, without long-term current use of insulin ([facility]) - Plan: NOVOTWIST 32G X 5 MM MISC -patient is uncontrolled diabetic at the hospital she was put on the [facility] freestyle which she really likes patient does not have to check her sugar manually with her fingers multiple times a day she has been using this to help guide her eating.  Sleep apnea with use of continuous positive airway pressure (CPAP) -continues to use CPAP  Essential hypertension -well-controlled  Obstructive sleep apnea -  Type 2 diabetes mellitus with hyperglycemia, with long-term current use of insulin ([facility]) - Plan: CMP14+EGFR, Continuous Blood Gluc Sensor (FREESTYLE LIBRE 14 DAY SENSOR) MISC, liraglutide (VICTOZA) 18 MG/3ML SOPN, Insulin Glargine (TOUJEO SOLOSTAR) 300 UNIT/ML SOPN, Pneumococcal polysaccharide vaccine 23-valent greater than or equal to 2yo subcutaneous/IM, insulin aspart (NOVOLOG FLEXPEN) 100 UNIT/ML FlexPen -patient will continue current medications but we will also start short acting insulin which patient will use based on glucose readings prior to eating and her carb counting that she will do during meals we will start with 4 units she will increase as necessary  she will be in contact with me through my chart within 1 to 2 weeks.  Hospital f/u  Windell Hummingbird PA-C  [facility] [place] 03/11/2018 7:28 AM

## 2018-03-07 NOTE — Patient Instructions (Addendum)
     IF you received an x-ray today, you will receive an invoice from [facility]. Please contact [facility] at [phone] with questions or concerns regarding your invoice.   IF you received labwork today, you will receive an invoice from [place]. Please contact [facility] at [phone] with questions or concerns regarding your invoice.   Our billing staff will not be able to assist you with questions regarding bills from these companies.  You will be contacted with the lab results as soon as they are available. The fastest way to get your results is to activate your My Chart account. Instructions are located on the last page of this paperwork. If you have not heard from [place] regarding the results in 2 weeks, please contact this office.    Continue Victozia at current dose.  Increase Tuojeo by 2 U every 3 days until am fasting glucose is closer to 120.   Start Novolog 4 U with meals we will increase as needed over the next weeks until your appt with me in 1 month

## 2018-03-08 LAB — CMP14+EGFR
ALBUMIN: 4.1 g/dL (ref 3.5–5.5)
ALK PHOS: 100 IU/L (ref 39–117)
ALT: 39 IU/L — ABNORMAL HIGH (ref 0–32)
AST: 19 IU/L (ref 0–40)
Albumin/Globulin Ratio: 1.8 (ref 1.2–2.2)
BUN/Creatinine Ratio: 18 (ref 9–23)
BUN: 14 mg/dL (ref 6–24)
Bilirubin Total: 0.3 mg/dL (ref 0.0–1.2)
CO2: 21 mmol/L (ref 20–29)
CREATININE: 0.79 mg/dL (ref 0.57–1.00)
Calcium: 9.5 mg/dL (ref 8.7–10.2)
Chloride: 102 mmol/L (ref 96–106)
GFR calc Af Amer: 100 mL/min/{1.73_m2} (ref >59)
GFR calc non Af Amer: 87 mL/min/{1.73_m2} (ref >59)
Globulin, Total: 2.3 g/dL (ref 1.5–4.5)
Glucose: 228 mg/dL — ABNORMAL HIGH (ref 65–99)
Potassium: 4.8 mmol/L (ref 3.5–5.2)
Sodium: 140 mmol/L (ref 134–144)
Total Protein: 6.4 g/dL (ref 6.0–8.5)

## 2018-03-08 LAB — CBC WITH DIFFERENTIAL/PLATELET
BASOS ABS: 0 10*3/uL (ref 0.0–0.2)
Basos: 0 %
EOS (ABSOLUTE): 0.2 10*3/uL (ref 0.0–0.4)
Eos: 2 %
HEMOGLOBIN: 14 g/dL (ref 11.1–15.9)
Hematocrit: 42.4 % (ref 34.0–46.6)
Immature Grans (Abs): 0 10*3/uL (ref 0.0–0.1)
Immature Granulocytes: 0 %
LYMPHS ABS: 3.2 10*3/uL — AB (ref 0.7–3.1)
Lymphs: 26 %
MCH: 29.9 pg (ref 26.6–33.0)
MCHC: 33 g/dL (ref 31.5–35.7)
MCV: 90 fL (ref 79–97)
MONOCYTES: 7 %
Monocytes Absolute: 0.9 10*3/uL (ref 0.1–0.9)
NEUTROS PCT: 65 %
Neutrophils Absolute: 8 10*3/uL — ABNORMAL HIGH (ref 1.4–7.0)
Platelets: 359 10*3/uL (ref 150–450)
RBC: 4.69 x10E6/uL (ref 3.77–5.28)
RDW: 15 % (ref 12.3–15.4)
WBC: 12.4 10*3/uL — ABNORMAL HIGH (ref 3.4–10.8)

## 2018-03-11 ENCOUNTER — Encounter: Payer: Self-pay | Admitting: Physician Assistant

## 2018-03-16 ENCOUNTER — Ambulatory Visit: Payer: Self-pay

## 2018-03-16 NOTE — Telephone Encounter (Signed)
Pt. called to report worsening swelling in lower legs, from feet to just about 4 " below the knees, bilaterally.  Denied redness or warmth.  Stated she also has swelling in her hands and abdomen. Reported an 8-9 # weight gain, since starting Novalog Insulin, on 03/08/18.  Denied shortness of breath.  Reported "a little cough" over past 2 days.  Stated she has been taking Furosemide 40 mg. daily, and doesn't feel it is making any difference in the swelling.  Stated she takes the Furosemide in the evening, and still isn't having to get up and urinate, much, at night.  Pt. took last dose of Novalog on Tuesday night.  Stated she feels the swelling is related Advised she will need to be evaluated due to the amt. of swelling and weight gain.  Pt. Declined an appt. Tomorrow, 6/21.  Prefers to have an appt. on Saturday, only.  Appt. Scheduled on 6/22 @ 10:00 AM.  Will send message to Windell Hummingbird to make aware of pt's. symptoms.  Care advice given per protocol.  Verb. Understanding.        Reason for Disposition . [1] MODERATE leg swelling (e.g., swelling extends up to knees) AND [2] new onset or worsening  Answer Assessment - Initial Assessment Questions 1. ONSET: "When did the swelling start?" (e.g., minutes, hours, days)     Ongoing swelling but dramatically increased since the 13th-14th 2. LOCATION: "What part of the leg is swollen?"  "Are both legs swollen or just one leg?"     Bilateral feet, ankles, and up to lower leg, about 4" below the knee  3. SEVERITY: "How bad is the swelling?" (e.g., localized; mild, moderate, severe)  - Localized - small area of swelling localized to one leg  - MILD pedal edema - swelling limited to foot and ankle, pitting edema < 1/4 inch (6 mm) deep, rest and elevation eliminate most or all swelling  - MODERATE edema - swelling of lower leg to knee, pitting edema > 1/4 inch (6 mm) deep, rest and elevation only partially reduce swelling  - SEVERE edema - swelling extends above  knee, facial or hand swelling present      Moderate 4. REDNESS: "Does the swelling look red or infected?"    Denied redness, warmth, tenderness  5. PAIN: "Is the swelling painful to touch?" If so, ask: "How painful is it?"   (Scale 1-10; mild, moderate or severe)    6-7/10 6. FEVER: "Do you have a fever?" If so, ask: "What is it, how was it measured, and when did it start?"      *No Answer* 7. CAUSE: "What do you think is causing the leg swelling?"     Novalog  8. MEDICAL HISTORY: "Do you have a history of heart failure, kidney disease, liver failure, or cancer?"     *No Answer* 9. RECURRENT SYMPTOM: "Have you had leg swelling before?" If so, ask: "When was the last time?" "What happened that time?"     This has been ongoing, but not to this extent.  10. OTHER SYMPTOMS: "Do you have any other symptoms?" (e.g., chest pain, difficulty breathing)       "a little cough" past 2 days. ; some swelling in the hands and some in stomach area  11. PREGNANCY: "Is there any chance you are pregnant?" "When was your last menstrual period?"      No; LMP ; husband has stage 3 CA  Protocols used: LEG SWELLING AND EDEMA-A-AH

## 2018-03-16 NOTE — Telephone Encounter (Signed)
Message to [facility] priority- wt gain - BLE swelling

## 2018-03-17 ENCOUNTER — Ambulatory Visit: Payer: Self-pay | Admitting: Physician Assistant

## 2018-03-17 NOTE — Telephone Encounter (Signed)
Pt has an appt tomorrow for evaluation - this is the best thing so someone can look at her legs.  I am not sure why insulin would be causing her increased swelling

## 2018-03-18 ENCOUNTER — Ambulatory Visit: Payer: Self-pay | Admitting: Physician Assistant

## 2018-03-24 ENCOUNTER — Telehealth: Payer: Self-pay | Admitting: *Deleted

## 2018-03-26 ENCOUNTER — Other Ambulatory Visit: Payer: Self-pay | Admitting: Physician Assistant

## 2018-03-26 DIAGNOSIS — E119 Type 2 diabetes mellitus without complications: Secondary | ICD-10-CM

## 2018-03-27 ENCOUNTER — Other Ambulatory Visit: Payer: Self-pay | Admitting: Physician Assistant

## 2018-03-27 DIAGNOSIS — E7849 Other hyperlipidemia: Secondary | ICD-10-CM

## 2018-03-28 DIAGNOSIS — M9901 Segmental and somatic dysfunction of cervical region: Secondary | ICD-10-CM | POA: Diagnosis not present

## 2018-03-28 DIAGNOSIS — M5431 Sciatica, right side: Secondary | ICD-10-CM | POA: Diagnosis not present

## 2018-04-10 DIAGNOSIS — M9901 Segmental and somatic dysfunction of cervical region: Secondary | ICD-10-CM | POA: Diagnosis not present

## 2018-04-10 DIAGNOSIS — M5431 Sciatica, right side: Secondary | ICD-10-CM | POA: Diagnosis not present

## 2018-04-18 ENCOUNTER — Encounter: Payer: Self-pay | Admitting: Physician Assistant

## 2018-04-18 ENCOUNTER — Other Ambulatory Visit: Payer: Self-pay

## 2018-04-18 ENCOUNTER — Ambulatory Visit: Payer: BLUE CROSS/BLUE SHIELD | Admitting: Physician Assistant

## 2018-04-18 ENCOUNTER — Other Ambulatory Visit: Payer: Self-pay | Admitting: Physician Assistant

## 2018-04-18 VITALS — BP 134/78 | HR 85 | Temp 98.8°F | Resp 18 | Ht 62.0 in | Wt 250.4 lb

## 2018-04-18 DIAGNOSIS — B379 Candidiasis, unspecified: Secondary | ICD-10-CM

## 2018-04-18 DIAGNOSIS — Z794 Long term (current) use of insulin: Secondary | ICD-10-CM | POA: Diagnosis not present

## 2018-04-18 DIAGNOSIS — E7849 Other hyperlipidemia: Secondary | ICD-10-CM | POA: Diagnosis not present

## 2018-04-18 DIAGNOSIS — N809 Endometriosis, unspecified: Secondary | ICD-10-CM

## 2018-04-18 DIAGNOSIS — I10 Essential (primary) hypertension: Secondary | ICD-10-CM | POA: Diagnosis not present

## 2018-04-18 DIAGNOSIS — W57XXXA Bitten or stung by nonvenomous insect and other nonvenomous arthropods, initial encounter: Secondary | ICD-10-CM

## 2018-04-18 DIAGNOSIS — S39012A Strain of muscle, fascia and tendon of lower back, initial encounter: Secondary | ICD-10-CM

## 2018-04-18 DIAGNOSIS — F430 Acute stress reaction: Secondary | ICD-10-CM

## 2018-04-18 DIAGNOSIS — R600 Localized edema: Secondary | ICD-10-CM

## 2018-04-18 DIAGNOSIS — F321 Major depressive disorder, single episode, moderate: Secondary | ICD-10-CM

## 2018-04-18 DIAGNOSIS — E1165 Type 2 diabetes mellitus with hyperglycemia: Secondary | ICD-10-CM

## 2018-04-18 DIAGNOSIS — E119 Type 2 diabetes mellitus without complications: Secondary | ICD-10-CM

## 2018-04-18 LAB — CMP14+EGFR
ALT: 22 IU/L (ref 0–32)
AST: 14 IU/L (ref 0–40)
Albumin/Globulin Ratio: 1.8 (ref 1.2–2.2)
Albumin: 4.4 g/dL (ref 3.5–5.5)
Alkaline Phosphatase: 109 IU/L (ref 39–117)
BILIRUBIN TOTAL: 0.2 mg/dL (ref 0.0–1.2)
BUN/Creatinine Ratio: 16 (ref 9–23)
BUN: 13 mg/dL (ref 6–24)
CALCIUM: 9.5 mg/dL (ref 8.7–10.2)
CHLORIDE: 100 mmol/L (ref 96–106)
CO2: 22 mmol/L (ref 20–29)
Creatinine, Ser: 0.83 mg/dL (ref 0.57–1.00)
GFR calc non Af Amer: 82 mL/min/{1.73_m2} (ref >59)
GFR, EST AFRICAN AMERICAN: 94 mL/min/{1.73_m2} (ref >59)
GLUCOSE: 185 mg/dL — AB (ref 65–99)
Globulin, Total: 2.5 g/dL (ref 1.5–4.5)
Potassium: 4.3 mmol/L (ref 3.5–5.2)
Sodium: 140 mmol/L (ref 134–144)
TOTAL PROTEIN: 6.9 g/dL (ref 6.0–8.5)

## 2018-04-18 MED ORDER — METOPROLOL TARTRATE 50 MG PO TABS: 50.0000 mg | ORAL_TABLET | Freq: Two times a day (BID) | ORAL | 1 refills | Status: DC

## 2018-04-18 MED ORDER — INSULIN GLARGINE 300 UNIT/ML ~~LOC~~ SOPN: 50.0000 [IU] | PEN_INJECTOR | Freq: Two times a day (BID) | SUBCUTANEOUS | 2 refills | Status: DC

## 2018-04-18 MED ORDER — FLUCONAZOLE 150 MG PO TABS: 150.0000 mg | ORAL_TABLET | Freq: Once | ORAL | 0 refills | Status: AC

## 2018-04-18 MED ORDER — POTASSIUM CHLORIDE CRYS ER 20 MEQ PO TBCR: 40.0000 meq | EXTENDED_RELEASE_TABLET | Freq: Every day | ORAL | 2 refills | Status: DC

## 2018-04-18 MED ORDER — CYCLOBENZAPRINE HCL 5 MG PO TABS: ORAL_TABLET | ORAL | 1 refills | Status: DC

## 2018-04-18 MED ORDER — ATORVASTATIN CALCIUM 40 MG PO TABS: 40.0000 mg | ORAL_TABLET | Freq: Every day | ORAL | 3 refills | Status: DC

## 2018-04-18 MED ORDER — FREESTYLE LIBRE 14 DAY SENSOR MISC: 1.0000 "application " | 4 refills | Status: DC

## 2018-04-18 MED ORDER — LIRAGLUTIDE 18 MG/3ML ~~LOC~~ SOPN: PEN_INJECTOR | SUBCUTANEOUS | 1 refills | Status: DC

## 2018-04-18 MED ORDER — HYDROCODONE-ACETAMINOPHEN 5-325 MG PO TABS: 2.0000 | ORAL_TABLET | ORAL | 0 refills | Status: DC | PRN

## 2018-04-18 MED ORDER — GEMFIBROZIL 600 MG PO TABS: 600.0000 mg | ORAL_TABLET | Freq: Two times a day (BID) | ORAL | 1 refills | Status: AC

## 2018-04-18 MED ORDER — DOXYCYCLINE HYCLATE 100 MG PO TABS: 100.0000 mg | ORAL_TABLET | Freq: Two times a day (BID) | ORAL | 0 refills | Status: DC

## 2018-04-18 MED ORDER — CELECOXIB 200 MG PO CAPS: 200.0000 mg | ORAL_CAPSULE | Freq: Every day | ORAL | 0 refills | Status: DC

## 2018-04-18 MED ORDER — NOVOTWIST 32G X 5 MM MISC: 3 refills | Status: DC

## 2018-04-18 MED ORDER — FUROSEMIDE 20 MG PO TABS: 40.0000 mg | ORAL_TABLET | Freq: Every day | ORAL | 2 refills | Status: DC

## 2018-04-18 MED ORDER — FLUOXETINE HCL 20 MG PO CAPS: 20.0000 mg | ORAL_CAPSULE | Freq: Two times a day (BID) | ORAL | 1 refills | Status: DC

## 2018-04-18 NOTE — Patient Instructions (Addendum)
  [place], [place], [place] [address] Phone: [phone]  Glucose - fasting, before meals and 2 hours after meal and before bedtime  IF you received an x-ray today, you will receive an invoice from [facility]. Please contact [facility] at [phone] with questions or concerns regarding your invoice.   IF you received labwork today, you will receive an invoice from [facility]. Please contact [facility] at [phone] with questions or concerns regarding your invoice.   Our billing staff will not be able to assist you with questions regarding bills from these companies.  You will be contacted with the lab results as soon as they are available. The fastest way to get your results is to activate your My Chart account. Instructions are located on the last page of this paperwork. If you have not heard from [place] regarding the results in 2 weeks, please contact this office.

## 2018-04-18 NOTE — Progress Notes (Signed)
[name]  MRN: [id number] DOB: 15-Apr-1966  PCP: Mancel Bale, PA-C  Chief Complaint  Patient presents with  . Diabetes    follow up  . Medication Refill  . Tick Removal    on stomach     Subjective:  Pt presents to clinic for medication refills and discussion on her diabetes.  She also removed a tick from her stomach 4 days ago and has local erythema and itching but she is starting to have a lot of body aches.  Patient has been diagnosed with Lyme's disease in the past and had very similar symptoms after the Lyme's disease treatment with doxycycline her symptoms resolved.  The tick that the patient removed was very very small and they had trouble removing it.  Patient has continue to monitor her glucose she feels that overall her numbers are better.  She started Novolin but had swelling in her legs after starting it and she has discontinued that over the last month and in all her glucose readings that have been about the same.  She did continue to increase her Toujeo and she is now 105 units nightly.  She is having no lows overnight. Glucose after increase of Toujeo. Fasting 215 After eating -not checking consistently  Patient is perhaps interested in some type of bariatric surgery she has been able to maintain her weight even though she has known stress eating and overeating.  She would like to have some help with that her chronic medical conditions that are definitively related to her weight.  History is obtained by patient.  Review of Systems  Constitutional: Negative for chills.    Patient Active Problem List   Diagnosis Date Noted  . Acute bronchitis 02/21/2018  . CAP (community acquired pneumonia) 02/21/2018  . Colon, diverticulosis 07/01/2017  . Anxiety 03/08/2016  . Chest pain 02/20/2016  . Vitamin D insufficiency 09/25/2015  . Endometriosis 01/17/2014  . Sleep apnea with use of continuous positive airway pressure (CPAP) 06/27/2013  . Nocturia 06/27/2013    . Obesity, Class III, BMI 40-49.9 (morbid obesity) ([place])   . Obstructive sleep apnea 02/15/2013  . Hypersomnia, persistent   . HTN (hypertension) 02/03/2013  . DM (diabetes mellitus), type 2 ([place]) 02/03/2013    Current Outpatient Medications on File Prior to Visit  Medication Sig Dispense Refill  . albuterol (PROVENTIL HFA;VENTOLIN HFA) 108 (90 Base) MCG/ACT inhaler Inhale 2 puffs into the lungs every 6 (six) hours as needed for wheezing or shortness of breath. 1 Inhaler 2  . diazepam (VALIUM) 2 MG tablet Take 0.5-1 tablets (1-2 mg total) every 12 (twelve) hours as needed by mouth for anxiety or muscle spasms. 40 tablet 0  . promethazine (PHENERGAN) 25 MG tablet Take 1 tablet (25 mg total) by mouth every 8 (eight) hours as needed for nausea or vomiting. 10 tablet 0  . [DISCONTINUED] Ranitidine HCl (ZANTAC PO) Take 1 tablet by mouth 2 (two) times daily. Patient uses this medication for heartburn.     No current facility-administered medications on file prior to visit.     Allergies  Allergen Reactions  . Contrast Media [Iodinated Diagnostic Agents] Hives  . Darvocet [Propoxyphene N-Acetaminophen] Hives and Itching  . Lisinopril Cough  . Metformin And Related Other (See Comments)    Chest pain  . Morphine And Related Nausea And Vomiting  . Percocet [Oxycodone-Acetaminophen] Itching    And headache  . Robaxin [Methocarbamol] Other (See Comments)    Headache   . Wellbutrin [Bupropion Hcl] Other (  See Comments)    Hears voices  . Zofran [Ondansetron Hcl] Itching    Past Medical History:  Diagnosis Date  . Anxiety   . Arthritis   . Diabetes mellitus   . Endometriosis   . Hypersomnia, persistent    epworth 16   . Hypertension   . Lyme borreliosis   . Migraine   . Morbid obesity ([place])   . Obesity, Class III, BMI 40-49.9 (morbid obesity) ([place])   . Obstructive sleep apnea    CPAP NIGHTLY; sleep study 2007.  Marland Kitchen Renal disorder    kidney stone   Social History   Social  History Narrative   Marital status: married x 25 years.      Children:  One son (78), no grandchildren, 2 dogs      Lives: with husband, son.      Employment: Museum/gallery curator part       Tobacco: 1/2 ppd x 15 years.      Alcohol: socially; special ocassions.      Drugs: none      Exercise: none   Caffeine Use: 2 cups daily   Social History   Tobacco Use  . Smoking status: Former Smoker    Packs/day: 0.20    Years: 0.00    Pack years: 0.00    Types: Cigarettes  . Smokeless tobacco: Never Used  Substance Use Topics  . Alcohol use: No  . Drug use: No   family history includes Depression in her mother; Diabetes in her father, mother, and sister; Hypertension in her brother, father, and sister; Stroke in her mother; Thyroid disease in her mother.     Objective:  BP 134/78   Pulse 85   Temp 98.8 F (37.1 C) (Oral)   Resp 18   Ht _0  (1.575 m)   Wt 250 lb 6.4 oz (113.6 kg)   LMP 04/04/2018   SpO2 97%   BMI 45.80 kg/m  Body mass index is 45.8 kg/m.  Wt Readings from Last 3 Encounters:  04/18/18 250 lb 6.4 oz (113.6 kg)  03/07/18 240 lb (108.9 kg)  02/28/18 246 lb 11.1 oz (111.9 kg)    Physical Exam  Constitutional: She is oriented to person, place, and time. She appears well-developed and well-nourished.  HENT:  Head: Normocephalic and atraumatic.  Right Ear: Hearing and external ear normal.  Left Ear: Hearing and external ear normal.  Eyes: Conjunctivae are normal.  Neck: Normal range of motion.  Pulmonary/Chest: Effort normal.  Neurological: She is alert and oriented to person, place, and time.  Skin: Skin is warm, dry and intact.     Psychiatric: She has a normal mood and affect. Her behavior is normal. Judgment and thought content normal.  Vitals reviewed.   Assessment and Plan :  Essential hypertension - Plan: metoprolol tartrate (LOPRESSOR) 50 MG tablet -controlled today continue medications and monitoring at home  Other hyperlipidemia - Plan:  gemfibrozil (LOPID) 600 MG tablet, atorvastatin (LIPITOR) 40 MG tablet -check labs adjust if needed  Endometriosis - Plan: HYDROcodone-acetaminophen (NORCO/VICODIN) 5-325 MG tablet -uses around her monthly cycle no change in dose recently  Lumbar strain, initial encounter - Plan: cyclobenzaprine (FLEXERIL) 5 MG tablet, celecoxib (CELEBREX) 200 MG capsule -she has started seeing a chiropractor which has helped her lower back and neck and headaches.  She will continue doing this but needs medication refills for her additional relief.  Type 2 diabetes mellitus with hyperglycemia, with long-term current use of insulin ([place]) - Plan: liraglutide (  VICTOZA) 18 MG/3ML SOPN, Insulin Glargine (TOUJEO SOLOSTAR) 300 UNIT/ML SOPN, Continuous Blood Gluc Sensor (FREESTYLE LIBRE 14 DAY SENSOR) MISC, CMP14+EGFR -discussed with patient splitting her Toujeo up to 50 units in the morning 50 units at night to see if that will give her better control.  At this time she is not checking her blood sugars where we can determine whether she needs mealtime insulin but this is suspected.  She will really do a good job on checking fasting glucoses prior to meals and 2 hours after meals and at bedtime.  She does have the freestyle Elenor Legato so she is not having to stick her finger to do these multiple checks a day.  I believe that the patient will significantly benefit from a weight loss surgery.  Did discuss with patient her complication with her stress eating but she does feel like that she has this morning controlled now than she did several years ago.  Moderate single current episode of major depressive disorder ([place]) - Plan: FLUoxetine (PROZAC) 20 MG capsule -patient will continue current dose suspect that she could not a higher dose but is unable to tolerate the medicine at higher doses and does not want to change medications.  Her side effects has been fatigue though she now admits that the Valium is making her fatigue still possible  but that was actually the problem we will continue to monitor.  Stress reaction - Plan: FLUoxetine (PROZAC) 20 MG capsule  Type 2 diabetes mellitus without complication, without long-term current use of insulin (HCC) - Plan: NOVOTWIST 32G X 5 MM MISC  Bilateral leg edema - Plan: potassium chloride SA (K-DUR,KLOR-CON) 20 MEQ tablet, furosemide (LASIX) 20 MG tablet -patient continues to have leg swelling needing Lasix and will check labs to see if her potassium is adequately replenished.  Yeast infection - Plan: fluconazole (DIFLUCAN) 150 MG tablet  Tick bite, initial encounter - Plan: doxycycline (VIBRA-TABS) 100 MG tablet -patient already having symptoms as she did last time with Lyme's disease we will go ahead and start treatment though we will do no labs at this time due to too early for titers to be positive.  She will recheck with me in a month so we can do further determination of her diabetes treatment plan.  Patient verbalized to me that they understand the following: diagnosis, what is being done for them, what to expect and what should be done at home.  Their questions have been answered.  See after visit summary for patient specific instructions.  Windell Hummingbird PA-C  [facility] 04/18/2018 11:47 AM  Please note: Portions of this report may have been transcribed using dragon voice recognition software. Every effort was made to ensure accuracy; however, inadvertent computerized transcription errors may be present.

## 2018-04-19 ENCOUNTER — Other Ambulatory Visit: Payer: Self-pay | Admitting: Physician Assistant

## 2018-04-19 ENCOUNTER — Telehealth: Payer: Self-pay | Admitting: Physician Assistant

## 2018-04-19 DIAGNOSIS — N809 Endometriosis, unspecified: Secondary | ICD-10-CM

## 2018-04-19 DIAGNOSIS — E7849 Other hyperlipidemia: Secondary | ICD-10-CM

## 2018-04-19 NOTE — Telephone Encounter (Signed)
Copied from [place] [id number]. Topic: Quick Communication - See Telephone Encounter >> Apr 19, 2018  8:55 AM Conception Chancy, NT wrote: CRM for notification. See Telephone encounter for: 04/19/18.  Patient is calling and states [facility] was without power yesterday and she is needing all her prescriptions that were sent yesterday resent today. Patient would like a call from the office once they have been resent. Please advise.

## 2018-04-20 ENCOUNTER — Other Ambulatory Visit: Payer: Self-pay | Admitting: *Deleted

## 2018-04-20 DIAGNOSIS — I10 Essential (primary) hypertension: Secondary | ICD-10-CM

## 2018-04-20 DIAGNOSIS — E1165 Type 2 diabetes mellitus with hyperglycemia: Secondary | ICD-10-CM

## 2018-04-20 DIAGNOSIS — Z794 Long term (current) use of insulin: Principal | ICD-10-CM

## 2018-04-20 DIAGNOSIS — F430 Acute stress reaction: Secondary | ICD-10-CM

## 2018-04-20 DIAGNOSIS — S39012A Strain of muscle, fascia and tendon of lower back, initial encounter: Secondary | ICD-10-CM

## 2018-04-20 DIAGNOSIS — R600 Localized edema: Secondary | ICD-10-CM

## 2018-04-20 DIAGNOSIS — E119 Type 2 diabetes mellitus without complications: Secondary | ICD-10-CM

## 2018-04-20 DIAGNOSIS — F321 Major depressive disorder, single episode, moderate: Secondary | ICD-10-CM

## 2018-04-20 DIAGNOSIS — E7849 Other hyperlipidemia: Secondary | ICD-10-CM

## 2018-04-20 DIAGNOSIS — W57XXXA Bitten or stung by nonvenomous insect and other nonvenomous arthropods, initial encounter: Secondary | ICD-10-CM

## 2018-04-20 DIAGNOSIS — N809 Endometriosis, unspecified: Secondary | ICD-10-CM

## 2018-04-20 MED ORDER — INSULIN GLARGINE 300 UNIT/ML ~~LOC~~ SOPN: 50.0000 [IU] | PEN_INJECTOR | Freq: Two times a day (BID) | SUBCUTANEOUS | 2 refills | Status: DC

## 2018-04-20 MED ORDER — CYCLOBENZAPRINE HCL 5 MG PO TABS: ORAL_TABLET | ORAL | 1 refills | Status: DC

## 2018-04-20 MED ORDER — FUROSEMIDE 20 MG PO TABS
40.0000 mg | ORAL_TABLET | Freq: Every day | ORAL | 2 refills | Status: DC
Start: 2018-04-20 — End: 2018-05-23

## 2018-04-20 MED ORDER — POTASSIUM CHLORIDE CRYS ER 20 MEQ PO TBCR: 40.0000 meq | EXTENDED_RELEASE_TABLET | Freq: Every day | ORAL | 2 refills | Status: AC

## 2018-04-20 MED ORDER — FLUOXETINE HCL 20 MG PO CAPS: 20.0000 mg | ORAL_CAPSULE | Freq: Two times a day (BID) | ORAL | 1 refills | Status: AC

## 2018-04-20 MED ORDER — NOVOTWIST 32G X 5 MM MISC: 3 refills | Status: AC

## 2018-04-20 MED ORDER — LIRAGLUTIDE 18 MG/3ML ~~LOC~~ SOPN: PEN_INJECTOR | SUBCUTANEOUS | 1 refills | Status: DC

## 2018-04-20 MED ORDER — CELECOXIB 200 MG PO CAPS: 200.0000 mg | ORAL_CAPSULE | Freq: Every day | ORAL | 0 refills | Status: DC

## 2018-04-20 MED ORDER — FREESTYLE LIBRE 14 DAY SENSOR MISC: 1.0000 "application " | 4 refills | Status: AC

## 2018-04-20 MED ORDER — METOPROLOL TARTRATE 50 MG PO TABS: 50.0000 mg | ORAL_TABLET | Freq: Two times a day (BID) | ORAL | 1 refills | Status: AC

## 2018-04-20 MED ORDER — DOXYCYCLINE HYCLATE 100 MG PO TABS: 100.0000 mg | ORAL_TABLET | Freq: Two times a day (BID) | ORAL | 0 refills | Status: DC

## 2018-04-20 NOTE — Telephone Encounter (Signed)
Pended meds

## 2018-04-20 NOTE — Telephone Encounter (Addendum)
I cannot see pended and I cannot reorder - please help me find the pended medications.

## 2018-04-22 ENCOUNTER — Telehealth: Payer: Self-pay | Admitting: Physician Assistant

## 2018-04-22 NOTE — Telephone Encounter (Signed)
Pt. Called requesting refill on Hydrocodone and some non-specified UTI medication. Pt asserted medication was supposed to be resent with other scripts on the 25th due to pharmacy's power being out.

## 2018-04-24 ENCOUNTER — Other Ambulatory Visit: Payer: Self-pay | Admitting: *Deleted

## 2018-04-24 DIAGNOSIS — N809 Endometriosis, unspecified: Secondary | ICD-10-CM

## 2018-04-24 DIAGNOSIS — E7849 Other hyperlipidemia: Secondary | ICD-10-CM

## 2018-04-24 MED ORDER — HYDROCODONE-ACETAMINOPHEN 5-325 MG PO TABS: 2.0000 | ORAL_TABLET | ORAL | 0 refills | Status: DC | PRN

## 2018-04-24 MED ORDER — DOXYCYCLINE HYCLATE 100 MG PO TABS: 100.0000 mg | ORAL_TABLET | Freq: Two times a day (BID) | ORAL | 0 refills | Status: DC

## 2018-04-24 MED ORDER — ATORVASTATIN CALCIUM 40 MG PO TABS: 40.0000 mg | ORAL_TABLET | Freq: Every day | ORAL | 3 refills | Status: AC

## 2018-04-24 NOTE — Telephone Encounter (Signed)
Yes are having trouble with medication.

## 2018-04-24 NOTE — Addendum Note (Signed)
Addended by: Mancel Bale on: 04/24/2018 03:23 PM   Modules accepted: Orders

## 2018-04-25 ENCOUNTER — Telehealth: Payer: Self-pay | Admitting: Physician Assistant

## 2018-04-25 ENCOUNTER — Encounter: Payer: Self-pay | Admitting: Radiology

## 2018-04-25 MED ORDER — FLUCONAZOLE 150 MG PO TABS: 150.0000 mg | ORAL_TABLET | Freq: Once | ORAL | 0 refills | Status: AC

## 2018-04-25 NOTE — Telephone Encounter (Signed)
Please see note below. 

## 2018-04-25 NOTE — Telephone Encounter (Signed)
Copied from [place] [id number]. Topic: Quick Communication - See Telephone Encounter >> Apr 25, 2018  8:54 AM Ahmed Prima L wrote: CRM for notification. See Telephone encounter for: 04/25/18.  Patient states that she was in on 7/23 and Dr Gale Journey was going to call her in a one time pill for a yeast infection and the pharmacy does not have anything there for her.  [id number] [place], [place] - [address] [place] [address] Phone: [phone] Fax: [phone]

## 2018-04-25 NOTE — Telephone Encounter (Signed)
DOne - it was sent when the power was out at the pharmacy.  I have sent it in again for the patient.

## 2018-04-25 NOTE — Telephone Encounter (Signed)
Called and notified pt that prescription was resent to the pharmacy. Pt verbalized understanding.

## 2018-04-26 ENCOUNTER — Other Ambulatory Visit: Payer: Self-pay | Admitting: *Deleted

## 2018-04-26 DIAGNOSIS — E7849 Other hyperlipidemia: Secondary | ICD-10-CM

## 2018-04-27 ENCOUNTER — Telehealth: Payer: Self-pay

## 2018-05-08 DIAGNOSIS — M9901 Segmental and somatic dysfunction of cervical region: Secondary | ICD-10-CM | POA: Diagnosis not present

## 2018-05-08 DIAGNOSIS — M5431 Sciatica, right side: Secondary | ICD-10-CM | POA: Diagnosis not present

## 2018-05-10 DIAGNOSIS — M9901 Segmental and somatic dysfunction of cervical region: Secondary | ICD-10-CM | POA: Diagnosis not present

## 2018-05-10 DIAGNOSIS — M5431 Sciatica, right side: Secondary | ICD-10-CM | POA: Diagnosis not present

## 2018-05-23 ENCOUNTER — Encounter: Payer: Self-pay | Admitting: Physician Assistant

## 2018-05-23 ENCOUNTER — Ambulatory Visit: Payer: BLUE CROSS/BLUE SHIELD | Admitting: Physician Assistant

## 2018-05-23 ENCOUNTER — Other Ambulatory Visit: Payer: Self-pay

## 2018-05-23 VITALS — BP 144/72 | HR 101 | Temp 98.2°F | Resp 18 | Ht 62.0 in | Wt 256.2 lb

## 2018-05-23 DIAGNOSIS — M5441 Lumbago with sciatica, right side: Secondary | ICD-10-CM

## 2018-05-23 DIAGNOSIS — Z794 Long term (current) use of insulin: Secondary | ICD-10-CM

## 2018-05-23 DIAGNOSIS — S39012A Strain of muscle, fascia and tendon of lower back, initial encounter: Secondary | ICD-10-CM | POA: Diagnosis not present

## 2018-05-23 DIAGNOSIS — E1165 Type 2 diabetes mellitus with hyperglycemia: Secondary | ICD-10-CM | POA: Diagnosis not present

## 2018-05-23 DIAGNOSIS — N809 Endometriosis, unspecified: Secondary | ICD-10-CM

## 2018-05-23 DIAGNOSIS — F321 Major depressive disorder, single episode, moderate: Secondary | ICD-10-CM | POA: Diagnosis not present

## 2018-05-23 DIAGNOSIS — R600 Localized edema: Secondary | ICD-10-CM

## 2018-05-23 DIAGNOSIS — E7800 Pure hypercholesterolemia, unspecified: Secondary | ICD-10-CM

## 2018-05-23 DIAGNOSIS — I10 Essential (primary) hypertension: Secondary | ICD-10-CM

## 2018-05-23 LAB — CMP14+EGFR
A/G RATIO: 1.5 (ref 1.2–2.2)
ALK PHOS: 103 IU/L (ref 39–117)
ALT: 26 IU/L (ref 0–32)
AST: 18 IU/L (ref 0–40)
Albumin: 4.1 g/dL (ref 3.5–5.5)
BILIRUBIN TOTAL: 0.2 mg/dL (ref 0.0–1.2)
BUN/Creatinine Ratio: 24 — ABNORMAL HIGH (ref 9–23)
BUN: 18 mg/dL (ref 6–24)
CHLORIDE: 102 mmol/L (ref 96–106)
CO2: 19 mmol/L — ABNORMAL LOW (ref 20–29)
Calcium: 9.3 mg/dL (ref 8.7–10.2)
Creatinine, Ser: 0.75 mg/dL (ref 0.57–1.00)
GFR calc Af Amer: 107 mL/min/{1.73_m2} (ref >59)
GFR calc non Af Amer: 93 mL/min/{1.73_m2} (ref >59)
Globulin, Total: 2.7 g/dL (ref 1.5–4.5)
Glucose: 216 mg/dL — ABNORMAL HIGH (ref 65–99)
POTASSIUM: 4.4 mmol/L (ref 3.5–5.2)
Sodium: 137 mmol/L (ref 134–144)
Total Protein: 6.8 g/dL (ref 6.0–8.5)

## 2018-05-23 LAB — LIPID PANEL
Chol/HDL Ratio: 8 ratio — ABNORMAL HIGH (ref 0.0–4.4)
Cholesterol, Total: 271 mg/dL — ABNORMAL HIGH (ref 100–199)
HDL: 34 mg/dL — ABNORMAL LOW (ref >39)
Triglycerides: 494 mg/dL — ABNORMAL HIGH (ref 0–149)

## 2018-05-23 LAB — HEMOGLOBIN A1C
ESTIMATED AVERAGE GLUCOSE: 189 mg/dL
HEMOGLOBIN A1C: 8.2 % — AB (ref 4.8–5.6)

## 2018-05-23 MED ORDER — METHYLPREDNISOLONE ACETATE 80 MG/ML IJ SUSP
80.0000 mg | Freq: Once | INTRAMUSCULAR | Status: AC
  Administered 2018-05-23: 80 mg via INTRAMUSCULAR

## 2018-05-23 MED ORDER — HYDROCODONE-ACETAMINOPHEN 5-325 MG PO TABS: 2.0000 | ORAL_TABLET | ORAL | 0 refills | Status: DC | PRN

## 2018-05-23 MED ORDER — CYCLOBENZAPRINE HCL 5 MG PO TABS
ORAL_TABLET | ORAL | 1 refills | Status: DC
Start: 2018-05-23 — End: 2018-07-12

## 2018-05-23 MED ORDER — HYDROCODONE-ACETAMINOPHEN 5-325 MG PO TABS: 1.0000 | ORAL_TABLET | Freq: Four times a day (QID) | ORAL | 0 refills | Status: DC | PRN

## 2018-05-23 MED ORDER — FUROSEMIDE 20 MG PO TABS: 40.0000 mg | ORAL_TABLET | Freq: Every day | ORAL | 0 refills | Status: AC

## 2018-05-23 MED ORDER — INSULIN GLARGINE 300 UNIT/ML ~~LOC~~ SOPN: 50.0000 [IU] | PEN_INJECTOR | Freq: Two times a day (BID) | SUBCUTANEOUS | 2 refills | Status: AC

## 2018-05-23 MED ORDER — LIRAGLUTIDE 18 MG/3ML ~~LOC~~ SOPN: 1.8000 mg | PEN_INJECTOR | Freq: Every day | SUBCUTANEOUS | 3 refills | Status: DC

## 2018-05-23 NOTE — Progress Notes (Signed)
[name]  MRN: [id number] DOB: [birth year]  PCP: Mancel Bale, PA-C  Chief Complaint  Patient presents with  . Diabetes    med check  . Back Pain  . Medication Refill    Subjective:  Pt presents to clinic for recheck of DM and medication refills.  She is having a lot of problems with her back and pain radiating down her right leg from her sciatic nerve.  She has had this before and she has been using her muscle relaxers but she is still having the burning pain.  Glucose at home  Fasting - 112s 2 hrs after eating - 160s-210s - trying to eat better - she does note that when she eats better her glucose is lower  BP at home - 128/72-90 (when she has pain it is higher)  Eats 10am, 12pm lunch, dinner at 7pm - trying to schedule these doses.  She has started walking but her low back and sciatic nerve started to hurt her again.  She had been going to the chiropractor and it is better.  She is taking her flexeril 5 mg during the day and 77m at night.  She has had to be on prednisone for this before but it makes her mean and she was wondering if she could do an injection instead of pills.  History is obtained by patient.  Review of Systems  Constitutional: Negative for chills and fever.  Eyes: Negative for visual disturbance.  Respiratory: Negative for shortness of breath.   Cardiovascular: Negative for chest pain, palpitations and leg swelling.  Musculoskeletal: Positive for back pain (low back -  more on the right side) and gait problem (2nd to pain).  Neurological: Negative for dizziness, light-headedness and headaches.    Patient Active Problem List   Diagnosis Date Noted  . Colon, diverticulosis 07/01/2017  . Anxiety 03/08/2016  . Chest pain 02/20/2016  . Vitamin D insufficiency 09/25/2015  . Endometriosis 01/17/2014  . Sleep apnea with use of continuous positive airway pressure (CPAP) 06/27/2013  . Nocturia 06/27/2013  . Obesity, Class III, BMI 40-49.9 (morbid  obesity) ([place]   . Obstructive sleep apnea 02/15/2013  . Hypersomnia, persistent   . HTN (hypertension) 02/03/2013  . DM (diabetes mellitus), type 2 ([place] 02/03/2013    Current Outpatient Medications on File Prior to Visit  Medication Sig Dispense Refill  . albuterol (PROVENTIL HFA;VENTOLIN HFA) 108 (90 Base) MCG/ACT inhaler Inhale 2 puffs into the lungs every 6 (six) hours as needed for wheezing or shortness of breath. 1 Inhaler 2  . atorvastatin (LIPITOR) 40 MG tablet Take 1 tablet (40 mg total) by mouth daily. 90 tablet 3  . celecoxib (CELEBREX) 200 MG capsule Take 1 capsule (200 mg total) by mouth daily. 90 capsule 0  . Continuous Blood Gluc Sensor (FREESTYLE LIBRE 14 DAY SENSOR) MISC 1 application by Does not apply route every 14 (fourteen) days. 6 each 4  . diazepam (VALIUM) 2 MG tablet Take 0.5-1 tablets (1-2 mg total) every 12 (twelve) hours as needed by mouth for anxiety or muscle spasms. 40 tablet 0  . doxycycline (VIBRA-TABS) 100 MG tablet Take 1 tablet (100 mg total) by mouth 2 (two) times daily. 20 tablet 0  . FLUoxetine (PROZAC) 20 MG capsule Take 1 capsule (20 mg total) by mouth 2 (two) times daily. 180 capsule 1  . gemfibrozil (LOPID) 600 MG tablet Take 1 tablet (600 mg total) by mouth 2 (two) times daily before a meal. 180 tablet 1  .  metoprolol tartrate (LOPRESSOR) 50 MG tablet Take 1 tablet (50 mg total) by mouth 2 (two) times daily. 90 tablet 1  . NOVOTWIST 32G X 5 MM MISC USE 1 APPLICATION BY DOES NOT APPLY ROUTE DAILY. 200 each 3  . potassium chloride SA (K-DUR,KLOR-CON) 20 MEQ tablet Take 2 tablets (40 mEq total) by mouth daily. 60 tablet 2  . promethazine (PHENERGAN) 25 MG tablet Take 1 tablet (25 mg total) by mouth every 8 (eight) hours as needed for nausea or vomiting. 10 tablet 0  . [DISCONTINUED] Ranitidine HCl (ZANTAC PO) Take 1 tablet by mouth 2 (two) times daily. Patient uses this medication for heartburn.     No current facility-administered medications on file  prior to visit.     Allergies  Allergen Reactions  . Contrast Media [Iodinated Diagnostic Agents] Hives  . Darvocet [Propoxyphene N-Acetaminophen] Hives and Itching  . Lisinopril Cough  . Metformin And Related Other (See Comments)    Chest pain  . Morphine And Related Nausea And Vomiting  . Percocet [Oxycodone-Acetaminophen] Itching    And headache  . Robaxin [Methocarbamol] Other (See Comments)    Headache   . Wellbutrin [Bupropion Hcl] Other (See Comments)    Hears voices  . Zofran [Ondansetron Hcl] Itching    Past Medical History:  Diagnosis Date  . Anxiety   . Arthritis   . Diabetes mellitus   . Endometriosis   . Hypersomnia, persistent    epworth 16   . Hypertension   . Lyme borreliosis   . Migraine   . Morbid obesity (H[facility])   . Obesity, Class III, BMI 40-49.9 (morbid obesity) ([facility])   . Obstructive sleep apnea    CPAP NIGHTLY; sleep study 2007.  . Renal disorder    kidney stone   Social History   Social History Narrative   Marital status: married x 25 years.      Children:  One son (22), no grandchildren, 6 dogs      Lives: with husband, son.      Employment: [employer]       Tobacco: 1/2 ppd x 15 years.      Alcohol: socially; special ocassions.      Drugs: none      Exercise: none   Caffeine Use: 2 cups daily   Social History   Tobacco Use  . Smoking status: Former Smoker    Packs/day: 0.20    Years: 0.00    Pack years: 0.00    Types: Cigarettes  . Smokeless tobacco: Never Used  Substance Use Topics  . Alcohol use: No  . Drug use: No   family history includes Depression in her mother; Diabetes in her father, mother, and sister; Hypertension in her brother, father, and sister; Stroke in her mother; Thyroid disease in her mother.     Objective:  BP (!) 144/72   Pulse (!) 101   Temp 98.2 F (36.8 C) (Oral)   Resp 18   Ht 5' 2" (1.575 m)   Wt 256 lb 3.2 oz (116.2 kg)   LMP 05/23/2018   SpO2 98%   BMI 46.86 kg/m  Body mass index  is 46.86 kg/m.  Wt Readings from Last 3 Encounters:  05/23/18 256 lb 3.2 oz (116.2 kg)  04/18/18 250 lb 6.4 oz (113.6 kg)  03/07/18 240 lb (108.9 kg)    Physical Exam  Constitutional: She is oriented to person, place, and time. She appears well-developed and well-nourished.  HENT:  Head: Normocephalic and   atraumatic.  Right Ear: Hearing and external ear normal.  Left Ear: Hearing and external ear normal.  Eyes: Conjunctivae are normal.  Neck: Normal range of motion.  Cardiovascular: Normal rate, regular rhythm and normal heart sounds.  No murmur heard. Pulmonary/Chest: Effort normal and breath sounds normal.  Musculoskeletal:       Lumbar back: She exhibits decreased range of motion, tenderness and pain.       Back:       Right lower leg: She exhibits no edema.       Left lower leg: She exhibits no edema.  Neurological: She is alert and oriented to person, place, and time. She has normal strength. No sensory deficit.  DTR bilateral no able to get - patient cannot relax due to pain  Skin: Skin is warm and dry.  Psychiatric: She has a normal mood and affect. Her behavior is normal. Judgment and thought content normal.  Vitals reviewed.   Assessment and Plan :  Moderate single current episode of major depressive disorder ([place])- patient plans to decrease her Prozac she is not using the valium - she feels like she has found better ways to treat her depression and anxiety than with these mediations- she has reduced her dose and she has been doing fine.  Lumbar strain, initial encounter - Plan: cyclobenzaprine (FLEXERIL) 5 MG tablet - continue this medication.  Endometriosis - Plan: HYDROcodone-acetaminophen (NORCO/VICODIN) 5-325 MG tablet, HYDROcodone-acetaminophen (NORCO/VICODIN) 5-325 MG tablet, HYDROcodone-acetaminophen (NORCO/VICODIN) 5-325 MG tablet - 3 months of medication given to patient  Type 2 diabetes mellitus with hyperglycemia, with long-term current use of insulin  (HCC) - Plan: CMP14+EGFR, Hemoglobin A1c, liraglutide (VICTOZA) 18 MG/3ML SOPN, Insulin Glargine (TOUJEO SOLOSTAR) 300 UNIT/ML SOPN - check labs - she is still eating foods that she should not - but she does take her medications as planned - she does not bring in glucose readings to identify if meal time insulin would be healthful - the glucose are done by recall  Obesity, Class III, BMI 40-49.9 (morbid obesity) ([place]) - encouraged healthy lifestyle and eating  Essential hypertension - Plan: CMP14+EGFR - uncontrolled today likely due to pain but at home good readings  Pure hypercholesterolemia - Plan: Lipid panel - check labs  Acute right-sided low back pain with right-sided sciatica - Plan: methylPREDNISolone acetate (DEPO-MEDROL) injection 80 mg - pt understands that glucose may go up in the next few days and that this may not be long enough to make her pain go away - she really does not like pills though  Bilateral leg edema - Plan: furosemide (LASIX) 20 MG tablet - refilled medicaitons  Patient verbalized to me that they understand the following: diagnosis, what is being done for them, what to expect and what should be done at home.  Their questions have been answered.  See after visit summary for patient specific instructions.  Windell Hummingbird PA-C  [facility] 05/23/2018 12:08 PM  Please note: Portions of this report may have been transcribed using dragon voice recognition software. Every effort was made to ensure accuracy; however, inadvertent computerized transcription errors may be present.

## 2018-05-23 NOTE — Patient Instructions (Signed)
° ° ° °  If you have lab work done today you will be contacted with your lab results within the next 2 weeks.  If you have not heard from us then please contact us. The fastest way to get your results is to register for My Chart. ° ° °IF you received an x-ray today, you will receive an invoice from [facility]. Please contact [facility] at [phone] with questions or concerns regarding your invoice.  ° °IF you received labwork today, you will receive an invoice from [facility]. Please contact [facility] at [phone] with questions or concerns regarding your invoice.  ° °Our billing staff will not be able to assist you with questions regarding bills from these companies. ° °You will be contacted with the lab results as soon as they are available. The fastest way to get your results is to activate your My Chart account. Instructions are located on the last page of this paperwork. If you have not heard from us regarding the results in 2 weeks, please contact this office. °  ° ° ° °

## 2018-05-24 ENCOUNTER — Encounter: Payer: Self-pay | Admitting: Radiology

## 2018-05-30 DIAGNOSIS — M5431 Sciatica, right side: Secondary | ICD-10-CM | POA: Diagnosis not present

## 2018-05-30 DIAGNOSIS — M9901 Segmental and somatic dysfunction of cervical region: Secondary | ICD-10-CM | POA: Diagnosis not present

## 2018-06-01 ENCOUNTER — Telehealth: Payer: Self-pay | Admitting: Physician Assistant

## 2018-06-01 NOTE — Telephone Encounter (Signed)
Pt. Reports she was seen in office for sciatica and the Prednisone injection helped her pain for "about one day." Requesting the "Prednisone taper be called in." Uses [facility].

## 2018-06-01 NOTE — Telephone Encounter (Signed)
Attempted to contact pt regarding refill request for prednisone; medication is not on pt med list; ; pt was seen in office on 05/23/18 and given depo-medrol injection; left message on voicemail [phone]; unable to assess pt symptoms at this time; will also route to office for notification.

## 2018-06-01 NOTE — Telephone Encounter (Signed)
Please see note below. 

## 2018-06-01 NOTE — Telephone Encounter (Signed)
Copied from [place] [id number]. Topic: Quick Communication - Rx Refill/Question >> Jun 01, 2018 10:05 AM Oliver Pila B wrote: Medication: prednisone pt's shot of prednisone did not work, pt told to call back to get the Rx if the shot did not work  Has the patient contacted their pharmacy? Yes.   (Agent: If no, request that the patient contact the pharmacy for the refill.) (Agent: If yes, when and what did the pharmacy advise?)  Preferred Pharmacy (with phone number or street name): [facility]  Agent: Please be advised that RX refills may take up to 3 business days. We ask that you follow-up with your pharmacy.

## 2018-06-05 DIAGNOSIS — M5431 Sciatica, right side: Secondary | ICD-10-CM | POA: Diagnosis not present

## 2018-06-05 DIAGNOSIS — M9901 Segmental and somatic dysfunction of cervical region: Secondary | ICD-10-CM | POA: Diagnosis not present

## 2018-06-05 MED ORDER — PREDNISONE 10 MG PO TABS: ORAL_TABLET | ORAL | 0 refills | Status: AC

## 2018-06-05 NOTE — Telephone Encounter (Signed)
Sent to the pharmacy for her

## 2018-06-05 NOTE — Telephone Encounter (Signed)
Pt called to check status of medication; contact to advise

## 2018-06-05 NOTE — Addendum Note (Signed)
Addended by: Mancel Bale on: 06/05/2018 10:46 PM   Modules accepted: Orders

## 2018-06-13 DIAGNOSIS — M5431 Sciatica, right side: Secondary | ICD-10-CM | POA: Diagnosis not present

## 2018-06-13 DIAGNOSIS — M9901 Segmental and somatic dysfunction of cervical region: Secondary | ICD-10-CM | POA: Diagnosis not present

## 2018-06-21 DIAGNOSIS — M5431 Sciatica, right side: Secondary | ICD-10-CM | POA: Diagnosis not present

## 2018-06-21 DIAGNOSIS — M9901 Segmental and somatic dysfunction of cervical region: Secondary | ICD-10-CM | POA: Diagnosis not present

## 2018-06-27 DIAGNOSIS — M9901 Segmental and somatic dysfunction of cervical region: Secondary | ICD-10-CM | POA: Diagnosis not present

## 2018-06-27 DIAGNOSIS — M5431 Sciatica, right side: Secondary | ICD-10-CM | POA: Diagnosis not present

## 2018-07-06 DIAGNOSIS — M5431 Sciatica, right side: Secondary | ICD-10-CM | POA: Diagnosis not present

## 2018-07-06 DIAGNOSIS — M9901 Segmental and somatic dysfunction of cervical region: Secondary | ICD-10-CM | POA: Diagnosis not present

## 2018-07-10 DIAGNOSIS — M5431 Sciatica, right side: Secondary | ICD-10-CM | POA: Diagnosis not present

## 2018-07-10 DIAGNOSIS — M9901 Segmental and somatic dysfunction of cervical region: Secondary | ICD-10-CM | POA: Diagnosis not present

## 2018-07-12 ENCOUNTER — Encounter (HOSPITAL_BASED_OUTPATIENT_CLINIC_OR_DEPARTMENT_OTHER): Payer: Self-pay | Admitting: Emergency Medicine

## 2018-07-12 ENCOUNTER — Emergency Department (HOSPITAL_BASED_OUTPATIENT_CLINIC_OR_DEPARTMENT_OTHER): Payer: BLUE CROSS/BLUE SHIELD

## 2018-07-12 ENCOUNTER — Other Ambulatory Visit: Payer: Self-pay

## 2018-07-12 ENCOUNTER — Emergency Department (HOSPITAL_BASED_OUTPATIENT_CLINIC_OR_DEPARTMENT_OTHER)
Admission: EM | Admit: 2018-07-12 | Discharge: 2018-07-12 | Disposition: A | Discharge status: Home / Self Care | Payer: BLUE CROSS/BLUE SHIELD | Attending: Emergency Medicine | Admitting: Emergency Medicine

## 2018-07-12 DIAGNOSIS — S0990XA Unspecified injury of head, initial encounter: Secondary | ICD-10-CM | POA: Diagnosis not present

## 2018-07-12 DIAGNOSIS — R51 Headache: Secondary | ICD-10-CM | POA: Insufficient documentation

## 2018-07-12 DIAGNOSIS — I10 Essential (primary) hypertension: Secondary | ICD-10-CM | POA: Diagnosis not present

## 2018-07-12 DIAGNOSIS — S3992XA Unspecified injury of lower back, initial encounter: Secondary | ICD-10-CM | POA: Diagnosis not present

## 2018-07-12 DIAGNOSIS — R52 Pain, unspecified: Secondary | ICD-10-CM | POA: Diagnosis not present

## 2018-07-12 DIAGNOSIS — Y998 Other external cause status: Secondary | ICD-10-CM | POA: Insufficient documentation

## 2018-07-12 DIAGNOSIS — Z87891 Personal history of nicotine dependence: Secondary | ICD-10-CM | POA: Insufficient documentation

## 2018-07-12 DIAGNOSIS — M25551 Pain in right hip: Secondary | ICD-10-CM | POA: Insufficient documentation

## 2018-07-12 DIAGNOSIS — M545 Low back pain: Secondary | ICD-10-CM | POA: Diagnosis not present

## 2018-07-12 DIAGNOSIS — E119 Type 2 diabetes mellitus without complications: Secondary | ICD-10-CM | POA: Diagnosis not present

## 2018-07-12 DIAGNOSIS — M542 Cervicalgia: Secondary | ICD-10-CM | POA: Insufficient documentation

## 2018-07-12 DIAGNOSIS — G44309 Post-traumatic headache, unspecified, not intractable: Secondary | ICD-10-CM | POA: Diagnosis not present

## 2018-07-12 DIAGNOSIS — S6992XA Unspecified injury of left wrist, hand and finger(s), initial encounter: Secondary | ICD-10-CM | POA: Diagnosis not present

## 2018-07-12 DIAGNOSIS — Y92410 Unspecified street and highway as the place of occurrence of the external cause: Secondary | ICD-10-CM | POA: Diagnosis not present

## 2018-07-12 DIAGNOSIS — M25512 Pain in left shoulder: Secondary | ICD-10-CM

## 2018-07-12 DIAGNOSIS — S4992XA Unspecified injury of left shoulder and upper arm, initial encounter: Secondary | ICD-10-CM | POA: Diagnosis not present

## 2018-07-12 DIAGNOSIS — Y939 Activity, unspecified: Secondary | ICD-10-CM | POA: Insufficient documentation

## 2018-07-12 DIAGNOSIS — S79911A Unspecified injury of right hip, initial encounter: Secondary | ICD-10-CM | POA: Diagnosis not present

## 2018-07-12 DIAGNOSIS — M79642 Pain in left hand: Secondary | ICD-10-CM

## 2018-07-12 DIAGNOSIS — S199XXA Unspecified injury of neck, initial encounter: Secondary | ICD-10-CM | POA: Diagnosis not present

## 2018-07-12 DIAGNOSIS — Z79899 Other long term (current) drug therapy: Secondary | ICD-10-CM | POA: Insufficient documentation

## 2018-07-12 MED ORDER — FENTANYL CITRATE (PF) 100 MCG/2ML IJ SOLN
50.0000 ug | Freq: Once | INTRAMUSCULAR | Status: AC
  Administered 2018-07-12: 50 ug via INTRAVENOUS

## 2018-07-12 MED ORDER — IBUPROFEN 400 MG PO TABS
600.0000 mg | ORAL_TABLET | Freq: Once | ORAL | Status: AC
  Administered 2018-07-12: 600 mg via ORAL
  Filled 2018-07-12: qty 1

## 2018-07-12 MED ORDER — FENTANYL CITRATE (PF) 100 MCG/2ML IJ SOLN
50.0000 ug | Freq: Once | INTRAMUSCULAR | Status: DC
  Filled 2018-07-12: qty 2

## 2018-07-12 MED ORDER — FENTANYL CITRATE (PF) 100 MCG/2ML IJ SOLN
50.0000 ug | Freq: Once | INTRAMUSCULAR | Status: AC
  Administered 2018-07-12: 50 ug via INTRAVENOUS
  Filled 2018-07-12: qty 2

## 2018-07-12 MED ORDER — CYCLOBENZAPRINE HCL 10 MG PO TABS
10.0000 mg | ORAL_TABLET | Freq: Once | ORAL | Status: AC
  Administered 2018-07-12: 10 mg via ORAL
  Filled 2018-07-12: qty 1

## 2018-07-12 MED ORDER — ACETAMINOPHEN 325 MG PO TABS
650.0000 mg | ORAL_TABLET | Freq: Once | ORAL | Status: AC
  Administered 2018-07-12: 650 mg via ORAL
  Filled 2018-07-12: qty 2

## 2018-07-12 MED ORDER — CYCLOBENZAPRINE HCL 10 MG PO TABS: 10.0000 mg | ORAL_TABLET | Freq: Three times a day (TID) | ORAL | 0 refills | Status: DC | PRN

## 2018-07-12 NOTE — ED Notes (Signed)
Pt walked to restroom to urinate and missed hat when attempting urine sample.
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
## 2018-07-12 NOTE — ED Notes (Signed)
Pt moved to room 10. Assisted  pt to undress and put gown on. Pt reports now she has right hip, lower back and neck pain in addition to left shoulder pain.

## 2018-07-17 DIAGNOSIS — S46012A Strain of muscle(s) and tendon(s) of the rotator cuff of left shoulder, initial encounter: Secondary | ICD-10-CM | POA: Diagnosis not present

## 2018-07-17 DIAGNOSIS — M62512 Muscle wasting and atrophy, not elsewhere classified, left shoulder: Secondary | ICD-10-CM | POA: Diagnosis not present

## 2018-07-17 DIAGNOSIS — M19012 Primary osteoarthritis, left shoulder: Secondary | ICD-10-CM | POA: Diagnosis not present

## 2018-07-17 DIAGNOSIS — M25512 Pain in left shoulder: Secondary | ICD-10-CM | POA: Diagnosis not present

## 2018-07-17 DIAGNOSIS — E119 Type 2 diabetes mellitus without complications: Secondary | ICD-10-CM | POA: Diagnosis not present

## 2018-07-17 DIAGNOSIS — M25412 Effusion, left shoulder: Secondary | ICD-10-CM | POA: Diagnosis not present

## 2018-07-17 DIAGNOSIS — Z794 Long term (current) use of insulin: Secondary | ICD-10-CM | POA: Diagnosis not present

## 2018-07-17 DIAGNOSIS — M5030 Other cervical disc degeneration, unspecified cervical region: Secondary | ICD-10-CM | POA: Diagnosis not present

## 2018-07-28 DIAGNOSIS — M25512 Pain in left shoulder: Secondary | ICD-10-CM | POA: Diagnosis not present

## 2018-08-14 DIAGNOSIS — Z01818 Encounter for other preprocedural examination: Secondary | ICD-10-CM | POA: Diagnosis not present

## 2018-08-14 DIAGNOSIS — Z794 Long term (current) use of insulin: Secondary | ICD-10-CM | POA: Diagnosis not present

## 2018-08-14 DIAGNOSIS — I10 Essential (primary) hypertension: Secondary | ICD-10-CM | POA: Diagnosis not present

## 2018-08-14 DIAGNOSIS — E119 Type 2 diabetes mellitus without complications: Secondary | ICD-10-CM | POA: Diagnosis not present

## 2018-08-14 DIAGNOSIS — E782 Mixed hyperlipidemia: Secondary | ICD-10-CM | POA: Diagnosis not present

## 2018-08-24 IMAGING — CT CT ABD-PELV W/O CM
2 of 4 series · 16 of 46 positions shown, 18 images · non-contrast
Comparison: 11/27/2016

CLINICAL DATA: Right lower quadrant pain for 2 hours. Nausea
vomiting and diarrhea for 4 days. Hypertension. Diabetes.
Endometriosis.

EXAM:
CT ABDOMEN AND PELVIS WITHOUT CONTRAST
TECHNIQUE: Multidetector CT imaging of the abdomen and pelvis was performed
following the standard protocol without IV contrast.

[Series 2: axial st · axial · 0.98mm/px · z∈[+724,+1164]mm · 13 of 97 slices shown, 15 images]
[im 5/97  soft-tissue]
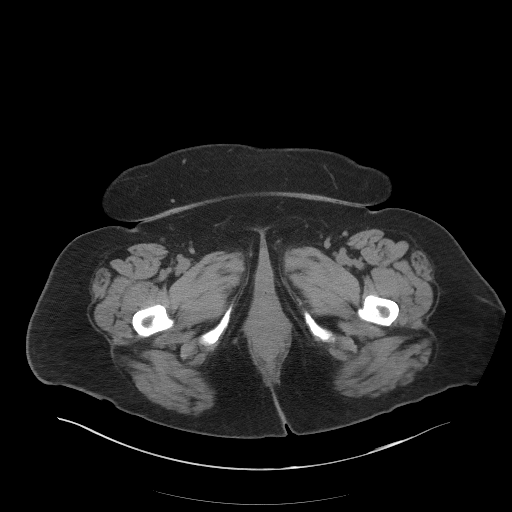
[im 5/97  bone]
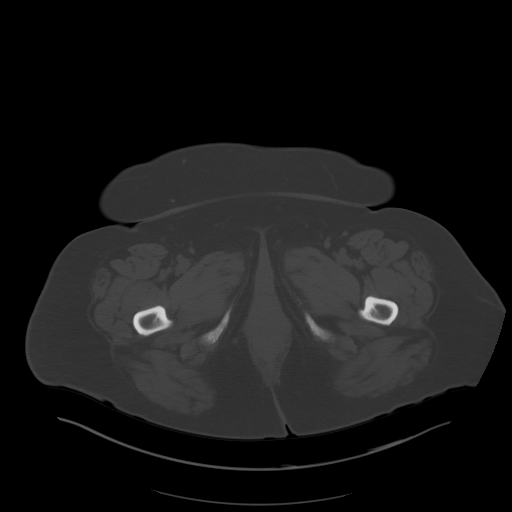
[im 13/97  soft-tissue]
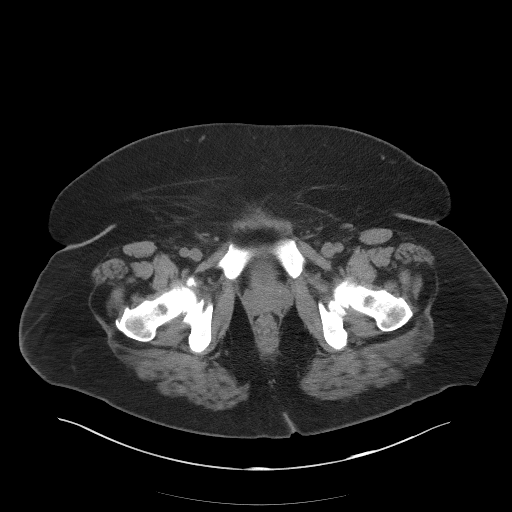
[im 21/97  soft-tissue]
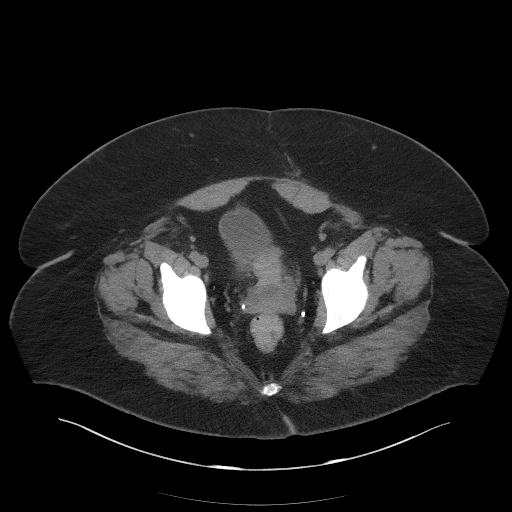
[im 29/97  soft-tissue]
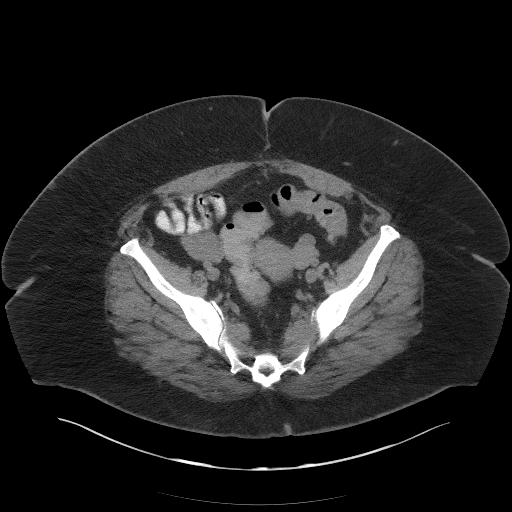
[im 33/97  soft-tissue]
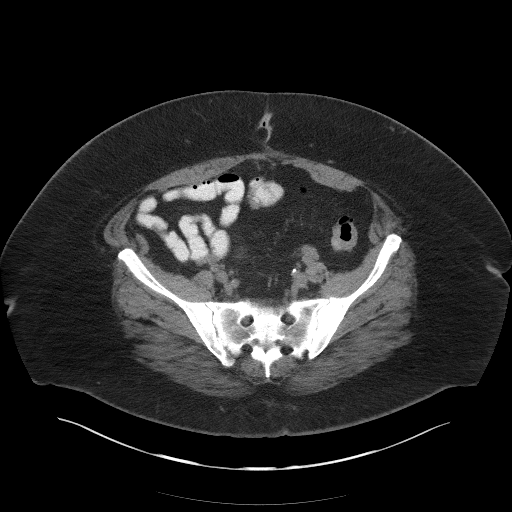
[im 41/97  soft-tissue]
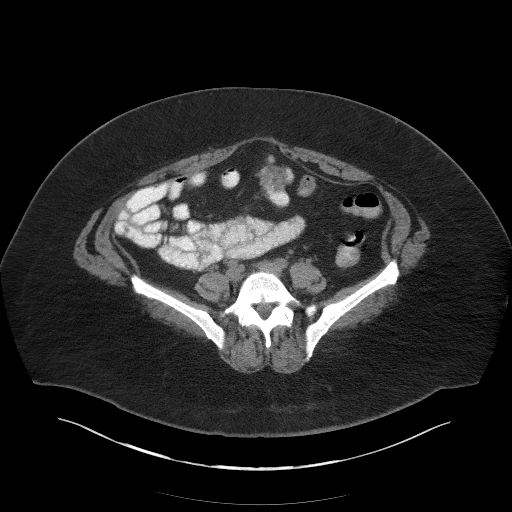
[im 49/97  soft-tissue]
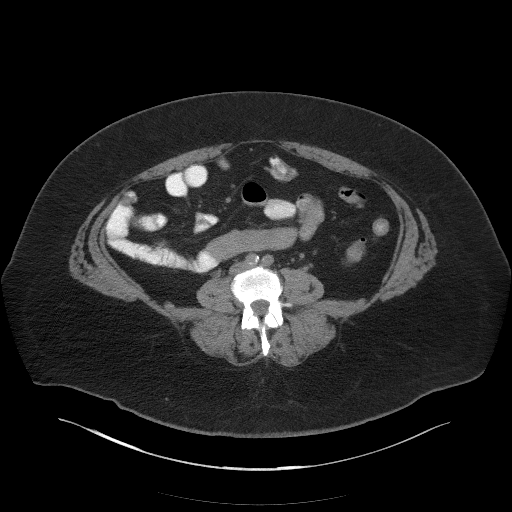
[im 57/97  soft-tissue]
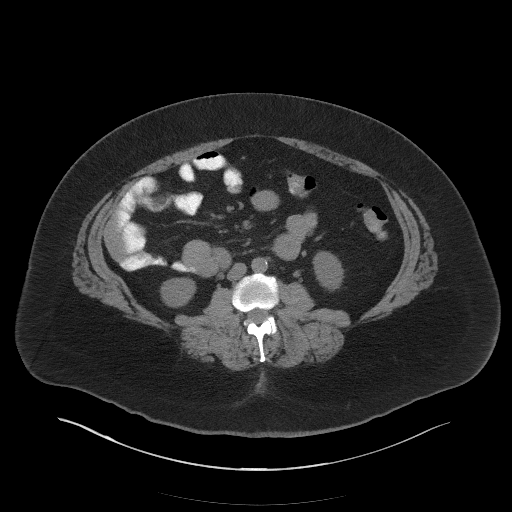
[im 65/97  soft-tissue]
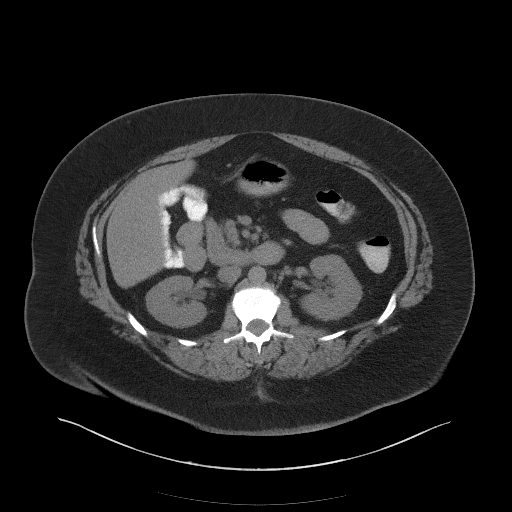
[im 65/97  bone]
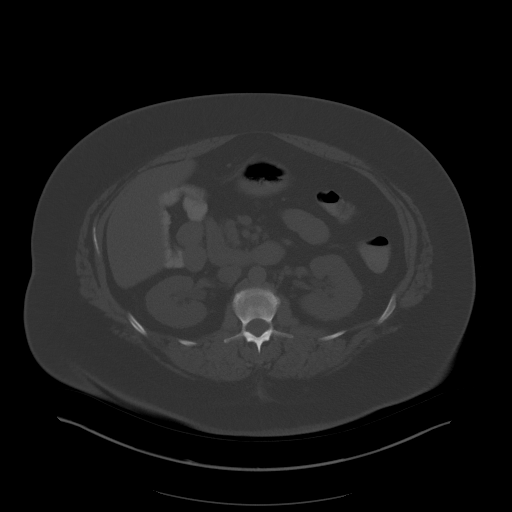
[im 69/97  soft-tissue]
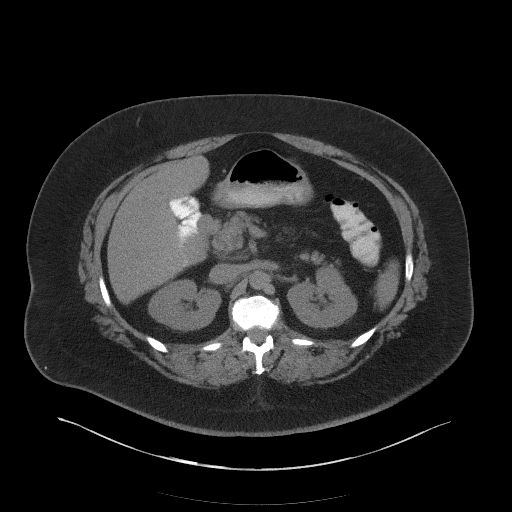
[im 77/97  soft-tissue]
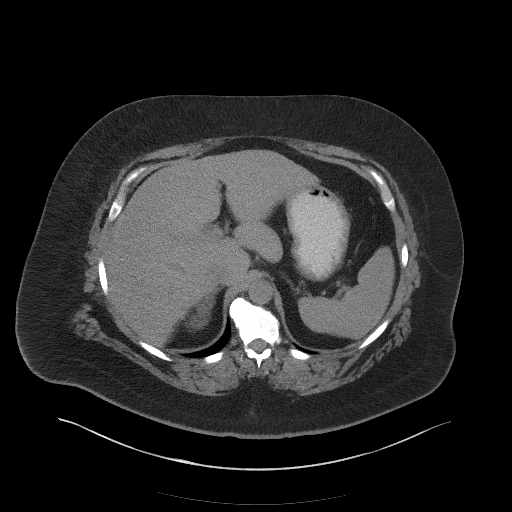
[im 85/97  soft-tissue]
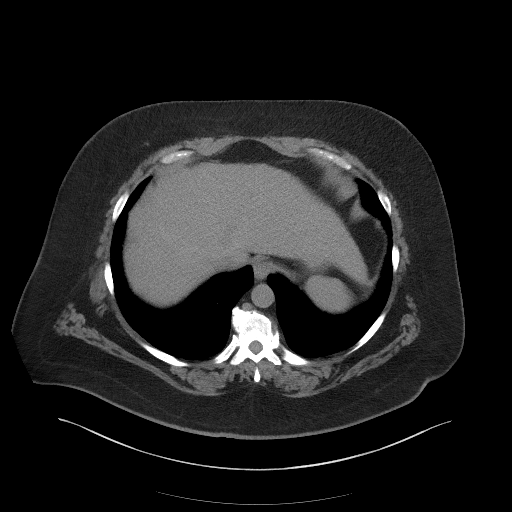
[im 93/97  soft-tissue]
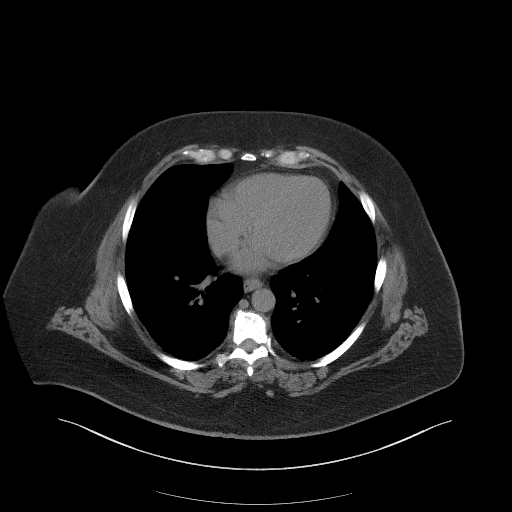

[Series 5: coronal st · coronal · 1.02mm/px · 3 of 105 slices shown]
[im 35/105  soft-tissue]
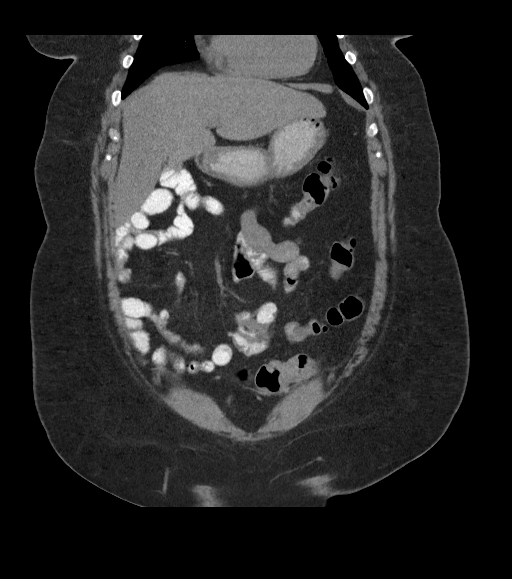
[im 47/105  soft-tissue]
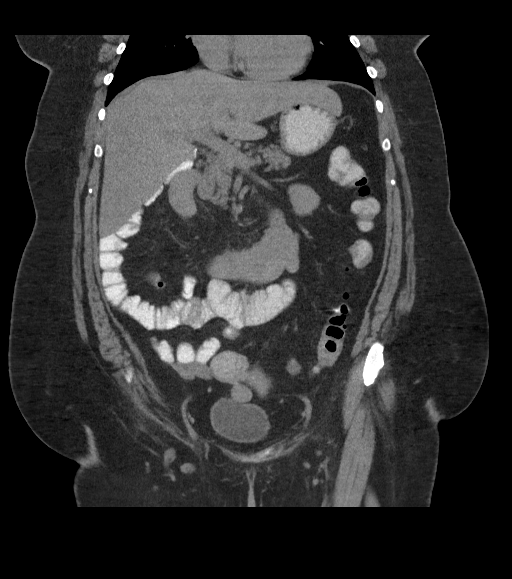
[im 58/105  soft-tissue]
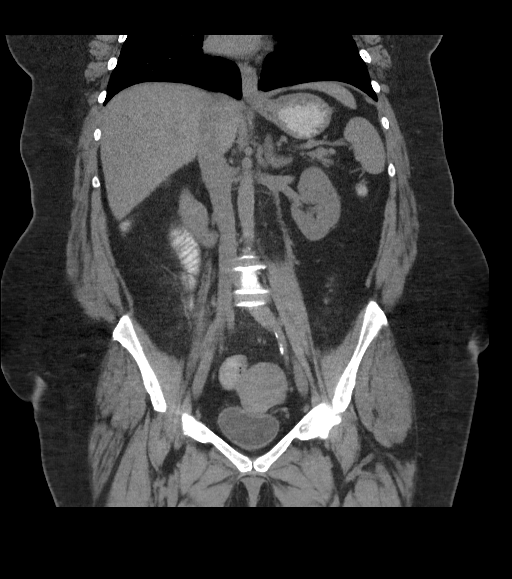

[16 of 46 positions shown; findings below may reference images not displayed]

FINDINGS: Lower chest: Clear lung bases. Normal heart size without pericardial
or pleural effusion.

Hepatobiliary: Caudate lobe enlargement. Cholecystectomy, without
biliary ductal dilatation.

Pancreas: Normal, without mass or ductal dilatation.

Spleen: Normal in size, without focal abnormality.

Adrenals/Urinary Tract: Normal adrenal glands. Punctate interpolar
right renal collecting system calculus. Normal left kidney. No
hydroureter or ureteric calculi. No bladder calculi.

Stomach/Bowel: Normal stomach, without wall thickening. Scattered
colonic diverticula. These small bowel is primarily positioned in
the right-sided abdomen, within the colon positioned in the left
side of the pelvis and abdomen. The ileocecal junction is in the
midline, including on image 61/series 2. Appendix not identified,
but no pericecal inflammation seen.

Normal terminal ileum.  Otherwise normal small bowel.

Vascular/Lymphatic: Aortic and branch vessel atherosclerosis.
SMA/SMV relationship is atypical, with the SMV positioned anterior
and just left of the SMA on image 32/series 2. No abdominopelvic
adenopathy.

Reproductive: Mildly lobulated the uterus, including on image
74/series 2. Normal adnexa for age.

Other: No significant free fluid.

Musculoskeletal: Bilateral hip osteoarthritis. Lumbosacral
spondylosis.
IMPRESSION: 1.  No acute process in the abdomen or pelvis.
2. Right nephrolithiasis.
3. Findings most consistent with small bowel malrotation. No
evidence of complicating obstruction.
4.  Aortic atherosclerosis.
5. Suspect at least 1 uterine fibroid.

## 2018-09-25 DIAGNOSIS — S46012D Strain of muscle(s) and tendon(s) of the rotator cuff of left shoulder, subsequent encounter: Secondary | ICD-10-CM | POA: Diagnosis not present

## 2018-09-25 DIAGNOSIS — M25512 Pain in left shoulder: Secondary | ICD-10-CM | POA: Diagnosis not present

## 2018-09-25 DIAGNOSIS — J0100 Acute maxillary sinusitis, unspecified: Secondary | ICD-10-CM | POA: Diagnosis not present

## 2018-10-05 DIAGNOSIS — M24112 Other articular cartilage disorders, left shoulder: Secondary | ICD-10-CM | POA: Diagnosis not present

## 2018-10-05 DIAGNOSIS — M7542 Impingement syndrome of left shoulder: Secondary | ICD-10-CM | POA: Diagnosis not present

## 2018-10-05 DIAGNOSIS — G8918 Other acute postprocedural pain: Secondary | ICD-10-CM | POA: Diagnosis not present

## 2018-10-05 DIAGNOSIS — M75122 Complete rotator cuff tear or rupture of left shoulder, not specified as traumatic: Secondary | ICD-10-CM | POA: Diagnosis not present

## 2018-10-05 DIAGNOSIS — S46012A Strain of muscle(s) and tendon(s) of the rotator cuff of left shoulder, initial encounter: Secondary | ICD-10-CM | POA: Diagnosis not present

## 2018-10-25 DIAGNOSIS — M25512 Pain in left shoulder: Secondary | ICD-10-CM | POA: Diagnosis not present

## 2018-11-15 DIAGNOSIS — S46012D Strain of muscle(s) and tendon(s) of the rotator cuff of left shoulder, subsequent encounter: Secondary | ICD-10-CM | POA: Diagnosis not present

## 2018-11-22 DIAGNOSIS — M25612 Stiffness of left shoulder, not elsewhere classified: Secondary | ICD-10-CM | POA: Diagnosis not present

## 2018-11-22 DIAGNOSIS — M75122 Complete rotator cuff tear or rupture of left shoulder, not specified as traumatic: Secondary | ICD-10-CM | POA: Diagnosis not present

## 2018-11-22 DIAGNOSIS — M25512 Pain in left shoulder: Secondary | ICD-10-CM | POA: Diagnosis not present

## 2018-11-27 DIAGNOSIS — M25512 Pain in left shoulder: Secondary | ICD-10-CM | POA: Diagnosis not present

## 2018-11-27 DIAGNOSIS — M25612 Stiffness of left shoulder, not elsewhere classified: Secondary | ICD-10-CM | POA: Diagnosis not present

## 2018-11-27 DIAGNOSIS — M75122 Complete rotator cuff tear or rupture of left shoulder, not specified as traumatic: Secondary | ICD-10-CM | POA: Diagnosis not present

## 2018-11-29 DIAGNOSIS — M25612 Stiffness of left shoulder, not elsewhere classified: Secondary | ICD-10-CM | POA: Diagnosis not present

## 2018-11-29 DIAGNOSIS — M75122 Complete rotator cuff tear or rupture of left shoulder, not specified as traumatic: Secondary | ICD-10-CM | POA: Diagnosis not present

## 2018-11-29 DIAGNOSIS — M25512 Pain in left shoulder: Secondary | ICD-10-CM | POA: Diagnosis not present

## 2018-12-06 DIAGNOSIS — M75122 Complete rotator cuff tear or rupture of left shoulder, not specified as traumatic: Secondary | ICD-10-CM | POA: Diagnosis not present

## 2018-12-06 DIAGNOSIS — M25512 Pain in left shoulder: Secondary | ICD-10-CM | POA: Diagnosis not present

## 2018-12-06 DIAGNOSIS — M25612 Stiffness of left shoulder, not elsewhere classified: Secondary | ICD-10-CM | POA: Diagnosis not present

## 2018-12-08 DIAGNOSIS — M25612 Stiffness of left shoulder, not elsewhere classified: Secondary | ICD-10-CM | POA: Diagnosis not present

## 2018-12-08 DIAGNOSIS — M25512 Pain in left shoulder: Secondary | ICD-10-CM | POA: Diagnosis not present

## 2018-12-08 DIAGNOSIS — M75122 Complete rotator cuff tear or rupture of left shoulder, not specified as traumatic: Secondary | ICD-10-CM | POA: Diagnosis not present

## 2018-12-19 DIAGNOSIS — M75122 Complete rotator cuff tear or rupture of left shoulder, not specified as traumatic: Secondary | ICD-10-CM | POA: Diagnosis not present

## 2018-12-19 DIAGNOSIS — M25612 Stiffness of left shoulder, not elsewhere classified: Secondary | ICD-10-CM | POA: Diagnosis not present

## 2018-12-19 DIAGNOSIS — M25512 Pain in left shoulder: Secondary | ICD-10-CM | POA: Diagnosis not present

## 2018-12-27 DIAGNOSIS — M25512 Pain in left shoulder: Secondary | ICD-10-CM | POA: Diagnosis not present

## 2018-12-27 DIAGNOSIS — M25612 Stiffness of left shoulder, not elsewhere classified: Secondary | ICD-10-CM | POA: Diagnosis not present

## 2018-12-27 DIAGNOSIS — M75122 Complete rotator cuff tear or rupture of left shoulder, not specified as traumatic: Secondary | ICD-10-CM | POA: Diagnosis not present

## 2018-12-29 DIAGNOSIS — S46012D Strain of muscle(s) and tendon(s) of the rotator cuff of left shoulder, subsequent encounter: Secondary | ICD-10-CM | POA: Diagnosis not present

## 2019-01-02 DIAGNOSIS — M75122 Complete rotator cuff tear or rupture of left shoulder, not specified as traumatic: Secondary | ICD-10-CM | POA: Diagnosis not present

## 2019-01-02 DIAGNOSIS — M25612 Stiffness of left shoulder, not elsewhere classified: Secondary | ICD-10-CM | POA: Diagnosis not present

## 2019-01-02 DIAGNOSIS — M25512 Pain in left shoulder: Secondary | ICD-10-CM | POA: Diagnosis not present

## 2019-01-24 DIAGNOSIS — M25612 Stiffness of left shoulder, not elsewhere classified: Secondary | ICD-10-CM | POA: Diagnosis not present

## 2019-01-24 DIAGNOSIS — M25512 Pain in left shoulder: Secondary | ICD-10-CM | POA: Diagnosis not present

## 2019-01-24 DIAGNOSIS — M75122 Complete rotator cuff tear or rupture of left shoulder, not specified as traumatic: Secondary | ICD-10-CM | POA: Diagnosis not present

## 2019-01-26 DIAGNOSIS — S46012D Strain of muscle(s) and tendon(s) of the rotator cuff of left shoulder, subsequent encounter: Secondary | ICD-10-CM | POA: Diagnosis not present

## 2019-01-26 DIAGNOSIS — M75122 Complete rotator cuff tear or rupture of left shoulder, not specified as traumatic: Secondary | ICD-10-CM | POA: Diagnosis not present

## 2019-04-24 DIAGNOSIS — H6121 Impacted cerumen, right ear: Secondary | ICD-10-CM | POA: Diagnosis not present

## 2019-04-24 DIAGNOSIS — Z794 Long term (current) use of insulin: Secondary | ICD-10-CM | POA: Diagnosis not present

## 2019-04-24 DIAGNOSIS — I10 Essential (primary) hypertension: Secondary | ICD-10-CM | POA: Diagnosis not present

## 2019-04-24 DIAGNOSIS — H9193 Unspecified hearing loss, bilateral: Secondary | ICD-10-CM | POA: Diagnosis not present

## 2019-04-24 DIAGNOSIS — E119 Type 2 diabetes mellitus without complications: Secondary | ICD-10-CM | POA: Diagnosis not present

## 2019-05-28 DIAGNOSIS — E559 Vitamin D deficiency, unspecified: Secondary | ICD-10-CM | POA: Diagnosis not present

## 2019-05-28 DIAGNOSIS — Z794 Long term (current) use of insulin: Secondary | ICD-10-CM | POA: Diagnosis not present

## 2019-05-28 DIAGNOSIS — K219 Gastro-esophageal reflux disease without esophagitis: Secondary | ICD-10-CM | POA: Diagnosis not present

## 2019-05-28 DIAGNOSIS — E785 Hyperlipidemia, unspecified: Secondary | ICD-10-CM | POA: Diagnosis not present

## 2019-05-28 DIAGNOSIS — J449 Chronic obstructive pulmonary disease, unspecified: Secondary | ICD-10-CM | POA: Diagnosis not present

## 2019-05-28 DIAGNOSIS — E1169 Type 2 diabetes mellitus with other specified complication: Secondary | ICD-10-CM | POA: Diagnosis not present

## 2019-05-28 DIAGNOSIS — R5383 Other fatigue: Secondary | ICD-10-CM | POA: Diagnosis not present

## 2019-07-10 DIAGNOSIS — H903 Sensorineural hearing loss, bilateral: Secondary | ICD-10-CM | POA: Diagnosis not present

## 2019-09-03 DIAGNOSIS — I10 Essential (primary) hypertension: Secondary | ICD-10-CM | POA: Diagnosis not present

## 2019-09-03 DIAGNOSIS — E1142 Type 2 diabetes mellitus with diabetic polyneuropathy: Secondary | ICD-10-CM | POA: Diagnosis not present

## 2019-09-03 DIAGNOSIS — E559 Vitamin D deficiency, unspecified: Secondary | ICD-10-CM | POA: Diagnosis not present

## 2019-09-03 DIAGNOSIS — Z23 Encounter for immunization: Secondary | ICD-10-CM | POA: Diagnosis not present

## 2019-09-03 DIAGNOSIS — E785 Hyperlipidemia, unspecified: Secondary | ICD-10-CM | POA: Diagnosis not present

## 2019-09-03 DIAGNOSIS — Z794 Long term (current) use of insulin: Secondary | ICD-10-CM | POA: Diagnosis not present

## 2019-09-03 DIAGNOSIS — E1159 Type 2 diabetes mellitus with other circulatory complications: Secondary | ICD-10-CM | POA: Diagnosis not present

## 2019-09-03 DIAGNOSIS — E1169 Type 2 diabetes mellitus with other specified complication: Secondary | ICD-10-CM | POA: Diagnosis not present

## 2019-10-18 DIAGNOSIS — Z6841 Body Mass Index (BMI) 40.0 and over, adult: Secondary | ICD-10-CM | POA: Diagnosis not present

## 2019-10-18 DIAGNOSIS — I10 Essential (primary) hypertension: Secondary | ICD-10-CM | POA: Diagnosis not present

## 2019-10-18 DIAGNOSIS — E1159 Type 2 diabetes mellitus with other circulatory complications: Secondary | ICD-10-CM | POA: Diagnosis not present

## 2019-10-18 DIAGNOSIS — M5431 Sciatica, right side: Secondary | ICD-10-CM | POA: Diagnosis not present

## 2019-10-18 DIAGNOSIS — M9901 Segmental and somatic dysfunction of cervical region: Secondary | ICD-10-CM | POA: Diagnosis not present

## 2019-10-18 DIAGNOSIS — G4730 Sleep apnea, unspecified: Secondary | ICD-10-CM | POA: Diagnosis not present

## 2019-10-18 DIAGNOSIS — E8881 Metabolic syndrome: Secondary | ICD-10-CM | POA: Diagnosis not present

## 2019-10-22 DIAGNOSIS — R609 Edema, unspecified: Secondary | ICD-10-CM | POA: Diagnosis not present

## 2019-10-22 DIAGNOSIS — E1159 Type 2 diabetes mellitus with other circulatory complications: Secondary | ICD-10-CM | POA: Diagnosis not present

## 2019-10-22 DIAGNOSIS — I10 Essential (primary) hypertension: Secondary | ICD-10-CM | POA: Diagnosis not present

## 2019-10-22 DIAGNOSIS — Z01818 Encounter for other preprocedural examination: Secondary | ICD-10-CM | POA: Diagnosis not present

## 2019-10-29 DIAGNOSIS — M9901 Segmental and somatic dysfunction of cervical region: Secondary | ICD-10-CM | POA: Diagnosis not present

## 2019-10-29 DIAGNOSIS — M5431 Sciatica, right side: Secondary | ICD-10-CM | POA: Diagnosis not present

## 2019-11-12 DIAGNOSIS — Z6841 Body Mass Index (BMI) 40.0 and over, adult: Secondary | ICD-10-CM | POA: Diagnosis not present

## 2019-11-12 DIAGNOSIS — M5431 Sciatica, right side: Secondary | ICD-10-CM | POA: Diagnosis not present

## 2019-11-12 DIAGNOSIS — M9901 Segmental and somatic dysfunction of cervical region: Secondary | ICD-10-CM | POA: Diagnosis not present

## 2019-11-12 DIAGNOSIS — Z7189 Other specified counseling: Secondary | ICD-10-CM | POA: Diagnosis not present

## 2019-11-12 DIAGNOSIS — F54 Psychological and behavioral factors associated with disorders or diseases classified elsewhere: Secondary | ICD-10-CM | POA: Diagnosis not present

## 2019-11-13 DIAGNOSIS — Z01810 Encounter for preprocedural cardiovascular examination: Secondary | ICD-10-CM | POA: Diagnosis not present

## 2019-11-13 DIAGNOSIS — J449 Chronic obstructive pulmonary disease, unspecified: Secondary | ICD-10-CM | POA: Diagnosis not present

## 2019-11-13 DIAGNOSIS — Z01818 Encounter for other preprocedural examination: Secondary | ICD-10-CM | POA: Diagnosis not present

## 2019-11-13 DIAGNOSIS — E669 Obesity, unspecified: Secondary | ICD-10-CM | POA: Diagnosis not present

## 2019-11-13 DIAGNOSIS — I10 Essential (primary) hypertension: Secondary | ICD-10-CM | POA: Diagnosis not present

## 2019-11-13 DIAGNOSIS — E1159 Type 2 diabetes mellitus with other circulatory complications: Secondary | ICD-10-CM | POA: Diagnosis not present

## 2019-11-13 DIAGNOSIS — E119 Type 2 diabetes mellitus without complications: Secondary | ICD-10-CM | POA: Diagnosis not present

## 2019-11-20 DIAGNOSIS — M9901 Segmental and somatic dysfunction of cervical region: Secondary | ICD-10-CM | POA: Diagnosis not present

## 2019-11-20 DIAGNOSIS — M5431 Sciatica, right side: Secondary | ICD-10-CM | POA: Diagnosis not present

## 2019-11-20 DIAGNOSIS — Z01818 Encounter for other preprocedural examination: Secondary | ICD-10-CM | POA: Diagnosis not present

## 2019-11-20 DIAGNOSIS — E1159 Type 2 diabetes mellitus with other circulatory complications: Secondary | ICD-10-CM | POA: Diagnosis not present

## 2019-11-20 DIAGNOSIS — I10 Essential (primary) hypertension: Secondary | ICD-10-CM | POA: Diagnosis not present

## 2019-11-21 DIAGNOSIS — Z713 Dietary counseling and surveillance: Secondary | ICD-10-CM | POA: Diagnosis not present

## 2019-12-03 DIAGNOSIS — M9901 Segmental and somatic dysfunction of cervical region: Secondary | ICD-10-CM | POA: Diagnosis not present

## 2019-12-03 DIAGNOSIS — M5431 Sciatica, right side: Secondary | ICD-10-CM | POA: Diagnosis not present

## 2019-12-09 DIAGNOSIS — Z01818 Encounter for other preprocedural examination: Secondary | ICD-10-CM | POA: Diagnosis not present

## 2019-12-10 DIAGNOSIS — M9901 Segmental and somatic dysfunction of cervical region: Secondary | ICD-10-CM | POA: Diagnosis not present

## 2019-12-10 DIAGNOSIS — M5431 Sciatica, right side: Secondary | ICD-10-CM | POA: Diagnosis not present

## 2019-12-12 DIAGNOSIS — G4710 Hypersomnia, unspecified: Secondary | ICD-10-CM | POA: Diagnosis not present

## 2019-12-12 DIAGNOSIS — E1169 Type 2 diabetes mellitus with other specified complication: Secondary | ICD-10-CM | POA: Diagnosis not present

## 2019-12-12 DIAGNOSIS — G4730 Sleep apnea, unspecified: Secondary | ICD-10-CM | POA: Diagnosis not present

## 2019-12-12 DIAGNOSIS — Z794 Long term (current) use of insulin: Secondary | ICD-10-CM | POA: Diagnosis not present

## 2019-12-12 DIAGNOSIS — J449 Chronic obstructive pulmonary disease, unspecified: Secondary | ICD-10-CM | POA: Diagnosis not present

## 2019-12-12 DIAGNOSIS — I10 Essential (primary) hypertension: Secondary | ICD-10-CM | POA: Diagnosis not present

## 2019-12-12 DIAGNOSIS — F419 Anxiety disorder, unspecified: Secondary | ICD-10-CM | POA: Diagnosis not present

## 2019-12-12 DIAGNOSIS — E8881 Metabolic syndrome: Secondary | ICD-10-CM | POA: Diagnosis not present

## 2019-12-12 DIAGNOSIS — K219 Gastro-esophageal reflux disease without esophagitis: Secondary | ICD-10-CM | POA: Diagnosis not present

## 2019-12-12 DIAGNOSIS — M1990 Unspecified osteoarthritis, unspecified site: Secondary | ICD-10-CM | POA: Diagnosis not present

## 2019-12-12 DIAGNOSIS — E119 Type 2 diabetes mellitus without complications: Secondary | ICD-10-CM | POA: Diagnosis not present

## 2019-12-12 DIAGNOSIS — Z791 Long term (current) use of non-steroidal anti-inflammatories (NSAID): Secondary | ICD-10-CM | POA: Diagnosis not present

## 2019-12-12 DIAGNOSIS — Z6841 Body Mass Index (BMI) 40.0 and over, adult: Secondary | ICD-10-CM | POA: Diagnosis not present

## 2019-12-12 DIAGNOSIS — K3189 Other diseases of stomach and duodenum: Secondary | ICD-10-CM | POA: Diagnosis not present

## 2019-12-12 DIAGNOSIS — Z87891 Personal history of nicotine dependence: Secondary | ICD-10-CM | POA: Diagnosis not present

## 2019-12-12 DIAGNOSIS — E785 Hyperlipidemia, unspecified: Secondary | ICD-10-CM | POA: Diagnosis not present

## 2019-12-12 DIAGNOSIS — M5030 Other cervical disc degeneration, unspecified cervical region: Secondary | ICD-10-CM | POA: Diagnosis not present

## 2019-12-17 DIAGNOSIS — M5431 Sciatica, right side: Secondary | ICD-10-CM | POA: Diagnosis not present

## 2019-12-17 DIAGNOSIS — M9901 Segmental and somatic dysfunction of cervical region: Secondary | ICD-10-CM | POA: Diagnosis not present

## 2019-12-27 DIAGNOSIS — M9901 Segmental and somatic dysfunction of cervical region: Secondary | ICD-10-CM | POA: Diagnosis not present

## 2019-12-27 DIAGNOSIS — M5431 Sciatica, right side: Secondary | ICD-10-CM | POA: Diagnosis not present

## 2019-12-31 DIAGNOSIS — M5431 Sciatica, right side: Secondary | ICD-10-CM | POA: Diagnosis not present

## 2019-12-31 DIAGNOSIS — M9901 Segmental and somatic dysfunction of cervical region: Secondary | ICD-10-CM | POA: Diagnosis not present

## 2020-01-07 DIAGNOSIS — M5431 Sciatica, right side: Secondary | ICD-10-CM | POA: Diagnosis not present

## 2020-01-07 DIAGNOSIS — M9901 Segmental and somatic dysfunction of cervical region: Secondary | ICD-10-CM | POA: Diagnosis not present

## 2020-01-14 DIAGNOSIS — M5431 Sciatica, right side: Secondary | ICD-10-CM | POA: Diagnosis not present

## 2020-01-14 DIAGNOSIS — M9901 Segmental and somatic dysfunction of cervical region: Secondary | ICD-10-CM | POA: Diagnosis not present

## 2020-01-15 DIAGNOSIS — I10 Essential (primary) hypertension: Secondary | ICD-10-CM | POA: Diagnosis not present

## 2020-01-15 DIAGNOSIS — Z6841 Body Mass Index (BMI) 40.0 and over, adult: Secondary | ICD-10-CM | POA: Diagnosis not present

## 2020-01-15 DIAGNOSIS — G4730 Sleep apnea, unspecified: Secondary | ICD-10-CM | POA: Diagnosis not present

## 2020-01-15 DIAGNOSIS — E8881 Metabolic syndrome: Secondary | ICD-10-CM | POA: Diagnosis not present

## 2020-01-25 DIAGNOSIS — I152 Hypertension secondary to endocrine disorders: Secondary | ICD-10-CM | POA: Diagnosis not present

## 2020-01-25 DIAGNOSIS — E1159 Type 2 diabetes mellitus with other circulatory complications: Secondary | ICD-10-CM | POA: Diagnosis not present

## 2020-01-25 DIAGNOSIS — E119 Type 2 diabetes mellitus without complications: Secondary | ICD-10-CM | POA: Diagnosis not present

## 2020-01-25 DIAGNOSIS — H6121 Impacted cerumen, right ear: Secondary | ICD-10-CM | POA: Diagnosis not present

## 2020-01-25 DIAGNOSIS — E559 Vitamin D deficiency, unspecified: Secondary | ICD-10-CM | POA: Diagnosis not present

## 2020-01-25 DIAGNOSIS — H60501 Unspecified acute noninfective otitis externa, right ear: Secondary | ICD-10-CM | POA: Diagnosis not present

## 2020-01-25 DIAGNOSIS — Z794 Long term (current) use of insulin: Secondary | ICD-10-CM | POA: Diagnosis not present

## 2020-01-25 DIAGNOSIS — H9201 Otalgia, right ear: Secondary | ICD-10-CM | POA: Diagnosis not present

## 2020-01-29 DIAGNOSIS — Z6841 Body Mass Index (BMI) 40.0 and over, adult: Secondary | ICD-10-CM | POA: Diagnosis not present

## 2020-01-29 DIAGNOSIS — Z7189 Other specified counseling: Secondary | ICD-10-CM | POA: Diagnosis not present

## 2020-01-29 DIAGNOSIS — F54 Psychological and behavioral factors associated with disorders or diseases classified elsewhere: Secondary | ICD-10-CM | POA: Diagnosis not present

## 2020-02-07 DIAGNOSIS — M5431 Sciatica, right side: Secondary | ICD-10-CM | POA: Diagnosis not present

## 2020-02-07 DIAGNOSIS — M9901 Segmental and somatic dysfunction of cervical region: Secondary | ICD-10-CM | POA: Diagnosis not present

## 2020-03-03 DIAGNOSIS — M9901 Segmental and somatic dysfunction of cervical region: Secondary | ICD-10-CM | POA: Diagnosis not present

## 2020-03-03 DIAGNOSIS — M5411 Radiculopathy, occipito-atlanto-axial region: Secondary | ICD-10-CM | POA: Diagnosis not present

## 2020-03-20 DIAGNOSIS — E8881 Metabolic syndrome: Secondary | ICD-10-CM | POA: Diagnosis not present

## 2020-03-20 DIAGNOSIS — Q433 Congenital malformations of intestinal fixation: Secondary | ICD-10-CM | POA: Diagnosis not present

## 2020-03-20 DIAGNOSIS — J449 Chronic obstructive pulmonary disease, unspecified: Secondary | ICD-10-CM | POA: Diagnosis not present

## 2020-03-20 DIAGNOSIS — I152 Hypertension secondary to endocrine disorders: Secondary | ICD-10-CM | POA: Diagnosis not present

## 2020-03-20 DIAGNOSIS — E1159 Type 2 diabetes mellitus with other circulatory complications: Secondary | ICD-10-CM | POA: Diagnosis not present

## 2020-04-02 DIAGNOSIS — M9901 Segmental and somatic dysfunction of cervical region: Secondary | ICD-10-CM | POA: Diagnosis not present

## 2020-04-02 DIAGNOSIS — M5411 Radiculopathy, occipito-atlanto-axial region: Secondary | ICD-10-CM | POA: Diagnosis not present

## 2020-04-28 DIAGNOSIS — M5411 Radiculopathy, occipito-atlanto-axial region: Secondary | ICD-10-CM | POA: Diagnosis not present

## 2020-04-28 DIAGNOSIS — M9901 Segmental and somatic dysfunction of cervical region: Secondary | ICD-10-CM | POA: Diagnosis not present

## 2020-05-08 DIAGNOSIS — M5411 Radiculopathy, occipito-atlanto-axial region: Secondary | ICD-10-CM | POA: Diagnosis not present

## 2020-05-08 DIAGNOSIS — M9901 Segmental and somatic dysfunction of cervical region: Secondary | ICD-10-CM | POA: Diagnosis not present

## 2020-05-15 DIAGNOSIS — M9901 Segmental and somatic dysfunction of cervical region: Secondary | ICD-10-CM | POA: Diagnosis not present

## 2020-05-15 DIAGNOSIS — M5411 Radiculopathy, occipito-atlanto-axial region: Secondary | ICD-10-CM | POA: Diagnosis not present

## 2020-06-30 DIAGNOSIS — Z124 Encounter for screening for malignant neoplasm of cervix: Secondary | ICD-10-CM | POA: Diagnosis not present

## 2020-06-30 DIAGNOSIS — N939 Abnormal uterine and vaginal bleeding, unspecified: Secondary | ICD-10-CM | POA: Diagnosis not present

## 2020-06-30 DIAGNOSIS — Z01812 Encounter for preprocedural laboratory examination: Secondary | ICD-10-CM | POA: Diagnosis not present

## 2020-06-30 DIAGNOSIS — R102 Pelvic and perineal pain: Secondary | ICD-10-CM | POA: Diagnosis not present

## 2020-08-11 DIAGNOSIS — J029 Acute pharyngitis, unspecified: Secondary | ICD-10-CM | POA: Diagnosis not present

## 2020-08-11 DIAGNOSIS — E1159 Type 2 diabetes mellitus with other circulatory complications: Secondary | ICD-10-CM | POA: Diagnosis not present

## 2020-08-11 DIAGNOSIS — E559 Vitamin D deficiency, unspecified: Secondary | ICD-10-CM | POA: Diagnosis not present

## 2020-08-11 DIAGNOSIS — D649 Anemia, unspecified: Secondary | ICD-10-CM | POA: Diagnosis not present

## 2020-08-11 DIAGNOSIS — Z1231 Encounter for screening mammogram for malignant neoplasm of breast: Secondary | ICD-10-CM | POA: Diagnosis not present

## 2020-08-11 DIAGNOSIS — Z794 Long term (current) use of insulin: Secondary | ICD-10-CM | POA: Diagnosis not present

## 2020-08-11 DIAGNOSIS — I152 Hypertension secondary to endocrine disorders: Secondary | ICD-10-CM | POA: Diagnosis not present

## 2021-03-12 ENCOUNTER — Emergency Department (HOSPITAL_BASED_OUTPATIENT_CLINIC_OR_DEPARTMENT_OTHER): Payer: BC Managed Care – PPO

## 2021-03-12 ENCOUNTER — Emergency Department (HOSPITAL_BASED_OUTPATIENT_CLINIC_OR_DEPARTMENT_OTHER)
Admission: EM | Admit: 2021-03-12 | Discharge: 2021-03-12 | Disposition: A | Discharge status: Home / Self Care | Payer: BC Managed Care – PPO | Attending: Emergency Medicine | Admitting: Emergency Medicine

## 2021-03-12 ENCOUNTER — Other Ambulatory Visit: Payer: Self-pay

## 2021-03-12 DIAGNOSIS — E119 Type 2 diabetes mellitus without complications: Secondary | ICD-10-CM | POA: Insufficient documentation

## 2021-03-12 DIAGNOSIS — W172XXA Fall into hole, initial encounter: Secondary | ICD-10-CM | POA: Diagnosis not present

## 2021-03-12 DIAGNOSIS — Y92007 Garden or yard of unspecified non-institutional (private) residence as the place of occurrence of the external cause: Secondary | ICD-10-CM | POA: Insufficient documentation

## 2021-03-12 DIAGNOSIS — I10 Essential (primary) hypertension: Secondary | ICD-10-CM | POA: Insufficient documentation

## 2021-03-12 DIAGNOSIS — Z87891 Personal history of nicotine dependence: Secondary | ICD-10-CM | POA: Diagnosis not present

## 2021-03-12 DIAGNOSIS — M25562 Pain in left knee: Secondary | ICD-10-CM | POA: Diagnosis not present

## 2021-03-12 DIAGNOSIS — W19XXXA Unspecified fall, initial encounter: Secondary | ICD-10-CM

## 2021-03-12 DIAGNOSIS — Y9301 Activity, walking, marching and hiking: Secondary | ICD-10-CM | POA: Diagnosis not present

## 2021-03-12 MED ORDER — HYDROCODONE-ACETAMINOPHEN 5-325 MG PO TABS: 1.0000 | ORAL_TABLET | Freq: Four times a day (QID) | ORAL | 0 refills | Status: DC | PRN

## 2021-03-12 MED ORDER — KETOROLAC TROMETHAMINE 30 MG/ML IJ SOLN
30.0000 mg | Freq: Once | INTRAMUSCULAR | Status: AC
  Administered 2021-03-12: 30 mg via INTRAMUSCULAR
  Filled 2021-03-12: qty 1

## 2021-03-12 MED ORDER — DICLOFENAC SODIUM 1 % EX GEL: 2.0000 g | Freq: Four times a day (QID) | CUTANEOUS | 0 refills | Status: DC | PRN

## 2021-03-12 MED ORDER — SENNOSIDES-DOCUSATE SODIUM 8.6-50 MG PO TABS: 1.0000 | ORAL_TABLET | Freq: Every evening | ORAL | 0 refills | Status: DC | PRN

## 2021-03-12 NOTE — Discharge Instructions (Addendum)
We believe that your symptoms are caused by musculoskeletal strain or ligament/meniscus injury.  Please read through the included information about additional care such as heating pads, over-the-counter pain medicine.  If you were provided a prescription please use it only as needed and as instructed.  Remember that early mobility and using the affected part of your body is actually better than keeping it immobile.  Follow-up with the doctor listed as recommended or return to the emergency department with new or worsening symptoms that concern you.

## 2021-03-12 NOTE — ED Triage Notes (Signed)
Pt stepped in a hole in the grass today and fell onto left leg. Now complaining of left knee pain, feels like it is catching when she moves it. Took a hydrocodone 2 hours pta without any relief.

## 2021-03-12 NOTE — ED Provider Notes (Signed)
Emergency Department Provider Note   I have reviewed the triage vital signs and the nursing notes.   HISTORY  Chief Complaint Fall   HPI [name] is a 55 y.o. female with PMH reviewed below presents to the ED with knee pain after fall today. She was walking in the lawn and stepped into a hole. She fell hurting her left knee. She is having moderate to severe pain worse with movement that feels like "locking" up at times.  Denies numbness/weakness. No pain in the hip or ankle.    Past Medical History:  Diagnosis Date   Anxiety    Arthritis    Diabetes mellitus    Endometriosis    Hypersomnia, persistent    epworth 16    Hypertension    Lyme borreliosis    Migraine    Morbid obesity ([place])    Obesity, Class III, BMI 40-49.9 (morbid obesity) ([place])    Obstructive sleep apnea    CPAP NIGHTLY; sleep study 2007.   Renal disorder    kidney stone    Patient Active Problem List   Diagnosis Date Noted   Colon, diverticulosis 07/01/2017   Anxiety 03/08/2016   Chest pain 02/20/2016   Vitamin D insufficiency 09/25/2015   Endometriosis 01/17/2014   Sleep apnea with use of continuous positive airway pressure (CPAP) 06/27/2013   Nocturia 06/27/2013   Obesity, Class III, BMI 40-49.9 (morbid obesity) ([place])    Obstructive sleep apnea 02/15/2013   Hypersomnia, persistent    HTN (hypertension) 02/03/2013   DM (diabetes mellitus), type 2 ([place]) 02/03/2013    Past Surgical History:  Procedure Laterality Date   ABLATION ON ENDOMETRIOSIS     ABLATION ON ENDOMETRIOSIS     10 years ago   CARDIAC CATHETERIZATION N/A 02/20/2016   Procedure: Left Heart Cath and Coronary Angiography;  Surgeon: [name], MD;  Location: [facility];  Service: Cardiovascular;  Laterality: N/A;   CHOLECYSTECTOMY     KNEE ARTHROCENTESIS     KNEE SURGERY     TUBAL LIGATION      Allergies Contrast media [iodinated diagnostic agents], Darvocet [propoxyphene n-acetaminophen], Lisinopril,  Metformin and related, Morphine and related, Percocet [oxycodone-acetaminophen], Robaxin [methocarbamol], Wellbutrin [bupropion hcl], and Zofran [ondansetron hcl]  Family History  Problem Relation Age of Onset   Diabetes Mother    Thyroid disease Mother    Depression Mother        with anxiety   Stroke Mother    Diabetes Father    Hypertension Father    Diabetes Sister    Hypertension Sister    Hypertension Brother     Social History Social History   Tobacco Use   Smoking status: Former    Packs/day: 0.20    Years: 0.00    Pack years: 0.00    Types: Cigarettes   Smokeless tobacco: Never  Vaping Use   Vaping Use: Never used  Substance Use Topics   Alcohol use: No   Drug use: No    Review of Systems  Constitutional: No fever/chills Eyes: No visual changes. ENT: No sore throat. Cardiovascular: Denies chest pain. Respiratory: Denies shortness of breath. Gastrointestinal: No abdominal pain.   Musculoskeletal: Positive left knee pain.  Skin: Negative for rash. Neurological: Negative for headaches.  10-point ROS otherwise negative.  ____________________________________________   PHYSICAL EXAM:  VITAL SIGNS: ED Triage Vitals  Enc Vitals Group     BP 03/12/21 1522 (!) 160/77     Pulse Rate 03/12/21 1522 (!) 107  Resp 03/12/21 1522 20     Temp 03/12/21 1522 98.6 F (37 C)     Temp Source 03/12/21 1522 Oral     SpO2 03/12/21 1522 100 %     Weight 03/12/21 1524 251 lb (113.9 kg)     Height 03/12/21 1524 5\' 1"  (1.549 m)    Constitutional: Alert and oriented. Well appearing and in no acute distress. Eyes: Conjunctivae are normal.  Head: Atraumatic. Nose: No congestion/rhinnorhea. Mouth/Throat: Mucous membranes are moist.  Neck: No stridor.  Cardiovascular: Good peripheral circulation.  Respiratory: Normal respiratory effort.  Gastrointestinal:  No distention.  Musculoskeletal: Mild joint effusion over the left knee.  No calf, anterior tibial, thigh  hematoma or tightness.  No ecchymosis.  No joint redness/warmth.  Mild joint crepitus.  No joint laxity.  Neurologic:  Normal speech and language. No gross focal neurologic deficits are appreciated.  Skin:  Skin is warm, dry and intact. No rash noted.  ____________________________________________  RADIOLOGY  Plain films reviewed.   ____________________________________________   PROCEDURES  Procedure(s) performed:   Procedures  None ____________________________________________   INITIAL IMPRESSION / ASSESSMENT AND PLAN / ED COURSE  Pertinent labs & imaging results that were available during my care of the patient were reviewed by me and considered in my medical decision making (see chart for details).   Plain film of the left knee reviewed.  No acute bony abnormality.  Will place a knee sleeve and plan for RICE at home w/ sports medicine follow up.    ____________________________________________  FINAL CLINICAL IMPRESSION(S) / ED DIAGNOSES  Final diagnoses:  Fall, initial encounter  Acute pain of left knee     MEDICATIONS GIVEN DURING THIS VISIT:  Medications  ketorolac (TORADOL) 30 MG/ML injection 30 mg (30 mg Intramuscular Given 03/12/21 1601)     NEW OUTPATIENT MEDICATIONS STARTED DURING THIS VISIT:  Discharge Medication List as of 03/12/2021  4:26 PM     START taking these medications   Details  diclofenac Sodium (VOLTAREN) 1 % GEL Apply 2 g topically 4 (four) times daily as needed., Starting Thu 03/12/2021, Normal    senna-docusate (SENOKOT-S) 8.6-50 MG tablet Take 1 tablet by mouth at bedtime as needed for mild constipation or moderate constipation., Starting Thu 03/12/2021, Normal        Note:  This document was prepared using Dragon voice recognition software and may include unintentional dictation errors.  Nanda Quinton, MD, [facility]    Tyeasha Ebbs, Wonda Olds, MD 03/15/21 [phone]

## 2021-10-20 ENCOUNTER — Emergency Department (HOSPITAL_BASED_OUTPATIENT_CLINIC_OR_DEPARTMENT_OTHER)
Admission: EM | Admit: 2021-10-20 | Discharge: 2021-10-20 | Disposition: A | Discharge status: Home / Self Care | Payer: BC Managed Care – PPO | Attending: Emergency Medicine | Admitting: Emergency Medicine

## 2021-10-20 ENCOUNTER — Emergency Department (HOSPITAL_BASED_OUTPATIENT_CLINIC_OR_DEPARTMENT_OTHER): Payer: BC Managed Care – PPO

## 2021-10-20 ENCOUNTER — Encounter (HOSPITAL_BASED_OUTPATIENT_CLINIC_OR_DEPARTMENT_OTHER): Payer: Self-pay | Admitting: Emergency Medicine

## 2021-10-20 DIAGNOSIS — Z79899 Other long term (current) drug therapy: Secondary | ICD-10-CM | POA: Insufficient documentation

## 2021-10-20 DIAGNOSIS — N201 Calculus of ureter: Secondary | ICD-10-CM | POA: Insufficient documentation

## 2021-10-20 DIAGNOSIS — M545 Low back pain, unspecified: Secondary | ICD-10-CM | POA: Diagnosis present

## 2021-10-20 LAB — CBC
HCT: 43.6 % (ref 36.0–46.0)
Hemoglobin: 15 g/dL (ref 12.0–15.0)
MCH: 31.1 pg (ref 26.0–34.0)
MCHC: 34.4 g/dL (ref 30.0–36.0)
MCV: 90.5 fL (ref 80.0–100.0)
Platelets: 310 10*3/uL (ref 150–400)
RBC: 4.82 MIL/uL (ref 3.87–5.11)
RDW: 13.7 % (ref 11.5–15.5)
WBC: 13.7 10*3/uL — ABNORMAL HIGH (ref 4.0–10.5)
nRBC: 0 % (ref 0.0–0.2)

## 2021-10-20 LAB — COMPREHENSIVE METABOLIC PANEL
ALT: 22 U/L (ref 0–44)
AST: 18 U/L (ref 15–41)
Albumin: 4 g/dL (ref 3.5–5.0)
Alkaline Phosphatase: 90 U/L (ref 38–126)
Anion gap: 10 (ref 5–15)
BUN: 25 mg/dL — ABNORMAL HIGH (ref 6–20)
CO2: 22 mmol/L (ref 22–32)
Calcium: 9 mg/dL (ref 8.9–10.3)
Chloride: 105 mmol/L (ref 98–111)
Creatinine, Ser: 1.04 mg/dL — ABNORMAL HIGH (ref 0.44–1.00)
GFR, Estimated: >60 mL/min (ref >60)
Glucose, Bld: 145 mg/dL — ABNORMAL HIGH (ref 70–99)
Potassium: 3.5 mmol/L (ref 3.5–5.1)
Sodium: 137 mmol/L (ref 135–145)
Total Bilirubin: 0.5 mg/dL (ref 0.3–1.2)
Total Protein: 7.2 g/dL (ref 6.5–8.1)

## 2021-10-20 LAB — URINALYSIS, MICROSCOPIC (REFLEX): RBC / HPF: >50 RBC/hpf (ref 0–5)

## 2021-10-20 LAB — URINALYSIS, ROUTINE W REFLEX MICROSCOPIC
Bilirubin Urine: NEGATIVE
Glucose, UA: NEGATIVE mg/dL
Ketones, ur: NEGATIVE mg/dL
Leukocytes,Ua: NEGATIVE
Nitrite: NEGATIVE
Protein, ur: NEGATIVE mg/dL
Specific Gravity, Urine: >1.03 — ABNORMAL HIGH (ref 1.005–1.030)
pH: 5.5 (ref 5.0–8.0)

## 2021-10-20 LAB — LIPASE, BLOOD: Lipase: 66 U/L — ABNORMAL HIGH (ref 11–51)

## 2021-10-20 LAB — PREGNANCY, URINE: Preg Test, Ur: NEGATIVE

## 2021-10-20 MED ORDER — PROMETHAZINE HCL 25 MG/ML IJ SOLN
INTRAMUSCULAR | Status: AC
  Filled 2021-10-20: qty 1

## 2021-10-20 MED ORDER — SODIUM CHLORIDE 0.9 % IV SOLN
12.5000 mg | Freq: Once | INTRAVENOUS | Status: AC
  Administered 2021-10-20: 21:00:00 12.5 mg via INTRAVENOUS
  Filled 2021-10-20: qty 0.5

## 2021-10-20 MED ORDER — HYDROMORPHONE HCL 1 MG/ML IJ SOLN
1.0000 mg | Freq: Once | INTRAMUSCULAR | Status: AC
  Administered 2021-10-20: 22:00:00 1 mg via INTRAVENOUS
  Filled 2021-10-20: qty 1

## 2021-10-20 MED ORDER — METOCLOPRAMIDE HCL 10 MG PO TABS: 10.0000 mg | ORAL_TABLET | Freq: Three times a day (TID) | ORAL | 0 refills | Status: DC | PRN

## 2021-10-20 MED ORDER — TAMSULOSIN HCL 0.4 MG PO CAPS: 0.4000 mg | ORAL_CAPSULE | Freq: Every day | ORAL | 0 refills | Status: DC

## 2021-10-20 MED ORDER — TRAMADOL HCL 50 MG PO TABS: 50.0000 mg | ORAL_TABLET | Freq: Four times a day (QID) | ORAL | 0 refills | Status: AC | PRN

## 2021-10-20 MED ORDER — FENTANYL CITRATE PF 50 MCG/ML IJ SOSY
50.0000 ug | PREFILLED_SYRINGE | Freq: Once | INTRAMUSCULAR | Status: AC
  Administered 2021-10-20: 21:00:00 50 ug via INTRAVENOUS
  Filled 2021-10-20: qty 1

## 2021-10-20 NOTE — ED Triage Notes (Addendum)
Pt back side and abd pain, since last night. Noticed blood in urine and Hx of Kind[place] stones. Worse tonight. Would like to get stitches removed as pt missed apt today.

## 2021-10-20 NOTE — ED Provider Notes (Signed)
[facility] EMERGENCY DEPARTMENT Provider Note   CSN: [id number] Arrival date & time: 10/20/21  1923     History  Chief Complaint  Patient presents with   Abdominal Pain   Back Pain    [name] is a 56 y.o. female.  Presenting to the emergency room with concern for abdominal pain.  Pain ongoing since yesterday, last night.  Seems to be worse throughout the day.  Up to 10 out of 10 in severity, comes and goes, right low back/right flank/right abdomen.  History of kidney stones, feels similar.  Noted small amount of blood in her urine.  No burning with urination chills or fevers.  Some nausea but no vomiting.    HPI     Home Medications Prior to Admission medications   Medication Sig Start Date End Date Taking? Authorizing Provider  albuterol (PROVENTIL HFA;VENTOLIN HFA) 108 (90 Base) MCG/ACT inhaler Inhale 2 puffs into the lungs every 6 (six) hours as needed for wheezing or shortness of breath. 02/28/18   Florencia Reasons, MD  atorvastatin (LIPITOR) 40 MG tablet Take 1 tablet (40 mg total) by mouth daily. 04/24/18   Gale Journey, Damaris Hippo, PA-C  celecoxib (CELEBREX) 200 MG capsule Take 1 capsule (200 mg total) by mouth daily. 04/20/18   Weber, Damaris Hippo, PA-C  Continuous Blood Gluc Sensor (FREESTYLE LIBRE 14 DAY SENSOR) MISC 1 application by Does not apply route every 14 (fourteen) days. 04/20/18   Gale Journey, Damaris Hippo, PA-C  cyclobenzaprine (FLEXERIL) 10 MG tablet Take 1 tablet (10 mg total) by mouth 3 (three) times daily as needed for muscle spasms. 07/12/18   Maczis, Barth Kirks, PA-C  diazepam (VALIUM) 2 MG tablet Take 0.5-1 tablets (1-2 mg total) every 12 (twelve) hours as needed by mouth for anxiety or muscle spasms. 08/16/17   McVey, Gelene Mink, PA-C  diclofenac Sodium (VOLTAREN) 1 % GEL Apply 2 g topically 4 (four) times daily as needed. 03/12/21   Long, Wonda Olds, MD  doxycycline (VIBRA-TABS) 100 MG tablet Take 1 tablet (100 mg total) by mouth 2 (two) times daily. 04/24/18   Gale Journey,  Damaris Hippo, PA-C  FLUoxetine (PROZAC) 20 MG capsule Take 1 capsule (20 mg total) by mouth 2 (two) times daily. 04/20/18   Weber, Damaris Hippo, PA-C  furosemide (LASIX) 20 MG tablet Take 2 tablets (40 mg total) by mouth daily. 05/23/18 05/23/19  Gale Journey, Damaris Hippo, PA-C  gemfibrozil (LOPID) 600 MG tablet Take 1 tablet (600 mg total) by mouth 2 (two) times daily before a meal. 04/18/18   Weber, Damaris Hippo, PA-C  HYDROcodone-acetaminophen (NORCO/VICODIN) 5-325 MG tablet Take 1 tablet by mouth every 6 (six) hours as needed for severe pain. 03/12/21   Long, Wonda Olds, MD  Insulin Glargine (TOUJEO SOLOSTAR) 300 UNIT/ML SOPN Inject 50 Units into the skin 2 (two) times daily. 05/23/18   Weber, Sarah L, PA-C  liraglutide (VICTOZA) 18 MG/3ML SOPN Inject 0.3 mLs (1.8 mg total) into the skin daily. INJECT 0.3MLS SUBCUTANEOUSLY DAILY 05/23/18   Gale Journey, Damaris Hippo, PA-C  metoprolol tartrate (LOPRESSOR) 50 MG tablet Take 1 tablet (50 mg total) by mouth 2 (two) times daily. 04/20/18   Weber, Sarah L, PA-C  NOVOTWIST 32G X 5 MM MISC USE 1 APPLICATION BY DOES NOT APPLY ROUTE DAILY. 04/20/18   Weber, Damaris Hippo, PA-C  potassium chloride SA (K-DUR,KLOR-CON) 20 MEQ tablet Take 2 tablets (40 mEq total) by mouth daily. 04/20/18   Weber, Damaris Hippo, PA-C  promethazine (PHENERGAN) 25 MG tablet Take 1  tablet (25 mg total) by mouth every 8 (eight) hours as needed for nausea or vomiting. 11/11/17   Quintella Reichert, MD  senna-docusate (SENOKOT-S) 8.6-50 MG tablet Take 1 tablet by mouth at bedtime as needed for mild constipation or moderate constipation. 03/12/21   Long, Wonda Olds, MD  Ranitidine HCl (ZANTAC PO) Take 1 tablet by mouth 2 (two) times daily. Patient uses this medication for heartburn.  06/03/13  [provider]      Allergies    Contrast media [iodinated contrast media], Darvocet [propoxyphene n-acetaminophen], Lisinopril, Metformin and related, Morphine and related, Percocet [oxycodone-acetaminophen], Robaxin [methocarbamol], Wellbutrin  [bupropion hcl], and Zofran [ondansetron hcl]    Review of Systems   Review of Systems  Constitutional:  Negative for chills and fever.  HENT:  Negative for ear pain and sore throat.   Eyes:  Negative for pain and visual disturbance.  Respiratory:  Negative for cough and shortness of breath.   Cardiovascular:  Negative for chest pain and palpitations.  Gastrointestinal:  Positive for abdominal pain. Negative for vomiting.  Genitourinary:  Positive for hematuria. Negative for dysuria.  Musculoskeletal:  Positive for back pain. Negative for arthralgias.  Skin:  Negative for color change and rash.  Neurological:  Negative for seizures and syncope.  All other systems reviewed and are negative.  Physical Exam Updated Vital Signs BP (!) 160/83 (BP Location: Right Arm)    Pulse 63    Temp 98.8 F (37.1 C) (Oral)    Resp 16    Ht 5\' 1"  (1.549 m)    Wt 103.4 kg    LMP  (LMP Unknown)    SpO2 98%    BMI 43.08 kg/m  Physical Exam Vitals and nursing note reviewed.  Constitutional:      General: She is not in acute distress.    Appearance: She is well-developed.  HENT:     Head: Normocephalic and atraumatic.  Eyes:     Conjunctiva/sclera: Conjunctivae normal.  Cardiovascular:     Rate and Rhythm: Normal rate and regular rhythm.     Heart sounds: No murmur heard. Pulmonary:     Effort: Pulmonary effort is normal. No respiratory distress.     Breath sounds: Normal breath sounds.  Abdominal:     Palpations: Abdomen is soft.     Tenderness: There is abdominal tenderness.     Comments: Some tenderness to right flank, no rebound or guarding  Musculoskeletal:        General: No swelling.     Cervical back: Neck supple.  Skin:    General: Skin is warm and dry.     Capillary Refill: Capillary refill takes less than 2 seconds.  Neurological:     Mental Status: She is alert.  Psychiatric:        Mood and Affect: Mood normal.    ED Results / Procedures / Treatments   Labs (all labs  ordered are listed, but only abnormal results are displayed) Labs Reviewed  LIPASE, BLOOD - Abnormal; Notable for the following components:      Result Value   Lipase 66 (*)    All other components within normal limits  COMPREHENSIVE METABOLIC PANEL - Abnormal; Notable for the following components:   Glucose, Bld 145 (*)    BUN 25 (*)    Creatinine, Ser 1.04 (*)    All other components within normal limits  CBC - Abnormal; Notable for the following components:   WBC 13.7 (*)    All other components  within normal limits  URINALYSIS, ROUTINE W REFLEX MICROSCOPIC - Abnormal; Notable for the following components:   Specific Gravity, Urine >1.030 (*)    Hgb urine dipstick LARGE (*)    All other components within normal limits  URINALYSIS, MICROSCOPIC (REFLEX) - Abnormal; Notable for the following components:   Bacteria, UA FEW (*)    All other components within normal limits  PREGNANCY, URINE    EKG None  Radiology No results found.  Procedures Procedures    Medications Ordered in ED Medications  promethazine (PHENERGAN) 12.5 mg in sodium chloride 0.9 % 50 mL IVPB (12.5 mg Intravenous New Bag/Given 10/20/21 2105)  promethazine (PHENERGAN) 25 MG/ML injection (  Not Given 10/20/21 2108)  fentaNYL (SUBLIMAZE) injection 50 mcg (50 mcg Intravenous Given 10/20/21 2101)    ED Course/ Medical Decision Making/ A&P                           Medical Decision Making Amount and/or Complexity of Data Reviewed Labs: ordered. Radiology: ordered.  Risk Prescription drug management.   56 year old lady presenting to ER with concern for right-sided abdominal pain, right flank pain, hematuria.  On exam initially uncomfortable secondary to pain.  Vital stable except for hypertension initially.  Given prior history of kidney stones and history, strong suspicion for ureteral stone, also consider pyelonephritis versus appendicitis.  UA with hematuria but no infection.  CT with 3 mm right UVJ  stone.  Independently reviewed CT and agree.  Suspect this is culprit for her pain today.  Patient was given pain and nausea medicine.  Her symptoms greatly improved, BP improved as well with pain control.  Given her pain is well controlled she has no infection, stone is small, believe she is appropriate for outpatient management and does not require admission at this time.  Recommend she follow-up with urology.  Provided patient course of Flomax, pain medicine, nausea medicine.  Incidental finding on CT scan of left ovarian lesion.  Present on prior CT per radiology.  Discussed with patient and recommended follow-up with primary care or gynecology and discussed need for outpatient nonemergent ultrasound or MRI.  Patient and husband at bedside demonstrate understanding.  Husband updated throughout stay.  Additional history obtained from chart review, review of past imaging.    Patient also endorsed having procedure by dermatology for skin lesion on her left anterior chest wall.  She requested sutures to be removed.  I examined the area and does not appear to be infected and appears to be healing well.  Given this was an elective procedure, recommend that she go back to her proceduralist to have this evaluated and sutures removed if they feel appropriate.          Final Clinical Impression(s) / ED Diagnoses Final diagnoses:  Right ureteral stone    Rx / DC Orders ED Discharge Orders     None         Lucrezia Starch, MD 10/21/21 [phone]

## 2021-10-20 NOTE — Discharge Instructions (Addendum)
Please follow-up with your urologist regarding your kidney stone.  Drink plenty of fluids, take Tylenol or the prescribed tramadol for pain control.  Come back to ER if you develop fever, uncontrolled pain, vomiting or other new concerning symptom.  The radiologist noted incidental finding on your left ovary and recommended obtaining ultrasound or MRI to further evaluate.  Your gynecologist or your primary care doctor can arrange this.

## 2021-10-20 NOTE — ED Notes (Signed)
Patient taken to CT at this time.

## 2021-10-31 ENCOUNTER — Other Ambulatory Visit: Payer: Self-pay

## 2021-10-31 ENCOUNTER — Emergency Department (HOSPITAL_BASED_OUTPATIENT_CLINIC_OR_DEPARTMENT_OTHER): Payer: BC Managed Care – PPO

## 2021-10-31 ENCOUNTER — Emergency Department (HOSPITAL_COMMUNITY): Payer: BC Managed Care – PPO

## 2021-10-31 ENCOUNTER — Encounter (HOSPITAL_BASED_OUTPATIENT_CLINIC_OR_DEPARTMENT_OTHER): Payer: Self-pay | Admitting: *Deleted

## 2021-10-31 ENCOUNTER — Observation Stay (HOSPITAL_BASED_OUTPATIENT_CLINIC_OR_DEPARTMENT_OTHER)
Admission: EM | Admit: 2021-10-31 | Discharge: 2021-11-02 | Disposition: A | Discharge status: Home / Self Care | Payer: BC Managed Care – PPO | Attending: Internal Medicine | Admitting: Internal Medicine

## 2021-10-31 DIAGNOSIS — R112 Nausea with vomiting, unspecified: Secondary | ICD-10-CM | POA: Insufficient documentation

## 2021-10-31 DIAGNOSIS — Z20822 Contact with and (suspected) exposure to covid-19: Secondary | ICD-10-CM | POA: Diagnosis not present

## 2021-10-31 DIAGNOSIS — F419 Anxiety disorder, unspecified: Secondary | ICD-10-CM | POA: Diagnosis present

## 2021-10-31 DIAGNOSIS — Z794 Long term (current) use of insulin: Secondary | ICD-10-CM | POA: Insufficient documentation

## 2021-10-31 DIAGNOSIS — D72829 Elevated white blood cell count, unspecified: Secondary | ICD-10-CM | POA: Insufficient documentation

## 2021-10-31 DIAGNOSIS — E119 Type 2 diabetes mellitus without complications: Secondary | ICD-10-CM | POA: Diagnosis not present

## 2021-10-31 DIAGNOSIS — Z87891 Personal history of nicotine dependence: Secondary | ICD-10-CM | POA: Insufficient documentation

## 2021-10-31 DIAGNOSIS — R109 Unspecified abdominal pain: Secondary | ICD-10-CM | POA: Diagnosis present

## 2021-10-31 DIAGNOSIS — N200 Calculus of kidney: Secondary | ICD-10-CM

## 2021-10-31 DIAGNOSIS — G4730 Sleep apnea, unspecified: Secondary | ICD-10-CM | POA: Diagnosis present

## 2021-10-31 DIAGNOSIS — I10 Essential (primary) hypertension: Secondary | ICD-10-CM | POA: Insufficient documentation

## 2021-10-31 DIAGNOSIS — R102 Pelvic and perineal pain: Secondary | ICD-10-CM

## 2021-10-31 DIAGNOSIS — R1031 Right lower quadrant pain: Secondary | ICD-10-CM | POA: Diagnosis not present

## 2021-10-31 DIAGNOSIS — Z79899 Other long term (current) drug therapy: Secondary | ICD-10-CM | POA: Diagnosis not present

## 2021-10-31 DIAGNOSIS — N8500 Endometrial hyperplasia, unspecified: Secondary | ICD-10-CM

## 2021-10-31 LAB — CBC WITH DIFFERENTIAL/PLATELET
Abs Immature Granulocytes: 0.03 10*3/uL (ref 0.00–0.07)
Basophils Absolute: 0.1 10*3/uL (ref 0.0–0.1)
Basophils Relative: 1 %
Eosinophils Absolute: 0 10*3/uL (ref 0.0–0.5)
Eosinophils Relative: 0 %
HCT: 41.1 % (ref 36.0–46.0)
Hemoglobin: 14.2 g/dL (ref 12.0–15.0)
Immature Granulocytes: 0 %
Lymphocytes Relative: 31 %
Lymphs Abs: 3.4 10*3/uL (ref 0.7–4.0)
MCH: 31.3 pg (ref 26.0–34.0)
MCHC: 34.5 g/dL (ref 30.0–36.0)
MCV: 90.7 fL (ref 80.0–100.0)
Monocytes Absolute: 0.8 10*3/uL (ref 0.1–1.0)
Monocytes Relative: 7 %
Neutro Abs: 6.6 10*3/uL (ref 1.7–7.7)
Neutrophils Relative %: 61 %
Platelets: 319 10*3/uL (ref 150–400)
RBC: 4.53 MIL/uL (ref 3.87–5.11)
RDW: 13.8 % (ref 11.5–15.5)
WBC: 10.9 10*3/uL — ABNORMAL HIGH (ref 4.0–10.5)
nRBC: 0 % (ref 0.0–0.2)

## 2021-10-31 LAB — COMPREHENSIVE METABOLIC PANEL
ALT: 27 U/L (ref 0–44)
AST: 17 U/L (ref 15–41)
Albumin: 3.9 g/dL (ref 3.5–5.0)
Alkaline Phosphatase: 76 U/L (ref 38–126)
Anion gap: 8 (ref 5–15)
BUN: 22 mg/dL — ABNORMAL HIGH (ref 6–20)
CO2: 26 mmol/L (ref 22–32)
Calcium: 9.2 mg/dL (ref 8.9–10.3)
Chloride: 105 mmol/L (ref 98–111)
Creatinine, Ser: 0.78 mg/dL (ref 0.44–1.00)
GFR, Estimated: >60 mL/min (ref >60)
Glucose, Bld: 90 mg/dL (ref 70–99)
Potassium: 3.7 mmol/L (ref 3.5–5.1)
Sodium: 139 mmol/L (ref 135–145)
Total Bilirubin: 0.4 mg/dL (ref 0.3–1.2)
Total Protein: 6.8 g/dL (ref 6.5–8.1)

## 2021-10-31 LAB — LIPASE, BLOOD: Lipase: 56 U/L — ABNORMAL HIGH (ref 11–51)

## 2021-10-31 MED ORDER — HYDROMORPHONE HCL 1 MG/ML IJ SOLN
1.0000 mg | Freq: Once | INTRAMUSCULAR | Status: AC
  Administered 2021-10-31: 1 mg via INTRAVENOUS
  Filled 2021-10-31: qty 1

## 2021-10-31 MED ORDER — PROMETHAZINE HCL 25 MG/ML IJ SOLN
INTRAMUSCULAR | Status: AC
  Filled 2021-10-31: qty 1

## 2021-10-31 MED ORDER — SODIUM CHLORIDE 0.9 % IV SOLN
12.5000 mg | Freq: Four times a day (QID) | INTRAVENOUS | Status: DC | PRN
  Administered 2021-10-31 – 2021-11-01 (×2): 12.5 mg via INTRAVENOUS
  Filled 2021-10-31: qty 0.5

## 2021-10-31 MED ORDER — METOCLOPRAMIDE HCL 5 MG/ML IJ SOLN
10.0000 mg | Freq: Once | INTRAMUSCULAR | Status: AC
  Administered 2021-10-31: 10 mg via INTRAVENOUS
  Filled 2021-10-31: qty 2

## 2021-10-31 NOTE — ED Provider Notes (Signed)
Emergency Department Provider Note   I have reviewed the triage vital signs and the nursing notes.   HISTORY  Chief Complaint Flank Pain   HPI Saryiah Bencosme is a 56 y.o. female with past history reviewed below including ureteral stent on 10 days prior presents to the emergency department with return of right flank and abdominal pain.  She has associated nausea vomiting.  She states it feels exactly the same as her recent kidney stone.  She had prior kidney stones in the past but has not required urology intervention.  She is not having fevers or chills.  No dysuria, hesitancy, urgency.  No vaginal bleeding or discharge. Pain is severe.   Past Medical History:  Diagnosis Date   Anxiety    Arthritis    Diabetes mellitus    Endometriosis    Hypersomnia, persistent    epworth 16    Hypertension    Lyme borreliosis    Migraine    Morbid obesity ([place])    Obesity, Class III, BMI 40-49.9 (morbid obesity) ([place])    Obstructive sleep apnea    CPAP NIGHTLY; sleep study 2007.   Renal disorder    kidney stone    Review of Systems  Constitutional: No fever/chills Eyes: No visual changes. ENT: No sore throat. Cardiovascular: Denies chest pain. Respiratory: Denies shortness of breath. Gastrointestinal: Positive RLQ abdominal/flank pain. Positive nausea and vomiting.  No diarrhea.  No constipation. Genitourinary: Negative for dysuria. Musculoskeletal: Negative for back pain. Skin: Negative for rash. Neurological: Negative for headaches, focal weakness or numbness.   ____________________________________________   PHYSICAL EXAM:  VITAL SIGNS: ED Triage Vitals  Enc Vitals Group     BP 10/31/21 2031 (!) 163/84     Pulse Rate 10/31/21 2031 (!) 53     Resp 10/31/21 2031 18     Temp 10/31/21 2031 98.2 F (36.8 C)     Temp Source 10/31/21 2031 Oral     SpO2 10/31/21 2031 98 %     Weight 10/31/21 2024 228 lb (103.4 kg)     Height 10/31/21 2024 5\' 1"  (1.549 m)    Constitutional: Alert and oriented. Patient grimacing, shifting in bed, and appearing uncomfortable.  Eyes: Conjunctivae are normal.  Head: Atraumatic. Nose: No congestion/rhinnorhea. Mouth/Throat: Mucous membranes are moist.  Neck: No stridor.  Cardiovascular: Normal rate, regular rhythm. Good peripheral circulation. Grossly normal heart sounds.   Respiratory: Normal respiratory effort.  No retractions. Lungs CTAB. Gastrointestinal: Soft and nontender. No distention.  Musculoskeletal: No gross deformities of extremities. Neurologic:  Normal speech and language.  Skin:  Skin is warm, dry and intact. No rash noted.   ____________________________________________   LABS (all labs ordered are listed, but only abnormal results are displayed)  Labs Reviewed  COMPREHENSIVE METABOLIC PANEL - Abnormal; Notable for the following components:      Result Value   BUN 22 (*)    All other components within normal limits  LIPASE, BLOOD - Abnormal; Notable for the following components:   Lipase 56 (*)    All other components within normal limits  CBC WITH DIFFERENTIAL/PLATELET - Abnormal; Notable for the following components:   WBC 10.9 (*)    All other components within normal limits  URINE CULTURE  URINALYSIS, ROUTINE W REFLEX MICROSCOPIC    ____________________________________________  RADIOLOGY  CT Renal Stone Study  Result Date: 10/31/2021 CLINICAL DATA:  Right flank pain EXAM: CT ABDOMEN AND PELVIS WITHOUT CONTRAST TECHNIQUE: Multidetector CT imaging of the abdomen and pelvis was  performed following the standard protocol without IV contrast. RADIATION DOSE REDUCTION: This exam was performed according to the departmental dose-optimization program which includes automated exposure control, adjustment of the mA and/or kV according to patient size and/or use of iterative reconstruction technique. COMPARISON:  10/20/2021 FINDINGS: Lower chest: No acute abnormality Hepatobiliary: No focal  liver abnormality is seen. Status post cholecystectomy. No biliary dilatation. Pancreas: No focal abnormality or ductal dilatation. Spleen: No focal abnormality.  Normal size. Adrenals/Urinary Tract: 5 mm nonobstructing stone in the lower pole of the right kidney. No ureteral stones or hydronephrosis. Previously seen right UVJ stone no longer visualized. Adrenal glands and urinary bladder unremarkable. Stomach/Bowel: Diffuse colonic diverticulosis. No active diverticulitis. Stomach and small bowel decompressed, grossly unremarkable. Appendix not visualized. No pericecal inflammation. Vascular/Lymphatic: Aortic atherosclerosis. No evidence of aneurysm or adenopathy. Reproductive: 2.7 cm left ovarian cyst, decreased in size since prior study. Uterus and right adnexa unremarkable. Other: No free fluid or free air. Musculoskeletal: No acute bony abnormality. IMPRESSION: No ureteral stones or hydronephrosis. Right lower pole nephrolithiasis. Diffuse colonic diverticulosis.  No active diverticulitis. No acute findings. Electronically Signed   By: Rolm Baptise M.D.   On: 10/31/2021 20:51    ____________________________________________   PROCEDURES  Procedure(s) performed:   Procedures  None  ____________________________________________   INITIAL IMPRESSION / ASSESSMENT AND PLAN / ED COURSE  Pertinent labs & imaging results that were available during my care of the patient were reviewed by me and considered in my medical decision making (see chart for details).   This patient is Presenting for Evaluation of abdominal pain, which does require a range of treatment options, and is a complaint that involves a high risk of morbidity and mortality.  The Differential Diagnoses includes but is not exclusive to ectopic pregnancy, ovarian cyst, ovarian torsion, acute appendicitis, urinary tract infection, endometriosis, bowel obstruction, hernia, colitis, renal colic, gastroenteritis, volvulus etc.   Critical  Interventions- IV pain medication and nausea meds.    Medications  promethazine (PHENERGAN) 12.5 mg in sodium chloride 0.9 % 50 mL IVPB (12.5 mg Intravenous New Bag/Given 10/31/21 2109)  promethazine (PHENERGAN) 25 MG/ML injection (has no administration in time range)  HYDROmorphone (DILAUDID) injection 1 mg (1 mg Intravenous Given 10/31/21 2106)    Reassessment after intervention:  Pain improved but waxing and waning.   I did obtain Additional Historical Information from patient's husband.  I decided to review pertinent External Data, and in summary patient seen on 1/24 with right flank pain but 3 mm stone noted with hydro at that time.   Clinical Laboratory Tests Ordered, included CMP is showing no acute kidney injury.  No electrolyte disturbance.  Very mild elevated lipase.  WBC count of 10.9.  No anemia.  Radiologic Tests Ordered, included CT renal. I independently interpreted the images and agree with radiology interpretation.  No ureteral stone or hydronephrosis on the right flank to explain symptoms.  Left ovarian cyst noted although decreased in size from prior CT imaging. No findings on the right.   Cardiac Monitor Tracing which shows NSR.    Social Determinants of Health Risk patient is a former smoker.   Consult complete with EDP Dr. Gilford Raid who accepts patient in ED-ED transfer to [facility] for pelvic US.   Medical Decision Making: Summary:  Patient presents to the emergency department for evaluation of acute onset right flank pain and nausea/vomiting.  Notes it feels similar to prior ureteral stones.  Work-up as above showing no acute findings on CT renal scan.  There is no hydronephrosis or visualized stone.  No acute adnexal findings on the right.  Cyst on the left ovary is again visualized but reduced in size compared to prior.  Patient continues to have intermittent moderate to severe pain despite IV pain medications and nausea medications.  Plan for pelvic ultrasound but I do not  have the capability at this facility at this time.  I have arranged for transport to the [facility] emergency department where patient can have pelvic ultrasound.   Reevaluation with update and discussion with patient and husband who are in agreement with transfer and [place]. Considered private vehicle transport but patient has had multiple rounds of Dilaudid along with Phenergan. Will call [facility].   Disposition: transfer to [facility] ED.   ____________________________________________  FINAL CLINICAL IMPRESSION(S) / ED DIAGNOSES  Final diagnoses:  Right flank pain    Note:  This document was prepared using Dragon voice recognition software and may include unintentional dictation errors.  Nanda Quinton, MD, [facility] Emergency Medicine    Ayinde Swim, Wonda Olds, MD 10/31/21 2152

## 2021-10-31 NOTE — ED Notes (Signed)
To us

## 2021-10-31 NOTE — ED Triage Notes (Signed)
Right side flank pain with nausea x 2 hours. Voices concern for kidney stone

## 2021-10-31 NOTE — ED Notes (Signed)
Pt transferred from [facility] [place] for an ultrasound their machine is broken.  Pt alert wants more pain med

## 2021-10-31 NOTE — ED Notes (Signed)
Pt to CT scan via stretcher by rad tech.

## 2021-11-01 ENCOUNTER — Emergency Department (HOSPITAL_COMMUNITY): Payer: BC Managed Care – PPO

## 2021-11-01 ENCOUNTER — Encounter (HOSPITAL_COMMUNITY): Payer: Self-pay | Admitting: Emergency Medicine

## 2021-11-01 DIAGNOSIS — E1159 Type 2 diabetes mellitus with other circulatory complications: Secondary | ICD-10-CM | POA: Diagnosis not present

## 2021-11-01 DIAGNOSIS — R1031 Right lower quadrant pain: Secondary | ICD-10-CM | POA: Diagnosis not present

## 2021-11-01 DIAGNOSIS — N200 Calculus of kidney: Secondary | ICD-10-CM

## 2021-11-01 DIAGNOSIS — Z794 Long term (current) use of insulin: Secondary | ICD-10-CM

## 2021-11-01 DIAGNOSIS — I10 Essential (primary) hypertension: Secondary | ICD-10-CM | POA: Diagnosis not present

## 2021-11-01 DIAGNOSIS — D72829 Elevated white blood cell count, unspecified: Secondary | ICD-10-CM

## 2021-11-01 DIAGNOSIS — R109 Unspecified abdominal pain: Secondary | ICD-10-CM | POA: Diagnosis present

## 2021-11-01 DIAGNOSIS — F419 Anxiety disorder, unspecified: Secondary | ICD-10-CM

## 2021-11-01 DIAGNOSIS — G4730 Sleep apnea, unspecified: Secondary | ICD-10-CM

## 2021-11-01 LAB — URINALYSIS, ROUTINE W REFLEX MICROSCOPIC
Bilirubin Urine: NEGATIVE
Glucose, UA: NEGATIVE mg/dL
Ketones, ur: NEGATIVE mg/dL
Leukocytes,Ua: NEGATIVE
Nitrite: NEGATIVE
Protein, ur: NEGATIVE mg/dL
Specific Gravity, Urine: >1.03 — ABNORMAL HIGH (ref 1.005–1.030)
pH: 5.5 (ref 5.0–8.0)

## 2021-11-01 LAB — RESP PANEL BY RT-PCR (FLU A&B, COVID) ARPGX2
Influenza A by PCR: NEGATIVE
Influenza B by PCR: NEGATIVE
SARS Coronavirus 2 by RT PCR: NEGATIVE

## 2021-11-01 LAB — CBC
HCT: 42.3 % (ref 36.0–46.0)
Hemoglobin: 14.6 g/dL (ref 12.0–15.0)
MCH: 31.7 pg (ref 26.0–34.0)
MCHC: 34.5 g/dL (ref 30.0–36.0)
MCV: 92 fL (ref 80.0–100.0)
Platelets: 320 10*3/uL (ref 150–400)
RBC: 4.6 MIL/uL (ref 3.87–5.11)
RDW: 13.7 % (ref 11.5–15.5)
WBC: 9.2 10*3/uL (ref 4.0–10.5)
nRBC: 0 % (ref 0.0–0.2)

## 2021-11-01 LAB — TROPONIN I (HIGH SENSITIVITY): Troponin I (High Sensitivity): 6 ng/L (ref <18)

## 2021-11-01 LAB — URINALYSIS, MICROSCOPIC (REFLEX)

## 2021-11-01 LAB — RAPID URINE DRUG SCREEN, HOSP PERFORMED
Amphetamines: NOT DETECTED
Barbiturates: NOT DETECTED
Benzodiazepines: POSITIVE — AB
Cocaine: NOT DETECTED
Opiates: POSITIVE — AB
Tetrahydrocannabinol: POSITIVE — AB

## 2021-11-01 LAB — HEMOGLOBIN A1C
Hgb A1c MFr Bld: 6.4 % — ABNORMAL HIGH (ref 4.8–5.6)
Mean Plasma Glucose: 136.98 mg/dL

## 2021-11-01 LAB — GLUCOSE, CAPILLARY
Glucose-Capillary: 77 mg/dL (ref 70–99)
Glucose-Capillary: 118 mg/dL — ABNORMAL HIGH (ref 70–99)

## 2021-11-01 LAB — LACTIC ACID, PLASMA
Lactic Acid, Venous: 1.5 mmol/L (ref 0.5–1.9)
Lactic Acid, Venous: 1.3 mmol/L (ref 0.5–1.9)

## 2021-11-01 MED ORDER — IOHEXOL 350 MG/ML SOLN
100.0000 mL | Freq: Once | INTRAVENOUS | Status: AC | PRN
  Administered 2021-11-01: 100 mL via INTRAVENOUS

## 2021-11-01 MED ORDER — ACETAMINOPHEN 650 MG RE SUPP: 650.0000 mg | Freq: Four times a day (QID) | RECTAL | Status: DC | PRN

## 2021-11-01 MED ORDER — HYDROCODONE-ACETAMINOPHEN 5-325 MG PO TABS
1.0000 | ORAL_TABLET | Freq: Four times a day (QID) | ORAL | Status: DC | PRN
  Administered 2021-11-01 – 2021-11-02 (×4): 2 via ORAL
  Filled 2021-11-01 (×4): qty 2

## 2021-11-01 MED ORDER — SODIUM CHLORIDE 0.9 % IV SOLN: INTRAVENOUS | Status: DC

## 2021-11-01 MED ORDER — KETOROLAC TROMETHAMINE 15 MG/ML IJ SOLN
15.0000 mg | Freq: Once | INTRAMUSCULAR | Status: AC
  Administered 2021-11-01: 15 mg via INTRAVENOUS
  Filled 2021-11-01: qty 1

## 2021-11-01 MED ORDER — METOPROLOL TARTRATE 50 MG PO TABS
50.0000 mg | ORAL_TABLET | Freq: Two times a day (BID) | ORAL | Status: DC
  Administered 2021-11-01 – 2021-11-02 (×2): 50 mg via ORAL
  Filled 2021-11-01 (×2): qty 1

## 2021-11-01 MED ORDER — SODIUM CHLORIDE 0.9% FLUSH: 3.0000 mL | Freq: Two times a day (BID) | INTRAVENOUS | Status: DC

## 2021-11-01 MED ORDER — DIPHENHYDRAMINE HCL 50 MG/ML IJ SOLN
50.0000 mg | Freq: Once | INTRAMUSCULAR | Status: AC
  Administered 2021-11-01: 50 mg via INTRAVENOUS
  Filled 2021-11-01: qty 1

## 2021-11-01 MED ORDER — ACETAMINOPHEN 325 MG PO TABS: 650.0000 mg | ORAL_TABLET | Freq: Four times a day (QID) | ORAL | Status: DC | PRN

## 2021-11-01 MED ORDER — SODIUM CHLORIDE 0.9 % IV SOLN
12.5000 mg | Freq: Four times a day (QID) | INTRAVENOUS | Status: DC | PRN
  Administered 2021-11-01 – 2021-11-02 (×4): 12.5 mg via INTRAVENOUS
  Filled 2021-11-01 (×2): qty 12.5
  Filled 2021-11-01: qty 0.5
  Filled 2021-11-01: qty 12.5
  Filled 2021-11-01: qty 0.5

## 2021-11-01 MED ORDER — TAMSULOSIN HCL 0.4 MG PO CAPS
0.4000 mg | ORAL_CAPSULE | Freq: Every day | ORAL | Status: DC
  Administered 2021-11-01 – 2021-11-02 (×2): 0.4 mg via ORAL
  Filled 2021-11-01 (×2): qty 1

## 2021-11-01 MED ORDER — PANTOPRAZOLE SODIUM 40 MG PO TBEC
40.0000 mg | DELAYED_RELEASE_TABLET | Freq: Two times a day (BID) | ORAL | Status: DC
  Administered 2021-11-01 – 2021-11-02 (×2): 40 mg via ORAL
  Filled 2021-11-01 (×2): qty 1

## 2021-11-01 MED ORDER — DIPHENHYDRAMINE HCL 25 MG PO CAPS: 50.0000 mg | ORAL_CAPSULE | Freq: Once | ORAL | Status: AC

## 2021-11-01 MED ORDER — MORPHINE SULFATE (PF) 4 MG/ML IV SOLN: 8.0000 mg | Freq: Once | INTRAVENOUS | Status: DC

## 2021-11-01 MED ORDER — PROGESTERONE 200 MG PO CAPS
200.0000 mg | ORAL_CAPSULE | Freq: Every day | ORAL | Status: DC
  Administered 2021-11-01: 200 mg via ORAL
  Filled 2021-11-01 (×2): qty 1

## 2021-11-01 MED ORDER — HYDROCODONE-ACETAMINOPHEN 5-325 MG PO TABS
2.0000 | ORAL_TABLET | Freq: Once | ORAL | Status: AC
  Administered 2021-11-01: 2 via ORAL
  Filled 2021-11-01: qty 2

## 2021-11-01 MED ORDER — FLUOXETINE HCL 20 MG PO CAPS
20.0000 mg | ORAL_CAPSULE | Freq: Two times a day (BID) | ORAL | Status: DC
  Administered 2021-11-01 – 2021-11-02 (×2): 20 mg via ORAL
  Filled 2021-11-01 (×2): qty 1

## 2021-11-01 MED ORDER — GEMFIBROZIL 600 MG PO TABS
600.0000 mg | ORAL_TABLET | Freq: Two times a day (BID) | ORAL | Status: DC
  Administered 2021-11-02: 600 mg via ORAL
  Filled 2021-11-01 (×3): qty 1

## 2021-11-01 MED ORDER — DIAZEPAM 2 MG PO TABS: 1.0000 mg | ORAL_TABLET | Freq: Two times a day (BID) | ORAL | Status: DC | PRN

## 2021-11-01 MED ORDER — ONDANSETRON HCL 4 MG PO TABS: 4.0000 mg | ORAL_TABLET | Freq: Four times a day (QID) | ORAL | Status: DC | PRN

## 2021-11-01 MED ORDER — EPINEPHRINE 0.3 MG/0.3ML IJ SOAJ
0.3000 mg | Freq: Once | INTRAMUSCULAR | Status: AC
Start: 2021-11-01 — End: 2021-11-01
  Administered 2021-11-01: 0.3 mg via INTRAMUSCULAR

## 2021-11-01 MED ORDER — INSULIN ASPART 100 UNIT/ML IJ SOLN: 0.0000 [IU] | Freq: Three times a day (TID) | INTRAMUSCULAR | Status: DC

## 2021-11-01 MED ORDER — EPINEPHRINE 0.3 MG/0.3ML IJ SOAJ
INTRAMUSCULAR | Status: AC
  Filled 2021-11-01: qty 0.3

## 2021-11-01 MED ORDER — HYDROCORTISONE SOD SUC (PF) 250 MG IJ SOLR
200.0000 mg | Freq: Once | INTRAMUSCULAR | Status: AC
  Administered 2021-11-01: 200 mg via INTRAVENOUS
  Filled 2021-11-01: qty 200

## 2021-11-01 MED ORDER — ENOXAPARIN SODIUM 60 MG/0.6ML IJ SOSY
50.0000 mg | PREFILLED_SYRINGE | INTRAMUSCULAR | Status: DC
  Administered 2021-11-01: 50 mg via SUBCUTANEOUS
  Filled 2021-11-01: qty 0.6

## 2021-11-01 MED ORDER — ONDANSETRON HCL 4 MG/2ML IJ SOLN: 4.0000 mg | Freq: Four times a day (QID) | INTRAMUSCULAR | Status: DC | PRN

## 2021-11-01 MED ORDER — HYDROMORPHONE HCL 1 MG/ML IJ SOLN
1.0000 mg | Freq: Once | INTRAMUSCULAR | Status: AC
  Administered 2021-11-01: 1 mg via INTRAVENOUS
  Filled 2021-11-01: qty 1

## 2021-11-01 MED ORDER — ATORVASTATIN CALCIUM 40 MG PO TABS
40.0000 mg | ORAL_TABLET | Freq: Every day | ORAL | Status: DC
  Administered 2021-11-01 – 2021-11-02 (×2): 40 mg via ORAL
  Filled 2021-11-01 (×2): qty 1

## 2021-11-01 NOTE — Assessment & Plan Note (Signed)
Home medication includes Valium 1 to 2 mg every 12 hours as needed anxiety/muscle spasms and Prozac 20 mg twice daily. -Continue current regimen

## 2021-11-01 NOTE — H&P (Signed)
History and Physical    PatientLashan Bowen ANV:[id number] DOB: 1965/12/23 DOA: 10/31/2021 DOS: the patient was seen and examined on 11/01/2021 PCP: Mancel Bale, PA-C  Patient coming from: Transfer from [facility]  Chief Complaint:  Chief Complaint  Patient presents with   Flank Pain    HPI: [name] is a 56 y.o. female with medical history significant of hypertension, DM type II, nephrolithiasis, anxiety, morbid obesity, and OSA presents with complaints of right-sided flank pain that started yesterday.  She describes it as a sharp stabbing pain that radiates from her right side of back and wraps around to her lower abdomen.  She has been taking Flomax and passed previous kidney stone from when she came to the emergency department on 1/23.  At that time CT scan showed a UPJ stone present with mild obstructive features on the right.  After getting home patient reports that she did pass the stone and noted a little blood in her urine as well as a little discomfort while peeing.    Patient was noted to be afebrile with blood pressures elevated up to 171/73, and all other vital signs maintained.  Labs from yesterday 2/4 significant for WBC 10.9, BUN 22, creatinine 0.79, lipase 56, and lactic acid 1.3.  CT scan of the abdomen pelvis was obtained with a 5 mm nonobstructing stone of the lower pole of the right kidney and the previous UVJ stone no longer visualized urinalysis noted trace hemoglobin, elevated specific gravity greater than 1.03, 21-50 squamous epithelial cells, and many bacteria thought to be likely a contaminant.  She had been sent to [facility] ED for pelvic ultrasound to evaluate her ovaries, but it was a poor quality.  OB/GYN has been consulted but did not feel like symptoms were related to ovarian torsion given available imaging.  She received multiple rounds of pain medication and continued to complain of intractable nausea.  She had received pretreatment with hydrocortisone to  obtain CTA of the abdomen pelvis due to a contrast allergy.  CTA was obtained to rule out other possible causes including mesenteric ischemia, but no acute findings were appreciated.  After CT patient complained of itching and feeling like her throat was closing.  Patient was noted to be protecting her airway and O2 saturations maintained, but patient was given dose of epinephrine.  Due to continued pain TRH called to admit.   Review of Systems: As mentioned in the history of present illness. All other systems reviewed and are negative. Past Medical History:  Diagnosis Date   Anxiety    Arthritis    Diabetes mellitus    Endometriosis    Hypersomnia, persistent    epworth 16    Hypertension    Lyme borreliosis    Migraine    Morbid obesity ([place])    Obesity, Class III, BMI 40-49.9 (morbid obesity) ([place])    Obstructive sleep apnea    CPAP NIGHTLY; sleep study 2007.   Renal disorder    kidney stone   Past Surgical History:  Procedure Laterality Date   ABLATION ON ENDOMETRIOSIS     ABLATION ON ENDOMETRIOSIS     10 years ago   CARDIAC CATHETERIZATION N/A 02/20/2016   Procedure: Left Heart Cath and Coronary Angiography;  Surgeon: [name], MD;  Location: [facility];  Service: Cardiovascular;  Laterality: N/A;   CHOLECYSTECTOMY     KNEE ARTHROCENTESIS     KNEE SURGERY     TUBAL LIGATION     Social  History:  reports that she has quit smoking. Her smoking use included cigarettes. She smoked an average of 0.20 packs per day. She has never used smokeless tobacco. She reports that she does not drink alcohol and does not use drugs.  Allergies  Allergen Reactions   Contrast Media [Iodinated Contrast Media] Hives   Darvocet [Propoxyphene N-Acetaminophen] Hives and Itching   Lisinopril Cough   Metformin And Related Other (See Comments)    Chest pain   Morphine And Related Other (See Comments)    headache   Percocet [Oxycodone-Acetaminophen] Itching    And headache   Robaxin  [Methocarbamol] Other (See Comments)    Headache    Wellbutrin [Bupropion Hcl] Other (See Comments)    Hears voices   Zofran [Ondansetron Hcl] Itching    Family History  Problem Relation Age of Onset   Diabetes Mother    Thyroid disease Mother    Depression Mother        with anxiety   Stroke Mother    Diabetes Father    Hypertension Father    Diabetes Sister    Hypertension Sister    Hypertension Brother     Prior to Admission medications   Medication Sig Start Date End Date Taking? Authorizing Provider  albuterol (PROVENTIL HFA;VENTOLIN HFA) 108 (90 Base) MCG/ACT inhaler Inhale 2 puffs into the lungs every 6 (six) hours as needed for wheezing or shortness of breath. 02/28/18  Yes Florencia Reasons, MD  atorvastatin (LIPITOR) 40 MG tablet Take 1 tablet (40 mg total) by mouth daily. 04/24/18  Yes Weber, Sarah L, PA-C  BIOTIN PO Take 1 capsule by mouth daily.   Yes [provider]  blood glucose meter kit and supplies KIT by Does not apply route in the morning and at bedtime.   Yes [provider]  diazepam (VALIUM) 2 MG tablet Take 0.5-1 tablets (1-2 mg total) every 12 (twelve) hours as needed by mouth for anxiety or muscle spasms. 08/16/17  Yes McVey, Gelene Mink, PA-C  diclofenac Sodium (VOLTAREN) 1 % GEL Apply 2 g topically 4 (four) times daily as needed. Patient taking differently: Apply 2 g topically 4 (four) times daily as needed (stomach pain). 03/12/21  Yes Long, Wonda Olds, MD  ergocalciferol (VITAMIN D2) 1.25 MG (50000 UT) capsule Take 50,000 Units by mouth every Saturday. 01/12/21  Yes [provider]  ferrous sulfate 325 (65 FE) MG tablet Take 325 mg by mouth daily with breakfast.   Yes [provider]  FLUoxetine (PROZAC) 20 MG capsule Take 1 capsule (20 mg total) by mouth 2 (two) times daily. 04/20/18  Yes Weber, Sarah L, PA-C  furosemide (LASIX) 20 MG tablet Take 2 tablets (40 mg total) by mouth daily. 05/23/18 11/01/21 Yes Weber, Damaris Hippo, PA-C   gemfibrozil (LOPID) 600 MG tablet Take 1 tablet (600 mg total) by mouth 2 (two) times daily before a meal. 04/18/18  Yes Weber, Sarah L, PA-C  Insulin Glargine (TOUJEO SOLOSTAR) 300 UNIT/ML SOPN Inject 50 Units into the skin 2 (two) times daily. 05/23/18  Yes Weber, Sarah L, PA-C  metoprolol tartrate (LOPRESSOR) 50 MG tablet Take 1 tablet (50 mg total) by mouth 2 (two) times daily. 04/20/18  Yes Weber, Sarah L, PA-C  naloxone [facility]) nasal spray 4 mg/0.1 mL Place 0.4 mg into the nose as needed (accidental overdose). 10/30/19  Yes [provider]  NOVOTWIST 32G X 5 MM MISC USE 1 APPLICATION BY DOES NOT APPLY ROUTE DAILY. 04/20/18  Yes Weber, Damaris Hippo,  PA-C  omeprazole (PRILOSEC OTC) 20 MG tablet Take 20 mg by mouth daily.   Yes [provider]  pantoprazole (PROTONIX) 40 MG tablet Take 40 mg by mouth 2 (two) times daily. 05/26/21  Yes [provider]  potassium chloride SA (K-DUR,KLOR-CON) 20 MEQ tablet Take 2 tablets (40 mEq total) by mouth daily. 04/20/18  Yes Weber, Damaris Hippo, PA-C  progesterone (PROMETRIUM) 200 MG capsule Take 200 mg by mouth at bedtime. 10/09/20  Yes [provider]  promethazine (PHENERGAN) 25 MG tablet Take 1 tablet (25 mg total) by mouth every 8 (eight) hours as needed for nausea or vomiting. 11/11/17  Yes Quintella Reichert, MD  Semaglutide, 2 MG/DOSE, (OZEMPIC, 2 MG/DOSE,) 8 MG/3ML SOPN Inject 2 mg into the skin every Wednesday. 01/26/21  Yes [provider]  tamsulosin (FLOMAX) 0.4 MG CAPS capsule Take 1 capsule (0.4 mg total) by mouth daily. 10/20/21  Yes Dykstra, Ellwood Dense, MD  vitamin B-12 (CYANOCOBALAMIN) 1000 MCG tablet Take 1,000 mcg by mouth daily.   Yes [provider]  celecoxib (CELEBREX) 200 MG capsule Take 1 capsule (200 mg total) by mouth daily. Patient not taking: Reported on 11/01/2021 04/20/18   Mancel Bale, PA-C  Continuous Blood Gluc Sensor (FREESTYLE LIBRE 14 DAY SENSOR) MISC 1 application by Does not apply route every  14 (fourteen) days. 04/20/18   Gale Journey, Damaris Hippo, PA-C  cyclobenzaprine (FLEXERIL) 10 MG tablet Take 1 tablet (10 mg total) by mouth 3 (three) times daily as needed for muscle spasms. Patient not taking: Reported on 11/01/2021 07/12/18   Maczis, Barth Kirks, PA-C  HYDROcodone-acetaminophen (NORCO/VICODIN) 5-325 MG tablet Take 1 tablet by mouth every 6 (six) hours as needed for severe pain. Patient not taking: Reported on 11/01/2021 03/12/21   Long, Wonda Olds, MD  liraglutide (VICTOZA) 18 MG/3ML SOPN Inject 0.3 mLs (1.8 mg total) into the skin daily. INJECT 0.3MLS SUBCUTANEOUSLY DAILY Patient not taking: Reported on 11/01/2021 05/23/18   Mancel Bale, PA-C  metoCLOPramide (REGLAN) 10 MG tablet Take 1 tablet (10 mg total) by mouth every 8 (eight) hours as needed for nausea. Patient not taking: Reported on 11/01/2021 10/20/21   Lucrezia Starch, MD  senna-docusate (SENOKOT-S) 8.6-50 MG tablet Take 1 tablet by mouth at bedtime as needed for mild constipation or moderate constipation. Patient not taking: Reported on 11/01/2021 03/12/21   Long, Wonda Olds, MD  Ranitidine HCl (ZANTAC PO) Take 1 tablet by mouth 2 (two) times daily. Patient uses this medication for heartburn.  06/03/13  [provider]    Physical Exam: Vitals:   11/01/21 0900 11/01/21 1045 11/01/21 1100 11/01/21 1145  BP: (!) 165/99 (!) 171/73 (!) 165/79 (!) 162/84  Pulse: 74 (!) 101 96 90  Resp:  $Remo'20 14 16  'Yudsm$ Temp:      TempSrc:      SpO2: 96% 98% 95% 96%  Weight:      Height:         Constitutional: Morbidly obese middle-aged female currently in distress able to follow commands Eyes: PERRL, lids and conjunctivae normal ENMT: Mucous membranes are moist.  Neck: Increased neck circumference appreciated.  No JVD appreciated. Respiratory: Normal respiratory effort without significant wheezes or rhonchi appreciated at this time.  O2 saturations maintained on room air. Cardiovascular: Regular rate and rhythm, no murmurs / rubs / gallops. No  extremity edema.   Abdomen: Tenderness palpation along the right flank with radiation down.  Bowel sounds present all 4 quadrants. Musculoskeletal: no clubbing / cyanosis.  No joint deformity upper and lower extremities.  Skin: no rashes, lesions, ulcers. No induration Neurologic: CN 2-12 grossly intact. Strength 5/5 in all 4.  Psychiatric: Normal judgment and insight. Alert and oriented x 3. Normal mood.    Data Reviewed: Imaging studies  Assessment and Plan: * Abdominal pain suspected secondary to nephrolithiasis- (present on admission) Patient presented with complaints of severe right-sided flank pain that appears to similar to pain from 1/24. Patient reports that she did pass a kidney stone thereafter.    Lipase levels have been mildly elevated, but symptoms are not consistent with pancreatitis.  Renal CT from 1/24 noted mild obstructive uropathy with a 3 mm stone present at the right UVJ junction. Repeat imaging noted 5 mm right-sided kidney stone present without obstruction.  She also was evaluated with a vaginal ultrasound in her case was discussed with OB/GYN who felt symptoms less likely ovarian torsion based off imaging and age.  CTA obtained due to the severity of patient's symptoms, but no signs of mesenteric ischemia.  At this time question if symptoms are related to nephrolithiasis although stone currently present noted to be nonobstructing. -N.p.o. -Normal saline IV fluids 100 mL/h -Flomax -Hydrocodone as needed for pain -Likely needs referral to urology in outpatient setting  Leukocytosis- (present on admission) WBC was elevated to 10.9 on 2/4.  No clear source of infection appreciated at this time.  Question if reactive in nature as repeat WBC was within normal limits.  DM (diabetes mellitus), type 2 (HCC) Insulin regimen includes semaglutide 2 mg q. Wednesday and glargine 50 units twice daily -Hypoglycemic protocols -CBGs before every meal with moderate SSI -Adjust insulin  regimen as needed -Resume home glargine once able to tolerate p.o.  HTN (hypertension)- (present on admission) - Continue metoprolol  Anxiety- (present on admission) Home medication includes Valium 1 to 2 mg every 12 hours as needed anxiety/muscle spasms and Prozac 20 mg twice daily. -Continue current regimen  OSA-(present on admission)   Advance Care Planning:   Code Status: Full Code   Consults: None  Family Communication: None  Severity of Illness: The appropriate patient status for this patient is OBSERVATION. Observation status is judged to be reasonable and necessary in order to provide the required intensity of service to ensure the patient's safety. The patient's presenting symptoms, physical exam findings, and initial radiographic and laboratory data in the context of their medical condition is felt to place them at decreased risk for further clinical deterioration. Furthermore, it is anticipated that the patient will be medically stable for discharge from the hospital within 2 midnights of admission.   Author: Norval Morton, MD 11/01/2021 12:25 PM  For on call review [email or web address].

## 2021-11-01 NOTE — ED Notes (Signed)
Called [facility] to have purple man care handoff process initiated.

## 2021-11-01 NOTE — ED Notes (Signed)
Resting comfortable in hallway 11-- husband at bedside-- is leaving but can be reached at [phone].

## 2021-11-01 NOTE — ED Notes (Signed)
TO CT with CT tech

## 2021-11-01 NOTE — ED Notes (Signed)
Notified Smith MD that pt is requesting pain medicine

## 2021-11-01 NOTE — ED Provider Notes (Signed)
Care of the patient received from couture PA-C.  Patient was transferred to [facility] from [facility] for evaluation of possible ovarian torsion.  Patient has been having intractable right flank pain and abdominal pain.  She was seen approximately a week ago at an outside hospital and had been diagnosed with renal stones.  She presented to [facility] and had a negative CT scan for renal stones, sent here for pelvic ultrasound.  Pelvic ultrasound completed and was a technically limited examination with poor vascular evaluation of the ovaries bilaterally.  OB/GYN was consulted who did not feel that ovarian torsion was likely given normal appearance of the ovaries on CT scan and pelvic ultrasound.  The patient continued to have severe intractable 10/10 pain despite multiple pushes of Dilaudid, Toradol as well as intractable nausea.  A CTA of the abdomen and pelvis was ordered to further rule out possible mesenteric ischemia, appendicitis, cholecystitis, or any other acute cause of the patient's pain.  She had a listed contrast allergy, and had received pretreatment.  Care signed out to me pending completion of pretreatment and CT scan.  Around 10:38 AM after coming back from CT scan, the patient had endorsed itching and feeling like her throat was closing up.  O2 sats at 95%, protecting her airway.  No visible rash.  However given concern for airway compromise, she was given epinephrine.  She continues to complain of pain and nausea and was given additional Dilaudid and Phenergan which is the only medication  she states that worked for her.   CTA of the abdomen and pelvis without evidence of mesenteric ischemia.  She has a 5 mm nonobstructive stone in the lower pole of the right kidney with scattered colonic diverticulosis without diverticulitis.  He was also noted to have a fibroid uterus and a simple cyst on the left ovary.  She does have a fat-containing ventral hernia without evidence of  incarceration.  She also has degeneration in the right hip.  Plan for admission for intractable nausea, and pain.  Spoke w/ Dr. Tamala Julian who will admit the patient   This was a shared visit with my supervising physician Dr.Tegeler who independently saw and evaluated the patient & provided guidance in evaluation/management/disposition ,in agreement with care     Physical Exam  BP (!) 165/79    Pulse 96    Temp 98.1 F (36.7 C) (Oral)    Resp 14    Ht 5\' 1"  (1.549 m)    Wt 103.4 kg    LMP  (LMP Unknown)    SpO2 95%    BMI 43.08 kg/m   Physical Exam Vitals and nursing note reviewed.  Constitutional:      General: She is not in acute distress.    Appearance: She is well-developed. She is obese.  HENT:     Head: Normocephalic and atraumatic.  Eyes:     Conjunctiva/sclera: Conjunctivae normal.  Cardiovascular:     Rate and Rhythm: Normal rate and regular rhythm.     Heart sounds: No murmur heard. Pulmonary:     Effort: Pulmonary effort is normal. No respiratory distress.     Breath sounds: Normal breath sounds.  Abdominal:     Palpations: Abdomen is soft.     Tenderness: There is abdominal tenderness. There is right CVA tenderness.  Musculoskeletal:        General: No swelling.     Cervical back: Neck supple.  Skin:    General: Skin is warm and  dry.     Capillary Refill: Capillary refill takes less than 2 seconds.  Neurological:     General: No focal deficit present.     Mental Status: She is alert and oriented to person, place, and time.  Psychiatric:        Mood and Affect: Mood normal.    Procedures  Procedures Results for orders placed or performed during the hospital encounter of 10/31/21  Comprehensive metabolic panel  Result Value Ref Range   Sodium 139 135 - 145 mmol/L   Potassium 3.7 3.5 - 5.1 mmol/L   Chloride 105 98 - 111 mmol/L   CO2 26 22 - 32 mmol/L   Glucose, Bld 90 70 - 99 mg/dL   BUN 22 (H) 6 - 20 mg/dL   Creatinine, Ser 0.78 0.44 - 1.00 mg/dL    Calcium 9.2 8.9 - 10.3 mg/dL   Total Protein 6.8 6.5 - 8.1 g/dL   Albumin 3.9 3.5 - 5.0 g/dL   AST 17 15 - 41 U/L   ALT 27 0 - 44 U/L   Alkaline Phosphatase 76 38 - 126 U/L   Total Bilirubin 0.4 0.3 - 1.2 mg/dL   GFR, Estimated >60 >60 mL/min   Anion gap 8 5 - 15  Lipase, blood  Result Value Ref Range   Lipase 56 (H) 11 - 51 U/L  CBC with Differential  Result Value Ref Range   WBC 10.9 (H) 4.0 - 10.5 K/uL   RBC 4.53 3.87 - 5.11 MIL/uL   Hemoglobin 14.2 12.0 - 15.0 g/dL   HCT 41.1 36.0 - 46.0 %   MCV 90.7 80.0 - 100.0 fL   MCH 31.3 26.0 - 34.0 pg   MCHC 34.5 30.0 - 36.0 g/dL   RDW 13.8 11.5 - 15.5 %   Platelets 319 150 - 400 K/uL   nRBC 0.0 0.0 - 0.2 %   Neutrophils Relative % 61 %   Neutro Abs 6.6 1.7 - 7.7 K/uL   Lymphocytes Relative 31 %   Lymphs Abs 3.4 0.7 - 4.0 K/uL   Monocytes Relative 7 %   Monocytes Absolute 0.8 0.1 - 1.0 K/uL   Eosinophils Relative 0 %   Eosinophils Absolute 0.0 0.0 - 0.5 K/uL   Basophils Relative 1 %   Basophils Absolute 0.1 0.0 - 0.1 K/uL   Immature Granulocytes 0 %   Abs Immature Granulocytes 0.03 0.00 - 0.07 K/uL  Urinalysis, Routine w reflex microscopic Urine, Clean Catch  Result Value Ref Range   Color, Urine YELLOW YELLOW   APPearance TURBID (A) CLEAR   Specific Gravity, Urine >1.030 (H) 1.005 - 1.030   pH 5.5 5.0 - 8.0   Glucose, UA NEGATIVE NEGATIVE mg/dL   Hgb urine dipstick TRACE (A) NEGATIVE   Bilirubin Urine NEGATIVE NEGATIVE   Ketones, ur NEGATIVE NEGATIVE mg/dL   Protein, ur NEGATIVE NEGATIVE mg/dL   Nitrite NEGATIVE NEGATIVE   Leukocytes,Ua NEGATIVE NEGATIVE  Lactic acid, plasma  Result Value Ref Range   Lactic Acid, Venous 1.5 0.5 - 1.9 mmol/L  Lactic acid, plasma  Result Value Ref Range   Lactic Acid, Venous 1.3 0.5 - 1.9 mmol/L  Urinalysis, Microscopic (reflex)  Result Value Ref Range   RBC / HPF 0-5 0 - 5 RBC/hpf   WBC, UA 6-10 0 - 5 WBC/hpf   Bacteria, UA MANY (A) NONE SEEN   Squamous Epithelial / LPF 21-50 0 -  5   Mucus PRESENT    CT Renal Stone Study  Result Date: 10/31/2021 CLINICAL DATA:  Right flank pain EXAM: CT ABDOMEN AND PELVIS WITHOUT CONTRAST TECHNIQUE: Multidetector CT imaging of the abdomen and pelvis was performed following the standard protocol without IV contrast. RADIATION DOSE REDUCTION: This exam was performed according to the departmental dose-optimization program which includes automated exposure control, adjustment of the mA and/or kV according to patient size and/or use of iterative reconstruction technique. COMPARISON:  10/20/2021 FINDINGS: Lower chest: No acute abnormality Hepatobiliary: No focal liver abnormality is seen. Status post cholecystectomy. No biliary dilatation. Pancreas: No focal abnormality or ductal dilatation. Spleen: No focal abnormality.  Normal size. Adrenals/Urinary Tract: 5 mm nonobstructing stone in the lower pole of the right kidney. No ureteral stones or hydronephrosis. Previously seen right UVJ stone no longer visualized. Adrenal glands and urinary bladder unremarkable. Stomach/Bowel: Diffuse colonic diverticulosis. No active diverticulitis. Stomach and small bowel decompressed, grossly unremarkable. Appendix not visualized. No pericecal inflammation. Vascular/Lymphatic: Aortic atherosclerosis. No evidence of aneurysm or adenopathy. Reproductive: 2.7 cm left ovarian cyst, decreased in size since prior study. Uterus and right adnexa unremarkable. Other: No free fluid or free air. Musculoskeletal: No acute bony abnormality. IMPRESSION: No ureteral stones or hydronephrosis. Right lower pole nephrolithiasis. Diffuse colonic diverticulosis.  No active diverticulitis. No acute findings. Electronically Signed   By: Rolm Baptise M.D.   On: 10/31/2021 20:51   CT Renal Stone Study  Result Date: 10/20/2021 CLINICAL DATA:  Right flank pain and hematuria. EXAM: CT ABDOMEN AND PELVIS WITHOUT CONTRAST TECHNIQUE: Multidetector CT imaging of the abdomen and pelvis was performed  following the standard protocol without IV contrast. RADIATION DOSE REDUCTION: This exam was performed according to the departmental dose-optimization program which includes automated exposure control, adjustment of the mA and/or kV according to patient size and/or use of iterative reconstruction technique. COMPARISON:  CT without contrast 11/11/2017. FINDINGS: Lower chest: No acute abnormality. Hepatobiliary: 20 cm length mildly steatotic liver. No focal abnormality is seen without contrast. Old cholecystectomy with no biliary dilatation. Pancreas: There is mild pancreatic atrophy. No ductal dilatation or mass seen without contrast. No adjacent edema. Spleen: Unremarkable without contrast.  No splenomegaly. Adrenals/Urinary Tract: There is no adrenal mass , no focal abnormality in the unenhanced renal cortex. 7 mm nonobstructing caliceal stone noted lower pole right kidney. There are few punctate nonobstructing caliceal stones in the upper pole left kidney. On the right there is mild hydroureteronephrosis due to a 3 mm UVJ stone. There is asymmetric right perinephric edema and trace perinephric fluid. There is no bladder thickening. Stomach/Bowel: No dilatation or wall thickening. Bowel nonrotation is again noted with the large bowel in the midline and left abdomen and pelvis, and small bowel mostly in the right abdomen. An appendix is not seen. There is diffuse colonic diverticulosis without evidence of colitis or diverticulitis. The cecum is in the anterior midline upper pelvis. Vascular/Lymphatic: Aortoiliac atherosclerosis. No enlarged abdominal or pelvic lymph nodes. Reproductive: The uterus is intact. There is a pedunculated 2 cm fibroid off the right anterior fundus. Right ovary is obscured by unopacified bowel. Left ovary demonstrates a 3.4 cm low-density lesion of 32.6 Hounsfield units which in 2019 measured 2.1 cm and 25.1 Hounsfield units. There are multiple pelvic phleboliths. Other: Small chronic  umbilical and inguinal fat hernias. There are no free air, hemorrhage or fluid. Musculoskeletal: There are degenerative changes of the thoracic and lumbar spine, slight discogenic retrolisthesis L3-4. Moderate right greater than left hip DJD. Slight lumbar dextroscoliosis. IMPRESSION: 1. 3 mm right UVJ stone with mild obstructive  uropathy. Please correlate clinically for infectious complication. 2. Nonobstructive nephrolithiasis. 3. Mild hepatic steatosis. 4. Diverticulosis without diverticulitis. 5. Enlarging low-density lesion of the left ovary of 3.4 cm, previously 2.1 cm, above the usual density of fluid. Nonemergent follow-up pelvic ultrasound or MRI without and with contrast is recommended to assess the internal architecture and to evaluate for color flow or enhancement. 6. Umbilical and inguinal fat hernias. 7. Aortic atherosclerosis and remaining findings described above. Electronically Signed   By: Telford Nab M.D.   On: 10/20/2021 21:27   US PELVIC COMPLETE W TRANSVAGINAL AND TORSION R/O  Result Date: 11/01/2021 CLINICAL DATA:  Pelvic pain EXAM: TRANSABDOMINAL AND TRANSVAGINAL ULTRASOUND OF PELVIS DOPPLER ULTRASOUND OF OVARIES TECHNIQUE: Both transabdominal and transvaginal ultrasound examinations of the pelvis were performed. Transabdominal technique was performed for global imaging of the pelvis including uterus, ovaries, adnexal regions, and pelvic cul-de-sac. It was necessary to proceed with endovaginal exam following the transabdominal exam to visualize the ovaries bilaterally. Color and duplex Doppler ultrasound was utilized to evaluate blood flow to the ovaries. COMPARISON:  None. FINDINGS: Uterus Measurements: 7.8 x 5.1 x 5.5 cm = volume: 114 mL. At least 2 heterogeneously hypoechoic intramural masses are seen within the uterus measuring up to 18 mm and 23 mm most in keeping with multiple intrauterine fibroids. Endometrium Thickness: 10 mm.  No focal abnormality visualized. Right ovary  Measurements: 3.3 x 1.4 x 1.6 cm = volume: 4 mL. Normal appearance/no adnexal mass. Left ovary Measurements: 4.2 x 3.7 x 3.5 cm = volume: 29 mL. A 2.5 x 2.8 x 2.2 cm simple cyst is seen within the left ovary. Pulsed Doppler evaluation of both ovaries are of technically limited nature and ovarian vascularity is not well assessed on this examination. Other findings No abnormal free fluid. IMPRESSION: Technically limited examination with poor vascular evaluation of the ovaries bilaterally. Morphologic appearance of the ovaries, however, are normal. 2.8 cm left ovarian simple cyst. No followup imaging recommended. Note: This recommendation does not apply to premenarchal patients or to those with increased risk (genetic, family history, elevated tumor markers or other high-risk factors) of ovarian cancer. Reference: Radiology 2019 Nov; 293(2):359-371. Thickened endometrium. In the postmenopausal patient, endometrial sampling is indicated to exclude carcinoma. If results are benign, sonohysterogram should be considered for focal lesion work-up. (Ref: Radiological Reasoning: Algorithmic Workup of Abnormal Vaginal Bleeding with Endovaginal Sonography and Sonohysterography. AJR 2008; 903:Y33-38) Multiple small uterine fibroids. Electronically Signed   By: Fidela Salisbury M.D.   On: 11/01/2021 00:28    ED Course / MDM    Medical Decision Making Amount and/or Complexity of Data Reviewed Labs: ordered. Radiology: ordered and independent interpretation performed. ECG/medicine tests: ordered.  Risk Prescription drug management. Parenteral controlled substances. Decision regarding hospitalization.          Garald Balding, PA-C 11/01/21 1243    Tegeler, Gwenyth Allegra, MD 11/01/21 [phone]

## 2021-11-01 NOTE — Assessment & Plan Note (Signed)
Continue metoprolol. 

## 2021-11-01 NOTE — Discharge Instructions (Signed)
Your ultrasound showed that you have a thickened endometrium.  You will need to follow-up with your OB/GYN about this and have further testing to rule out cancer as a possible cause of this finding.  Please follow up with your primary care provider within 5-7 days for re-evaluation of your symptoms. If you do not have a primary care provider, information for a healthcare clinic has been provided for you to make arrangements for follow up care. Please return to the emergency department for any new or worsening symptoms.

## 2021-11-01 NOTE — Assessment & Plan Note (Addendum)
Patient presented with complaints of severe right-sided flank pain that appears to similar to pain from 1/24.  Lipase levels have been mildly elevated, but symptoms are not consistent with pancreatitis.  Renal CT from 1/24 noted mild obstructive uropathy with a 3 mm stone present at the right UVJ junction.  Patient notes that she did pass a kidney stone thereafter.  Repeat imaging noted 5 mm right-sided kidney stone present without obstruction.  She also was evaluated with a vaginal ultrasound in her case was discussed with OB/GYN who felt symptoms less likely ovarian torsion based off imaging and age.  CTA obtained due to the severity of patient's symptoms, but no signs of mesenteric ischemia.  At this time question if symptoms are related to nephrolithiasis although stone currently present noted to be nonobstructing. -N.p.o. -Normal saline IV fluids 100 mL/h -Flomax -Hydrocodone as needed for pain -Likely needs referral to urology in outpatient setting

## 2021-11-01 NOTE — ED Notes (Signed)
Pt c/o itchy throat, hoarseness, "feels like something is closing up"  Dr. Sherry Ruffing made aware

## 2021-11-01 NOTE — ED Provider Notes (Signed)
Pt transferred from OSH for Pelvic US after presenting to the ED with right flank and abd pain.   Physical Exam  BP (!) 131/109 (BP Location: Right Arm)    Pulse 80    Temp 98.1 F (36.7 C) (Oral)    Resp 16    Ht 5\' 1"  (1.549 m)    Wt 103.4 kg    LMP  (LMP Unknown)    SpO2 100%    BMI 43.08 kg/m   Physical Exam Vitals and nursing note reviewed.  Constitutional:      General: She is not in acute distress.    Appearance: She is well-developed.  HENT:     Head: Normocephalic and atraumatic.  Eyes:     Conjunctiva/sclera: Conjunctivae normal.  Cardiovascular:     Rate and Rhythm: Normal rate and regular rhythm.     Heart sounds: No murmur heard. Pulmonary:     Effort: Pulmonary effort is normal. No respiratory distress.     Breath sounds: Normal breath sounds.  Abdominal:     General: Bowel sounds are normal.     Palpations: Abdomen is soft.     Tenderness: There is abdominal tenderness in the right lower quadrant. There is right CVA tenderness and guarding. There is no rebound.  Musculoskeletal:        General: No swelling.     Cervical back: Neck supple.  Skin:    General: Skin is warm and dry.     Capillary Refill: Capillary refill takes less than 2 seconds.  Neurological:     Mental Status: She is alert.  Psychiatric:        Mood and Affect: Mood normal.    Procedures  Procedures Results for orders placed or performed during the hospital encounter of 10/31/21  Comprehensive metabolic panel  Result Value Ref Range   Sodium 139 135 - 145 mmol/L   Potassium 3.7 3.5 - 5.1 mmol/L   Chloride 105 98 - 111 mmol/L   CO2 26 22 - 32 mmol/L   Glucose, Bld 90 70 - 99 mg/dL   BUN 22 (H) 6 - 20 mg/dL   Creatinine, Ser 0.78 0.44 - 1.00 mg/dL   Calcium 9.2 8.9 - 10.3 mg/dL   Total Protein 6.8 6.5 - 8.1 g/dL   Albumin 3.9 3.5 - 5.0 g/dL   AST 17 15 - 41 U/L   ALT 27 0 - 44 U/L   Alkaline Phosphatase 76 38 - 126 U/L   Total Bilirubin 0.4 0.3 - 1.2 mg/dL   GFR, Estimated >60 >60  mL/min   Anion gap 8 5 - 15  Lipase, blood  Result Value Ref Range   Lipase 56 (H) 11 - 51 U/L  CBC with Differential  Result Value Ref Range   WBC 10.9 (H) 4.0 - 10.5 K/uL   RBC 4.53 3.87 - 5.11 MIL/uL   Hemoglobin 14.2 12.0 - 15.0 g/dL   HCT 41.1 36.0 - 46.0 %   MCV 90.7 80.0 - 100.0 fL   MCH 31.3 26.0 - 34.0 pg   MCHC 34.5 30.0 - 36.0 g/dL   RDW 13.8 11.5 - 15.5 %   Platelets 319 150 - 400 K/uL   nRBC 0.0 0.0 - 0.2 %   Neutrophils Relative % 61 %   Neutro Abs 6.6 1.7 - 7.7 K/uL   Lymphocytes Relative 31 %   Lymphs Abs 3.4 0.7 - 4.0 K/uL   Monocytes Relative 7 %   Monocytes Absolute 0.8 0.1 -  1.0 K/uL   Eosinophils Relative 0 %   Eosinophils Absolute 0.0 0.0 - 0.5 K/uL   Basophils Relative 1 %   Basophils Absolute 0.1 0.0 - 0.1 K/uL   Immature Granulocytes 0 %   Abs Immature Granulocytes 0.03 0.00 - 0.07 K/uL  Urinalysis, Routine w reflex microscopic Urine, Clean Catch  Result Value Ref Range   Color, Urine YELLOW YELLOW   APPearance TURBID (A) CLEAR   Specific Gravity, Urine >1.030 (H) 1.005 - 1.030   pH 5.5 5.0 - 8.0   Glucose, UA NEGATIVE NEGATIVE mg/dL   Hgb urine dipstick TRACE (A) NEGATIVE   Bilirubin Urine NEGATIVE NEGATIVE   Ketones, ur NEGATIVE NEGATIVE mg/dL   Protein, ur NEGATIVE NEGATIVE mg/dL   Nitrite NEGATIVE NEGATIVE   Leukocytes,Ua NEGATIVE NEGATIVE  Lactic acid, plasma  Result Value Ref Range   Lactic Acid, Venous 1.5 0.5 - 1.9 mmol/L  Lactic acid, plasma  Result Value Ref Range   Lactic Acid, Venous 1.3 0.5 - 1.9 mmol/L  Urinalysis, Microscopic (reflex)  Result Value Ref Range   RBC / HPF 0-5 0 - 5 RBC/hpf   WBC, UA 6-10 0 - 5 WBC/hpf   Bacteria, UA MANY (A) NONE SEEN   Squamous Epithelial / LPF 21-50 0 - 5   Mucus PRESENT    CT Renal Stone Study  Result Date: 10/31/2021 CLINICAL DATA:  Right flank pain EXAM: CT ABDOMEN AND PELVIS WITHOUT CONTRAST TECHNIQUE: Multidetector CT imaging of the abdomen and pelvis was performed following the  standard protocol without IV contrast. RADIATION DOSE REDUCTION: This exam was performed according to the departmental dose-optimization program which includes automated exposure control, adjustment of the mA and/or kV according to patient size and/or use of iterative reconstruction technique. COMPARISON:  10/20/2021 FINDINGS: Lower chest: No acute abnormality Hepatobiliary: No focal liver abnormality is seen. Status post cholecystectomy. No biliary dilatation. Pancreas: No focal abnormality or ductal dilatation. Spleen: No focal abnormality.  Normal size. Adrenals/Urinary Tract: 5 mm nonobstructing stone in the lower pole of the right kidney. No ureteral stones or hydronephrosis. Previously seen right UVJ stone no longer visualized. Adrenal glands and urinary bladder unremarkable. Stomach/Bowel: Diffuse colonic diverticulosis. No active diverticulitis. Stomach and small bowel decompressed, grossly unremarkable. Appendix not visualized. No pericecal inflammation. Vascular/Lymphatic: Aortic atherosclerosis. No evidence of aneurysm or adenopathy. Reproductive: 2.7 cm left ovarian cyst, decreased in size since prior study. Uterus and right adnexa unremarkable. Other: No free fluid or free air. Musculoskeletal: No acute bony abnormality. IMPRESSION: No ureteral stones or hydronephrosis. Right lower pole nephrolithiasis. Diffuse colonic diverticulosis.  No active diverticulitis. No acute findings. Electronically Signed   By: Rolm Baptise M.D.   On: 10/31/2021 20:51   CT Renal Stone Study  Result Date: 10/20/2021 CLINICAL DATA:  Right flank pain and hematuria. EXAM: CT ABDOMEN AND PELVIS WITHOUT CONTRAST TECHNIQUE: Multidetector CT imaging of the abdomen and pelvis was performed following the standard protocol without IV contrast. RADIATION DOSE REDUCTION: This exam was performed according to the departmental dose-optimization program which includes automated exposure control, adjustment of the mA and/or kV according  to patient size and/or use of iterative reconstruction technique. COMPARISON:  CT without contrast 11/11/2017. FINDINGS: Lower chest: No acute abnormality. Hepatobiliary: 20 cm length mildly steatotic liver. No focal abnormality is seen without contrast. Old cholecystectomy with no biliary dilatation. Pancreas: There is mild pancreatic atrophy. No ductal dilatation or mass seen without contrast. No adjacent edema. Spleen: Unremarkable without contrast.  No splenomegaly. Adrenals/Urinary Tract: There is  no adrenal mass , no focal abnormality in the unenhanced renal cortex. 7 mm nonobstructing caliceal stone noted lower pole right kidney. There are few punctate nonobstructing caliceal stones in the upper pole left kidney. On the right there is mild hydroureteronephrosis due to a 3 mm UVJ stone. There is asymmetric right perinephric edema and trace perinephric fluid. There is no bladder thickening. Stomach/Bowel: No dilatation or wall thickening. Bowel nonrotation is again noted with the large bowel in the midline and left abdomen and pelvis, and small bowel mostly in the right abdomen. An appendix is not seen. There is diffuse colonic diverticulosis without evidence of colitis or diverticulitis. The cecum is in the anterior midline upper pelvis. Vascular/Lymphatic: Aortoiliac atherosclerosis. No enlarged abdominal or pelvic lymph nodes. Reproductive: The uterus is intact. There is a pedunculated 2 cm fibroid off the right anterior fundus. Right ovary is obscured by unopacified bowel. Left ovary demonstrates a 3.4 cm low-density lesion of 32.6 Hounsfield units which in 2019 measured 2.1 cm and 25.1 Hounsfield units. There are multiple pelvic phleboliths. Other: Small chronic umbilical and inguinal fat hernias. There are no free air, hemorrhage or fluid. Musculoskeletal: There are degenerative changes of the thoracic and lumbar spine, slight discogenic retrolisthesis L3-4. Moderate right greater than left hip DJD.  Slight lumbar dextroscoliosis. IMPRESSION: 1. 3 mm right UVJ stone with mild obstructive uropathy. Please correlate clinically for infectious complication. 2. Nonobstructive nephrolithiasis. 3. Mild hepatic steatosis. 4. Diverticulosis without diverticulitis. 5. Enlarging low-density lesion of the left ovary of 3.4 cm, previously 2.1 cm, above the usual density of fluid. Nonemergent follow-up pelvic ultrasound or MRI without and with contrast is recommended to assess the internal architecture and to evaluate for color flow or enhancement. 6. Umbilical and inguinal fat hernias. 7. Aortic atherosclerosis and remaining findings described above. Electronically Signed   By: Telford Nab M.D.   On: 10/20/2021 21:27   US PELVIC COMPLETE W TRANSVAGINAL AND TORSION R/O  Result Date: 11/01/2021 CLINICAL DATA:  Pelvic pain EXAM: TRANSABDOMINAL AND TRANSVAGINAL ULTRASOUND OF PELVIS DOPPLER ULTRASOUND OF OVARIES TECHNIQUE: Both transabdominal and transvaginal ultrasound examinations of the pelvis were performed. Transabdominal technique was performed for global imaging of the pelvis including uterus, ovaries, adnexal regions, and pelvic cul-de-sac. It was necessary to proceed with endovaginal exam following the transabdominal exam to visualize the ovaries bilaterally. Color and duplex Doppler ultrasound was utilized to evaluate blood flow to the ovaries. COMPARISON:  None. FINDINGS: Uterus Measurements: 7.8 x 5.1 x 5.5 cm = volume: 114 mL. At least 2 heterogeneously hypoechoic intramural masses are seen within the uterus measuring up to 18 mm and 23 mm most in keeping with multiple intrauterine fibroids. Endometrium Thickness: 10 mm.  No focal abnormality visualized. Right ovary Measurements: 3.3 x 1.4 x 1.6 cm = volume: 4 mL. Normal appearance/no adnexal mass. Left ovary Measurements: 4.2 x 3.7 x 3.5 cm = volume: 29 mL. A 2.5 x 2.8 x 2.2 cm simple cyst is seen within the left ovary. Pulsed Doppler evaluation of both  ovaries are of technically limited nature and ovarian vascularity is not well assessed on this examination. Other findings No abnormal free fluid. IMPRESSION: Technically limited examination with poor vascular evaluation of the ovaries bilaterally. Morphologic appearance of the ovaries, however, are normal. 2.8 cm left ovarian simple cyst. No followup imaging recommended. Note: This recommendation does not apply to premenarchal patients or to those with increased risk (genetic, family history, elevated tumor markers or other high-risk factors) of ovarian cancer. Reference: Radiology  2019 Nov; 293(2):359-371. Thickened endometrium. In the postmenopausal patient, endometrial sampling is indicated to exclude carcinoma. If results are benign, sonohysterogram should be considered for focal lesion work-up. (Ref: Radiological Reasoning: Algorithmic Workup of Abnormal Vaginal Bleeding with Endovaginal Sonography and Sonohysterography. AJR 2008; 761:P50-93) Multiple small uterine fibroids. Electronically Signed   By: Fidela Salisbury M.D.   On: 11/01/2021 00:28     ED Course / MDM    Medical Decision Making Amount and/or Complexity of Data Reviewed Labs: ordered. Radiology: ordered. ECG/medicine tests: ordered.  Risk Prescription drug management.   56 year old female presents the emergency department today for evaluation of right flank and right abdominal pain.  She was seen at an outside hospital and at that time had a CT renal scan completed due to concerns for a kidney stone.  This was negative for ureteral stone she was sent here for further evaluation with a pelvic ultrasound to rule out ovarian torsion.  Pelvic ultrasound was completed and shows -  Technically limited examination with poor vascular evaluation of the ovaries bilaterally. Morphologic appearance of the ovaries, however, are normal. 2.8 cm left ovarian simple cyst. No followup imaging recommended. Note: This recommendation does not apply to  premenarchal patients or to those with increased risk (genetic, family history, elevated tumor markers or other high-risk factors) of ovarian cancer. Reference: Radiology 2019 Nov; 293(2):359-371. Thickened endometrium. In the postmenopausal patient, endometrial sampling is indicated to exclude carcinoma. If results are benign, sonohysterogram should be considered for focal lesion work-up  UA was reviewed and was negative.  3:27 AM CONSULT with Dr. Elonda Husky with OB/GYN.  He does not feel that ovarian torsion is unlikely given normal appearance of the ovaries on the CT scan and lack of abnormal findings on pelvic ultrasound.  He feels that he would be very unlikely for ovarian torsion to happen with in this age group and does not feel the patient needs further work-up for this.  Patient symptoms have persisted despite multiple doses of pain medications in the ED.,  Due to this reason CT imaging of the abdomen/pelvis was ordered to rule out ischemic bowel or other emergent process that was unable to be identified on CT abdomen without contrast.  At shift change pt pending CT abd/pelvis. Care transitioned to [facility] to f/u on pending CT. If CT is negative and patients pain is still not under control I would recommend admission for pain control.    Case was discussed with Dr. Randal Buba who is in agreement with the plan.    Bishop Dublin 11/01/21 [place], April, MD 11/01/21 [phone]

## 2021-11-01 NOTE — Assessment & Plan Note (Addendum)
Insulin regimen includes semaglutide 2 mg q. Wednesday and glargine 50 units twice daily -Hypoglycemic protocols -CBGs before every meal with moderate SSI -Adjust insulin regimen as needed -Resume home glargine once able to tolerate p.o.

## 2021-11-01 NOTE — ED Notes (Signed)
Assumed care of pt at this time.

## 2021-11-01 NOTE — Assessment & Plan Note (Signed)
WBC was elevated to 10.9 on 2/4.  No clear source of infection appreciated at this time.  Question if reactive in nature as repeat WBC was within normal limits.

## 2021-11-02 DIAGNOSIS — N200 Calculus of kidney: Secondary | ICD-10-CM | POA: Diagnosis not present

## 2021-11-02 DIAGNOSIS — D72829 Elevated white blood cell count, unspecified: Secondary | ICD-10-CM | POA: Diagnosis not present

## 2021-11-02 DIAGNOSIS — F419 Anxiety disorder, unspecified: Secondary | ICD-10-CM | POA: Diagnosis not present

## 2021-11-02 DIAGNOSIS — R1031 Right lower quadrant pain: Secondary | ICD-10-CM | POA: Diagnosis not present

## 2021-11-02 LAB — CBC
HCT: 38 % (ref 36.0–46.0)
Hemoglobin: 12.5 g/dL (ref 12.0–15.0)
MCH: 30.9 pg (ref 26.0–34.0)
MCHC: 32.9 g/dL (ref 30.0–36.0)
MCV: 93.8 fL (ref 80.0–100.0)
Platelets: 304 10*3/uL (ref 150–400)
RBC: 4.05 MIL/uL (ref 3.87–5.11)
RDW: 13.9 % (ref 11.5–15.5)
WBC: 10.2 10*3/uL (ref 4.0–10.5)
nRBC: 0 % (ref 0.0–0.2)

## 2021-11-02 LAB — BASIC METABOLIC PANEL
Anion gap: 10 (ref 5–15)
BUN: 16 mg/dL (ref 6–20)
CO2: 20 mmol/L — ABNORMAL LOW (ref 22–32)
Calcium: 8.1 mg/dL — ABNORMAL LOW (ref 8.9–10.3)
Chloride: 109 mmol/L (ref 98–111)
Creatinine, Ser: 0.73 mg/dL (ref 0.44–1.00)
GFR, Estimated: >60 mL/min (ref >60)
Glucose, Bld: 74 mg/dL (ref 70–99)
Potassium: 3.2 mmol/L — ABNORMAL LOW (ref 3.5–5.1)
Sodium: 139 mmol/L (ref 135–145)

## 2021-11-02 LAB — GLUCOSE, CAPILLARY
Glucose-Capillary: 99 mg/dL (ref 70–99)
Glucose-Capillary: 85 mg/dL (ref 70–99)

## 2021-11-02 LAB — HIV ANTIBODY (ROUTINE TESTING W REFLEX): HIV Screen 4th Generation wRfx: NONREACTIVE

## 2021-11-02 MED ORDER — HYDROCODONE-ACETAMINOPHEN 5-325 MG PO TABS: 1.0000 | ORAL_TABLET | Freq: Four times a day (QID) | ORAL | 0 refills | Status: AC | PRN

## 2021-11-02 MED ORDER — ACETAMINOPHEN 325 MG PO TABS: 650.0000 mg | ORAL_TABLET | Freq: Four times a day (QID) | ORAL | Status: DC | PRN

## 2021-11-02 MED ORDER — POTASSIUM CHLORIDE CRYS ER 20 MEQ PO TBCR
40.0000 meq | EXTENDED_RELEASE_TABLET | Freq: Once | ORAL | Status: AC
  Administered 2021-11-02: 40 meq via ORAL
  Filled 2021-11-02: qty 2

## 2021-11-02 NOTE — Discharge Summary (Incomplete)
[name] HFG:[id number] DOB: 1966-04-17 DOA: 10/31/2021  PCP: Mancel Bale, PA-C  Admit date: 10/31/2021 Discharge date: 11/02/2021  Admitted From: *** Disposition:  ***  Recommendations for Outpatient Follow-up:  Follow up with PCP in 1 week Please obtain BMP/CBC in one week Please follow up on the following pending results:  Home Health:***    Discharge Condition:Stable CODE STATUS:***  Diet recommendation: Heart Healthy / Carb Modified / Regular / Dysphagia  Brief/Interim Summary:   Discharge Diagnoses:  Principal Problem:   Abdominal pain suspected secondary to nephrolithiasis Active Problems:   HTN (hypertension)   DM (diabetes mellitus), type 2 (HCC)   Obesity, Class III, BMI 40-49.9 (morbid obesity) ([place])   Sleep apnea with use of continuous positive airway pressure (CPAP)   Leukocytosis   Anxiety   Nephrolithiasis    Discharge Instructions  Discharge Instructions     Call MD for:  temperature >100.4   Complete by: As directed    Diet - low sodium heart healthy   Complete by: As directed    Discharge instructions   Complete by: As directed    Make sure to hydrate throughout the day with water   Increase activity slowly   Complete by: As directed       Allergies as of 11/02/2021       Reactions   Contrast Media [iodinated Contrast Media] Hives   Darvocet [propoxyphene N-acetaminophen] Hives, Itching   Lisinopril Cough   Metformin And Related Other (See Comments)   Chest pain   Morphine And Related Other (See Comments)   headache   Percocet [oxycodone-acetaminophen] Itching   And headache   Robaxin [methocarbamol] Other (See Comments)   Headache   Wellbutrin [bupropion Hcl] Other (See Comments)   Hears voices   Zofran [ondansetron Hcl] Itching        Medication List     STOP taking these medications    omeprazole 20 MG tablet Commonly known as: PRILOSEC OTC       TAKE these medications    acetaminophen 325 MG  tablet Commonly known as: TYLENOL Take 2 tablets (650 mg total) by mouth every 6 (six) hours as needed for mild pain (or Fever >/= 101).   albuterol 108 (90 Base) MCG/ACT inhaler Commonly known as: VENTOLIN HFA Inhale 2 puffs into the lungs every 6 (six) hours as needed for wheezing or shortness of breath.   atorvastatin 40 MG tablet Commonly known as: LIPITOR Take 1 tablet (40 mg total) by mouth daily.   BIOTIN PO Take 1 capsule by mouth daily.   blood glucose meter kit and supplies Kit by Does not apply route in the morning and at bedtime.   diazepam 2 MG tablet Commonly known as: Valium Take 0.5-1 tablets (1-2 mg total) every 12 (twelve) hours as needed by mouth for anxiety or muscle spasms.   diclofenac Sodium 1 % Gel Commonly known as: Voltaren Apply 2 g topically 4 (four) times daily as needed. What changed: reasons to take this   ergocalciferol 1.25 MG (50000 UT) capsule Commonly known as: VITAMIN D2 Take 50,000 Units by mouth every Saturday.   ferrous sulfate 325 (65 FE) MG tablet Take 325 mg by mouth daily with breakfast.   FLUoxetine 20 MG capsule Commonly known as: PROzac Take 1 capsule (20 mg total) by mouth 2 (two) times daily.   FreeStyle Libre 14 Day Sensor Misc 1 application by Does not apply route every 14 (fourteen) days.   furosemide 20 MG tablet Commonly  known as: Lasix Take 2 tablets (40 mg total) by mouth daily.   gemfibrozil 600 MG tablet Commonly known as: LOPID Take 1 tablet (600 mg total) by mouth 2 (two) times daily before a meal.   Insulin Glargine 300 UNIT/ML Sopn Commonly known as: Toujeo SoloStar Inject 50 Units into the skin 2 (two) times daily.   metoprolol tartrate 50 MG tablet Commonly known as: LOPRESSOR Take 1 tablet (50 mg total) by mouth 2 (two) times daily.   naloxone 4 MG/0.1ML Liqd nasal spray kit Commonly known as: NARCAN Place 0.4 mg into the nose as needed (accidental overdose).   NovoTwist 32G X 5 MM  Misc Generic drug: Insulin Pen Needle USE 1 APPLICATION BY DOES NOT APPLY ROUTE DAILY.   Ozempic (2 MG/DOSE) 8 MG/3ML Sopn Generic drug: Semaglutide (2 MG/DOSE) Inject 2 mg into the skin every Wednesday.   pantoprazole 40 MG tablet Commonly known as: PROTONIX Take 40 mg by mouth 2 (two) times daily.   potassium chloride SA 20 MEQ tablet Commonly known as: KLOR-CON M Take 2 tablets (40 mEq total) by mouth daily.   progesterone 200 MG capsule Commonly known as: PROMETRIUM Take 200 mg by mouth at bedtime.   promethazine 25 MG tablet Commonly known as: PHENERGAN Take 1 tablet (25 mg total) by mouth every 8 (eight) hours as needed for nausea or vomiting.   senna-docusate 8.6-50 MG tablet Commonly known as: Senokot-S Take 1 tablet by mouth at bedtime as needed for mild constipation or moderate constipation.   tamsulosin 0.4 MG Caps capsule Commonly known as: Flomax Take 1 capsule (0.4 mg total) by mouth daily.   vitamin B-12 1000 MCG tablet Commonly known as: CYANOCOBALAMIN Take 1,000 mcg by mouth daily.        Follow-up Information     [facility] Schedule an appointment as soon as possible for a visit .   Contact information: [address] [place] [address] [phone]        Mancel Bale, PA-C Follow up in 1 week(s).   Specialty: Physician Assistant Contact information: [place] [address] [phone]         Irine Seal, MD Follow up in 2 week(s).   Specialty: Urology Contact information: [place] [address] [phone]                Allergies  Allergen Reactions   Contrast Media [Iodinated Contrast Media] Hives   Darvocet [Propoxyphene N-Acetaminophen] Hives and Itching   Lisinopril Cough   Metformin And Related Other (See Comments)    Chest pain   Morphine And Related Other (See Comments)    headache   Percocet  [Oxycodone-Acetaminophen] Itching    And headache   Robaxin [Methocarbamol] Other (See Comments)    Headache    Wellbutrin [Bupropion Hcl] Other (See Comments)    Hears voices   Zofran [Ondansetron Hcl] Itching    Consultations:    Procedures/Studies: CT Renal Stone Study  Result Date: 10/31/2021 CLINICAL DATA:  Right flank pain EXAM: CT ABDOMEN AND PELVIS WITHOUT CONTRAST TECHNIQUE: Multidetector CT imaging of the abdomen and pelvis was performed following the standard protocol without IV contrast. RADIATION DOSE REDUCTION: This exam was performed according to the departmental dose-optimization program which includes automated exposure control, adjustment of the mA and/or kV according to patient size and/or use of iterative reconstruction technique. COMPARISON:  10/20/2021 FINDINGS: Lower chest: No acute abnormality Hepatobiliary:  No focal liver abnormality is seen. Status post cholecystectomy. No biliary dilatation. Pancreas: No focal abnormality or ductal dilatation. Spleen: No focal abnormality.  Normal size. Adrenals/Urinary Tract: 5 mm nonobstructing stone in the lower pole of the right kidney. No ureteral stones or hydronephrosis. Previously seen right UVJ stone no longer visualized. Adrenal glands and urinary bladder unremarkable. Stomach/Bowel: Diffuse colonic diverticulosis. No active diverticulitis. Stomach and small bowel decompressed, grossly unremarkable. Appendix not visualized. No pericecal inflammation. Vascular/Lymphatic: Aortic atherosclerosis. No evidence of aneurysm or adenopathy. Reproductive: 2.7 cm left ovarian cyst, decreased in size since prior study. Uterus and right adnexa unremarkable. Other: No free fluid or free air. Musculoskeletal: No acute bony abnormality. IMPRESSION: No ureteral stones or hydronephrosis. Right lower pole nephrolithiasis. Diffuse colonic diverticulosis.  No active diverticulitis. No acute findings. Electronically Signed   By: Rolm Baptise M.D.   On:  10/31/2021 20:51   CT Renal Stone Study  Result Date: 10/20/2021 CLINICAL DATA:  Right flank pain and hematuria. EXAM: CT ABDOMEN AND PELVIS WITHOUT CONTRAST TECHNIQUE: Multidetector CT imaging of the abdomen and pelvis was performed following the standard protocol without IV contrast. RADIATION DOSE REDUCTION: This exam was performed according to the departmental dose-optimization program which includes automated exposure control, adjustment of the mA and/or kV according to patient size and/or use of iterative reconstruction technique. COMPARISON:  CT without contrast 11/11/2017. FINDINGS: Lower chest: No acute abnormality. Hepatobiliary: 20 cm length mildly steatotic liver. No focal abnormality is seen without contrast. Old cholecystectomy with no biliary dilatation. Pancreas: There is mild pancreatic atrophy. No ductal dilatation or mass seen without contrast. No adjacent edema. Spleen: Unremarkable without contrast.  No splenomegaly. Adrenals/Urinary Tract: There is no adrenal mass , no focal abnormality in the unenhanced renal cortex. 7 mm nonobstructing caliceal stone noted lower pole right kidney. There are few punctate nonobstructing caliceal stones in the upper pole left kidney. On the right there is mild hydroureteronephrosis due to a 3 mm UVJ stone. There is asymmetric right perinephric edema and trace perinephric fluid. There is no bladder thickening. Stomach/Bowel: No dilatation or wall thickening. Bowel nonrotation is again noted with the large bowel in the midline and left abdomen and pelvis, and small bowel mostly in the right abdomen. An appendix is not seen. There is diffuse colonic diverticulosis without evidence of colitis or diverticulitis. The cecum is in the anterior midline upper pelvis. Vascular/Lymphatic: Aortoiliac atherosclerosis. No enlarged abdominal or pelvic lymph nodes. Reproductive: The uterus is intact. There is a pedunculated 2 cm fibroid off the right anterior fundus. Right  ovary is obscured by unopacified bowel. Left ovary demonstrates a 3.4 cm low-density lesion of 32.6 Hounsfield units which in 2019 measured 2.1 cm and 25.1 Hounsfield units. There are multiple pelvic phleboliths. Other: Small chronic umbilical and inguinal fat hernias. There are no free air, hemorrhage or fluid. Musculoskeletal: There are degenerative changes of the thoracic and lumbar spine, slight discogenic retrolisthesis L3-4. Moderate right greater than left hip DJD. Slight lumbar dextroscoliosis. IMPRESSION: 1. 3 mm right UVJ stone with mild obstructive uropathy. Please correlate clinically for infectious complication. 2. Nonobstructive nephrolithiasis. 3. Mild hepatic steatosis. 4. Diverticulosis without diverticulitis. 5. Enlarging low-density lesion of the left ovary of 3.4 cm, previously 2.1 cm, above the usual density of fluid. Nonemergent follow-up pelvic ultrasound or MRI without and with contrast is recommended to assess the internal architecture and to evaluate for color flow or enhancement. 6. Umbilical and inguinal fat hernias. 7. Aortic atherosclerosis and remaining findings described above. Electronically  Signed   By: Telford Nab M.D.   On: 10/20/2021 21:27   US PELVIC COMPLETE W TRANSVAGINAL AND TORSION R/O  Result Date: 11/01/2021 CLINICAL DATA:  Pelvic pain EXAM: TRANSABDOMINAL AND TRANSVAGINAL ULTRASOUND OF PELVIS DOPPLER ULTRASOUND OF OVARIES TECHNIQUE: Both transabdominal and transvaginal ultrasound examinations of the pelvis were performed. Transabdominal technique was performed for global imaging of the pelvis including uterus, ovaries, adnexal regions, and pelvic cul-de-sac. It was necessary to proceed with endovaginal exam following the transabdominal exam to visualize the ovaries bilaterally. Color and duplex Doppler ultrasound was utilized to evaluate blood flow to the ovaries. COMPARISON:  None. FINDINGS: Uterus Measurements: 7.8 x 5.1 x 5.5 cm = volume: 114 mL. At least 2  heterogeneously hypoechoic intramural masses are seen within the uterus measuring up to 18 mm and 23 mm most in keeping with multiple intrauterine fibroids. Endometrium Thickness: 10 mm.  No focal abnormality visualized. Right ovary Measurements: 3.3 x 1.4 x 1.6 cm = volume: 4 mL. Normal appearance/no adnexal mass. Left ovary Measurements: 4.2 x 3.7 x 3.5 cm = volume: 29 mL. A 2.5 x 2.8 x 2.2 cm simple cyst is seen within the left ovary. Pulsed Doppler evaluation of both ovaries are of technically limited nature and ovarian vascularity is not well assessed on this examination. Other findings No abnormal free fluid. IMPRESSION: Technically limited examination with poor vascular evaluation of the ovaries bilaterally. Morphologic appearance of the ovaries, however, are normal. 2.8 cm left ovarian simple cyst. No followup imaging recommended. Note: This recommendation does not apply to premenarchal patients or to those with increased risk (genetic, family history, elevated tumor markers or other high-risk factors) of ovarian cancer. Reference: Radiology 2019 Nov; 293(2):359-371. Thickened endometrium. In the postmenopausal patient, endometrial sampling is indicated to exclude carcinoma. If results are benign, sonohysterogram should be considered for focal lesion work-up. (Ref: Radiological Reasoning: Algorithmic Workup of Abnormal Vaginal Bleeding with Endovaginal Sonography and Sonohysterography. AJR 2008; 893:T34-28) Multiple small uterine fibroids. Electronically Signed   By: Fidela Salisbury M.D.   On: 11/01/2021 00:28   CT Angio Abd/Pel W and/or Wo Contrast  Result Date: 11/01/2021 CLINICAL DATA:  Evaluate for acute mesenteric ischemia. Abdominal pain since yesterday. A noncontrast CT scan of the abdomen and pelvis demonstrated no cause for pain yesterday. EXAM: CTA ABDOMEN AND PELVIS WITHOUT AND WITH CONTRAST TECHNIQUE: Multidetector CT imaging of the abdomen and pelvis was performed using the standard protocol  during bolus administration of intravenous contrast. Multiplanar reconstructed images and MIPs were obtained and reviewed to evaluate the vascular anatomy. RADIATION DOSE REDUCTION: This exam was performed according to the departmental dose-optimization program which includes automated exposure control, adjustment of the mA and/or kV according to patient size and/or use of iterative reconstruction technique. CONTRAST:  157mL OMNIPAQUE IOHEXOL 350 MG/ML SOLN COMPARISON:  None. FINDINGS: VASCULAR Aorta: No aneurysmal dilatation identified. No dissection. Mild scattered atherosclerotic change. Celiac: Patent without evidence of aneurysm, dissection, vasculitis or significant stenosis. SMA: Patent without evidence of aneurysm, dissection, vasculitis or significant stenosis. Renals: There is 3 right renal arteries and 2 left renal arteries identified. No abnormalities identified within the visualized renal arteries. IMA: Patent without evidence of aneurysm, dissection, vasculitis or significant stenosis. Iliac arteries: Mild atherosclerotic changes seen in the nonaneurysmal iliac arteries. No dissection. Inflow: Patent without evidence of aneurysm, dissection, vasculitis or significant stenosis. Proximal Outflow: Bilateral common femoral and visualized portions of the superficial and profunda femoral arteries are patent without evidence of aneurysm, dissection, vasculitis or significant stenosis. Veins:  No obvious venous abnormality within the limitations of this arterial phase study. Review of the MIP images confirms the above findings. NON-VASCULAR Lower chest: No acute abnormality. Hepatobiliary: No focal liver abnormality is seen. No gallstones, gallbladder wall thickening, or biliary dilatation. Pancreas: Unremarkable. No pancreatic ductal dilatation or surrounding inflammatory changes. Spleen: Normal in size without focal abnormality. Adrenals/Urinary Tract: A 5 mm nonobstructive stone is seen in the lower pole the  right kidney. No other renal stones are identified. No hydronephrosis or perinephric stranding is seen bilaterally. No ureteral stones, obstruction, or abnormalities identified. The bladder is normal. The adrenal glands are normal. Stomach/Bowel: Stomach and small bowel are normal. Scattered colonic diverticulosis is seen without diverticulitis. The colon is otherwise unremarkable. The appendix is normal. Lymphatic: No adenopathy. Reproductive: Fibroid uterus, better assessed on yesterday's ultrasound. The right ovary is unremarkable. A known simple cyst is identified in the left ovary measuring up to 2.7 cm. Other: There is a fat containing ventral hernia which is stable. No free air free fluid. Musculoskeletal: Degenerative changes identified in the right greater than left hip. Degenerative changes seen in the thoracic and lumbar spine. IMPRESSION: 1. No evidence of mesenteric ischemia on this study. 2. Mild calcified atherosclerosis in the abdominal aorta and iliac vessels. 3. 5 mm nonobstructive stone in the lower pole of the right kidney. 4. Scattered colonic diverticulosis without diverticulitis. 5. Fibroid uterus. 6. Known 2.7 cm simple cyst in the left ovary. No follow-up imaging recommended. Note: This recommendation does not apply to premenarchal patients and to those with increased risk (genetic, family history, elevated tumor markers or other high-risk factors) of ovarian cancer. Reference: JACR 2020 Feb; 17(2):248-254 7. Fat containing ventral hernia. 8. Degenerative changes in the right greater than left hip and lumbar spine. Electronically Signed   By: Dorise Bullion III M.D.   On: 11/01/2021 11:17      Subjective:   Discharge Exam: Vitals:   11/02/21 0429 11/02/21 0921  BP: 122/64 (!) 157/76  Pulse: 76 71  Resp: 18 17  Temp: 98.6 F (37 C) 98.2 F (36.8 C)  SpO2: 98% 98%   Vitals:   11/01/21 2020 11/01/21 2128 11/02/21 0429 11/02/21 0921  BP: (!) 143/68  122/64 (!) 157/76   Pulse: 93 87 76 71  Resp: $Remo'18 20 18 17  'MdcdC$ Temp: 98.4 F (36.9 C)  98.6 F (37 C) 98.2 F (36.8 C)  TempSrc: Oral  Oral   SpO2: 96% 97% 98% 98%  Weight:      Height:        General: Pt is alert, awake, not in acute distress Cardiovascular: RRR, S1/S2 +, no rubs, no gallops Respiratory: CTA bilaterally, no wheezing, no rhonchi Abdominal: Soft, NT, ND, bowel sounds + Extremities: no edema, no cyanosis    The results of significant diagnostics from this hospitalization (including imaging, microbiology, ancillary and laboratory) are listed below for reference.     Microbiology: Recent Results (from the past 240 hour(s))  Resp Panel by RT-PCR (Flu A&B, Covid) Nasopharyngeal Swab     Status: None   Collection Time: 11/01/21 11:16 AM   Specimen: Nasopharyngeal Swab; Nasopharyngeal(NP) swabs in vial transport medium  Result Value Ref Range Status   SARS Coronavirus 2 by RT PCR NEGATIVE NEGATIVE Final    Comment: (NOTE) SARS-CoV-2 target nucleic acids are NOT DETECTED.  The SARS-CoV-2 RNA is generally detectable in upper respiratory specimens during the acute phase of infection. The lowest concentration of SARS-CoV-2 viral copies this assay can detect  is 138 copies/mL. A negative result does not preclude SARS-Cov-2 infection and should not be used as the sole basis for treatment or other patient management decisions. A negative result may occur with  improper specimen collection/handling, submission of specimen other than nasopharyngeal swab, presence of viral mutation(s) within the areas targeted by this assay, and inadequate number of viral copies(<138 copies/mL). A negative result must be combined with clinical observations, patient history, and epidemiological information. The expected result is Negative.  Fact Sheet for Patients:  EntrepreneurPulse.com.au  Fact Sheet for Healthcare Providers:  IncredibleEmployment.be  This test is no t  yet approved or cleared by the [place] FDA and  has been authorized for detection and/or diagnosis of SARS-CoV-2 by FDA under an Emergency Use Authorization (EUA). This EUA will remain  in effect (meaning this test can be used) for the duration of the COVID-19 declaration under Section 564(b)(1) of the Act, 21 U.S.C.section 360bbb-3(b)(1), unless the authorization is terminated  or revoked sooner.       Influenza A by PCR NEGATIVE NEGATIVE Final   Influenza B by PCR NEGATIVE NEGATIVE Final    Comment: (NOTE) The Xpert Xpress SARS-CoV-2/FLU/RSV plus assay is intended as an aid in the diagnosis of influenza from Nasopharyngeal swab specimens and should not be used as a sole basis for treatment. Nasal washings and aspirates are unacceptable for Xpert Xpress SARS-CoV-2/FLU/RSV testing.  Fact Sheet for Patients: EntrepreneurPulse.com.au  Fact Sheet for Healthcare Providers: IncredibleEmployment.be  This test is not yet approved or cleared by the [place] FDA and has been authorized for detection and/or diagnosis of SARS-CoV-2 by FDA under an Emergency Use Authorization (EUA). This EUA will remain in effect (meaning this test can be used) for the duration of the COVID-19 declaration under Section 564(b)(1) of the Act, 21 U.S.C. section 360bbb-3(b)(1), unless the authorization is terminated or revoked.  Performed at [facility], [place] [address], [place], [place] [address]      Labs: BNP (last 3 results) No results for input(s): BNP in the last 8760 hours. Basic Metabolic Panel: Recent Labs  Lab 10/31/21 2056 11/02/21 0354  NA 139 139  K 3.7 3.2*  CL 105 109  CO2 26 20*  GLUCOSE 90 74  BUN 22* 16  CREATININE 0.78 0.73  CALCIUM 9.2 8.1*   Liver Function Tests: Recent Labs  Lab 10/31/21 2056  AST 17  ALT 27  ALKPHOS 76  BILITOT 0.4  PROT 6.8  ALBUMIN 3.9   Recent Labs  Lab 10/31/21 2056  LIPASE 56*   No  results for input(s): AMMONIA in the last 168 hours. CBC: Recent Labs  Lab 10/31/21 2056 11/01/21 1300 11/02/21 0354  WBC 10.9* 9.2 10.2  NEUTROABS 6.6  --   --   HGB 14.2 14.6 12.5  HCT 41.1 42.3 38.0  MCV 90.7 92.0 93.8  PLT 319 320 304   Cardiac Enzymes: No results for input(s): CKTOTAL, CKMB, CKMBINDEX, TROPONINI in the last 168 hours. BNP: Invalid input(s): POCBNP CBG: Recent Labs  Lab 11/01/21 1627 11/01/21 2033 11/02/21 0729  GLUCAP 77 118* 99   D-Dimer No results for input(s): DDIMER in the last 72 hours. Hgb A1c Recent Labs    11/01/21 1300  HGBA1C 6.4*   Lipid Profile No results for input(s): CHOL, HDL, LDLCALC, TRIG, CHOLHDL, LDLDIRECT in the last 72 hours. Thyroid function studies No results for input(s): TSH, T4TOTAL, T3FREE, THYROIDAB in the last 72 hours.  Invalid input(s): FREET3 Anemia work up No results  for input(s): VITAMINB12, FOLATE, FERRITIN, TIBC, IRON, RETICCTPCT in the last 72 hours. Urinalysis    Component Value Date/Time   COLORURINE YELLOW 11/01/2021 0047   APPEARANCEUR TURBID (A) 11/01/2021 0047   LABSPEC >1.030 (H) 11/01/2021 0047   PHURINE 5.5 11/01/2021 0047   GLUCOSEU NEGATIVE 11/01/2021 0047   HGBUR TRACE (A) 11/01/2021 0047   BILIRUBINUR NEGATIVE 11/01/2021 0047   BILIRUBINUR small 12/08/2014 1305   KETONESUR NEGATIVE 11/01/2021 0047   PROTEINUR NEGATIVE 11/01/2021 0047   UROBILINOGEN 0.2 12/08/2014 1305   UROBILINOGEN 0.2 04/19/2013 0932   NITRITE NEGATIVE 11/01/2021 0047   LEUKOCYTESUR NEGATIVE 11/01/2021 0047   Sepsis Labs Invalid input(s): PROCALCITONIN,  WBC,  LACTICIDVEN Microbiology Recent Results (from the past 240 hour(s))  Resp Panel by RT-PCR (Flu A&B, Covid) Nasopharyngeal Swab     Status: None   Collection Time: 11/01/21 11:16 AM   Specimen: Nasopharyngeal Swab; Nasopharyngeal(NP) swabs in vial transport medium  Result Value Ref Range Status   SARS Coronavirus 2 by RT PCR NEGATIVE NEGATIVE Final     Comment: (NOTE) SARS-CoV-2 target nucleic acids are NOT DETECTED.  The SARS-CoV-2 RNA is generally detectable in upper respiratory specimens during the acute phase of infection. The lowest concentration of SARS-CoV-2 viral copies this assay can detect is 138 copies/mL. A negative result does not preclude SARS-Cov-2 infection and should not be used as the sole basis for treatment or other patient management decisions. A negative result may occur with  improper specimen collection/handling, submission of specimen other than nasopharyngeal swab, presence of viral mutation(s) within the areas targeted by this assay, and inadequate number of viral copies(<138 copies/mL). A negative result must be combined with clinical observations, patient history, and epidemiological information. The expected result is Negative.  Fact Sheet for Patients:  EntrepreneurPulse.com.au  Fact Sheet for Healthcare Providers:  IncredibleEmployment.be  This test is no t yet approved or cleared by the [place] FDA and  has been authorized for detection and/or diagnosis of SARS-CoV-2 by FDA under an Emergency Use Authorization (EUA). This EUA will remain  in effect (meaning this test can be used) for the duration of the COVID-19 declaration under Section 564(b)(1) of the Act, 21 U.S.C.section 360bbb-3(b)(1), unless the authorization is terminated  or revoked sooner.       Influenza A by PCR NEGATIVE NEGATIVE Final   Influenza B by PCR NEGATIVE NEGATIVE Final    Comment: (NOTE) The Xpert Xpress SARS-CoV-2/FLU/RSV plus assay is intended as an aid in the diagnosis of influenza from Nasopharyngeal swab specimens and should not be used as a sole basis for treatment. Nasal washings and aspirates are unacceptable for Xpert Xpress SARS-CoV-2/FLU/RSV testing.  Fact Sheet for Patients: EntrepreneurPulse.com.au  Fact Sheet for Healthcare  Providers: IncredibleEmployment.be  This test is not yet approved or cleared by the [place] FDA and has been authorized for detection and/or diagnosis of SARS-CoV-2 by FDA under an Emergency Use Authorization (EUA). This EUA will remain in effect (meaning this test can be used) for the duration of the COVID-19 declaration under Section 564(b)(1) of the Act, 21 U.S.C. section 360bbb-3(b)(1), unless the authorization is terminated or revoked.  Performed at [facility], [place] [address], [place], [place] [address]      Time coordinating discharge: Over 30 minutes  SIGNED:   Nolberto Hanlon, MD  [facility] 11/02/2021, 10:41 AM Pager   If 7PM-7AM, please contact night-coverage [email or web address] Password [email or web address]

## 2021-11-02 NOTE — Progress Notes (Addendum)
°  Transition of Care [facility]) Screening Note   Patient Details  Name: [name] Date of Birth: Nov 11, 1965   [facility]) CM/SW Contact:    Tom-Johnson, Renea Ee, RN Phone Number: 11/02/2021, 12:36 PM  Patient is from home with husband. Admitted for abd pain 2/2 Nephrolithiasis, had uretral stent 10 days ago. Independent with care and drive self prior to admission. PCP is Weber, Damaris Hippo, PA-C and uses [facility] in [place]. Denies any needs. Transition of Care Department [facility]) has reviewed patient and no recommendations noted at this time. Patient is scheduled for discharge today and husband to transport at discharge. No further TOC needs noted.

## 2021-11-03 NOTE — Discharge Summary (Signed)
[name] OHF:[id number] DOB: [birth year] DOA: 10/31/2021  PCP: Jamie Bale, PA-C  Admit date: 10/31/2021 Discharge date: 11/02/2021    Recommendations for Outpatient Follow-up:  Follow up with PCP in 1 week Please obtain BMP/CBC in one week Please follow up with urology in 2 weeks     Discharge Condition:Stable CODE STATUS:full  Diet recommendation: Heart Healthy  Brief/Interim Summary: Per MCE:[name] is a 56 y.o. female with medical history significant of hypertension, DM type II, nephrolithiasis, anxiety, morbid obesity, and OSA presents with complaints of right-sided flank pain that started yesterday.  She describes it as a sharp stabbing pain that radiates from her right side of back and wraps around to her lower abdomen.  She has been taking Flomax and passed previous kidney stone from when she came to the emergency department on 1/23.  At that time CT scan showed a UPJ stone present with mild obstructive features on the right.  After getting home patient reports that she did pass the stone and noted a little blood in her urine as well as a little discomfort while peeing.     Patient was noted to be afebrile with blood pressures elevated up to 171/73, and all other vital signs maintained.  Labs from yesterday 2/4 significant for WBC 10.9, BUN 22, creatinine 0.79, lipase 56, and lactic acid 1.3.  CT scan of the abdomen pelvis was obtained with a 5 mm nonobstructing stone of the lower pole of the right kidney and the previous UVJ stone no longer visualized urinalysis noted trace hemoglobin, elevated specific gravity greater than 1.03, 21-50 squamous epithelial cells, and many bacteria thought to be likely a contaminant.  She had been sent to [facility] ED for pelvic ultrasound to evaluate her ovaries, but it was a poor quality.  OB/GYN has been consulted but did not feel like symptoms were related to ovarian torsion given available imaging.  She received multiple rounds of pain  medication and continued to complain of intractable nausea.  She had received pretreatment with hydrocortisone to obtain CTA of the abdomen pelvis due to a contrast allergy.  CTA was obtained to rule out other possible causes including mesenteric ischemia, but no acute findings were appreciated.  After CT patient complained of itching and feeling like her throat was closing.  Patient was noted to be protecting her airway and O2 saturations maintained, but patient was given dose of epinephrine.  Due to continued pain [facility] called to admit.     Her pain was improving with pain control. I spoke to urology via phone Dr. Jeffie Pollock who reviewed the CT scan and did not feel her pain is coming from a renal stone since it is nonobstructing.  Recommended patient follow-up with urology as outpatient.    Abdominal pain suspected secondary to nephrolithiasis- (present on admission) Patient presented with complaints of severe right-sided flank pain that appears to similar to pain from 1/24. Patient reports that she did pass a kidney stone thereafter.    Lipase levels have been mildly elevated, but symptoms are not consistent with pancreatitis.   CT scan noted 5 mm right-sided kidney stone present without obstruction.  She also was evaluated with a vaginal ultrasound in her case was discussed with OB/GYN who felt symptoms less likely ovarian torsion based off imaging and age.  CTA obtained due to the severity of patient's symptoms, but no signs of mesenteric ischemia. Neurology did not feel pain was from nonobstructive stone and recommended patient follow-up as outpatient when discussed  over the phone  Patient to follow-up with primary care follow-up evaluation of her pain    leukocytosis- (present on admission) WBC was elevated to 10.9 on 2/4.   Likely reative Wbc normal now     DM (diabetes mellitus), type 2 ([name]) Continue home meds   Continue home meds   Anxiety- (present on admission) Continue home meds    OSA-(present on admission   Discharge Diagnoses:  Principal Problem:   Abdominal pain suspected secondary to nephrolithiasis Active Problems:   HTN (hypertension)   DM (diabetes mellitus), type 2 (HCC)   Obesity, Class III, BMI 40-49.9 (morbid obesity) (HCC)   Sleep apnea with use of continuous positive airway pressure (CPAP)   Leukocytosis   Anxiety   Nephrolithiasis    Discharge Instructions  Discharge Instructions     Call MD for:  temperature >100.4   Complete by: As directed    Diet - low sodium heart healthy   Complete by: As directed    Discharge instructions   Complete by: As directed    Make sure to hydrate throughout the day with water   Increase activity slowly   Complete by: As directed       Allergies as of 11/02/2021       Reactions   Contrast Media [iodinated Contrast Media] Hives   Darvocet [propoxyphene N-acetaminophen] Hives, Itching   Lisinopril Cough   Metformin And Related Other (See Comments)   Chest pain   Morphine And Related Other (See Comments)   headache   Percocet [oxycodone-acetaminophen] Itching   And headache   Robaxin [methocarbamol] Other (See Comments)   Headache   Wellbutrin [bupropion Hcl] Other (See Comments)   Hears voices   Zofran [ondansetron Hcl] Itching        Medication List     STOP taking these medications    omeprazole 20 MG tablet Commonly known as: PRILOSEC OTC       TAKE these medications    albuterol 108 (90 Base) MCG/ACT inhaler Commonly known as: VENTOLIN HFA Inhale 2 puffs into the lungs every 6 (six) hours as needed for wheezing or shortness of breath.   atorvastatin 40 MG tablet Commonly known as: LIPITOR Take 1 tablet (40 mg total) by mouth daily. Notes to patient: 11/03/2021   BIOTIN PO Take 1 capsule by mouth daily. Notes to patient: 11/03/2021   blood glucose meter kit and supplies Kit by Does not apply route in the morning and at bedtime. Notes to patient: Continue home schedule    diazepam 2 MG tablet Commonly known as: Valium Take 0.5-1 tablets (1-2 mg total) every 12 (twelve) hours as needed by mouth for anxiety or muscle spasms.   diclofenac Sodium 1 % Gel Commonly known as: Voltaren Apply 2 g topically 4 (four) times daily as needed. What changed: reasons to take this Notes to patient: 11/07/2021   ergocalciferol 1.25 MG (50000 UT) capsule Commonly known as: VITAMIN D2 Take 50,000 Units by mouth every Saturday.   ferrous sulfate 325 (65 FE) MG tablet Take 325 mg by mouth daily with breakfast. Notes to patient: 11/03/2021   FLUoxetine 20 MG capsule Commonly known as: PROzac Take 1 capsule (20 mg total) by mouth 2 (two) times daily. Notes to patient: 11/02/2021   FreeStyle Libre 14 Day Sensor Misc 1 application by Does not apply route every 14 (fourteen) days. Notes to patient: Continue home schedule    furosemide 20 MG tablet Commonly known as: Lasix Take 2 tablets (40 mg  total) by mouth daily. Notes to patient: 11/02/2021   gemfibrozil 600 MG tablet Commonly known as: LOPID Take 1 tablet (600 mg total) by mouth 2 (two) times daily before a meal. Notes to patient: 11/02/2021   HYDROcodone-acetaminophen 5-325 MG tablet Commonly known as: NORCO/VICODIN Take 1 tablet by mouth every 6 (six) hours as needed for up to 2 days for moderate pain.   Insulin Glargine 300 UNIT/ML Sopn Commonly known as: Toujeo SoloStar Inject 50 Units into the skin 2 (two) times daily. Notes to patient: 11/02/2021   metoprolol tartrate 50 MG tablet Commonly known as: LOPRESSOR Take 1 tablet (50 mg total) by mouth 2 (two) times daily. Notes to patient: 11/02/2021   naloxone 4 MG/0.1ML Liqd nasal spray kit Commonly known as: NARCAN Place 0.4 mg into the nose as needed (accidental overdose).   NovoTwist 32G X 5 MM Misc Generic drug: Insulin Pen Needle USE 1 APPLICATION BY DOES NOT APPLY ROUTE DAILY. Notes to patient: Continue home scedule   Ozempic (2 MG/DOSE) 8 MG/3ML  Sopn Generic drug: Semaglutide (2 MG/DOSE) Inject 2 mg into the skin every Wednesday. Notes to patient: 11/04/2021   pantoprazole 40 MG tablet Commonly known as: PROTONIX Take 40 mg by mouth 2 (two) times daily. Notes to patient: 11/02/2021   potassium chloride SA 20 MEQ tablet Commonly known as: KLOR-CON M Take 2 tablets (40 mEq total) by mouth daily. Notes to patient: 11/03/2021   progesterone 200 MG capsule Commonly known as: PROMETRIUM Take 200 mg by mouth at bedtime. Notes to patient: 11/02/2021   promethazine 25 MG tablet Commonly known as: PHENERGAN Take 1 tablet (25 mg total) by mouth every 8 (eight) hours as needed for nausea or vomiting.   senna-docusate 8.6-50 MG tablet Commonly known as: Senokot-S Take 1 tablet by mouth at bedtime as needed for mild constipation or moderate constipation.   tamsulosin 0.4 MG Caps capsule Commonly known as: Flomax Take 1 capsule (0.4 mg total) by mouth daily.   vitamin B-12 1000 MCG tablet Commonly known as: CYANOCOBALAMIN Take 1,000 mcg by mouth daily. Notes to patient: 11/02/2021        Follow-up Information     [facility]. Schedule an appointment as soon as possible for a visit .   Contact information: [address] [place] [address] [phone]        Jamie Bale, PA-C Follow up in 1 week(s).   Specialty: Physician Assistant Contact information: [place] [address] [phone]         Irine Seal, MD Follow up in 2 week(s).   Specialty: Urology Contact information: [place] [address] [phone]                Allergies  Allergen Reactions   Contrast Media [Iodinated Contrast Media] Hives   Darvocet [Propoxyphene N-Acetaminophen] Hives and Itching   Lisinopril Cough   Metformin And Related Other (See Comments)    Chest pain   Morphine And Related Other (See Comments)    headache   Percocet  [Oxycodone-Acetaminophen] Itching    And headache   Robaxin [Methocarbamol] Other (See Comments)    Headache    Wellbutrin [Bupropion Hcl] Other (See Comments)    Hears voices   Zofran [Ondansetron Hcl] Itching    Consultations:    Procedures/Studies: CT Renal Stone Study  Result Date: 10/31/2021 CLINICAL DATA:  Right flank pain EXAM: CT ABDOMEN AND PELVIS WITHOUT  CONTRAST TECHNIQUE: Multidetector CT imaging of the abdomen and pelvis was performed following the standard protocol without IV contrast. RADIATION DOSE REDUCTION: This exam was performed according to the departmental dose-optimization program which includes automated exposure control, adjustment of the mA and/or kV according to patient size and/or use of iterative reconstruction technique. COMPARISON:  10/20/2021 FINDINGS: Lower chest: No acute abnormality Hepatobiliary: No focal liver abnormality is seen. Status post cholecystectomy. No biliary dilatation. Pancreas: No focal abnormality or ductal dilatation. Spleen: No focal abnormality.  Normal size. Adrenals/Urinary Tract: 5 mm nonobstructing stone in the lower pole of the right kidney. No ureteral stones or hydronephrosis. Previously seen right UVJ stone no longer visualized. Adrenal glands and urinary bladder unremarkable. Stomach/Bowel: Diffuse colonic diverticulosis. No active diverticulitis. Stomach and small bowel decompressed, grossly unremarkable. Appendix not visualized. No pericecal inflammation. Vascular/Lymphatic: Aortic atherosclerosis. No evidence of aneurysm or adenopathy. Reproductive: 2.7 cm left ovarian cyst, decreased in size since prior study. Uterus and right adnexa unremarkable. Other: No free fluid or free air. Musculoskeletal: No acute bony abnormality. IMPRESSION: No ureteral stones or hydronephrosis. Right lower pole nephrolithiasis. Diffuse colonic diverticulosis.  No active diverticulitis. No acute findings. Electronically Signed   By: Rolm Baptise M.D.   On:  10/31/2021 20:51   CT Renal Stone Study  Result Date: 10/20/2021 CLINICAL DATA:  Right flank pain and hematuria. EXAM: CT ABDOMEN AND PELVIS WITHOUT CONTRAST TECHNIQUE: Multidetector CT imaging of the abdomen and pelvis was performed following the standard protocol without IV contrast. RADIATION DOSE REDUCTION: This exam was performed according to the departmental dose-optimization program which includes automated exposure control, adjustment of the mA and/or kV according to patient size and/or use of iterative reconstruction technique. COMPARISON:  CT without contrast 11/11/2017. FINDINGS: Lower chest: No acute abnormality. Hepatobiliary: 20 cm length mildly steatotic liver. No focal abnormality is seen without contrast. Old cholecystectomy with no biliary dilatation. Pancreas: There is mild pancreatic atrophy. No ductal dilatation or mass seen without contrast. No adjacent edema. Spleen: Unremarkable without contrast.  No splenomegaly. Adrenals/Urinary Tract: There is no adrenal mass , no focal abnormality in the unenhanced renal cortex. 7 mm nonobstructing caliceal stone noted lower pole right kidney. There are few punctate nonobstructing caliceal stones in the upper pole left kidney. On the right there is mild hydroureteronephrosis due to a 3 mm UVJ stone. There is asymmetric right perinephric edema and trace perinephric fluid. There is no bladder thickening. Stomach/Bowel: No dilatation or wall thickening. Bowel nonrotation is again noted with the large bowel in the midline and left abdomen and pelvis, and small bowel mostly in the right abdomen. An appendix is not seen. There is diffuse colonic diverticulosis without evidence of colitis or diverticulitis. The cecum is in the anterior midline upper pelvis. Vascular/Lymphatic: Aortoiliac atherosclerosis. No enlarged abdominal or pelvic lymph nodes. Reproductive: The uterus is intact. There is a pedunculated 2 cm fibroid off the right anterior fundus. Right  ovary is obscured by unopacified bowel. Left ovary demonstrates a 3.4 cm low-density lesion of 32.6 Hounsfield units which in 2019 measured 2.1 cm and 25.1 Hounsfield units. There are multiple pelvic phleboliths. Other: Small chronic umbilical and inguinal fat hernias. There are no free air, hemorrhage or fluid. Musculoskeletal: There are degenerative changes of the thoracic and lumbar spine, slight discogenic retrolisthesis L3-4. Moderate right greater than left hip DJD. Slight lumbar dextroscoliosis. IMPRESSION: 1. 3 mm right UVJ stone with mild obstructive uropathy. Please correlate clinically for infectious complication. 2. Nonobstructive nephrolithiasis. 3. Mild hepatic steatosis. 4.  Diverticulosis without diverticulitis. 5. Enlarging low-density lesion of the left ovary of 3.4 cm, previously 2.1 cm, above the usual density of fluid. Nonemergent follow-up pelvic ultrasound or MRI without and with contrast is recommended to assess the internal architecture and to evaluate for color flow or enhancement. 6. Umbilical and inguinal fat hernias. 7. Aortic atherosclerosis and remaining findings described above. Electronically Signed   By: Telford Nab M.D.   On: 10/20/2021 21:27   US PELVIC COMPLETE W TRANSVAGINAL AND TORSION R/O  Result Date: 11/01/2021 CLINICAL DATA:  Pelvic pain EXAM: TRANSABDOMINAL AND TRANSVAGINAL ULTRASOUND OF PELVIS DOPPLER ULTRASOUND OF OVARIES TECHNIQUE: Both transabdominal and transvaginal ultrasound examinations of the pelvis were performed. Transabdominal technique was performed for global imaging of the pelvis including uterus, ovaries, adnexal regions, and pelvic cul-de-sac. It was necessary to proceed with endovaginal exam following the transabdominal exam to visualize the ovaries bilaterally. Color and duplex Doppler ultrasound was utilized to evaluate blood flow to the ovaries. COMPARISON:  None. FINDINGS: Uterus Measurements: 7.8 x 5.1 x 5.5 cm = volume: 114 mL. At least 2  heterogeneously hypoechoic intramural masses are seen within the uterus measuring up to 18 mm and 23 mm most in keeping with multiple intrauterine fibroids. Endometrium Thickness: 10 mm.  No focal abnormality visualized. Right ovary Measurements: 3.3 x 1.4 x 1.6 cm = volume: 4 mL. Normal appearance/no adnexal mass. Left ovary Measurements: 4.2 x 3.7 x 3.5 cm = volume: 29 mL. A 2.5 x 2.8 x 2.2 cm simple cyst is seen within the left ovary. Pulsed Doppler evaluation of both ovaries are of technically limited nature and ovarian vascularity is not well assessed on this examination. Other findings No abnormal free fluid. IMPRESSION: Technically limited examination with poor vascular evaluation of the ovaries bilaterally. Morphologic appearance of the ovaries, however, are normal. 2.8 cm left ovarian simple cyst. No followup imaging recommended. Note: This recommendation does not apply to premenarchal patients or to those with increased risk (genetic, family history, elevated tumor markers or other high-risk factors) of ovarian cancer. Reference: Radiology 2019 Nov; 293(2):359-371. Thickened endometrium. In the postmenopausal patient, endometrial sampling is indicated to exclude carcinoma. If results are benign, sonohysterogram should be considered for focal lesion work-up. (Ref: Radiological Reasoning: Algorithmic Workup of Abnormal Vaginal Bleeding with Endovaginal Sonography and Sonohysterography. AJR 2008; 025:K27-06) Multiple small uterine fibroids. Electronically Signed   By: Fidela Salisbury M.D.   On: 11/01/2021 00:28   CT Angio Abd/Pel W and/or Wo Contrast  Result Date: 11/01/2021 CLINICAL DATA:  Evaluate for acute mesenteric ischemia. Abdominal pain since yesterday. A noncontrast CT scan of the abdomen and pelvis demonstrated no cause for pain yesterday. EXAM: CTA ABDOMEN AND PELVIS WITHOUT AND WITH CONTRAST TECHNIQUE: Multidetector CT imaging of the abdomen and pelvis was performed using the standard protocol  during bolus administration of intravenous contrast. Multiplanar reconstructed images and MIPs were obtained and reviewed to evaluate the vascular anatomy. RADIATION DOSE REDUCTION: This exam was performed according to the departmental dose-optimization program which includes automated exposure control, adjustment of the mA and/or kV according to patient size and/or use of iterative reconstruction technique. CONTRAST:  120mL OMNIPAQUE IOHEXOL 350 MG/ML SOLN COMPARISON:  None. FINDINGS: VASCULAR Aorta: No aneurysmal dilatation identified. No dissection. Mild scattered atherosclerotic change. Celiac: Patent without evidence of aneurysm, dissection, vasculitis or significant stenosis. SMA: Patent without evidence of aneurysm, dissection, vasculitis or significant stenosis. Renals: There is 3 right renal arteries and 2 left renal arteries identified. No abnormalities identified within the visualized renal  arteries. IMA: Patent without evidence of aneurysm, dissection, vasculitis or significant stenosis. Iliac arteries: Mild atherosclerotic changes seen in the nonaneurysmal iliac arteries. No dissection. Inflow: Patent without evidence of aneurysm, dissection, vasculitis or significant stenosis. Proximal Outflow: Bilateral common femoral and visualized portions of the superficial and profunda femoral arteries are patent without evidence of aneurysm, dissection, vasculitis or significant stenosis. Veins: No obvious venous abnormality within the limitations of this arterial phase study. Review of the MIP images confirms the above findings. NON-VASCULAR Lower chest: No acute abnormality. Hepatobiliary: No focal liver abnormality is seen. No gallstones, gallbladder wall thickening, or biliary dilatation. Pancreas: Unremarkable. No pancreatic ductal dilatation or surrounding inflammatory changes. Spleen: Normal in size without focal abnormality. Adrenals/Urinary Tract: A 5 mm nonobstructive stone is seen in the lower pole the  right kidney. No other renal stones are identified. No hydronephrosis or perinephric stranding is seen bilaterally. No ureteral stones, obstruction, or abnormalities identified. The bladder is normal. The adrenal glands are normal. Stomach/Bowel: Stomach and small bowel are normal. Scattered colonic diverticulosis is seen without diverticulitis. The colon is otherwise unremarkable. The appendix is normal. Lymphatic: No adenopathy. Reproductive: Fibroid uterus, better assessed on yesterday's ultrasound. The right ovary is unremarkable. A known simple cyst is identified in the left ovary measuring up to 2.7 cm. Other: There is a fat containing ventral hernia which is stable. No free air free fluid. Musculoskeletal: Degenerative changes identified in the right greater than left hip. Degenerative changes seen in the thoracic and lumbar spine. IMPRESSION: 1. No evidence of mesenteric ischemia on this study. 2. Mild calcified atherosclerosis in the abdominal aorta and iliac vessels. 3. 5 mm nonobstructive stone in the lower pole of the right kidney. 4. Scattered colonic diverticulosis without diverticulitis. 5. Fibroid uterus. 6. Known 2.7 cm simple cyst in the left ovary. No follow-up imaging recommended. Note: This recommendation does not apply to premenarchal patients and to those with increased risk (genetic, family history, elevated tumor markers or other high-risk factors) of ovarian cancer. Reference: JACR 2020 Feb; 17(2):248-254 7. Fat containing ventral hernia. 8. Degenerative changes in the right greater than left hip and lumbar spine. Electronically Signed   By: Dorise Bullion III M.D.   On: 11/01/2021 11:17      Subjective: No new complaints.  Denies shortness of breath, dizziness or lightheadedness.  Mild improvement of her pain  Discharge Exam: Vitals:   11/02/21 0429 11/02/21 0921  BP: 122/64 (!) 157/76  Pulse: 76 71  Resp: 18 17  Temp: 98.6 F (37 C) 98.2 F (36.8 C)  SpO2: 98% 98%    Vitals:   11/01/21 2020 11/01/21 2128 11/02/21 0429 11/02/21 0921  BP: (!) 143/68  122/64 (!) 157/76  Pulse: 93 87 76 71  Resp: $Remo'18 20 18 17  'QsgEN$ Temp: 98.4 F (36.9 C)  98.6 F (37 C) 98.2 F (36.8 C)  TempSrc: Oral  Oral   SpO2: 96% 97% 98% 98%  Weight:      Height:        General: Pt is alert, awake, not in acute distress Cardiovascular: RRR, S1/S2 +, no rubs, no gallops Respiratory: CTA bilaterally, no wheezing, no rhonchi Abdominal: Soft, NT, ND, bowel sounds + Extremities: no edema, no cyanosis    The results of significant diagnostics from this hospitalization (including imaging, microbiology, ancillary and laboratory) are listed below for reference.     Microbiology: Recent Results (from the past 240 hour(s))  Resp Panel by RT-PCR (Flu A&B, Covid) Nasopharyngeal Swab  Status: None   Collection Time: 11/01/21 11:16 AM   Specimen: Nasopharyngeal Swab; Nasopharyngeal(NP) swabs in vial transport medium  Result Value Ref Range Status   SARS Coronavirus 2 by RT PCR NEGATIVE NEGATIVE Final    Comment: (NOTE) SARS-CoV-2 target nucleic acids are NOT DETECTED.  The SARS-CoV-2 RNA is generally detectable in upper respiratory specimens during the acute phase of infection. The lowest concentration of SARS-CoV-2 viral copies this assay can detect is 138 copies/mL. A negative result does not preclude SARS-Cov-2 infection and should not be used as the sole basis for treatment or other patient management decisions. A negative result may occur with  improper specimen collection/handling, submission of specimen other than nasopharyngeal swab, presence of viral mutation(s) within the areas targeted by this assay, and inadequate number of viral copies(<138 copies/mL). A negative result must be combined with clinical observations, patient history, and epidemiological information. The expected result is Negative.  Fact Sheet for Patients:   EntrepreneurPulse.com.au  Fact Sheet for Healthcare Providers:  IncredibleEmployment.be  This test is no t yet approved or cleared by the [place] FDA and  has been authorized for detection and/or diagnosis of SARS-CoV-2 by FDA under an Emergency Use Authorization (EUA). This EUA will remain  in effect (meaning this test can be used) for the duration of the COVID-19 declaration under Section 564(b)(1) of the Act, 21 U.S.C.section 360bbb-3(b)(1), unless the authorization is terminated  or revoked sooner.       Influenza A by PCR NEGATIVE NEGATIVE Final   Influenza B by PCR NEGATIVE NEGATIVE Final    Comment: (NOTE) The Xpert Xpress SARS-CoV-2/FLU/RSV plus assay is intended as an aid in the diagnosis of influenza from Nasopharyngeal swab specimens and should not be used as a sole basis for treatment. Nasal washings and aspirates are unacceptable for Xpert Xpress SARS-CoV-2/FLU/RSV testing.  Fact Sheet for Patients: EntrepreneurPulse.com.au  Fact Sheet for Healthcare Providers: [employer]redible[employer]ployment[employer]  This test is not yet approved or cleared by the [place] FDA and has been authorized for detection and/or diagnosis of SARS-CoV-2 by FDA under an Emergency Use Authorization (EUA). This EUA will remain in effect (meaning this test can be used) for the duration of the COVID-19 declaration under Section 564(b)(1) of the Act, 21 U.S.C. section 360bbb-3(b)(1), unless the authorization is terminated or revoked.  Performed at [facility], [place] [address] [place], [place] [address]      Labs: BNP (last 3 results) No results for input(s): BNP in the last 8760 hours. Basic Metabolic Panel: Recent Labs  Lab 10/31/21 2056 11/02/21 0354  NA 139 139  K 3.7 3.2*  CL 105 109  CO2 26 20*  GLUCOSE 90 74  BUN 22* 16  CREATININE 0.78 0.73  CALCIUM 9.2 8.1*   Liver Function Tests: Recent Labs   Lab 10/31/21 2056  AST 17  ALT 27  ALKPHOS 76  BILITOT 0.4  PROT 6.8  ALBUMIN 3.9   Recent Labs  Lab 10/31/21 2056  LIPASE 56*   No results for input(s): AMMONIA in the last 168 hours. CBC: Recent Labs  Lab 10/31/21 2056 11/01/21 1300 11/02/21 0354  WBC 10.9* 9.2 10.2  NEUTROABS 6.6  --   --   HGB 14.2 14.6 12.5  HCT 41.1 42.3 38.0  MCV 90.7 92.0 93.8  PLT 319 320 304   Cardiac Enzymes: No results for input(s): CKTOTAL, CKMB, CKMBINDEX, TROPONINI in the last 168 hours. BNP: Invalid input(s): POCBNP CBG: Recent Labs  Lab 11/01/21 1627 11/01/21  2033 11/02/21 0729 11/02/21 1148  GLUCAP 77 118* 99 85   D-Dimer No results for input(s): DDIMER in the last 72 hours. Hgb A1c Recent Labs    11/01/21 1300  HGBA1C 6.4*   Lipid Profile No results for input(s): CHOL, HDL, LDLCALC, TRIG, CHOLHDL, LDLDIRECT in the last 72 hours. Thyroid function studies No results for input(s): TSH, T4TOTAL, T3FREE, THYROIDAB in the last 72 hours.  Invalid input(s): FREET3 Anemia work up No results for input(s): VITAMINB12, FOLATE, FERRITIN, TIBC, IRON, RETICCTPCT in the last 72 hours. Urinalysis    Component Value Date/Time   COLORURINE YELLOW 11/01/2021 0047   APPEARANCEUR TURBID (A) 11/01/2021 0047   LABSPEC >1.030 (H) 11/01/2021 0047   PHURINE 5.5 11/01/2021 0047   GLUCOSEU NEGATIVE 11/01/2021 0047   HGBUR TRACE (A) 11/01/2021 0047   BILIRUBINUR NEGATIVE 11/01/2021 0047   BILIRUBINUR small 12/08/2014 1305   KETONESUR NEGATIVE 11/01/2021 0047   PROTEINUR NEGATIVE 11/01/2021 0047   UROBILINOGEN 0.2 12/08/2014 1305   UROBILINOGEN 0.2 04/19/2013 0932   NITRITE NEGATIVE 11/01/2021 0047   LEUKOCYTESUR NEGATIVE 11/01/2021 0047   Sepsis Labs Invalid input(s): PROCALCITONIN,  WBC,  LACTICIDVEN Microbiology Recent Results (from the past 240 hour(s))  Resp Panel by RT-PCR (Flu A&B, Covid) Nasopharyngeal Swab     Status: None   Collection Time: 11/01/21 11:16 AM   Specimen:  Nasopharyngeal Swab; Nasopharyngeal(NP) swabs in vial transport medium  Result Value Ref Range Status   SARS Coronavirus 2 by RT PCR NEGATIVE NEGATIVE Final    Comment: (NOTE) SARS-CoV-2 target nucleic acids are NOT DETECTED.  The SARS-CoV-2 RNA is generally detectable in upper respiratory specimens during the acute phase of infection. The lowest concentration of SARS-CoV-2 viral copies this assay can detect is 138 copies/mL. A negative result does not preclude SARS-Cov-2 infection and should not be used as the sole basis for treatment or other patient management decisions. A negative result may occur with  improper specimen collection/handling, submission of specimen other than nasopharyngeal swab, presence of viral mutation(s) within the areas targeted by this assay, and inadequate number of viral copies(<138 copies/mL). A negative result must be combined with clinical observations, patient history, and epidemiological information. The expected result is Negative.  Fact Sheet for Patients:  EntrepreneurPulse.com.au  Fact Sheet for Healthcare Providers:  IncredibleEmployment[employer]be  This test is no t yet approved or cleared by the [place] FDA and  has been authorized for detection and/or diagnosis of SARS-CoV-2 by FDA under an Emergency Use Authorization (EUA). This EUA will remain  in effect (meaning this test can be used) for the duration of the COVID-19 declaration under Section 564(b)(1) of the Act, 21 U.S.C.section 360bbb-3(b)(1), unless the authorization is terminated  or revoked sooner.       Influenza A by PCR NEGATIVE NEGATIVE Final   Influenza B by PCR NEGATIVE NEGATIVE Final    Comment: (NOTE) The Xpert Xpress SARS-CoV-2/FLU/RSV plus assay is intended as an aid in the diagnosis of influenza from Nasopharyngeal swab specimens and should not be used as a sole basis for treatment. Nasal washings and aspirates are unacceptable for  Xpert Xpress SARS-CoV-2/FLU/RSV testing.  Fact Sheet for Patients: EntrepreneurPulse.com.au  Fact Sheet for Healthcare Providers: IncredibleEmployment[employer]be  This test is not yet approved or cleared by the [place] FDA and has been authorized for detection and/or diagnosis of SARS-CoV-2 by FDA under an Emergency Use Authorization (EUA). This EUA will remain in effect (meaning this test can be used) for the duration of the COVID-19 declaration  under Section 564(b)(1) of the Act, 21 U.S.C. section 360bbb-3(b)(1), unless the authorization is terminated or revoked.  Performed at [facility], [place] [address] [place], [place] [address]      Time coordinating discharge: Over 30 minutes  SIGNED:   Nolberto Hanlon, MD  [facility] 11/03/2021, 5:22 PM Pager   If 7PM-7AM, please contact night-coverage [email or web address] Password [email or web address]

## 2022-01-22 ENCOUNTER — Encounter (HOSPITAL_BASED_OUTPATIENT_CLINIC_OR_DEPARTMENT_OTHER): Payer: Self-pay

## 2022-01-22 ENCOUNTER — Ambulatory Visit (HOSPITAL_BASED_OUTPATIENT_CLINIC_OR_DEPARTMENT_OTHER): Admit: 2022-01-22 | Payer: BC Managed Care – PPO | Admitting: Urology

## 2022-01-22 SURGERY — CYSTOSCOPY/URETEROSCOPY/HOLMIUM LASER/STENT PLACEMENT
Anesthesia: General | Laterality: Right

## 2022-12-01 ENCOUNTER — Emergency Department (HOSPITAL_BASED_OUTPATIENT_CLINIC_OR_DEPARTMENT_OTHER): Payer: BC Managed Care – PPO | Admitting: Radiology

## 2022-12-01 ENCOUNTER — Emergency Department (HOSPITAL_BASED_OUTPATIENT_CLINIC_OR_DEPARTMENT_OTHER)
Admission: EM | Admit: 2022-12-01 | Discharge: 2022-12-01 | Disposition: A | Discharge status: Home / Self Care | Payer: BC Managed Care – PPO | Attending: Emergency Medicine | Admitting: Emergency Medicine

## 2022-12-01 ENCOUNTER — Other Ambulatory Visit: Payer: Self-pay

## 2022-12-01 ENCOUNTER — Encounter (HOSPITAL_BASED_OUTPATIENT_CLINIC_OR_DEPARTMENT_OTHER): Payer: Self-pay | Admitting: Emergency Medicine

## 2022-12-01 DIAGNOSIS — I10 Essential (primary) hypertension: Secondary | ICD-10-CM | POA: Insufficient documentation

## 2022-12-01 DIAGNOSIS — Z79899 Other long term (current) drug therapy: Secondary | ICD-10-CM | POA: Diagnosis not present

## 2022-12-01 DIAGNOSIS — E119 Type 2 diabetes mellitus without complications: Secondary | ICD-10-CM | POA: Diagnosis not present

## 2022-12-01 DIAGNOSIS — J069 Acute upper respiratory infection, unspecified: Secondary | ICD-10-CM | POA: Insufficient documentation

## 2022-12-01 DIAGNOSIS — Z6841 Body Mass Index (BMI) 40.0 and over, adult: Secondary | ICD-10-CM | POA: Insufficient documentation

## 2022-12-01 DIAGNOSIS — Z1152 Encounter for screening for COVID-19: Secondary | ICD-10-CM | POA: Insufficient documentation

## 2022-12-01 DIAGNOSIS — Z794 Long term (current) use of insulin: Secondary | ICD-10-CM | POA: Insufficient documentation

## 2022-12-01 DIAGNOSIS — G4733 Obstructive sleep apnea (adult) (pediatric): Secondary | ICD-10-CM | POA: Insufficient documentation

## 2022-12-01 DIAGNOSIS — R079 Chest pain, unspecified: Secondary | ICD-10-CM | POA: Diagnosis present

## 2022-12-01 LAB — CBC
HCT: 36.1 % (ref 36.0–46.0)
Hemoglobin: 12.9 g/dL (ref 12.0–15.0)
MCH: 32.3 pg (ref 26.0–34.0)
MCHC: 35.7 g/dL (ref 30.0–36.0)
MCV: 90.5 fL (ref 80.0–100.0)
Platelets: 279 10*3/uL (ref 150–400)
RBC: 3.99 MIL/uL (ref 3.87–5.11)
RDW: 13.5 % (ref 11.5–15.5)
WBC: 9.9 10*3/uL (ref 4.0–10.5)
nRBC: 0 % (ref 0.0–0.2)

## 2022-12-01 LAB — BASIC METABOLIC PANEL
Anion gap: 11 (ref 5–15)
BUN: 26 mg/dL — ABNORMAL HIGH (ref 6–20)
CO2: 22 mmol/L (ref 22–32)
Calcium: 9.1 mg/dL (ref 8.9–10.3)
Chloride: 108 mmol/L (ref 98–111)
Creatinine, Ser: 0.76 mg/dL (ref 0.44–1.00)
GFR, Estimated: >60 mL/min (ref >60)
Glucose, Bld: 187 mg/dL — ABNORMAL HIGH (ref 70–99)
Potassium: 4.3 mmol/L (ref 3.5–5.1)
Sodium: 141 mmol/L (ref 135–145)

## 2022-12-01 LAB — RESP PANEL BY RT-PCR (RSV, FLU A&B, COVID)  RVPGX2
Influenza A by PCR: NEGATIVE
Influenza B by PCR: NEGATIVE
Resp Syncytial Virus by PCR: NEGATIVE
SARS Coronavirus 2 by RT PCR: NEGATIVE

## 2022-12-01 LAB — TROPONIN I (HIGH SENSITIVITY): Troponin I (High Sensitivity): 6 ng/L (ref <18)

## 2022-12-01 MED ORDER — PROCHLORPERAZINE EDISYLATE 10 MG/2ML IJ SOLN
5.0000 mg | Freq: Once | INTRAMUSCULAR | Status: AC
  Administered 2022-12-01: 5 mg via INTRAVENOUS
  Filled 2022-12-01: qty 2

## 2022-12-01 NOTE — ED Notes (Signed)
Patient transported to X-ray 

## 2022-12-01 NOTE — ED Notes (Signed)
Pt d/c home per MD order, discharge summary reviewed, pt verbalizes understanding. Ambulatory off unit. No s/s of acute distress noted.

## 2022-12-01 NOTE — ED Provider Notes (Signed)
[facility] Provider Note   CSN: [id number] Arrival date & time: 12/01/22  [id number]     History  Chief Complaint  Patient presents with   Cough    [name] is a 57 y.o. female.   Cough Patient presents with chest pain cough.  Has had for the last few days.  States she has had some issues with her CPAP machine and fluid going into her lungs.  States chest is tight.  Has had cough with some green sputum production.  No fevers.  States more tired.  States also has a headache.  No definite sick contacts.  No swelling in her legs.    Past Medical History:  Diagnosis Date   Anxiety    Arthritis    Diabetes mellitus    Endometriosis    Hypersomnia, persistent    epworth 16    Hypertension    Lyme borreliosis    Migraine    Morbid obesity ([place])    Obesity, Class III, BMI 40-49.9 (morbid obesity) ([place])    Obstructive sleep apnea    CPAP NIGHTLY; sleep study 2007.   Renal disorder    kidney stone    Home Medications Prior to Admission medications   Medication Sig Start Date End Date Taking? Authorizing Provider  albuterol (PROVENTIL HFA;VENTOLIN HFA) 108 (90 Base) MCG/ACT inhaler Inhale 2 puffs into the lungs every 6 (six) hours as needed for wheezing or shortness of breath. 02/28/18   Florencia Reasons, MD  atorvastatin (LIPITOR) 40 MG tablet Take 1 tablet (40 mg total) by mouth daily. 04/24/18   Weber, Damaris Hippo, PA-C  BIOTIN PO Take 1 capsule by mouth daily.    [provider]  blood glucose meter kit and supplies KIT by Does not apply route in the morning and at bedtime.    [provider]  Continuous Blood Gluc Sensor (FREESTYLE LIBRE 14 DAY SENSOR) MISC 1 application by Does not apply route every 14 (fourteen) days. 04/20/18   Weber, Damaris Hippo, PA-C  diazepam (VALIUM) 2 MG tablet Take 0.5-1 tablets (1-2 mg total) every 12 (twelve) hours as needed by mouth for anxiety or muscle spasms. 08/16/17   McVey, Gelene Mink, PA-C   diclofenac Sodium (VOLTAREN) 1 % GEL Apply 2 g topically 4 (four) times daily as needed. Patient taking differently: Apply 2 g topically 4 (four) times daily as needed (stomach pain). 03/12/21   Long, Wonda Olds, MD  ergocalciferol (VITAMIN D2) 1.25 MG (50000 UT) capsule Take 50,000 Units by mouth every Saturday. 01/12/21   [provider]  ferrous sulfate 325 (65 FE) MG tablet Take 325 mg by mouth daily with breakfast.    [provider]  FLUoxetine (PROZAC) 20 MG capsule Take 1 capsule (20 mg total) by mouth 2 (two) times daily. 04/20/18   Weber, Damaris Hippo, PA-C  furosemide (LASIX) 20 MG tablet Take 2 tablets (40 mg total) by mouth daily. 05/23/18 11/01/21  Gale Journey, Damaris Hippo, PA-C  gemfibrozil (LOPID) 600 MG tablet Take 1 tablet (600 mg total) by mouth 2 (two) times daily before a meal. 04/18/18   Weber, Damaris Hippo, PA-C  Insulin Glargine (TOUJEO SOLOSTAR) 300 UNIT/ML SOPN Inject 50 Units into the skin 2 (two) times daily. 05/23/18   Gale Journey, Damaris Hippo, PA-C  metoprolol tartrate (LOPRESSOR) 50 MG tablet Take 1 tablet (50 mg total) by mouth 2 (two) times daily. 04/20/18   Weber, Damaris Hippo, PA-C  naloxone [facility]) nasal spray 4 mg/0.1 mL  Place 0.4 mg into the nose as needed (accidental overdose). 10/30/19   [provider]  NOVOTWIST 32G X 5 MM MISC USE 1 APPLICATION BY DOES NOT APPLY ROUTE DAILY. 04/20/18   Weber, Damaris Hippo, PA-C  pantoprazole (PROTONIX) 40 MG tablet Take 40 mg by mouth 2 (two) times daily. 05/26/21   [provider]  potassium chloride SA (K-DUR,KLOR-CON) 20 MEQ tablet Take 2 tablets (40 mEq total) by mouth daily. 04/20/18   Weber, Damaris Hippo, PA-C  progesterone (PROMETRIUM) 200 MG capsule Take 200 mg by mouth at bedtime. 10/09/20   [provider]  promethazine (PHENERGAN) 25 MG tablet Take 1 tablet (25 mg total) by mouth every 8 (eight) hours as needed for nausea or vomiting. 11/11/17   Quintella Reichert, MD  Semaglutide, 2 MG/DOSE, (OZEMPIC, 2 MG/DOSE,) 8 MG/3ML SOPN  Inject 2 mg into the skin every Wednesday. 01/26/21   [provider]  senna-docusate (SENOKOT-S) 8.6-50 MG tablet Take 1 tablet by mouth at bedtime as needed for mild constipation or moderate constipation. Patient not taking: Reported on 11/01/2021 03/12/21   Long, Wonda Olds, MD  tamsulosin (FLOMAX) 0.4 MG CAPS capsule Take 1 capsule (0.4 mg total) by mouth daily. 10/20/21   Lucrezia Starch, MD  vitamin B-12 (CYANOCOBALAMIN) 1000 MCG tablet Take 1,000 mcg by mouth daily.    [provider]  Ranitidine HCl (ZANTAC PO) Take 1 tablet by mouth 2 (two) times daily. Patient uses this medication for heartburn.  06/03/13  [provider]      Allergies    Contrast media [iodinated contrast media], Darvocet [propoxyphene n-acetaminophen], Lisinopril, Metformin and related, Morphine and related, Percocet [oxycodone-acetaminophen], Robaxin [methocarbamol], Wellbutrin [bupropion hcl], and Zofran [ondansetron hcl]    Review of Systems   Review of Systems  Respiratory:  Positive for cough.     Physical Exam Updated Vital Signs BP 134/72   Pulse 76   Temp 98.7 F (37.1 C) (Oral)   Resp 17   SpO2 97%  Physical Exam Vitals reviewed.  Eyes:     Pupils: Pupils are equal, round, and reactive to light.  Cardiovascular:     Rate and Rhythm: Regular rhythm.  Pulmonary:     Breath sounds: No wheezing or rhonchi.  Chest:     Chest wall: No tenderness.  Abdominal:     Tenderness: There is no abdominal tenderness.  Musculoskeletal:     Cervical back: Neck supple.     Right lower leg: No edema.     Left lower leg: No edema.  Neurological:     Mental Status: She is alert.     ED Results / Procedures / Treatments   Labs (all labs ordered are listed, but only abnormal results are displayed) Labs Reviewed  BASIC METABOLIC PANEL - Abnormal; Notable for the following components:      Result Value   Glucose, Bld 187 (*)    BUN 26 (*)    All other components within normal  limits  RESP PANEL BY RT-PCR (RSV, FLU A&B, COVID)  [id number]  CBC  TROPONIN I (HIGH SENSITIVITY)    EKG EKG Interpretation  Date/Time:  Wednesday December 01 2022 07:49:30 EST Ventricular Rate:  81 PR Interval:  167 QRS Duration: 84 QT Interval:  384 QTC Calculation: 446 R Axis:   81 Text Interpretation: Sinus rhythm Probable anterior infarct, old Confirmed by Davonna Belling [phone]) on 12/01/2022 12:11:17 PM  Radiology DG Chest 2 View  Result Date: 12/01/2022 CLINICAL DATA:  Shortness  of breath EXAM: CHEST - 2 VIEW COMPARISON:  Chest x-ray dated Feb 21, 2018 FINDINGS: The heart size and mediastinal contours are within normal limits. Both lungs are clear. The visualized skeletal structures are unremarkable. IMPRESSION: No active cardiopulmonary disease. Electronically Signed   By: Yetta Glassman M.D.   On: 12/01/2022 08:31    Procedures Procedures    Medications Ordered in ED Medications  prochlorperazine (COMPAZINE) injection 5 mg (5 mg Intravenous Given 12/01/22 1025)    ED Course/ Medical Decision Making/ A&P                             Medical Decision Making Amount and/or Complexity of Data Reviewed Labs: ordered. Radiology: ordered.  Risk Prescription drug management.   Patient with cough headache myalgias.  Has had for the last few days.  Also chest tightness.  High risk for cardiac cause due to comorbidities.  Although I think more likely respiratory cause.  Has been coughing.  Will get x-ray COVID and flu testing.  Also get basic blood work and EKG.  Workup reassuring.  Negative COVID flu testing.  X-ray does not show pneumonia.  EKG reassuring.  Blood work reassuring.  Discharge home        Final Clinical Impression(s) / ED Diagnoses Final diagnoses:  Upper respiratory tract infection, unspecified type    Rx / DC Orders ED Discharge Orders     None         Davonna Belling, MD 12/01/22 1413

## 2022-12-01 NOTE — ED Triage Notes (Signed)
Pt reports cough, congestion, and SHOB.

## 2022-12-24 ENCOUNTER — Emergency Department (HOSPITAL_BASED_OUTPATIENT_CLINIC_OR_DEPARTMENT_OTHER): Payer: BC Managed Care – PPO

## 2022-12-24 ENCOUNTER — Encounter (HOSPITAL_BASED_OUTPATIENT_CLINIC_OR_DEPARTMENT_OTHER): Payer: Self-pay | Admitting: Emergency Medicine

## 2022-12-24 ENCOUNTER — Other Ambulatory Visit (HOSPITAL_BASED_OUTPATIENT_CLINIC_OR_DEPARTMENT_OTHER): Payer: Self-pay

## 2022-12-24 ENCOUNTER — Emergency Department (HOSPITAL_BASED_OUTPATIENT_CLINIC_OR_DEPARTMENT_OTHER)
Admission: EM | Admit: 2022-12-24 | Discharge: 2022-12-24 | Disposition: A | Discharge status: Home / Self Care | Payer: BC Managed Care – PPO | Attending: Emergency Medicine | Admitting: Emergency Medicine

## 2022-12-24 ENCOUNTER — Other Ambulatory Visit: Payer: Self-pay

## 2022-12-24 DIAGNOSIS — N12 Tubulo-interstitial nephritis, not specified as acute or chronic: Secondary | ICD-10-CM | POA: Insufficient documentation

## 2022-12-24 DIAGNOSIS — R109 Unspecified abdominal pain: Secondary | ICD-10-CM | POA: Diagnosis present

## 2022-12-24 DIAGNOSIS — I10 Essential (primary) hypertension: Secondary | ICD-10-CM | POA: Diagnosis not present

## 2022-12-24 DIAGNOSIS — Z7984 Long term (current) use of oral hypoglycemic drugs: Secondary | ICD-10-CM | POA: Insufficient documentation

## 2022-12-24 DIAGNOSIS — Z79899 Other long term (current) drug therapy: Secondary | ICD-10-CM | POA: Diagnosis not present

## 2022-12-24 DIAGNOSIS — Z794 Long term (current) use of insulin: Secondary | ICD-10-CM | POA: Insufficient documentation

## 2022-12-24 DIAGNOSIS — E119 Type 2 diabetes mellitus without complications: Secondary | ICD-10-CM | POA: Diagnosis not present

## 2022-12-24 LAB — COMPREHENSIVE METABOLIC PANEL
ALT: 31 U/L (ref 0–44)
AST: 17 U/L (ref 15–41)
Albumin: 3.5 g/dL (ref 3.5–5.0)
Alkaline Phosphatase: 76 U/L (ref 38–126)
Anion gap: 9 (ref 5–15)
BUN: 28 mg/dL — ABNORMAL HIGH (ref 6–20)
CO2: 24 mmol/L (ref 22–32)
Calcium: 9.6 mg/dL (ref 8.9–10.3)
Chloride: 106 mmol/L (ref 98–111)
Creatinine, Ser: 0.92 mg/dL (ref 0.44–1.00)
GFR, Estimated: >60 mL/min (ref >60)
Glucose, Bld: 206 mg/dL — ABNORMAL HIGH (ref 70–99)
Potassium: 3.7 mmol/L (ref 3.5–5.1)
Sodium: 139 mmol/L (ref 135–145)
Total Bilirubin: 0.3 mg/dL (ref 0.3–1.2)
Total Protein: 6.5 g/dL (ref 6.5–8.1)

## 2022-12-24 LAB — CBC WITH DIFFERENTIAL/PLATELET
Abs Immature Granulocytes: 0.04 10*3/uL (ref 0.00–0.07)
Basophils Absolute: 0 10*3/uL (ref 0.0–0.1)
Basophils Relative: 0 %
Eosinophils Absolute: 0 10*3/uL (ref 0.0–0.5)
Eosinophils Relative: 0 %
HCT: 39.9 % (ref 36.0–46.0)
Hemoglobin: 13.9 g/dL (ref 12.0–15.0)
Immature Granulocytes: 0 %
Lymphocytes Relative: 24 %
Lymphs Abs: 2.5 10*3/uL (ref 0.7–4.0)
MCH: 31.5 pg (ref 26.0–34.0)
MCHC: 34.8 g/dL (ref 30.0–36.0)
MCV: 90.5 fL (ref 80.0–100.0)
Monocytes Absolute: 1 10*3/uL (ref 0.1–1.0)
Monocytes Relative: 9 %
Neutro Abs: 6.8 10*3/uL (ref 1.7–7.7)
Neutrophils Relative %: 67 %
Platelets: 334 10*3/uL (ref 150–400)
RBC: 4.41 MIL/uL (ref 3.87–5.11)
RDW: 13.2 % (ref 11.5–15.5)
WBC: 10.3 10*3/uL (ref 4.0–10.5)
nRBC: 0 % (ref 0.0–0.2)

## 2022-12-24 LAB — URINALYSIS, ROUTINE W REFLEX MICROSCOPIC
Bilirubin Urine: NEGATIVE
Glucose, UA: NEGATIVE mg/dL
Ketones, ur: NEGATIVE mg/dL
Nitrite: POSITIVE — AB
Protein, ur: 30 mg/dL — AB
RBC / HPF: >50 RBC/hpf (ref 0–5)
Specific Gravity, Urine: 1.031 — ABNORMAL HIGH (ref 1.005–1.030)
WBC, UA: >50 WBC/hpf (ref 0–5)
pH: 5.5 (ref 5.0–8.0)

## 2022-12-24 LAB — PREGNANCY, URINE: Preg Test, Ur: NEGATIVE

## 2022-12-24 LAB — LIPASE, BLOOD: Lipase: 76 U/L — ABNORMAL HIGH (ref 11–51)

## 2022-12-24 MED ORDER — NAPROXEN 375 MG PO TABS
375.0000 mg | ORAL_TABLET | Freq: Two times a day (BID) | ORAL | 0 refills | Status: AC
  Filled 2022-12-24: qty 20, 10d supply, fill #0

## 2022-12-24 MED ORDER — SODIUM CHLORIDE 0.9 % IV SOLN: INTRAVENOUS | Status: DC

## 2022-12-24 MED ORDER — CEFUROXIME AXETIL 500 MG PO TABS
500.0000 mg | ORAL_TABLET | Freq: Two times a day (BID) | ORAL | 0 refills | Status: AC
  Filled 2022-12-24: qty 14, 7d supply, fill #0

## 2022-12-24 MED ORDER — SODIUM CHLORIDE 0.9 % IV SOLN
1.0000 g | Freq: Once | INTRAVENOUS | Status: AC
  Administered 2022-12-24: 1 g via INTRAVENOUS
  Filled 2022-12-24: qty 10

## 2022-12-24 MED ORDER — PROCHLORPERAZINE EDISYLATE 10 MG/2ML IJ SOLN
10.0000 mg | Freq: Once | INTRAMUSCULAR | Status: AC
  Administered 2022-12-24: 10 mg via INTRAVENOUS
  Filled 2022-12-24: qty 2

## 2022-12-24 MED ORDER — PROMETHAZINE HCL 25 MG PO TABS
25.0000 mg | ORAL_TABLET | Freq: Four times a day (QID) | ORAL | 0 refills | Status: DC | PRN
  Filled 2022-12-24: qty 12, 3d supply, fill #0

## 2022-12-24 MED ORDER — SODIUM CHLORIDE 0.9 % IV BOLUS
1000.0000 mL | Freq: Once | INTRAVENOUS | Status: AC
  Administered 2022-12-24: 1000 mL via INTRAVENOUS

## 2022-12-24 MED ORDER — HYDROMORPHONE HCL 1 MG/ML IJ SOLN
0.5000 mg | Freq: Once | INTRAMUSCULAR | Status: AC
  Administered 2022-12-24: 0.5 mg via INTRAVENOUS
  Filled 2022-12-24: qty 1

## 2022-12-24 NOTE — ED Provider Notes (Signed)
[facility] Provider Note   CSN: [id number] Arrival date & time: 12/24/22  1311     History  Chief Complaint  Patient presents with   Abdominal Pain   Diarrhea    [name] is a 57 y.o. female.   Abdominal Pain Associated symptoms: diarrhea   Diarrhea Associated symptoms: abdominal pain      Patient presents to the ED with complaints of abdominal pain vomiting and diarrhea.  Patient has a history of diabetes endometriosis hypertension obesity sleep apnea.  Patient states she started having symptoms a few days ago with vomiting as well as diarrhea.  She had several episodes of both.  In the last day or so that has decreased with no episodes of vomiting or diarrhea today.  Patient however started having more pain in the left side of her abdomen that is increasing in severity.  No fevers.  She has had diverticulitis in the past.  Home Medications Prior to Admission medications   Medication Sig Start Date End Date Taking? Authorizing Provider  allopurinol (ZYLOPRIM) 100 MG tablet Take 100 mg by mouth daily. 11/29/22  Yes [provider]  cefUROXime (CEFTIN) 500 MG tablet Take 1 tablet (500 mg total) by mouth 2 (two) times daily with a meal for 7 days. 12/24/22 12/31/22 Yes Dorie Rank, MD  naproxen (NAPROSYN) 375 MG tablet Take 1 tablet (375 mg total) by mouth 2 (two) times daily. 12/24/22  Yes Dorie Rank, MD  promethazine (PHENERGAN) 25 MG tablet Take 1 tablet (25 mg total) by mouth every 6 (six) hours as needed for nausea or vomiting. 12/24/22  Yes Dorie Rank, MD  albuterol (PROVENTIL HFA;VENTOLIN HFA) 108 (90 Base) MCG/ACT inhaler Inhale 2 puffs into the lungs every 6 (six) hours as needed for wheezing or shortness of breath. 02/28/18   Florencia Reasons, MD  atorvastatin (LIPITOR) 40 MG tablet Take 1 tablet (40 mg total) by mouth daily. 04/24/18   Weber, Damaris Hippo, PA-C  BIOTIN PO Take 1 capsule by mouth daily.    [provider]   blood glucose meter kit and supplies KIT by Does not apply route in the morning and at bedtime.    [provider]  celecoxib (CELEBREX) 200 MG capsule Take 200 mg by mouth daily.    [provider]  Continuous Blood Gluc Sensor (FREESTYLE LIBRE 14 DAY SENSOR) MISC 1 application by Does not apply route every 14 (fourteen) days. 04/20/18   Weber, Damaris Hippo, PA-C  diazepam (VALIUM) 2 MG tablet Take 0.5-1 tablets (1-2 mg total) every 12 (twelve) hours as needed by mouth for anxiety or muscle spasms. 08/16/17   McVey, Gelene Mink, PA-C  diclofenac Sodium (VOLTAREN) 1 % GEL Apply 2 g topically 4 (four) times daily as needed. Patient taking differently: Apply 2 g topically 4 (four) times daily as needed (stomach pain). 03/12/21   Long, Wonda Olds, MD  ergocalciferol (VITAMIN D2) 1.25 MG (50000 UT) capsule Take 50,000 Units by mouth every Saturday. 01/12/21   [provider]  ferrous sulfate 325 (65 FE) MG tablet Take 325 mg by mouth daily with breakfast.    [provider]  FLUoxetine (PROZAC) 20 MG capsule Take 1 capsule (20 mg total) by mouth 2 (two) times daily. 04/20/18   Weber, Damaris Hippo, PA-C  furosemide (LASIX) 20 MG tablet Take 2 tablets (40 mg total) by mouth daily. 05/23/18 11/01/21  Gale Journey, Damaris Hippo, PA-C  gemfibrozil (LOPID) 600 MG tablet Take 1 tablet (  600 mg total) by mouth 2 (two) times daily before a meal. 04/18/18   Weber, Damaris Hippo, PA-C  Insulin Glargine (TOUJEO SOLOSTAR) 300 UNIT/ML SOPN Inject 50 Units into the skin 2 (two) times daily. 05/23/18   Gale Journey, Damaris Hippo, PA-C  metoprolol tartrate (LOPRESSOR) 50 MG tablet Take 1 tablet (50 mg total) by mouth 2 (two) times daily. 04/20/18   Weber, Damaris Hippo, PA-C  MOUNJARO 7.5 MG/0.5ML Pen Inject into the skin.    [provider]  naloxone [facility]) nasal spray 4 mg/0.1 mL Place 0.4 mg into the nose as needed (accidental overdose). 10/30/19   [provider]  NOVOTWIST 32G X 5 MM MISC USE 1 APPLICATION BY DOES  NOT APPLY ROUTE DAILY. 04/20/18   Weber, Damaris Hippo, PA-C  pantoprazole (PROTONIX) 40 MG tablet Take 40 mg by mouth 2 (two) times daily. 05/26/21   [provider]  potassium chloride SA (K-DUR,KLOR-CON) 20 MEQ tablet Take 2 tablets (40 mEq total) by mouth daily. 04/20/18   Weber, Damaris Hippo, PA-C  progesterone (PROMETRIUM) 200 MG capsule Take 200 mg by mouth at bedtime. 10/09/20   [provider]  Semaglutide, 2 MG/DOSE, (OZEMPIC, 2 MG/DOSE,) 8 MG/3ML SOPN Inject 2 mg into the skin every Wednesday. 01/26/21   [provider]  senna-docusate (SENOKOT-S) 8.6-50 MG tablet Take 1 tablet by mouth at bedtime as needed for mild constipation or moderate constipation. Patient not taking: Reported on 11/01/2021 03/12/21   Long, Wonda Olds, MD  tamsulosin (FLOMAX) 0.4 MG CAPS capsule Take 1 capsule (0.4 mg total) by mouth daily. 10/20/21   Lucrezia Starch, MD  vitamin B-12 (CYANOCOBALAMIN) 1000 MCG tablet Take 1,000 mcg by mouth daily.    [provider]  Ranitidine HCl (ZANTAC PO) Take 1 tablet by mouth 2 (two) times daily. Patient uses this medication for heartburn.  06/03/13  [provider]      Allergies    Contrast media [iodinated contrast media], Darvocet [propoxyphene n-acetaminophen], Lisinopril, Metformin and related, Morphine and related, Percocet [oxycodone-acetaminophen], Robaxin [methocarbamol], Wellbutrin [bupropion hcl], and Zofran [ondansetron hcl]    Review of Systems   Review of Systems  Gastrointestinal:  Positive for abdominal pain and diarrhea.    Physical Exam Updated Vital Signs BP 104/78   Pulse 78   Temp 98.7 F (37.1 C) (Oral)   Resp 16   Ht 1.549 m (5\' 1" )   Wt 104.8 kg   SpO2 97%   BMI 43.65 kg/m  Physical Exam Vitals and nursing note reviewed.  Constitutional:      General: She is not in acute distress.    Appearance: She is well-developed.  HENT:     Head: Normocephalic and atraumatic.     Right Ear: External ear normal.      Left Ear: External ear normal.  Eyes:     General: No scleral icterus.       Right eye: No discharge.        Left eye: No discharge.     Conjunctiva/sclera: Conjunctivae normal.  Neck:     Trachea: No tracheal deviation.  Cardiovascular:     Rate and Rhythm: Normal rate and regular rhythm.  Pulmonary:     Effort: Pulmonary effort is normal. No respiratory distress.     Breath sounds: Normal breath sounds. No stridor. No wheezing or rales.  Abdominal:     General: Bowel sounds are normal. There is no distension.     Palpations: Abdomen is soft.  Tenderness: There is abdominal tenderness in the left upper quadrant and left lower quadrant. There is no guarding or rebound.  Musculoskeletal:        General: No tenderness or deformity.     Cervical back: Neck supple.  Skin:    General: Skin is warm and dry.     Findings: No rash.  Neurological:     General: No focal deficit present.     Mental Status: She is alert.     Cranial Nerves: No cranial nerve deficit, dysarthria or facial asymmetry.     Sensory: No sensory deficit.     Motor: No abnormal muscle tone or seizure activity.     Coordination: Coordination normal.  Psychiatric:        Mood and Affect: Mood normal.     ED Results / Procedures / Treatments   Labs (all labs ordered are listed, but only abnormal results are displayed) Labs Reviewed  COMPREHENSIVE METABOLIC PANEL - Abnormal; Notable for the following components:      Result Value   Glucose, Bld 206 (*)    BUN 28 (*)    All other components within normal limits  LIPASE, BLOOD - Abnormal; Notable for the following components:   Lipase 76 (*)    All other components within normal limits  URINALYSIS, ROUTINE W REFLEX MICROSCOPIC - Abnormal; Notable for the following components:   APPearance HAZY (*)    Specific Gravity, Urine 1.031 (*)    Hgb urine dipstick LARGE (*)    Protein, ur 30 (*)    Nitrite POSITIVE (*)    Leukocytes,Ua MODERATE (*)    Bacteria,  UA MANY (*)    All other components within normal limits  CBC WITH DIFFERENTIAL/PLATELET  PREGNANCY, URINE    EKG None  Radiology No results found.  Procedures Procedures    Medications Ordered in ED Medications  sodium chloride 0.9 % bolus 1,000 mL (1,000 mLs Intravenous New Bag/Given 12/24/22 1424)    And  0.9 %  sodium chloride infusion ( Intravenous New Bag/Given 12/24/22 1533)  cefTRIAXone (ROCEPHIN) 1 g in sodium chloride 0.9 % 100 mL IVPB (1 g Intravenous New Bag/Given 12/24/22 1540)  prochlorperazine (COMPAZINE) injection 10 mg (10 mg Intravenous Given 12/24/22 1424)  HYDROmorphone (DILAUDID) injection 0.5 mg (0.5 mg Intravenous Given 12/24/22 1424)    ED Course/ Medical Decision Making/ A&P Clinical Course as of 12/24/22 1541  Fri Dec 24, 2022  1449 CBC with Diff CBC normal.  Metabolic panel notable for increased BUN [JK]  1450 Patient has known contrast allergy.  Will proceed with CT scan without IV contrast [JK]  1530 Stable 56 YOF with abdominal pain. Left sided pain.  Hx of Diverticulitis and UL  Workup without  contrast due to allergy.   [CC]    Clinical Course User Index [CC] Tretha Sciara, MD [JK] Dorie Rank, MD                             Medical Decision Making Frontal diagnosis includes but not limited to diverticulitis colitis pyelonephritis ureteral stone, gastroenteritis  Problems Addressed: Pyelonephritis: acute illness or injury that poses a threat to life or bodily functions  Amount and/or Complexity of Data Reviewed Labs: ordered. Decision-making details documented in ED Course. Radiology: ordered.  Risk Prescription drug management.   Patient presented ED with complaints of vomiting diarrhea as well as abdominal pain.  Patient had tenderness primarily in the left side  of abdomen.  Labs did show slightly elevated lipase but I doubt that is clinically significant.  Metabolic panel did not show any acute abnormalities.  CBC was normal.   Urinalysis did show greater than 50 white blood cells many bacteria positive nitrate moderate leukocyte esterase.  Findings were suggestive of urinary tract infection possibly pyelonephritis.  Considered CT scan to evaluate for possible ureteral stone as well as diverticulitis.  Discussed this with the patient and she would not like to have a CT scan performed at this time.  She would like to try course of antibiotics and if not getting better she will return.        Final Clinical Impression(s) / ED Diagnoses Final diagnoses:  Pyelonephritis    Rx / DC Orders ED Discharge Orders          Ordered    cefUROXime (CEFTIN) 500 MG tablet  2 times daily with meals        12/24/22 1539    naproxen (NAPROSYN) 375 MG tablet  2 times daily        12/24/22 1539    promethazine (PHENERGAN) 25 MG tablet  Every 6 hours PRN        12/24/22 1539              Dorie Rank, MD 12/24/22 1541

## 2022-12-24 NOTE — ED Triage Notes (Signed)
Pt arrives to ED with c/o left sided abdominal pain x4 days and diarrhea, nausea x2 days.

## 2022-12-24 NOTE — Discharge Instructions (Addendum)
Take the antibiotics as prescribed.    Return to the ER if you start having fevers worsening symptoms.  Follow-up with your doctor to be rechecked next week

## 2023-11-22 ENCOUNTER — Emergency Department (HOSPITAL_BASED_OUTPATIENT_CLINIC_OR_DEPARTMENT_OTHER): Payer: BC Managed Care – PPO

## 2023-11-22 ENCOUNTER — Emergency Department (HOSPITAL_BASED_OUTPATIENT_CLINIC_OR_DEPARTMENT_OTHER)
Admission: EM | Admit: 2023-11-22 | Discharge: 2023-11-22 | Disposition: A | Discharge status: Home / Self Care | Payer: BC Managed Care – PPO | Attending: Emergency Medicine | Admitting: Emergency Medicine

## 2023-11-22 ENCOUNTER — Other Ambulatory Visit (HOSPITAL_BASED_OUTPATIENT_CLINIC_OR_DEPARTMENT_OTHER): Payer: Self-pay

## 2023-11-22 ENCOUNTER — Encounter (HOSPITAL_BASED_OUTPATIENT_CLINIC_OR_DEPARTMENT_OTHER): Payer: Self-pay | Admitting: Emergency Medicine

## 2023-11-22 ENCOUNTER — Other Ambulatory Visit: Payer: Self-pay

## 2023-11-22 DIAGNOSIS — Z79899 Other long term (current) drug therapy: Secondary | ICD-10-CM | POA: Diagnosis not present

## 2023-11-22 DIAGNOSIS — I10 Essential (primary) hypertension: Secondary | ICD-10-CM | POA: Diagnosis not present

## 2023-11-22 DIAGNOSIS — K5732 Diverticulitis of large intestine without perforation or abscess without bleeding: Secondary | ICD-10-CM | POA: Diagnosis not present

## 2023-11-22 DIAGNOSIS — E119 Type 2 diabetes mellitus without complications: Secondary | ICD-10-CM | POA: Insufficient documentation

## 2023-11-22 DIAGNOSIS — R1032 Left lower quadrant pain: Secondary | ICD-10-CM | POA: Diagnosis present

## 2023-11-22 DIAGNOSIS — Z794 Long term (current) use of insulin: Secondary | ICD-10-CM | POA: Insufficient documentation

## 2023-11-22 DIAGNOSIS — K5792 Diverticulitis of intestine, part unspecified, without perforation or abscess without bleeding: Secondary | ICD-10-CM

## 2023-11-22 LAB — URINALYSIS, ROUTINE W REFLEX MICROSCOPIC
Bacteria, UA: NONE SEEN
Bilirubin Urine: NEGATIVE
Glucose, UA: NEGATIVE mg/dL
Ketones, ur: NEGATIVE mg/dL
Leukocytes,Ua: NEGATIVE
Nitrite: NEGATIVE
Protein, ur: NEGATIVE mg/dL
Specific Gravity, Urine: 1.027 (ref 1.005–1.030)
pH: 7 (ref 5.0–8.0)

## 2023-11-22 LAB — CBC
HCT: 43 % (ref 36.0–46.0)
Hemoglobin: 14.5 g/dL (ref 12.0–15.0)
MCH: 30.3 pg (ref 26.0–34.0)
MCHC: 33.7 g/dL (ref 30.0–36.0)
MCV: 89.8 fL (ref 80.0–100.0)
Platelets: 301 10*3/uL (ref 150–400)
RBC: 4.79 MIL/uL (ref 3.87–5.11)
RDW: 13.5 % (ref 11.5–15.5)
WBC: 12.3 10*3/uL — ABNORMAL HIGH (ref 4.0–10.5)
nRBC: 0 % (ref 0.0–0.2)

## 2023-11-22 LAB — COMPREHENSIVE METABOLIC PANEL
ALT: 22 U/L (ref 0–44)
AST: 15 U/L (ref 15–41)
Albumin: 4.5 g/dL (ref 3.5–5.0)
Alkaline Phosphatase: 114 U/L (ref 38–126)
Anion gap: 9 (ref 5–15)
BUN: 22 mg/dL — ABNORMAL HIGH (ref 6–20)
CO2: 28 mmol/L (ref 22–32)
Calcium: 10.1 mg/dL (ref 8.9–10.3)
Chloride: 102 mmol/L (ref 98–111)
Creatinine, Ser: 0.83 mg/dL (ref 0.44–1.00)
GFR, Estimated: >60 mL/min (ref >60)
Glucose, Bld: 204 mg/dL — ABNORMAL HIGH (ref 70–99)
Potassium: 4.2 mmol/L (ref 3.5–5.1)
Sodium: 139 mmol/L (ref 135–145)
Total Bilirubin: 0.5 mg/dL (ref 0.0–1.2)
Total Protein: 7.6 g/dL (ref 6.5–8.1)

## 2023-11-22 LAB — LIPASE, BLOOD: Lipase: 44 U/L (ref 11–51)

## 2023-11-22 MED ORDER — METRONIDAZOLE 500 MG PO TABS
500.0000 mg | ORAL_TABLET | Freq: Three times a day (TID) | ORAL | 0 refills | Status: AC
  Filled 2023-11-22: qty 15, 5d supply, fill #0

## 2023-11-22 MED ORDER — METOCLOPRAMIDE HCL 10 MG PO TABS
10.0000 mg | ORAL_TABLET | Freq: Three times a day (TID) | ORAL | 0 refills | Status: AC | PRN
  Filled 2023-11-22: qty 30, 10d supply, fill #0

## 2023-11-22 MED ORDER — FENTANYL CITRATE PF 50 MCG/ML IJ SOSY
25.0000 ug | PREFILLED_SYRINGE | Freq: Once | INTRAMUSCULAR | Status: AC
  Administered 2023-11-22: 25 ug via INTRAVENOUS
  Filled 2023-11-22: qty 1

## 2023-11-22 MED ORDER — PROMETHAZINE HCL 25 MG RE SUPP
25.0000 mg | Freq: Four times a day (QID) | RECTAL | 0 refills | Status: DC | PRN
  Filled 2023-11-22: qty 12, 3d supply, fill #0

## 2023-11-22 MED ORDER — CIPROFLOXACIN HCL 500 MG PO TABS
500.0000 mg | ORAL_TABLET | Freq: Two times a day (BID) | ORAL | 0 refills | Status: DC
  Filled 2023-11-22: qty 10, 5d supply, fill #0

## 2023-11-22 MED ORDER — METOCLOPRAMIDE HCL 5 MG/ML IJ SOLN
10.0000 mg | Freq: Once | INTRAMUSCULAR | Status: AC
  Administered 2023-11-22: 10 mg via INTRAVENOUS
  Filled 2023-11-22: qty 2

## 2023-11-22 NOTE — ED Provider Notes (Signed)
 [place] EMERGENCY DEPARTMENT AT [facility] Provider Note   CSN: [id number] Arrival date & time: 11/22/23  4098     History  Chief Complaint  Patient presents with   Abdominal Pain    [name] is a 58 y.o. female.   Abdominal Pain   58 year old female presents emergency department with complaints of left lower abdominal pain.  States pain has been present since around 3 AM this morning.  States he has a history of kidney stones and is concerned about the same.  Reports possible increased frequency of urination since symptoms began.  Denies any fever, chills, vomiting, change in bowel habits.  Does report feelings of nausea today with associated pain.  Does report prior abdominal surgeries involving endometriosis.  Past medical history significant for diabetes mellitus, hypertension, obesity, OSA, nephrolithiasis  Home Medications Prior to Admission medications   Medication Sig Start Date End Date Taking? Authorizing Provider  ciprofloxacin (CIPRO) 500 MG tablet Take 1 tablet (500 mg total) by mouth 2 (two) times daily. 11/22/23  Yes Sherian Maroon A, PA  metoCLOPramide (REGLAN) 10 MG tablet Take 1 tablet (10 mg total) by mouth every 8 (eight) hours as needed for nausea or vomiting. 11/22/23  Yes Sherian Maroon A, PA  metroNIDAZOLE (FLAGYL) 500 MG tablet Take 1 tablet (500 mg total) by mouth 3 (three) times daily. 11/22/23  Yes Peter Garter, PA  promethazine (PHENERGAN) 25 MG suppository Place 1 suppository (25 mg total) rectally every 6 (six) hours as needed for nausea or vomiting. 11/22/23  Yes Sherian Maroon A, PA  albuterol (PROVENTIL HFA;VENTOLIN HFA) 108 (90 Base) MCG/ACT inhaler Inhale 2 puffs into the lungs every 6 (six) hours as needed for wheezing or shortness of breath. 02/28/18   Albertine Grates, MD  allopurinol (ZYLOPRIM) 100 MG tablet Take 100 mg by mouth daily. 11/29/22   [provider]  atorvastatin (LIPITOR) 40 MG tablet Take 1 tablet (40 mg  total) by mouth daily. 04/24/18   Weber, Dema Severin, PA-C  BIOTIN PO Take 1 capsule by mouth daily.    [provider]  blood glucose meter kit and supplies KIT by Does not apply route in the morning and at bedtime.    [provider]  celecoxib (CELEBREX) 200 MG capsule Take 200 mg by mouth daily.    [provider]  Continuous Blood Gluc Sensor (FREESTYLE LIBRE 14 DAY SENSOR) MISC 1 application by Does not apply route every 14 (fourteen) days. 04/20/18   Weber, Dema Severin, PA-C  diazepam (VALIUM) 2 MG tablet Take 0.5-1 tablets (1-2 mg total) every 12 (twelve) hours as needed by mouth for anxiety or muscle spasms. 08/16/17   McVey, Madelaine Bhat, PA-C  diclofenac Sodium (VOLTAREN) 1 % GEL Apply 2 g topically 4 (four) times daily as needed. Patient taking differently: Apply 2 g topically 4 (four) times daily as needed (stomach pain). 03/12/21   Long, Arlyss Repress, MD  ergocalciferol (VITAMIN D2) 1.25 MG (50000 UT) capsule Take 50,000 Units by mouth every Saturday. 01/12/21   [provider]  ferrous sulfate 325 (65 FE) MG tablet Take 325 mg by mouth daily with breakfast.    [provider]  FLUoxetine (PROZAC) 20 MG capsule Take 1 capsule (20 mg total) by mouth 2 (two) times daily. 04/20/18   Weber, Dema Severin, PA-C  furosemide (LASIX) 20 MG tablet Take 2 tablets (40 mg total) by mouth daily. 05/23/18 11/01/21  Valarie Cones, Dema Severin, PA-C  gemfibrozil (LOPID) 600 MG tablet  Take 1 tablet (600 mg total) by mouth 2 (two) times daily before a meal. 04/18/18   Weber, Dema Severin, PA-C  Insulin Glargine (TOUJEO SOLOSTAR) 300 UNIT/ML SOPN Inject 50 Units into the skin 2 (two) times daily. 05/23/18   Valarie Cones, Dema Severin, PA-C  metoprolol tartrate (LOPRESSOR) 50 MG tablet Take 1 tablet (50 mg total) by mouth 2 (two) times daily. 04/20/18   Weber, Dema Severin, PA-C  MOUNJARO 7.5 MG/0.5ML Pen Inject into the skin.    [provider]  naloxone [facility]) nasal spray 4 mg/0.1 mL Place 0.4 mg into the  nose as needed (accidental overdose). 10/30/19   [provider]  naproxen (NAPROSYN) 375 MG tablet Take 1 tablet (375 mg total) by mouth 2 (two) times daily. 12/24/22   Linwood Dibbles, MD  NOVOTWIST 32G X 5 MM MISC USE 1 APPLICATION BY DOES NOT APPLY ROUTE DAILY. 04/20/18   Weber, Dema Severin, PA-C  pantoprazole (PROTONIX) 40 MG tablet Take 40 mg by mouth 2 (two) times daily. 05/26/21   [provider]  potassium chloride SA (K-DUR,KLOR-CON) 20 MEQ tablet Take 2 tablets (40 mEq total) by mouth daily. 04/20/18   Weber, Dema Severin, PA-C  progesterone (PROMETRIUM) 200 MG capsule Take 200 mg by mouth at bedtime. 10/09/20   [provider]  Semaglutide, 2 MG/DOSE, (OZEMPIC, 2 MG/DOSE,) 8 MG/3ML SOPN Inject 2 mg into the skin every Wednesday. 01/26/21   [provider]  tamsulosin (FLOMAX) 0.4 MG CAPS capsule Take 1 capsule (0.4 mg total) by mouth daily. 10/20/21   Milagros Loll, MD  vitamin B-12 (CYANOCOBALAMIN) 1000 MCG tablet Take 1,000 mcg by mouth daily.    [provider]  Ranitidine HCl (ZANTAC PO) Take 1 tablet by mouth 2 (two) times daily. Patient uses this medication for heartburn.  06/03/13  [provider]      Allergies    Contrast media [iodinated contrast media], Darvocet [propoxyphene n-acetaminophen], Lisinopril, Metformin and related, Morphine and codeine, Percocet [oxycodone-acetaminophen], Robaxin [methocarbamol], Wellbutrin [bupropion hcl], and Zofran [ondansetron hcl]    Review of Systems   Review of Systems  Gastrointestinal:  Positive for abdominal pain.  All other systems reviewed and are negative.   Physical Exam Updated Vital Signs BP (!) 152/68 (BP Location: Right Arm)   Pulse 85   Temp 97.8 F (36.6 C)   Resp 16   Wt 69.4 kg   LMP  (LMP Unknown)   SpO2 98%   BMI 28.91 kg/m  Physical Exam Vitals and nursing note reviewed.  Constitutional:      General: She is not in acute distress.    Appearance: She is well-developed.   HENT:     Head: Normocephalic and atraumatic.  Eyes:     Conjunctiva/sclera: Conjunctivae normal.  Cardiovascular:     Rate and Rhythm: Normal rate and regular rhythm.     Heart sounds: No murmur heard. Pulmonary:     Effort: Pulmonary effort is normal. No respiratory distress.     Breath sounds: Normal breath sounds.  Abdominal:     Palpations: Abdomen is soft.     Tenderness: There is abdominal tenderness in the left lower quadrant. There is no right CVA tenderness or left CVA tenderness.  Musculoskeletal:        General: No swelling.     Cervical back: Neck supple.  Skin:    General: Skin is warm and dry.     Capillary Refill: Capillary refill takes less than 2 seconds.  Neurological:  Mental Status: She is alert.  Psychiatric:        Mood and Affect: Mood normal.     ED Results / Procedures / Treatments   Labs (all labs ordered are listed, but only abnormal results are displayed) Labs Reviewed  COMPREHENSIVE METABOLIC PANEL - Abnormal; Notable for the following components:      Result Value   Glucose, Bld 204 (*)    BUN 22 (*)    All other components within normal limits  CBC - Abnormal; Notable for the following components:   WBC 12.3 (*)    All other components within normal limits  URINALYSIS, ROUTINE W REFLEX MICROSCOPIC - Abnormal; Notable for the following components:   Hgb urine dipstick TRACE (*)    All other components within normal limits  LIPASE, BLOOD    EKG None  Radiology CT Renal Stone Study Result Date: 11/22/2023 CLINICAL DATA:  Abdominal/flank pain, stone suspected. Left lower quadrant pain with urinary frequency. EXAM: CT ABDOMEN AND PELVIS WITHOUT CONTRAST TECHNIQUE: Multidetector CT imaging of the abdomen and pelvis was performed following the standard protocol without IV contrast. RADIATION DOSE REDUCTION: This exam was performed according to the departmental dose-optimization program which includes automated exposure control, adjustment  of the mA and/or kV according to patient size and/or use of iterative reconstruction technique. COMPARISON:  11/01/2021. FINDINGS: Lower chest: No acute abnormality. Hepatobiliary: No focal liver abnormality is seen. Fatty infiltration of the liver is noted. Status post cholecystectomy. No biliary dilatation. Pancreas: Unremarkable. No pancreatic ductal dilatation or surrounding inflammatory changes. Spleen: Normal in size without focal abnormality. Adrenals/Urinary Tract: The adrenal glands are within normal limits. A nonobstructive renal calculus is present on the right. No ureteral calculus or obstructive uropathy bilaterally. Stable phleboliths are present in the pelvis. The bladder is unremarkable. Stomach/Bowel: Stomach is within normal limits. No bowel obstruction, free air, or pneumatosis is seen. Scattered diverticula are present along the colon. There is bowel wall thickening with mild surrounding fat stranding involving the distal descending colon, compatible with diverticulitis. No abscess is seen. Right hemicolectomy changes are noted. Vascular/Lymphatic: Aortic atherosclerosis. No enlarged abdominal or pelvic lymph nodes. Reproductive: The uterine fundus has a lobular contour suggesting underlying fibroids. No adnexal mass. Other: No abdominopelvic ascites. A fat containing umbilical hernia is present. Musculoskeletal: Degenerative changes are noted in the thoracolumbar spine and at the hips bilaterally. No acute osseous abnormality. IMPRESSION: 1. Findings compatible with diverticulitis involving the distal descending colon. 2. Right renal calculus. No evidence of obstructive uropathy bilaterally. 3. Hepatic steatosis. 4. Aortic atherosclerosis. Electronically Signed   By: Thornell Sartorius M.D.   On: 11/22/2023 14:30    Procedures Procedures    Medications Ordered in ED Medications  fentaNYL (SUBLIMAZE) injection 25 mcg (25 mcg Intravenous Given 11/22/23 1420)  metoCLOPramide (REGLAN) injection  10 mg (10 mg Intravenous Given 11/22/23 1422)    ED Course/ Medical Decision Making/ A&P                                 Medical Decision Making Amount and/or Complexity of Data Reviewed Labs: ordered. Radiology: ordered.  Risk Prescription drug management.   This patient presents to the ED for concern of abdominal pain, this involves an extensive number of treatment options, and is a complaint that carries with it a high risk of complications and morbidity.  The differential diagnosis includes initial is, PUD, cholecystitis, SBO/LBO, volvulus, diverticulitis, appendicitis, pyonephritis, nephrolithiasis,  cystitis   Co morbidities that complicate the patient evaluation  See HPI   Additional history obtained:  Additional history obtained from EMR External records from outside source obtained and reviewed including hospital records   Lab Tests:  I Ordered, and personally interpreted labs.  The pertinent results include: Leukocyte this is a 12.3.  No evidence of anemia.  Platelets within range.  No Electra abnormalities.  No transaminitis.  No renal dysfunction.  UA with trace hemoglobin otherwise unremarkable.   Imaging Studies ordered:  I ordered imaging studies including CT renal stone study I independently visualized and interpreted imaging which showed diverticulitis.  Right renal calculus nonobstructing.  Hepatic steatosis.  Aortic atherosclerosis. I agree with the radiologist interpretation  Cardiac Monitoring: / EKG:  The patient was maintained on a cardiac monitor.  I personally viewed and interpreted the cardiac monitored which showed an underlying rhythm of: Sinus rhythm   Consultations Obtained:  N/a   Problem List / ED Course / Critical interventions / Medication management  Left lower quadrant abdominal pain, diverticulitis I ordered medication including going, fentanyl  Reevaluation of the patient after these medicines showed that the patient  improved I have reviewed the patients home medicines and have made adjustments as needed   Social Determinants of Health:  Former cigarette use.  Denies illicit drug use.   Test / Admission - Considered:  Left lower quadrant abdominal pain, diverticulitis Vitals signs significant for hypertension blood pressure with 152/60. Otherwise within normal range and stable throughout visit. Laboratory/imaging studies significant for: See above 58 year old female presents emergency department complaints of left lower quad abdominal pain.  Onset around 3 AM this morning.  History of kidney stones and states that her symptoms felt similar to other kidney stone in the past.  On exam, focal tenderness left lower quadrant of abdomen.  Laboratory studies reassuring for any acute emergent process.  Slight leukocytosis of 12.3.  CT imaging concerning for uncomplicated diverticulitis.  Suspect that this has been a clinical cause of patient's symptoms.  Recommend dietary changes as well as will place patient empirically on antibiotics for treatment of diverticulitis.  Will recommend follow-up with PCP in the outpatient setting for reassessment.  Treatment plan discussed length with patient and she acknowledged understanding was agreeable to said plan.  Patient will well-appearing, afebrile in no acute distress. Worrisome signs and symptoms were discussed with the patient, and the patient acknowledged understanding to return to the ED if noticed. Patient was stable upon discharge.          Final Clinical Impression(s) / ED Diagnoses Final diagnoses:  Diverticulitis    Rx / DC Orders      Peter Garter, PA 11/22/23 1524    Margarita Grizzle, MD 12/02/23 1558

## 2023-11-22 NOTE — Discharge Instructions (Addendum)
 As discussed, CT imaging showed evidence of diverticulitis.  Will treat this with dietary changes as above as well as antibiotics.  Patient on 2 different antibiotics for treatment of this.  Recommend following up with your primary care for reassessment of your symptoms.  If you develop worsening pain, fever, intractable nausea/vomiting, please return to emergency department.  Otherwise, we will manage her symptoms in the outpatient setting.

## 2023-11-22 NOTE — ED Triage Notes (Signed)
 Pt c/o LLQ pain radiating to LLE since 0300 with urinary frequency. Hx of kidney stones. 5mg  hydocone ~ 1.5 hr pta

## 2023-11-29 ENCOUNTER — Other Ambulatory Visit: Payer: Self-pay

## 2023-11-29 ENCOUNTER — Emergency Department (HOSPITAL_BASED_OUTPATIENT_CLINIC_OR_DEPARTMENT_OTHER)

## 2023-11-29 ENCOUNTER — Emergency Department (HOSPITAL_BASED_OUTPATIENT_CLINIC_OR_DEPARTMENT_OTHER)
Admission: EM | Admit: 2023-11-29 | Discharge: 2023-11-29 | Disposition: A | Discharge status: Home / Self Care | Attending: Emergency Medicine | Admitting: Emergency Medicine

## 2023-11-29 DIAGNOSIS — J101 Influenza due to other identified influenza virus with other respiratory manifestations: Secondary | ICD-10-CM | POA: Diagnosis not present

## 2023-11-29 DIAGNOSIS — R059 Cough, unspecified: Secondary | ICD-10-CM | POA: Diagnosis present

## 2023-11-29 DIAGNOSIS — R000 Tachycardia, unspecified: Secondary | ICD-10-CM | POA: Insufficient documentation

## 2023-11-29 DIAGNOSIS — J111 Influenza due to unidentified influenza virus with other respiratory manifestations: Secondary | ICD-10-CM

## 2023-11-29 LAB — RESP PANEL BY RT-PCR (RSV, FLU A&B, COVID)  RVPGX2
Influenza A by PCR: POSITIVE — AB
Influenza B by PCR: NEGATIVE
Resp Syncytial Virus by PCR: NEGATIVE
SARS Coronavirus 2 by RT PCR: NEGATIVE

## 2023-11-29 LAB — CBG MONITORING, ED: Glucose-Capillary: 239 mg/dL — ABNORMAL HIGH (ref 70–99)

## 2023-11-29 MED ORDER — ACETAMINOPHEN 325 MG PO TABS
650.0000 mg | ORAL_TABLET | Freq: Once | ORAL | Status: AC
  Administered 2023-11-29: 650 mg via ORAL
  Filled 2023-11-29: qty 2

## 2023-11-29 MED ORDER — OXYMETAZOLINE HCL 0.05 % NA SOLN
2.0000 | Freq: Once | NASAL | Status: AC
  Administered 2023-11-29: 2 via NASAL
  Filled 2023-11-29: qty 30

## 2023-11-29 MED ORDER — ALBUTEROL SULFATE (2.5 MG/3ML) 0.083% IN NEBU
INHALATION_SOLUTION | RESPIRATORY_TRACT | Status: AC
  Administered 2023-11-29: 2.5 mg
  Filled 2023-11-29: qty 3

## 2023-11-29 MED ORDER — INSULIN ASPART 100 UNIT/ML IJ SOLN
5.0000 [IU] | Freq: Once | INTRAMUSCULAR | Status: AC
  Administered 2023-11-29: 5 [IU] via SUBCUTANEOUS

## 2023-11-29 MED ORDER — GUAIFENESIN-CODEINE 100-10 MG/5ML PO SOLN
10.0000 mL | Freq: Once | ORAL | Status: AC
  Administered 2023-11-29: 10 mL via ORAL
  Filled 2023-11-29: qty 10

## 2023-11-29 MED ORDER — ONDANSETRON HCL 4 MG PO TABS: 4.0000 mg | ORAL_TABLET | Freq: Four times a day (QID) | ORAL | 0 refills | Status: AC

## 2023-11-29 MED ORDER — ONDANSETRON HCL 4 MG/2ML IJ SOLN
4.0000 mg | Freq: Once | INTRAMUSCULAR | Status: AC
  Administered 2023-11-29: 4 mg via INTRAVENOUS
  Filled 2023-11-29: qty 2

## 2023-11-29 MED ORDER — IPRATROPIUM-ALBUTEROL 0.5-2.5 (3) MG/3ML IN SOLN
RESPIRATORY_TRACT | Status: AC
  Administered 2023-11-29: 3 mL
  Filled 2023-11-29: qty 3

## 2023-11-29 MED ORDER — METHYLPREDNISOLONE SODIUM SUCC 125 MG IJ SOLR
125.0000 mg | Freq: Once | INTRAMUSCULAR | Status: AC
  Administered 2023-11-29: 125 mg via INTRAVENOUS
  Filled 2023-11-29: qty 2

## 2023-11-29 MED ORDER — SODIUM CHLORIDE 0.9 % IV BOLUS
1000.0000 mL | Freq: Once | INTRAVENOUS | Status: AC
  Administered 2023-11-29: 1000 mL via INTRAVENOUS

## 2023-11-29 MED ORDER — PREDNISONE 20 MG PO TABS: 20.0000 mg | ORAL_TABLET | Freq: Every day | ORAL | 0 refills | Status: AC

## 2023-11-29 NOTE — ED Triage Notes (Signed)
 C/o cough, fever, abdominal pain since Saturday. Also c/o shortness of breath, hx of asthma.  RT in triage to assess.

## 2023-11-29 NOTE — ED Notes (Signed)
 Pt reports taking 600mg  ibuprofen just prior to arrival

## 2023-11-29 NOTE — Discharge Instructions (Addendum)
 Rest, increase hydration.  Prescription for Zofran sent to pharmacy for nausea.  Patient is safe to take Motrin 600 mg every 8 hours as needed for body aches, fevers, chills.You can also take Tylenol 650 mg every 8 hours and, nation with this medication safely.  You can use Afrin nasal spray for nasal congestion.  2 sprays in each nare twice a day.  Stop use after 5 days to help prevent rebound congestion.  Steroids sent to pharmacy for wheezing.  Use your albuterol every 4 hours as needed.

## 2023-11-29 NOTE — ED Provider Notes (Signed)
 [place] Provider Note   CSN: [id number] Arrival date & time: 11/29/23  1557     History  Chief Complaint  Patient presents with   Shortness of Breath    [name] is a 58 y.o. female.  Patient is a 58 year old female presenting for fevers, body aches, chills, nasal congestion, coughing, and wheezing.  The history is provided by the patient. No language interpreter was used.  Shortness of Breath Associated symptoms: cough, fever, headaches and wheezing   Associated symptoms: no abdominal pain, no chest pain, no ear pain, no rash, no sore throat and no vomiting        Home Medications Prior to Admission medications   Medication Sig Start Date End Date Taking? Authorizing Provider  ondansetron (ZOFRAN) 4 MG tablet Take 1 tablet (4 mg total) by mouth every 6 (six) hours. 11/29/23  Yes Edwin Dada P, DO  predniSONE (DELTASONE) 20 MG tablet Take 1 tablet (20 mg total) by mouth daily for 5 days. 11/29/23 12/04/23 Yes Edwin Dada P, DO  albuterol (PROVENTIL HFA;VENTOLIN HFA) 108 (90 Base) MCG/ACT inhaler Inhale 2 puffs into the lungs every 6 (six) hours as needed for wheezing or shortness of breath. 02/28/18   Albertine Grates, MD  allopurinol (ZYLOPRIM) 100 MG tablet Take 100 mg by mouth daily. 11/29/22   [provider]  atorvastatin (LIPITOR) 40 MG tablet Take 1 tablet (40 mg total) by mouth daily. 04/24/18   Weber, Dema Severin, PA-C  BIOTIN PO Take 1 capsule by mouth daily.    [provider]  blood glucose meter kit and supplies KIT by Does not apply route in the morning and at bedtime.    [provider]  celecoxib (CELEBREX) 200 MG capsule Take 200 mg by mouth daily.    [provider]  ciprofloxacin (CIPRO) 500 MG tablet Take 1 tablet (500 mg total) by mouth 2 (two) times daily. 11/22/23   Peter Garter, PA  Continuous Blood Gluc Sensor (FREESTYLE LIBRE 14 DAY SENSOR) MISC 1 application by Does not apply  route every 14 (fourteen) days. 04/20/18   Weber, Dema Severin, PA-C  diazepam (VALIUM) 2 MG tablet Take 0.5-1 tablets (1-2 mg total) every 12 (twelve) hours as needed by mouth for anxiety or muscle spasms. 08/16/17   McVey, Madelaine Bhat, PA-C  diclofenac Sodium (VOLTAREN) 1 % GEL Apply 2 g topically 4 (four) times daily as needed. Patient taking differently: Apply 2 g topically 4 (four) times daily as needed (stomach pain). 03/12/21   Long, Arlyss Repress, MD  ergocalciferol (VITAMIN D2) 1.25 MG (50000 UT) capsule Take 50,000 Units by mouth every Saturday. 01/12/21   [provider]  ferrous sulfate 325 (65 FE) MG tablet Take 325 mg by mouth daily with breakfast.    [provider]  FLUoxetine (PROZAC) 20 MG capsule Take 1 capsule (20 mg total) by mouth 2 (two) times daily. 04/20/18   Weber, Dema Severin, PA-C  furosemide (LASIX) 20 MG tablet Take 2 tablets (40 mg total) by mouth daily. 05/23/18 11/01/21  Valarie Cones, Dema Severin, PA-C  gemfibrozil (LOPID) 600 MG tablet Take 1 tablet (600 mg total) by mouth 2 (two) times daily before a meal. 04/18/18   Weber, Dema Severin, PA-C  Insulin Glargine (TOUJEO SOLOSTAR) 300 UNIT/ML SOPN Inject 50 Units into the skin 2 (two) times daily. 05/23/18   Weber, Dema Severin, PA-C  metoCLOPramide (REGLAN) 10 MG tablet Take 1 tablet (10 mg total) by mouth  every 8 (eight) hours as needed for nausea or vomiting. 11/22/23   Sherian Maroon A, PA  metoprolol tartrate (LOPRESSOR) 50 MG tablet Take 1 tablet (50 mg total) by mouth 2 (two) times daily. 04/20/18   Valarie Cones, Dema Severin, PA-C  metroNIDAZOLE (FLAGYL) 500 MG tablet Take 1 tablet (500 mg total) by mouth 3 (three) times daily. 11/22/23   Peter Garter, PA  MOUNJARO 7.5 MG/0.5ML Pen Inject into the skin.    [provider]  naloxone [facility]) nasal spray 4 mg/0.1 mL Place 0.4 mg into the nose as needed (accidental overdose). 10/30/19   [provider]  naproxen (NAPROSYN) 375 MG tablet Take 1 tablet (375 mg total) by mouth 2  (two) times daily. 12/24/22   Linwood Dibbles, MD  NOVOTWIST 32G X 5 MM MISC USE 1 APPLICATION BY DOES NOT APPLY ROUTE DAILY. 04/20/18   Weber, Dema Severin, PA-C  pantoprazole (PROTONIX) 40 MG tablet Take 40 mg by mouth 2 (two) times daily. 05/26/21   [provider]  potassium chloride SA (K-DUR,KLOR-CON) 20 MEQ tablet Take 2 tablets (40 mEq total) by mouth daily. 04/20/18   Weber, Dema Severin, PA-C  progesterone (PROMETRIUM) 200 MG capsule Take 200 mg by mouth at bedtime. 10/09/20   [provider]  promethazine (PHENERGAN) 25 MG suppository Place 1 suppository (25 mg total) rectally every 6 (six) hours as needed for nausea or vomiting. 11/22/23   Peter Garter, PA  Semaglutide, 2 MG/DOSE, (OZEMPIC, 2 MG/DOSE,) 8 MG/3ML SOPN Inject 2 mg into the skin every Wednesday. 01/26/21   [provider]  tamsulosin (FLOMAX) 0.4 MG CAPS capsule Take 1 capsule (0.4 mg total) by mouth daily. 10/20/21   Milagros Loll, MD  vitamin B-12 (CYANOCOBALAMIN) 1000 MCG tablet Take 1,000 mcg by mouth daily.    [provider]  Ranitidine HCl (ZANTAC PO) Take 1 tablet by mouth 2 (two) times daily. Patient uses this medication for heartburn.  06/03/13  [provider]      Allergies    Contrast media [iodinated contrast media], Darvocet [propoxyphene n-acetaminophen], Lisinopril, Metformin and related, Percocet [oxycodone-acetaminophen], Robaxin [methocarbamol], Wellbutrin [bupropion hcl], and Zofran [ondansetron hcl]    Review of Systems   Review of Systems  Constitutional:  Positive for chills and fever.  HENT:  Positive for congestion. Negative for ear pain and sore throat.   Eyes:  Negative for pain and visual disturbance.  Respiratory:  Positive for cough, shortness of breath and wheezing.   Cardiovascular:  Negative for chest pain and palpitations.  Gastrointestinal:  Negative for abdominal pain and vomiting.  Genitourinary:  Negative for dysuria and hematuria.  Musculoskeletal:   Negative for arthralgias and back pain.  Skin:  Negative for color change and rash.  Neurological:  Positive for headaches. Negative for seizures and syncope.  All other systems reviewed and are negative.   Physical Exam Updated Vital Signs BP (!) 153/72   Pulse 100   Temp 100.1 F (37.8 C)   Resp 19   LMP  (LMP Unknown)   SpO2 94%  Physical Exam Vitals and nursing note reviewed.  Constitutional:      General: She is not in acute distress.    Appearance: She is well-developed.  HENT:     Head: Normocephalic and atraumatic.  Eyes:     Conjunctiva/sclera: Conjunctivae normal.  Cardiovascular:     Rate and Rhythm: Regular rhythm. Tachycardia present.     Heart sounds: No murmur heard. Pulmonary:  Effort: Pulmonary effort is normal. Tachypnea present. No respiratory distress.     Breath sounds: Normal breath sounds.  Abdominal:     Palpations: Abdomen is soft.     Tenderness: There is no abdominal tenderness.  Musculoskeletal:        General: No swelling.     Cervical back: Neck supple.  Skin:    General: Skin is warm and dry.     Capillary Refill: Capillary refill takes less than 2 seconds.  Neurological:     Mental Status: She is alert.  Psychiatric:        Mood and Affect: Mood normal.     ED Results / Procedures / Treatments   Labs (all labs ordered are listed, but only abnormal results are displayed) Labs Reviewed  RESP PANEL BY RT-PCR (RSV, FLU A&B, COVID)  RVPGX2 - Abnormal; Notable for the following components:      Result Value   Influenza A by PCR POSITIVE (*)    All other components within normal limits  CBG MONITORING, ED - Abnormal; Notable for the following components:   Glucose-Capillary 239 (*)    All other components within normal limits    EKG EKG Interpretation Date/Time:  Tuesday November 29 2023 16:09:49 EST Ventricular Rate:  120 PR Interval:  136 QRS Duration:  72 QT Interval:  318 QTC Calculation: 449 R Axis:   131  Text  Interpretation: Sinus tachycardia Left posterior fascicular block Possible Anterior infarct , age undetermined Abnormal ECG When compared with ECG of 01-Dec-2022 07:49, PREVIOUS ECG IS PRESENT Confirmed by Edwin Dada ([id number]) on 11/29/2023 10:02:31 PM  Radiology DG Chest 2 View Result Date: 11/29/2023 CLINICAL DATA:  Cough, fever, abdominal pain EXAM: CHEST - 2 VIEW COMPARISON:  12/01/2022 FINDINGS: Stable cardiomediastinal silhouette. Aortic atherosclerotic calcification. No focal consolidation, pleural effusion, or pneumothorax. No displaced rib fractures. IMPRESSION: No acute cardiopulmonary disease. Electronically Signed   By: Minerva Fester M.D.   On: 11/29/2023 18:57    Procedures Procedures    Medications Ordered in ED Medications  ipratropium-albuterol (DUONEB) 0.5-2.5 (3) MG/3ML nebulizer solution (3 mLs  Given 11/29/23 1612)  albuterol (PROVENTIL) (2.5 MG/3ML) 0.083% nebulizer solution (2.5 mg  Given 11/29/23 1612)  acetaminophen (TYLENOL) tablet 650 mg (650 mg Oral Given 11/29/23 1825)  ondansetron (ZOFRAN) injection 4 mg (4 mg Intravenous Given 11/29/23 1825)  sodium chloride 0.9 % bolus 1,000 mL (0 mLs Intravenous Stopped 11/29/23 1937)  oxymetazoline (AFRIN) 0.05 % nasal spray 2 spray (2 sprays Each Nare Given 11/29/23 1825)  guaiFENesin-codeine 100-10 MG/5ML solution 10 mL (10 mLs Oral Given 11/29/23 1825)  methylPREDNISolone sodium succinate (SOLU-MEDROL) 125 mg/2 mL injection 125 mg (125 mg Intravenous Given 11/29/23 1825)  insulin aspart (novoLOG) injection 5 Units (5 Units Subcutaneous Given 11/29/23 1925)    ED Course/ Medical Decision Making/ A&P                                 Medical Decision Making Amount and/or Complexity of Data Reviewed Radiology: ordered.  Risk OTC drugs. Prescription drug management.    58 year old female presenting for fevers, body aches, chills, nasal congestion, coughing, and wheezing.  Patient is alert and oriented x 3, no acute distress, febrile at  100.1 F, tachycardia 123, and tachypneic at 22 breaths/min.  She is equal bilateral breath sounds on my also rotation but was said to be wheezing in all lung fields on evaluation in triage.  She was given a DuoNeb and a repeat albuterol dose by our respiratory therapy team.  At this time she admits to significant improvement in breathing however has increased coughing, headache, chills, nasal congestion.  Will give medication for therapeutic management.  Influenza A positive.  Tamiflu offered and declined..  Patient had improvement of symptoms and tachycardia after treatment therapies.  Patient in no distress and overall condition improved here in the ED. Detailed discussions were had with the patient regarding current findings, and need for close f/u with PCP or on call doctor. The patient has been instructed to return immediately if the symptoms worsen in any way for re-evaluation. Patient verbalized understanding and is in agreement with current care plan. All questions answered prior to discharge.        Final Clinical Impression(s) / ED Diagnoses Final diagnoses:  Influenza    Rx / DC Orders ED Discharge Orders          Ordered    ondansetron (ZOFRAN) 4 MG tablet  Every 6 hours        11/29/23 1923    predniSONE (DELTASONE) 20 MG tablet  Daily        11/29/23 1923              Franne Forts, DO 11/29/23 2203

## 2024-10-04 ENCOUNTER — Encounter (HOSPITAL_BASED_OUTPATIENT_CLINIC_OR_DEPARTMENT_OTHER): Payer: Self-pay

## 2024-10-04 ENCOUNTER — Emergency Department (HOSPITAL_BASED_OUTPATIENT_CLINIC_OR_DEPARTMENT_OTHER)

## 2024-10-04 ENCOUNTER — Emergency Department (HOSPITAL_BASED_OUTPATIENT_CLINIC_OR_DEPARTMENT_OTHER)
Admission: EM | Admit: 2024-10-04 | Discharge: 2024-10-04 | Disposition: A | Discharge status: Home / Self Care | Attending: Emergency Medicine | Admitting: Emergency Medicine

## 2024-10-04 ENCOUNTER — Other Ambulatory Visit: Payer: Self-pay

## 2024-10-04 DIAGNOSIS — N132 Hydronephrosis with renal and ureteral calculous obstruction: Secondary | ICD-10-CM | POA: Diagnosis not present

## 2024-10-04 DIAGNOSIS — R1013 Epigastric pain: Secondary | ICD-10-CM | POA: Diagnosis present

## 2024-10-04 DIAGNOSIS — I10 Essential (primary) hypertension: Secondary | ICD-10-CM | POA: Insufficient documentation

## 2024-10-04 DIAGNOSIS — Z79899 Other long term (current) drug therapy: Secondary | ICD-10-CM | POA: Insufficient documentation

## 2024-10-04 DIAGNOSIS — N200 Calculus of kidney: Secondary | ICD-10-CM

## 2024-10-04 LAB — URINALYSIS, ROUTINE W REFLEX MICROSCOPIC
Bacteria, UA: NONE SEEN
Bilirubin Urine: NEGATIVE
Glucose, UA: 100 mg/dL — AB
Ketones, ur: NEGATIVE mg/dL
Leukocytes,Ua: NEGATIVE
Nitrite: NEGATIVE
Protein, ur: NEGATIVE mg/dL
Specific Gravity, Urine: 1.01 (ref 1.005–1.030)
pH: 5.5 (ref 5.0–8.0)

## 2024-10-04 LAB — COMPREHENSIVE METABOLIC PANEL WITH GFR
ALT: 30 U/L (ref 0–44)
AST: 21 U/L (ref 15–41)
Albumin: 4.4 g/dL (ref 3.5–5.0)
Alkaline Phosphatase: 123 U/L (ref 38–126)
Anion gap: 15 (ref 5–15)
BUN: 24 mg/dL — ABNORMAL HIGH (ref 6–20)
CO2: 22 mmol/L (ref 22–32)
Calcium: 10.2 mg/dL (ref 8.9–10.3)
Chloride: 102 mmol/L (ref 98–111)
Creatinine, Ser: 0.93 mg/dL (ref 0.44–1.00)
GFR, Estimated: >60 mL/min
Glucose, Bld: 227 mg/dL — ABNORMAL HIGH (ref 70–99)
Potassium: 4 mmol/L (ref 3.5–5.1)
Sodium: 140 mmol/L (ref 135–145)
Total Bilirubin: 0.5 mg/dL (ref 0.0–1.2)
Total Protein: 7.2 g/dL (ref 6.5–8.1)

## 2024-10-04 LAB — LIPASE, BLOOD: Lipase: 99 U/L — ABNORMAL HIGH (ref 11–51)

## 2024-10-04 LAB — CBC
HCT: 47.9 % — ABNORMAL HIGH (ref 36.0–46.0)
Hemoglobin: 16.7 g/dL — ABNORMAL HIGH (ref 12.0–15.0)
MCH: 30.5 pg (ref 26.0–34.0)
MCHC: 34.9 g/dL (ref 30.0–36.0)
MCV: 87.4 fL (ref 80.0–100.0)
Platelets: 282 K/uL (ref 150–400)
RBC: 5.48 MIL/uL — ABNORMAL HIGH (ref 3.87–5.11)
RDW: 13.2 % (ref 11.5–15.5)
WBC: 9.4 K/uL (ref 4.0–10.5)
nRBC: 0 % (ref 0.0–0.2)

## 2024-10-04 LAB — TROPONIN T, HIGH SENSITIVITY: Troponin T High Sensitivity: 19 ng/L (ref 0–19)

## 2024-10-04 MED ORDER — OXYCODONE HCL 5 MG PO TABS
10.0000 mg | ORAL_TABLET | Freq: Once | ORAL | Status: AC
  Administered 2024-10-04: 10 mg via ORAL
  Filled 2024-10-04: qty 2

## 2024-10-04 MED ORDER — ONDANSETRON 4 MG PO TBDP: 4.0000 mg | ORAL_TABLET | Freq: Three times a day (TID) | ORAL | 0 refills | Status: AC | PRN

## 2024-10-04 MED ORDER — KETOROLAC TROMETHAMINE 30 MG/ML IJ SOLN
30.0000 mg | Freq: Once | INTRAMUSCULAR | Status: AC
  Administered 2024-10-04: 30 mg via INTRAVENOUS
  Filled 2024-10-04: qty 1

## 2024-10-04 MED ORDER — IBUPROFEN 400 MG PO TABS: 400.0000 mg | ORAL_TABLET | Freq: Three times a day (TID) | ORAL | 0 refills | Status: AC

## 2024-10-04 MED ORDER — LACTATED RINGERS IV BOLUS
1000.0000 mL | Freq: Once | INTRAVENOUS | Status: AC
  Administered 2024-10-04: 1000 mL via INTRAVENOUS

## 2024-10-04 MED ORDER — MORPHINE SULFATE 30 MG PO TABS: 15.0000 mg | ORAL_TABLET | ORAL | 0 refills | Status: AC | PRN

## 2024-10-04 MED ORDER — DIPHENHYDRAMINE HCL 50 MG/ML IJ SOLN
25.0000 mg | Freq: Once | INTRAMUSCULAR | Status: AC
  Administered 2024-10-04: 25 mg via INTRAVENOUS
  Filled 2024-10-04: qty 1

## 2024-10-04 MED ORDER — HYDROMORPHONE HCL 1 MG/ML IJ SOLN
1.0000 mg | Freq: Once | INTRAMUSCULAR | Status: AC
  Administered 2024-10-04: 1 mg via INTRAVENOUS
  Filled 2024-10-04: qty 1

## 2024-10-04 MED ORDER — PROMETHAZINE (PHENERGAN) 6.25MG IN NS 50ML IVPB
6.2500 mg | Freq: Four times a day (QID) | INTRAVENOUS | Status: DC | PRN
  Filled 2024-10-04: qty 50

## 2024-10-04 MED ORDER — IOHEXOL 350 MG/ML SOLN
100.0000 mL | Freq: Once | INTRAVENOUS | Status: AC | PRN
  Administered 2024-10-04: 100 mL via INTRAVENOUS

## 2024-10-04 MED ORDER — TAMSULOSIN HCL 0.4 MG PO CAPS: 0.4000 mg | ORAL_CAPSULE | Freq: Every day | ORAL | 0 refills | Status: AC

## 2024-10-04 MED ORDER — MORPHINE SULFATE 15 MG PO TABS: 15.0000 mg | ORAL_TABLET | ORAL | Status: DC | PRN

## 2024-10-04 NOTE — ED Triage Notes (Signed)
 Pt c/o centralized abd pain for a couple days, felt like I was really bloated, then it started hurting really bad, it just hurts all over, through to my back & wraps around the side of the belly. Advises pain is sharp, squeezing. Endorses associated nausea very badly since pain onset. Pt visibly uncomfortable in triage, guarding abd & unable to sit still for long periods of time.

## 2024-10-04 NOTE — ED Provider Notes (Signed)
 " [place] [facility] AT [facility] Provider Note   CSN: [id number] Arrival date & time: 10/04/24  0123     Patient presents with: Abdominal Pain, Chest Pain, and Back Pain   [name] is a 59 y.o. female.   59 yo F here with severe abdominal pain in the epigastric area and radiating towards the back associated with nausea. H/o HTN, compliant with medications. No neuro symptoms. No fever. H/o cholecystectomy many years ago.    Abdominal Pain Associated symptoms: chest pain   Chest Pain Associated symptoms: abdominal pain and back pain   Back Pain Associated symptoms: abdominal pain and chest pain        Prior to Admission medications  Medication Sig Start Date End Date Taking? Authorizing Provider  ibuprofen  (ADVIL ) 400 MG tablet Take 1 tablet (400 mg total) by mouth 3 (three) times daily for 7 days. 10/04/24 10/11/24 Yes Gaudencio Chesnut, Selinda, MD  morphine  (MSIR) 30 MG tablet Take 0.5 tablets (15 mg total) by mouth every 4 (four) hours as needed for severe pain (pain score 7-10). 10/04/24  Yes Teniola Tseng, Selinda, MD  ondansetron  (ZOFRAN -ODT) 4 MG disintegrating tablet Take 1 tablet (4 mg total) by mouth every 8 (eight) hours as needed for vomiting. 10/04/24  Yes Adelyna Brockman, Selinda, MD  tamsulosin  (FLOMAX ) 0.4 MG CAPS capsule Take 1 capsule (0.4 mg total) by mouth daily. Until stone passes 10/04/24  Yes Tanisha Lutes, Selinda, MD  albuterol  (PROVENTIL  HFA;VENTOLIN  HFA) 108 (90 Base) MCG/ACT inhaler Inhale 2 puffs into the lungs every 6 (six) hours as needed for wheezing or shortness of breath. 02/28/18   Jerri Keys, MD  allopurinol (ZYLOPRIM) 100 MG tablet Take 100 mg by mouth daily. 11/29/22   [provider]  atorvastatin  (LIPITOR) 40 MG tablet Take 1 tablet (40 mg total) by mouth daily. 04/24/18   Weber, Lauraine CROME, PA-C  BIOTIN PO Take 1 capsule by mouth daily.    [provider]  blood glucose meter kit and supplies KIT by Does not apply route in the morning and at bedtime.     [provider]  Continuous Blood Gluc Sensor (FREESTYLE LIBRE 14 DAY SENSOR) MISC 1 application by Does not apply route every 14 (fourteen) days. 04/20/18   Weber, Lauraine CROME, PA-C  diazepam  (VALIUM ) 2 MG tablet Take 0.5-1 tablets (1-2 mg total) every 12 (twelve) hours as needed by mouth for anxiety or muscle spasms. 08/16/17   McVey, Almarie Folks, PA-C  ergocalciferol  (VITAMIN D2) 1.25 MG (50000 UT) capsule Take 50,000 Units by mouth every Saturday. 01/12/21   [provider]  ferrous sulfate 325 (65 FE) MG tablet Take 325 mg by mouth daily with breakfast.    [provider]  FLUoxetine  (PROZAC ) 20 MG capsule Take 1 capsule (20 mg total) by mouth 2 (two) times daily. 04/20/18   Weber, Lauraine CROME, PA-C  furosemide  (LASIX ) 20 MG tablet Take 2 tablets (40 mg total) by mouth daily. 05/23/18 11/01/21  Allen, Lauraine CROME, PA-C  gemfibrozil  (LOPID ) 600 MG tablet Take 1 tablet (600 mg total) by mouth 2 (two) times daily before a meal. 04/18/18   Weber, Lauraine CROME, PA-C  Insulin  Glargine (TOUJEO  SOLOSTAR) 300 UNIT/ML SOPN Inject 50 Units into the skin 2 (two) times daily. 05/23/18   Allen, Lauraine CROME, PA-C  metoCLOPramide  (REGLAN ) 10 MG tablet Take 1 tablet (10 mg total) by mouth every 8 (eight) hours as needed for nausea or vomiting. 11/22/23   Silver Wonda LABOR, PA  metoprolol  tartrate (  LOPRESSOR ) 50 MG tablet Take 1 tablet (50 mg total) by mouth 2 (two) times daily. 04/20/18   Weber, Lauraine CROME, PA-C  metroNIDAZOLE  (FLAGYL ) 500 MG tablet Take 1 tablet (500 mg total) by mouth 3 (three) times daily. 11/22/23   Silver Wonda LABOR, PA  MOUNJARO 7.5 MG/0.5ML Pen Inject into the skin.    [provider]  naloxone [facility]) nasal spray 4 mg/0.1 mL Place 0.4 mg into the nose as needed (accidental overdose). 10/30/19   [provider]  naproxen  (NAPROSYN ) 375 MG tablet Take 1 tablet (375 mg total) by mouth 2 (two) times daily. 12/24/22   Randol Simmonds, MD  NOVOTWIST 32G X 5 MM MISC USE 1 APPLICATION BY  DOES NOT APPLY ROUTE DAILY. 04/20/18   Weber, Lauraine CROME, PA-C  ondansetron  (ZOFRAN ) 4 MG tablet Take 1 tablet (4 mg total) by mouth every 6 (six) hours. 11/29/23   Elnor Hila P, DO  pantoprazole  (PROTONIX ) 40 MG tablet Take 40 mg by mouth 2 (two) times daily. 05/26/21   [provider]  potassium chloride  SA (K-DUR,KLOR-CON ) 20 MEQ tablet Take 2 tablets (40 mEq total) by mouth daily. 04/20/18   Weber, Lauraine CROME, PA-C  progesterone  (PROMETRIUM ) 200 MG capsule Take 200 mg by mouth at bedtime. 10/09/20   [provider]  promethazine  (PHENERGAN ) 25 MG suppository Place 1 suppository (25 mg total) rectally every 6 (six) hours as needed for nausea or vomiting. 11/22/23   Silver Wonda LABOR, PA  Semaglutide, 2 MG/DOSE, (OZEMPIC, 2 MG/DOSE,) 8 MG/3ML SOPN Inject 2 mg into the skin every Wednesday. 01/26/21   [provider]  vitamin B-12 (CYANOCOBALAMIN ) 1000 MCG tablet Take 1,000 mcg by mouth daily.    [provider]  Ranitidine HCl (ZANTAC PO) Take 1 tablet by mouth 2 (two) times daily. Patient uses this medication for heartburn.  06/03/13  [provider]    Allergies: Darvocet [propoxyphene n-acetaminophen ], Lisinopril, Metformin and related, Percocet [oxycodone -acetaminophen ], Robaxin  [methocarbamol ], Wellbutrin [bupropion hcl], Zofran  [ondansetron  hcl], and Contrast media [iodinated contrast media]    Review of Systems  Cardiovascular:  Positive for chest pain.  Gastrointestinal:  Positive for abdominal pain.  Musculoskeletal:  Positive for back pain.    Updated Vital Signs BP (!) 172/92   Pulse 70   Temp 98.4 F (36.9 C) (Oral)   Resp 18   LMP  (LMP Unknown)   SpO2 96%   Physical Exam Vitals and nursing note reviewed.  Constitutional:      General: She is in acute distress (likely secondary to pain).     Appearance: She is well-developed. She is diaphoretic.  HENT:     Head: Normocephalic and atraumatic.  Cardiovascular:     Rate and Rhythm: Normal  rate and regular rhythm.  Pulmonary:     Effort: No respiratory distress.     Breath sounds: No stridor.  Abdominal:     General: There is no distension.     Tenderness: There is no abdominal tenderness.  Musculoskeletal:     Cervical back: Normal range of motion.  Neurological:     Mental Status: She is alert.     (all labs ordered are listed, but only abnormal results are displayed) Labs Reviewed  LIPASE, BLOOD - Abnormal; Notable for the following components:      Result Value   Lipase 99 (*)    All other components within normal limits  COMPREHENSIVE METABOLIC PANEL WITH GFR - Abnormal; Notable for the following components:   Glucose,  Bld 227 (*)    BUN 24 (*)    All other components within normal limits  CBC - Abnormal; Notable for the following components:   RBC 5.48 (*)    Hemoglobin 16.7 (*)    HCT 47.9 (*)    All other components within normal limits  URINALYSIS, ROUTINE W REFLEX MICROSCOPIC - Abnormal; Notable for the following components:   Glucose, UA 100 (*)    Hgb urine dipstick MODERATE (*)    All other components within normal limits  TROPONIN T, HIGH SENSITIVITY    EKG: None  Radiology: CT Angio Chest/Abd/Pel for Dissection W and/or Wo Contrast Result Date: 10/04/2024 EXAM: CTA CHEST, ABDOMEN AND PELVIS WITHOUT AND WITH CONTRAST 10/04/2024 02:13:18 AM TECHNIQUE: CTA of the chest was performed without and with the administration of intravenous contrast. CTA of the abdomen and pelvis was performed without and with the administration of intravenous contrast. 100 mL of iohexol  (OMNIPAQUE ) 350 MG/ML injection was administered. Multiplanar reformatted images are provided for review. MIP images are provided for review. Automated exposure control, iterative reconstruction, and/or weight based adjustment of the mA/kV was utilized to reduce the radiation dose to as low as reasonably achievable. COMPARISON: 11/22/2023 CLINICAL HISTORY: Acute aortic syndrome (AAS)  suspected. FINDINGS: VASCULATURE: AORTA: Scattered aortic atherosclerosis. No acute finding. No abdominal aortic aneurysm. No dissection. PULMONARY ARTERIES: No pulmonary embolism with the limits of this exam. GREAT VESSELS OF AORTIC ARCH: Scattered coronary artery atherosclerosis. No acute finding. No dissection. No arterial occlusion or significant stenosis. CELIAC TRUNK: No acute finding. No occlusion or significant stenosis. SUPERIOR MESENTERIC ARTERY: No acute finding. No occlusion or significant stenosis. INFERIOR MESENTERIC ARTERY: No acute finding. No occlusion or significant stenosis. RENAL ARTERIES: No acute finding. No occlusion or significant stenosis. ILIAC ARTERIES: No acute finding. No occlusion or significant stenosis. CHEST: MEDIASTINUM: Scattered coronary artery atherosclerosis. No mediastinal lymphadenopathy. The heart and pericardium demonstrate no acute abnormality. LUNGS AND PLEURA: The lungs are without acute process. No focal consolidation or pulmonary edema. No evidence of pleural effusion or pneumothorax. THORACIC BONES AND SOFT TISSUES: No acute bone or soft tissue abnormality. ABDOMEN AND PELVIS: LIVER: The liver is unremarkable. GALLBLADDER AND BILE DUCTS: Cholecystectomy. No biliary ductal dilatation. SPLEEN: The spleen is unremarkable. PANCREAS: The pancreas is unremarkable. ADRENAL GLANDS: Bilateral adrenal glands demonstrate no acute abnormality. KIDNEYS, URETERS AND BLADDER: 8 mm right UPJ stone with mild hydronephrosis and slight perinephric stranding. Urinary bladder is unremarkable. GI AND BOWEL: Sigmoid diverticulosis. Stomach and duodenal sweep demonstrate no acute abnormality. There is no bowel obstruction. No abnormal bowel wall thickening or distension. REPRODUCTIVE: Reproductive organs are unremarkable. PERITONEUM AND RETROPERITONEUM: No ascites or free air. LYMPH NODES: No lymphadenopathy. ABDOMINAL BONES AND SOFT TISSUES: Degenerative changes in the lumbar spine and hips  bilaterally. No acute soft tissue abnormality. IMPRESSION: 1. No evidence of acute aortic syndrome. 2. 8 mm right UPJ stone with mild right hydronephrosis and slight perinephric stranding. 3. Coronary artery disease, aortic aclerosis. Electronically signed by: Franky Crease MD MD 10/04/2024 02:34 AM EST RP Workstation: [id number]     Procedures   Medications Ordered in the ED  promethazine  (PHENERGAN ) 6.25 mg/NS 50 mL IVPB (has no administration in time range)  diphenhydrAMINE  (BENADRYL ) injection 25 mg (25 mg Intravenous Given 10/04/24 0159)  HYDROmorphone  (DILAUDID ) injection 1 mg (1 mg Intravenous Given 10/04/24 0200)  iohexol  (OMNIPAQUE ) 350 MG/ML injection 100 mL (100 mLs Intravenous Contrast Given 10/04/24 0205)  ketorolac  (TORADOL ) 30 MG/ML injection 30 mg (30 mg Intravenous  Given 10/04/24 0340)  HYDROmorphone  (DILAUDID ) injection 1 mg (1 mg Intravenous Given 10/04/24 0453)  lactated ringers  bolus 1,000 mL (0 mLs Intravenous Stopped 10/04/24 0647)  oxyCODONE  (Oxy IR/ROXICODONE ) immediate release tablet 10 mg (10 mg Oral Given 10/04/24 0537)                                    Medical Decision Making Amount and/or Complexity of Data Reviewed Labs: ordered. Radiology: ordered.  Risk Prescription drug management.  Eval for dissection, kidney stone, pancreatitis, ruptured PUD. Allergy to contrast but just hives and has had benadryl  previously which helped. Consents to foregoing premedication to ensure timely cta to rule out dissection. Also no h/o CKD and labs over last couple times didn't show any AKI. Will get emergent CTA.   CTA without emergent causes but does show R stone at UPJ junction with hydro and is likely symptomatic and cause of her symptoms. Mild elevation of lpase but not to the point to be concerned for pancreatitis, especially without pancreatitis on ct scan. No UTI. Will treat symptoms for now.   Symptomatically patient is significantly improved over arrival.  Tolerating p.o.   Advised that this size stone lodged where it is is unlikely to pass on its own and she should follow-up with urology although there is no indication for emergent consultation.  Medication sent to your pharmacy.  Patient stable for discharge with strict return precautions.   Final diagnoses:  Kidney stone    ED Discharge Orders          Ordered    morphine  (MSIR) 30 MG tablet  Every 4 hours PRN        10/04/24 0649    ondansetron  (ZOFRAN -ODT) 4 MG disintegrating tablet  Every 8 hours PRN        10/04/24 0649    ibuprofen  (ADVIL ) 400 MG tablet  3 times daily        10/04/24 0649    tamsulosin  (FLOMAX ) 0.4 MG CAPS capsule  Daily        10/04/24 0649    Ambulatory referral to Urology        10/04/24 0649               Francys Bolin, Selinda, MD 10/04/24 [phone]  "

## 2024-10-04 NOTE — ED Notes (Signed)
 Patient transported to CT

## 2024-10-11 ENCOUNTER — Emergency Department (HOSPITAL_BASED_OUTPATIENT_CLINIC_OR_DEPARTMENT_OTHER)

## 2024-10-11 ENCOUNTER — Encounter (HOSPITAL_BASED_OUTPATIENT_CLINIC_OR_DEPARTMENT_OTHER): Payer: Self-pay

## 2024-10-11 ENCOUNTER — Other Ambulatory Visit: Payer: Self-pay

## 2024-10-11 ENCOUNTER — Emergency Department (HOSPITAL_BASED_OUTPATIENT_CLINIC_OR_DEPARTMENT_OTHER)
Admission: EM | Admit: 2024-10-11 | Discharge: 2024-10-11 | Disposition: A | Discharge status: Home / Self Care | Attending: Emergency Medicine | Admitting: Emergency Medicine

## 2024-10-11 DIAGNOSIS — R350 Frequency of micturition: Secondary | ICD-10-CM | POA: Diagnosis not present

## 2024-10-11 DIAGNOSIS — R10A1 Flank pain, right side: Secondary | ICD-10-CM | POA: Diagnosis present

## 2024-10-11 DIAGNOSIS — N200 Calculus of kidney: Secondary | ICD-10-CM | POA: Insufficient documentation

## 2024-10-11 DIAGNOSIS — R079 Chest pain, unspecified: Secondary | ICD-10-CM | POA: Diagnosis not present

## 2024-10-11 LAB — URINALYSIS, ROUTINE W REFLEX MICROSCOPIC
Bacteria, UA: NONE SEEN
Bilirubin Urine: NEGATIVE
Glucose, UA: NEGATIVE mg/dL
Ketones, ur: NEGATIVE mg/dL
Leukocytes,Ua: NEGATIVE
Nitrite: NEGATIVE
Protein, ur: NEGATIVE mg/dL
Specific Gravity, Urine: 1.017 (ref 1.005–1.030)
pH: 5 (ref 5.0–8.0)

## 2024-10-11 LAB — BASIC METABOLIC PANEL WITH GFR
Anion gap: 14 (ref 5–15)
BUN: 23 mg/dL — ABNORMAL HIGH (ref 6–20)
CO2: 24 mmol/L (ref 22–32)
Calcium: 9.9 mg/dL (ref 8.9–10.3)
Chloride: 103 mmol/L (ref 98–111)
Creatinine, Ser: 0.8 mg/dL (ref 0.44–1.00)
GFR, Estimated: >60 mL/min
Glucose, Bld: 190 mg/dL — ABNORMAL HIGH (ref 70–99)
Potassium: 4.1 mmol/L (ref 3.5–5.1)
Sodium: 140 mmol/L (ref 135–145)

## 2024-10-11 LAB — CBC
HCT: 41.2 % (ref 36.0–46.0)
Hemoglobin: 14.4 g/dL (ref 12.0–15.0)
MCH: 30.7 pg (ref 26.0–34.0)
MCHC: 35 g/dL (ref 30.0–36.0)
MCV: 87.8 fL (ref 80.0–100.0)
Platelets: 237 K/uL (ref 150–400)
RBC: 4.69 MIL/uL (ref 3.87–5.11)
RDW: 13.2 % (ref 11.5–15.5)
WBC: 9.1 K/uL (ref 4.0–10.5)
nRBC: 0 % (ref 0.0–0.2)

## 2024-10-11 MED ORDER — SODIUM CHLORIDE 0.9 % IV SOLN
12.5000 mg | Freq: Once | INTRAVENOUS | Status: AC
  Administered 2024-10-11: 12.5 mg via INTRAVENOUS
  Filled 2024-10-11: qty 0.5

## 2024-10-11 MED ORDER — DIPHENHYDRAMINE HCL 50 MG/ML IJ SOLN
25.0000 mg | Freq: Once | INTRAMUSCULAR | Status: AC
  Administered 2024-10-11: 25 mg via INTRAVENOUS
  Filled 2024-10-11: qty 1

## 2024-10-11 MED ORDER — HYDROMORPHONE HCL 1 MG/ML IJ SOLN
1.0000 mg | Freq: Once | INTRAMUSCULAR | Status: AC
  Administered 2024-10-11: 1 mg via INTRAVENOUS
  Filled 2024-10-11: qty 1

## 2024-10-11 MED ORDER — DIAZEPAM 5 MG/ML IJ SOLN
2.5000 mg | Freq: Once | INTRAMUSCULAR | Status: AC
  Administered 2024-10-11: 2.5 mg via INTRAVENOUS
  Filled 2024-10-11: qty 2

## 2024-10-11 MED ORDER — KETOROLAC TROMETHAMINE 15 MG/ML IJ SOLN
15.0000 mg | Freq: Once | INTRAMUSCULAR | Status: AC
  Administered 2024-10-11: 15 mg via INTRAVENOUS
  Filled 2024-10-11: qty 1

## 2024-10-11 MED ORDER — LIDOCAINE HCL (CARDIAC) PF 100 MG/5ML IV SOSY
1.0000 mg/kg | PREFILLED_SYRINGE | Freq: Once | INTRAVENOUS | Status: AC
  Administered 2024-10-11: 47 mg via INTRAVENOUS
  Filled 2024-10-11: qty 5

## 2024-10-11 MED ORDER — SODIUM CHLORIDE 0.9 % IV BOLUS
1000.0000 mL | Freq: Once | INTRAVENOUS | Status: AC
  Administered 2024-10-11: 1000 mL via INTRAVENOUS

## 2024-10-11 MED ORDER — PROMETHAZINE HCL 25 MG PO TABS: 25.0000 mg | ORAL_TABLET | Freq: Four times a day (QID) | ORAL | 0 refills | Status: AC | PRN

## 2024-10-11 MED ORDER — PROMETHAZINE HCL 25 MG/ML IJ SOLN
INTRAMUSCULAR | Status: AC
  Filled 2024-10-11: qty 1

## 2024-10-11 NOTE — ED Provider Notes (Signed)
 " [place] [facility]ERGENCY DEPARTMENT AT [facility] Provider Note   CSN: [id number] Arrival date & time: 10/11/24  0715     Patient presents with: Flank Pain and Chest Pain   [name] is a 59 y.o. female.   59 year old female here today for right sided flank pain and back pain.  States that she feels as though she is passing her 8 mm kidney stone which has already been established.  She saw urology on Friday, was told the plan was to place stent, however it has not yet been scheduled.  She comes in today for worsening pain.  She denies fever or chills.   Flank Pain Associated symptoms include chest pain.  Chest Pain      Prior to Admission medications  Medication Sig Start Date End Date Taking? Authorizing Provider  ketorolac  (TORADOL ) 10 MG tablet Take by mouth. 10/05/24  Yes [provider]  promethazine  (PHENERGAN ) 25 MG tablet Take 1 tablet (25 mg total) by mouth every 6 (six) hours as needed for nausea or vomiting. 10/11/24  Yes Mannie Pac T, DO  albuterol  (PROVENTIL  HFA;VENTOLIN  HFA) 108 (90 Base) MCG/ACT inhaler Inhale 2 puffs into the lungs every 6 (six) hours as needed for wheezing or shortness of breath. 02/28/18   Jerri Keys, MD  allopurinol (ZYLOPRIM) 100 MG tablet Take 100 mg by mouth daily. 11/29/22   [provider]  atorvastatin  (LIPITOR) 40 MG tablet Take 1 tablet (40 mg total) by mouth daily. 04/24/18   Weber, Lauraine CROME, PA-C  BIOTIN PO Take 1 capsule by mouth daily.    [provider]  blood glucose meter kit and supplies KIT by Does not apply route in the morning and at bedtime.    [provider]  Continuous Blood Gluc Sensor (FREESTYLE LIBRE 14 DAY SENSOR) MISC 1 application by Does not apply route every 14 (fourteen) days. 04/20/18   Weber, Lauraine CROME, PA-C  cyclobenzaprine  (FLEXERIL ) 5 MG tablet Take 5 mg by mouth 3 (three) times daily as needed.    [provider]  diazepam  (VALIUM ) 2 MG tablet Take 0.5-1 tablets  (1-2 mg total) every 12 (twelve) hours as needed by mouth for anxiety or muscle spasms. 08/16/17   McVey, Almarie Folks, PA-C  ergocalciferol  (VITAMIN D2) 1.25 MG (50000 UT) capsule Take 50,000 Units by mouth every Saturday. 01/12/21   [provider]  ferrous sulfate 325 (65 FE) MG tablet Take 325 mg by mouth daily with breakfast.    [provider]  FLUoxetine  (PROZAC ) 20 MG capsule Take 1 capsule (20 mg total) by mouth 2 (two) times daily. 04/20/18   Weber, Lauraine CROME, PA-C  furosemide  (LASIX ) 20 MG tablet Take 2 tablets (40 mg total) by mouth daily. 05/23/18 11/01/21  Allen, Lauraine CROME, PA-C  gabapentin (NEURONTIN) 100 MG capsule Take 100 mg by mouth 3 (three) times daily.    [provider]  gemfibrozil  (LOPID ) 600 MG tablet Take 1 tablet (600 mg total) by mouth 2 (two) times daily before a meal. 04/18/18   Weber, Lauraine CROME, PA-C  HYDROcodone -acetaminophen  (NORCO) 10-325 MG tablet Take 0.5 tablets by mouth 2 (two) times daily.    [provider]  ibuprofen  (ADVIL ) 400 MG tablet Take 1 tablet (400 mg total) by mouth 3 (three) times daily for 7 days. 10/04/24 10/11/24  Mesner, Selinda, MD  Insulin  Glargine (TOUJEO  SOLOSTAR) 300 UNIT/ML SOPN Inject 50 Units into the skin 2 (two) times daily. 05/23/18   Allen Lauraine CROME, PA-C  metoCLOPramide  (REGLAN ) 10 MG tablet Take 1 tablet (10 mg total) by mouth every 8 (eight) hours as needed for nausea or vomiting. 11/22/23   Silver Wonda LABOR, PA  metoprolol  tartrate (LOPRESSOR ) 50 MG tablet Take 1 tablet (50 mg total) by mouth 2 (two) times daily. 04/20/18   Weber, Lauraine CROME, PA-C  metroNIDAZOLE  (FLAGYL ) 500 MG tablet Take 1 tablet (500 mg total) by mouth 3 (three) times daily. 11/22/23   Silver Wonda LABOR, PA  morphine  (MSIR) 30 MG tablet Take 0.5 tablets (15 mg total) by mouth every 4 (four) hours as needed for severe pain (pain score 7-10). 10/04/24   Mesner, Jason, MD  MOUNJARO 7.5 MG/0.5ML Pen Inject into the skin.    [provider]   naloxone [facility]) nasal spray 4 mg/0.1 mL Place 0.4 mg into the nose as needed (accidental overdose). 10/30/19   [provider]  naproxen  (NAPROSYN ) 375 MG tablet Take 1 tablet (375 mg total) by mouth 2 (two) times daily. 12/24/22   Randol Simmonds, MD  NOVOTWIST 32G X 5 MM MISC USE 1 APPLICATION BY DOES NOT APPLY ROUTE DAILY. 04/20/18   Weber, Lauraine CROME, PA-C  ondansetron  (ZOFRAN ) 4 MG tablet Take 1 tablet (4 mg total) by mouth every 6 (six) hours. 11/29/23   Elnor Hila P, DO  ondansetron  (ZOFRAN -ODT) 4 MG disintegrating tablet Take 1 tablet (4 mg total) by mouth every 8 (eight) hours as needed for vomiting. 10/04/24   Mesner, Selinda, MD  pantoprazole  (PROTONIX ) 40 MG tablet Take 40 mg by mouth 2 (two) times daily. 05/26/21   [provider]  potassium chloride  SA (K-DUR,KLOR-CON ) 20 MEQ tablet Take 2 tablets (40 mEq total) by mouth daily. 04/20/18   Weber, Lauraine CROME, PA-C  progesterone  (PROMETRIUM ) 200 MG capsule Take 200 mg by mouth at bedtime. 10/09/20   [provider]  Semaglutide, 2 MG/DOSE, (OZEMPIC, 2 MG/DOSE,) 8 MG/3ML SOPN Inject 2 mg into the skin every Wednesday. 01/26/21   [provider]  tamsulosin  (FLOMAX ) 0.4 MG CAPS capsule Take 1 capsule (0.4 mg total) by mouth daily. Until stone passes 10/04/24   Mesner, Selinda, MD  vitamin B-12 (CYANOCOBALAMIN ) 1000 MCG tablet Take 1,000 mcg by mouth daily.    [provider]  Ranitidine HCl (ZANTAC PO) Take 1 tablet by mouth 2 (two) times daily. Patient uses this medication for heartburn.  06/03/13  [provider]    Allergies: Darvocet [propoxyphene n-acetaminophen ], Lisinopril, Metformin and related, Percocet [oxycodone -acetaminophen ], Robaxin  [methocarbamol ], Wellbutrin [bupropion hcl], Zofran  [ondansetron  hcl], and Contrast media [iodinated contrast media]    Review of Systems  Cardiovascular:  Positive for chest pain.  Genitourinary:  Positive for flank pain.    Updated Vital Signs BP (!) 158/77    Pulse 81   Temp 97.9 F (36.6 C) (Oral)   Resp 17   LMP  (LMP Unknown)   SpO2 99%   Physical Exam Vitals and nursing note reviewed.  Constitutional:      General: She is in acute distress.  Cardiovascular:     Rate and Rhythm: Normal rate.     Heart sounds: Normal heart sounds.  Pulmonary:     Effort: Pulmonary effort is normal.  Neurological:     General: No focal deficit present.     Mental Status: She is alert.     (all labs ordered are listed, but only abnormal results are displayed) Labs Reviewed  URINALYSIS, ROUTINE W REFLEX MICROSCOPIC - Abnormal; Notable for the following components:  Result Value   Hgb urine dipstick TRACE (*)    All other components within normal limits  BASIC METABOLIC PANEL WITH GFR - Abnormal; Notable for the following components:   Glucose, Bld 190 (*)    BUN 23 (*)    All other components within normal limits  CBC    EKG: None  Radiology: CT Renal Stone Study Result Date: 10/11/2024 EXAM: CT ABDOMEN AND PELVIS WITHOUT CONTRAST 10/11/2024 08:22:55 AM TECHNIQUE: CT of the abdomen and pelvis was performed without the administration of intravenous contrast. Multiplanar reformatted images are provided for review. Automated exposure control, iterative reconstruction, and/or weight-based adjustment of the mA/kV was utilized to reduce the radiation dose to as low as reasonably achievable. COMPARISON: 10/04/2024 CLINICAL HISTORY: Abdominal/flank pain, stone suspected. FINDINGS: LOWER CHEST: No acute abnormality. LIVER: Mild hepatic steatosis. GALLBLADDER AND BILE DUCTS: Cholecystectomy. No biliary ductal dilatation. SPLEEN: No acute abnormality. PANCREAS: No acute abnormality. ADRENAL GLANDS: No acute abnormality. KIDNEYS, URETERS AND BLADDER: 5 mm right lower pole calyceal calculus. The urinary bladder is decompressed, without focal abnormality. No hydronephrosis. No perinephric or periureteral stranding. GI AND BOWEL: Stomach demonstrates no acute  abnormality. Total colonic diverticulosis, most notably within the sigmoid colon. The appendix was not visualized. No right lower quadrant or pericecal inflammatory stranding to suggest acute appendicitis. There is no bowel obstruction. PERITONEUM AND RETROPERITONEUM: No ascites. No free air. No free pelvic fluid. VASCULATURE: Aorta is normal in caliber. Diffuse aortoiliac atherosclerosis. LYMPH NODES: No lymphadenopathy. REPRODUCTIVE ORGANS: Age related atrophy of the uterus and ovaries. 1.6 cm hypodensity in the left ovary, possibly a residual dominant follicle, unchanged. BONES AND SOFT TISSUES: Multilevel degenerative disc disease of the spine. Thoracic disc. Moderate left and severe right hip osteoarthritis. No acute osseous abnormality. No focal soft tissue abnormality. IMPRESSION: 1. Nonobstructive 5 mm right lower pole calyceal calculus. No hydronephrosis. 2. Total colonic diverticulosis. No changes of acute diverticulitis. Electronically signed by: Rogelia Myers MD 10/11/2024 08:56 AM EST RP Workstation: [id number]     Procedures   Medications Ordered in the ED  promethazine  (PHENERGAN ) 25 MG/ML injection (  Not Given 10/11/24 0831)  promethazine  (PHENERGAN ) 12.5 mg in sodium chloride  0.9 % 50 mL IVPB (0 mg Intravenous Stopped 10/11/24 0849)  HYDROmorphone  (DILAUDID ) injection 1 mg (1 mg Intravenous Given 10/11/24 0803)  ketorolac  (TORADOL ) 15 MG/ML injection 15 mg (15 mg Intravenous Given 10/11/24 0803)  sodium chloride  0.9 % bolus 1,000 mL (0 mLs Intravenous Stopped 10/11/24 0934)  HYDROmorphone  (DILAUDID ) injection 1 mg (1 mg Intravenous Given 10/11/24 0911)  lidocaine  (cardiac) 100 mg/59mL (XYLOCAINE ) injection 2% 47 mg (47 mg Intravenous Given 10/11/24 0939)  diazepam  (VALIUM ) injection 2.5 mg (2.5 mg Intravenous Given 10/11/24 1113)  diphenhydrAMINE  (BENADRYL ) injection 25 mg (25 mg Intravenous Given 10/11/24 1119)                                    Medical Decision Making 59 year old  female here today with right-sided flank pain.  Differential diagnoses include nephrolithiasis, less likely aortic dissection, less likely ACS, less likely bowel perforation.  Plan-patient certainly looks like a kidney stone on exam, and with her known 8 mm stone, this likely is the culprit.  She is having increased urinary frequency.  Will check a urine.  Will provide her with analgesia, obtain CT renal imaging to see if there has been any progression of her stone.  Hopefully were able to get  the patient's symptoms under control here in the ED, if we are unable to do so patient will require admission for pain control.  Reassessment 11:50 AM-remarkably, patient has passed her 8 mm kidney stone.  Did reach out to urology dysuria the good news, and PA [place] reviewed the imaging.  Their team is aware that the patient does not require stent placement tomorrow.  Patient was still having some pain, tried some IV lidocaine  with minimal relief.  Was able to the patient's pain in an adequate place.  She does feel improved from when she arrived.  She already has prescription medications at home aside from nausea medications.  Requesting Phenergan .  Believe this is appropriate.  Will discharge patient.  I considered admission for this patient, however given that she has passed her stone and we are able to get symptomatic relief, she is appropriate for discharge.  Amount and/or Complexity of Data Reviewed Labs: ordered. Radiology: ordered.  Risk Prescription drug management.        Final diagnoses:  Nephrolithiasis    ED Discharge Orders          Ordered    promethazine  (PHENERGAN ) 25 MG tablet  Every 6 hours PRN        10/11/24 1153               Mannie Pac T, DO 10/11/24 1153  "

## 2024-10-11 NOTE — ED Triage Notes (Signed)
 Patient reports right sided flank pain, chest pain, back pain, and abdominal pain. She said I think I am passing an 8mm kidney stone. Seen here for this earlier and also saw urology.

## 2024-10-11 NOTE — Discharge Instructions (Signed)
 Congratulations on passing your 8 mm kidney stone!  You may take 10 mg of Toradol  every 6 hours for the next few days.  I have also sent you some Phenergan  to take as needed for nausea.  Take your prescribed hydrocodone 's as needed for breakthrough pain.  I also recommend taking 1000 mg of Tylenol  every 8 hours.  Follow-up with your primary care doctor.

## 2024-10-11 NOTE — ED Notes (Signed)
 Pt d/c instructions, medications, and follow-up care reviewed with pt. Pt verbalized understanding and had no further questions at time of d/c. Pt CA&Ox4, ambulatory, and in NAD at time of d/c. Pt discharged with family.

## 2024-10-11 NOTE — ED Notes (Signed)
 Pt placed on cardiac monitor prior to lidocaine  admin
